# Patient Record
Sex: Male | Born: 1965 | Race: White | Hispanic: No | Marital: Married | Smoking: Current every day smoker
Health system: Southern US, Community
[De-identification: ages and names within clinical notes are randomized; demographics above are authoritative.]

## PROBLEM LIST (undated history)

## (undated) DIAGNOSIS — T8859XA Other complications of anesthesia, initial encounter: Secondary | ICD-10-CM

## (undated) DIAGNOSIS — N186 End stage renal disease: Secondary | ICD-10-CM

## (undated) DIAGNOSIS — Z992 Dependence on renal dialysis: Secondary | ICD-10-CM

## (undated) DIAGNOSIS — K219 Gastro-esophageal reflux disease without esophagitis: Secondary | ICD-10-CM

## (undated) DIAGNOSIS — T4145XA Adverse effect of unspecified anesthetic, initial encounter: Secondary | ICD-10-CM

## (undated) DIAGNOSIS — I82409 Acute embolism and thrombosis of unspecified deep veins of unspecified lower extremity: Secondary | ICD-10-CM

## (undated) HISTORY — PX: OTHER SURGICAL HISTORY: SHX169

---

## 2010-10-16 ENCOUNTER — Inpatient Hospital Stay (HOSPITAL_COMMUNITY)
Admission: EM | Admit: 2010-10-16 | Discharge: 2010-10-21 | DRG: 584 | Disposition: A | Discharge status: Other Acute Inpatient Hospital | Payer: BC Managed Care – PPO | Attending: Internal Medicine | Admitting: Internal Medicine

## 2010-10-16 ENCOUNTER — Emergency Department (HOSPITAL_COMMUNITY): Payer: BC Managed Care – PPO

## 2010-10-16 DIAGNOSIS — L89209 Pressure ulcer of unspecified hip, unspecified stage: Secondary | ICD-10-CM | POA: Diagnosis present

## 2010-10-16 DIAGNOSIS — F3289 Other specified depressive episodes: Secondary | ICD-10-CM | POA: Diagnosis present

## 2010-10-16 DIAGNOSIS — L02219 Cutaneous abscess of trunk, unspecified: Secondary | ICD-10-CM | POA: Diagnosis present

## 2010-10-16 DIAGNOSIS — L8991 Pressure ulcer of unspecified site, stage 1: Secondary | ICD-10-CM | POA: Diagnosis present

## 2010-10-16 DIAGNOSIS — G8929 Other chronic pain: Secondary | ICD-10-CM | POA: Diagnosis present

## 2010-10-16 DIAGNOSIS — Z22322 Carrier or suspected carrier of Methicillin resistant Staphylococcus aureus: Secondary | ICD-10-CM

## 2010-10-16 DIAGNOSIS — K632 Fistula of intestine: Secondary | ICD-10-CM | POA: Diagnosis present

## 2010-10-16 DIAGNOSIS — E875 Hyperkalemia: Secondary | ICD-10-CM | POA: Diagnosis present

## 2010-10-16 DIAGNOSIS — N186 End stage renal disease: Secondary | ICD-10-CM | POA: Diagnosis present

## 2010-10-16 DIAGNOSIS — A4152 Sepsis due to Pseudomonas: Principal | ICD-10-CM | POA: Diagnosis present

## 2010-10-16 DIAGNOSIS — D631 Anemia in chronic kidney disease: Secondary | ICD-10-CM | POA: Diagnosis present

## 2010-10-16 DIAGNOSIS — N039 Chronic nephritic syndrome with unspecified morphologic changes: Secondary | ICD-10-CM | POA: Diagnosis present

## 2010-10-16 DIAGNOSIS — L89309 Pressure ulcer of unspecified buttock, unspecified stage: Secondary | ICD-10-CM | POA: Diagnosis present

## 2010-10-16 DIAGNOSIS — Z992 Dependence on renal dialysis: Secondary | ICD-10-CM

## 2010-10-16 DIAGNOSIS — K5900 Constipation, unspecified: Secondary | ICD-10-CM | POA: Diagnosis present

## 2010-10-16 DIAGNOSIS — G4700 Insomnia, unspecified: Secondary | ICD-10-CM | POA: Diagnosis present

## 2010-10-16 DIAGNOSIS — L8993 Pressure ulcer of unspecified site, stage 3: Secondary | ICD-10-CM | POA: Diagnosis present

## 2010-10-16 DIAGNOSIS — Z9884 Bariatric surgery status: Secondary | ICD-10-CM

## 2010-10-16 DIAGNOSIS — A419 Sepsis, unspecified organism: Secondary | ICD-10-CM | POA: Diagnosis present

## 2010-10-16 DIAGNOSIS — F329 Major depressive disorder, single episode, unspecified: Secondary | ICD-10-CM | POA: Diagnosis present

## 2010-10-16 DIAGNOSIS — E8809 Other disorders of plasma-protein metabolism, not elsewhere classified: Secondary | ICD-10-CM | POA: Diagnosis present

## 2010-10-16 DIAGNOSIS — R5381 Other malaise: Secondary | ICD-10-CM | POA: Diagnosis present

## 2010-10-16 LAB — HEPATIC FUNCTION PANEL
ALT: 5 U/L (ref 0–53)
Albumin: 2.4 g/dL — ABNORMAL LOW (ref 3.5–5.2)
Alkaline Phosphatase: 75 U/L (ref 39–117)
Total Protein: 7.9 g/dL (ref 6.0–8.3)

## 2010-10-16 LAB — DIFFERENTIAL
Basophils Relative: 0 % (ref 0–1)
Lymphocytes Relative: 18 % (ref 12–46)
Monocytes Relative: 8 % (ref 3–12)
Neutro Abs: 5.9 10*3/uL (ref 1.7–7.7)
Neutrophils Relative %: 71 % (ref 43–77)

## 2010-10-16 LAB — BASIC METABOLIC PANEL
CO2: 34 mEq/L — ABNORMAL HIGH (ref 19–32)
Chloride: 99 mEq/L (ref 96–112)
Creatinine, Ser: 5.46 mg/dL — ABNORMAL HIGH (ref 0.4–1.5)
GFR calc Af Amer: 14 mL/min — ABNORMAL LOW (ref >60)
Glucose, Bld: 87 mg/dL (ref 70–99)
Sodium: 139 mEq/L (ref 135–145)

## 2010-10-16 LAB — CBC
HCT: 30.2 % — ABNORMAL LOW (ref 39.0–52.0)
Hemoglobin: 9.2 g/dL — ABNORMAL LOW (ref 13.0–17.0)
MCH: 28.8 pg (ref 26.0–34.0)
RBC: 3.2 MIL/uL — ABNORMAL LOW (ref 4.22–5.81)

## 2010-10-17 ENCOUNTER — Emergency Department (HOSPITAL_COMMUNITY): Payer: BC Managed Care – PPO

## 2010-10-17 LAB — CBC
Platelets: 234 10*3/uL (ref 150–400)
RDW: 15.8 % — ABNORMAL HIGH (ref 11.5–15.5)
WBC: 10 10*3/uL (ref 4.0–10.5)

## 2010-10-17 LAB — RENAL FUNCTION PANEL
BUN: 33 mg/dL — ABNORMAL HIGH (ref 6–23)
Calcium: 9.1 mg/dL (ref 8.4–10.5)
Glucose, Bld: 130 mg/dL — ABNORMAL HIGH (ref 70–99)
Phosphorus: 5.1 mg/dL — ABNORMAL HIGH (ref 2.3–4.6)
Potassium: 5.6 mEq/L — ABNORMAL HIGH (ref 3.5–5.1)

## 2010-10-17 LAB — MRSA PCR SCREENING: MRSA by PCR: POSITIVE — AB

## 2010-10-17 LAB — IRON AND TIBC: TIBC: 154 ug/dL — ABNORMAL LOW (ref 215–435)

## 2010-10-17 LAB — CARDIAC PANEL(CRET KIN+CKTOT+MB+TROPI)
CK, MB: 1.2 ng/mL (ref 0.3–4.0)
Relative Index: INVALID (ref 0.0–2.5)
Total CK: 12 U/L (ref 7–232)
Troponin I: <0.3 ng/mL (ref <0.30)
Troponin I: <0.3 ng/mL (ref <0.30)

## 2010-10-17 LAB — TROPONIN I: Troponin I: <0.3 ng/mL (ref <0.30)

## 2010-10-17 LAB — CK TOTAL AND CKMB (NOT AT ARMC): CK, MB: 1 ng/mL (ref 0.3–4.0)

## 2010-10-18 ENCOUNTER — Inpatient Hospital Stay (HOSPITAL_COMMUNITY): Payer: BC Managed Care – PPO

## 2010-10-18 LAB — RENAL FUNCTION PANEL
Albumin: 1.9 g/dL — ABNORMAL LOW (ref 3.5–5.2)
Chloride: 103 mEq/L (ref 96–112)
GFR calc Af Amer: 11 mL/min — ABNORMAL LOW (ref >60)
GFR calc non Af Amer: 9 mL/min — ABNORMAL LOW (ref >60)
Potassium: 6 mEq/L — ABNORMAL HIGH (ref 3.5–5.1)
Sodium: 139 mEq/L (ref 135–145)

## 2010-10-18 LAB — CBC
MCV: 93.2 fL (ref 78.0–100.0)
Platelets: 248 10*3/uL (ref 150–400)
RBC: 2.93 MIL/uL — ABNORMAL LOW (ref 4.22–5.81)
WBC: 7.6 10*3/uL (ref 4.0–10.5)

## 2010-10-18 LAB — DIFFERENTIAL
Basophils Absolute: 0 10*3/uL (ref 0.0–0.1)
Basophils Relative: 0 % (ref 0–1)
Eosinophils Absolute: 0.3 10*3/uL (ref 0.0–0.7)
Lymphocytes Relative: 15 % (ref 12–46)
Neutrophils Relative %: 72 % (ref 43–77)

## 2010-10-18 LAB — GLUCOSE, CAPILLARY: Glucose-Capillary: 127 mg/dL — ABNORMAL HIGH (ref 70–99)

## 2010-10-18 LAB — IRON: Iron: 20 ug/dL — ABNORMAL LOW (ref 42–135)

## 2010-10-18 LAB — FERRITIN: Ferritin: 1097 ng/mL — ABNORMAL HIGH (ref 22–322)

## 2010-10-18 LAB — HEPATITIS B SURFACE ANTIGEN: Hepatitis B Surface Ag: NEGATIVE

## 2010-10-19 LAB — CBC
MCH: 29.1 pg (ref 26.0–34.0)
MCV: 95.7 fL (ref 78.0–100.0)
Platelets: 221 10*3/uL (ref 150–400)
RBC: 2.78 MIL/uL — ABNORMAL LOW (ref 4.22–5.81)
RDW: 15.6 % — ABNORMAL HIGH (ref 11.5–15.5)
WBC: 7.7 10*3/uL (ref 4.0–10.5)

## 2010-10-19 LAB — RENAL FUNCTION PANEL
Albumin: 2 g/dL — ABNORMAL LOW (ref 3.5–5.2)
BUN: 24 mg/dL — ABNORMAL HIGH (ref 6–23)
Chloride: 103 mEq/L (ref 96–112)
Creatinine, Ser: 5.2 mg/dL — ABNORMAL HIGH (ref 0.4–1.5)
GFR calc Af Amer: 15 mL/min — ABNORMAL LOW (ref >60)
GFR calc non Af Amer: 12 mL/min — ABNORMAL LOW (ref >60)
Phosphorus: 4.4 mg/dL (ref 2.3–4.6)
Potassium: 4.5 mEq/L (ref 3.5–5.1)

## 2010-10-20 ENCOUNTER — Inpatient Hospital Stay (HOSPITAL_COMMUNITY): Payer: BC Managed Care – PPO

## 2010-10-20 DIAGNOSIS — R5381 Other malaise: Secondary | ICD-10-CM

## 2010-10-20 DIAGNOSIS — E669 Obesity, unspecified: Secondary | ICD-10-CM

## 2010-10-20 LAB — RENAL FUNCTION PANEL
Albumin: 2 g/dL — ABNORMAL LOW (ref 3.5–5.2)
Calcium: 9.2 mg/dL (ref 8.4–10.5)
Creatinine, Ser: 6.18 mg/dL — ABNORMAL HIGH (ref 0.4–1.5)
GFR calc Af Amer: 12 mL/min — ABNORMAL LOW (ref >60)
GFR calc non Af Amer: 10 mL/min — ABNORMAL LOW (ref >60)
Phosphorus: 4.5 mg/dL (ref 2.3–4.6)
Sodium: 139 mEq/L (ref 135–145)

## 2010-10-20 LAB — CBC
MCH: 28.5 pg (ref 26.0–34.0)
MCHC: 30.1 g/dL (ref 30.0–36.0)
Platelets: 233 10*3/uL (ref 150–400)
RDW: 15.5 % (ref 11.5–15.5)

## 2010-10-20 LAB — WOUND CULTURE: Gram Stain: NONE SEEN

## 2010-10-21 LAB — TSH: TSH: 1.488 u[IU]/mL (ref 0.350–4.500)

## 2010-10-23 LAB — CULTURE, BLOOD (ROUTINE X 2)

## 2010-10-23 NOTE — Consult Note (Signed)
NAME:  [name], [name] NO.:  [id number]  MEDICAL RECORD NO.:  [id number]           PATIENT TYPE:  I  LOCATION:  2116                         FACILITY:  [facility]  PHYSICIAN:  Maree Krabbe, M.D.DATE OF BIRTH:  Oct 02, 1965  DATE OF CONSULTATION:  10/17/2010 DATE OF DISCHARGE:                                CONSULTATION   REFERRING PHYSICIAN:  Dr. Lovell Sheehan of [facility].  REASON FOR CONSULTATION:  End-stage renal disease and need for ongoing hemodialysis.  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  He was brought to the [facility] emergency department from [facility] facility with complaints of 3-4 days of progressive chest and abdominal pain.  He described the pain as aching, beginning initially in his lower mid chest and epigastric area but subsequently progressing to his right upper quadrant.  Nursing staff at [facility] apparently appreciated increased drainage from his cholecystostomy tube.  He also reports feelings of warmth and diaphoresis for the past day.  He had been started on cefepime on August 15, 2010, for empiric antibiotic coverage.  While in the [facility] emergency department, he spiked a fever of 100.7.  He was admitted for evaluation of abdominal pain in the setting of fever and borderline low blood pressure.  We were consulted to arrange ongoing hemodialysis for this admission.  PAST MEDICAL HISTORY: 1. Morbid obesity status post gastric sleeve surgery in February 2011,     has reportedly lost approximately 400 pounds since that surgery and     current weight is 202  kg. 2. End-stage renal disease on Monday, Wednesday, Friday hemodialysis. 3. 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  Culture in March 2012     positive for Klebsiella. 4. Percutaneous cholecystostomy. 5. Bilateral decubitus ulcers on hips, left worse than right. 6. Chronic pain. 7. Question of diabetes, not currently on diabetic medications.  ALLERGIES:  No known drug allergies.  MEDICATIONS:  While at [facility]): 1. Senna 2 tablets p.o. q.8 hours. 2. Cefepime 500 mg IV q. 24 hours. 3. Hydromorphone 4 mg p.o. q.4 h. p.r.n. for pain. 4. Darbepoetin alpha 200 mcg subcutaneously weekly. 5. Heparin 8000 units IV push three times weekly. 6. Zinc oxide/menthol one topical application daily and q.12 hours as     needed. 7. Tylenol 1000 mg p.o. q.6 h. P.r.n. 8. Gabapentin 300 mg p.o. q.8 hours. 9. Ferrous sulfate 325 mg p.o. q.12 hours. 10.Clonazepam 0.5 mg p.o. q. 6 hours. 11.Diphenhydramine/lidocaine/antacids 5 mL suspension p.o. t.i.d. 12.Miconazole powder one topical application q. 12 hours p.r.n. 13.Methadone 10 mg p.o. q.12 hours. 14.Zofran 4 mg p.o. q.6 h. P.r.n. 15.Albuterol sulfate 2.5 mg inhaled q.6 h. p.r.n. for wheezing. 16.Sevelamer hydrochloride 800 mg p.o. t.i.d. with meals. 17.Nitroglycerin 0.4 mg sublingual q. 5 minutes p.r.n. for chest pain. 18.Aluminium/magnesium/simethicone 30 mL p.o. q.6  h. p.r.n. 19.Lexapro 10 mg p.o. daily. 20.Famotidine 20 mg p.o. b.i.d. 21.Albumin 25 g IV three times weekly p.r.n.  CURRENT INPATIENT MEDICATIONS:  Lovenox, Dilaudid, Avelox, Zosyn and vancomycin.  SOCIAL HISTORY:  The patient has been living in [facility] for approximately the past year following complicated postoperative course from gastric sleeve surgery.  He is married with four children.  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  Has a 20 pack year smoking history but quit 2 years ago.  Denies alcohol at  this time but reports a remote history of heavy alcohol use.  Denies illicit drug use.  FAMILY HISTORY:  Father had diabetes and end-stage renal disease now deceased.  REVIEW OF SYSTEMS:  Positive for fever, sweats, chronic low back pain, chronic weakness in left foot, chest/epigastric/right upper quadrant pain.  Negative for chills, fatigue, headache, vision changes, shortness of breath, orthopnea, cough, nausea, vomiting, diarrhea or mood changes.  PHYSICAL EXAMINATION:  VITAL SIGNS:  T-max 100.7, blood pressure 101/48, heart rate 85, oxygen saturation 97% on 4 liters nasal cannula. GENERAL APPEARANCE:  Morbidly obese.  No acute distress. CARDIOVASCULAR:  Regular rate and rhythm. PULMONARY:  Clear to auscultation bilaterally. ABDOMEN:  Obese, nontender, positive bowel sounds.  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 EXTREMITIES:  Trace edema left forearm AV fistula with palpable thrill and no erythema. SKIN:  Large decubitus ulcer on left hip with deep central depression surrounding it.  The erythema appears chronic.  No drainage or foul odor.  Small decubitus ulcer on right hip also with chronic-appearing erythema but no drainage or foul odor.  LABORATORY DATA:  Sodium 139, potassium 5.6, chloride 100, bicarb 30, BUN 33, creatinine 6.05, glucose 130, albumin 2.0, calcium 9.1, phosphorus 5.1.  White blood cell count 10.0, hemoglobin 8.6, hematocrit 28.2, platelet count 234.  Total bilirubin 0.2, alkaline phosphatase 75, AST 10, ALT 5, total protein 7.9, albumin 2.4, lipase 9.  Lactic acid 1.1.  Cardiac enzymes negative x2.  TSH 1.624.  IMAGING STUDIES: 1. Portable chest x-ray which demonstrated left basilar airspace     opacity which could be due to atelectasis or pneumonia. 2. Abdominal ultrasound which demonstrated gas/debris in the     gallbladder, hepatosplenomegaly, echogenic kidneys, and small  right     pleural effusion.  ASSESSMENT AND PLAN:  A 45 year old man with morbid obesity status post gastric sleeve surgery in February 2011, with complicated postoperative course now end-stage renal disease on hemodialysis admitted with right upper quadrant abdominal pain/chest pain and fever. 1. Right upper quadrant abdominal pain and fever.  The patient does     not currently appear septic.  However, low systolic blood pressure     in the 90s in the emergency department raised this possibility.     Presentation is concerning for intra-abdominal infection, possibly     related to biliary drain site or enterocutaneous fistula.  However,     due to the patient's large body habitus, this may be difficult to     fully assess.  Blood cultures are pending.  Biliary drainage and     enterocutaneous fistula drainage should likely also be cultured.     Cardiac enzymes are being cycled to rule out acute coronary     syndrome as cause of chest pain.  Cardiac enzymes are currently     negative x2.  Continue broad-spectrum antibiotics. 2. Possible pneumonia. CXR  difficult to interpret given large body habitus      but portable film shows possible L basilar opacity. No clinical      findings strongly suggestive of pneumonia. Currently on broad-spectrum      antibiotics as discussed above. Continue to assess clinically.  3. End-stage renal disease on hemodialysis.  We will resume the     patient's Monday, Wednesday, Friday hemodialysis tomorrow via left     forearm arteriovenous fistula. 4. Decubitus ulcers and enterocutaneous fistulas.  Continue wound care. 5. Chronic pain.  On chronic opiates. Currently treated with caution      given borderline low blood pressure. 6. Disposition.  Primary team is apparently considering transfer to     [facility] where the patient underwent his gastric     sleeve surgery following his stabilization.    ______________________________ Whitney Post, MD   ______________________________ Maree Krabbe, M.D.    EB/MEDQ  D:  10/17/2010  T:  10/18/2010  Job:  [id number]  Electronically Signed by Whitney Post MD on 10/21/2010 02:44:00 PM Electronically Signed by Delano Metz M.D. on 10/23/2010 12:53:31 PM

## 2010-10-27 NOTE — Discharge Summary (Signed)
NAME:  MASSIE, MEES NO.:  [id number]  MEDICAL RECORD NO.:  [id number]           PATIENT TYPE:  I  LOCATION:  6704                         FACILITY:  [facility]  PHYSICIAN:  Rosanna Randy, MDDATE OF BIRTH:  1966/03/20  DATE OF ADMISSION:  10/16/2010 DATE OF DISCHARGE:  10/21/2010                              DISCHARGE SUMMARY   DISCHARGE DIAGNOSES: 1. Early sepsis secondary to Pseudomonas infection. 2. Abdominal pain and fever secondary to early sepsis. 3. End-stage renal disease, on hemodialysis Monday, Wednesday, and     Friday. 4. Anemia of chronic disease with a baseline hemoglobin ranging from     7.8-8.5. 5. Chronic enterocutaneous fistula. 6. Stage III left buttocks/leg ulcer. 7. Stage I right leg/hip pressure ulcer. 8. Positive methicillin-resistant Staphylococcus aureus skin infection     by PCR screening. 9. Morbid obesity with a current weight of 202 kg. 10.Status post sleeve gastric surgery in February 2011. 11.Depression. 12.Chronic pain. 13.Insomnia. 14.Hypoalbuminemia. 15.Hyperkalemia.  DISCHARGE MEDICATIONS: 1. Tylenol 325 mg order to take 2 tablets by mouth every 6 hours as     needed. 2. Dulcolax 10 mg rectally as needed for constipation. 3. Calcium 500 mg by mouth every 6 hours as needed. 4. Ciprofloxacin 500 mg by mouth daily for 10 more days. 5. Darbepoetin 200 mcg/cyclosporine 4 mL injection intravenously every     Monday with dialysis. 6. Lovenox 100 mg subcutaneously daily. 7. Famotidine 20 mg by mouth daily. 8. Ferric gluconate 12.5 mg per mL injection order to take 125 mg     intravenously on Monday, Wednesday, and Friday x10 consecutive     doses and then weekly. 9. Dilaudid 2-3 mg intravenously q.3 h. p.r.n. as needed for pain. 10.Vistaril 25 mg by mouth every hours as needed for agitation and     itching. 11.Lexapro 10 mg by mouth daily. 12.Nepro 237 mL by mouth 3 times a day. 13.Zofran 4 mg intravenously every 6  hours as needed for nausea and     vomiting. 14.Oxycodone 10 mg extended release by mouth q.12 h. 15.Roxicodone 5 mg immediate release tablet by mouth every 4 hours as     needed for breakthrough pain. 16.Sarna anti-itching lotion application topically twice a day on his     decubitus ulcers. 17.Senokot 2 tablets by mouth daily. 18.Renagel 800 mg by mouth 3 times a day with meals. 19.Sorbitol 70% 30 mL by mouth as needed. 20.Zolpidem 5 mg by mouth daily at bedtime as needed for insomnia. 21.The patient had been also discharged with IV line receiving sodium     chloride 0.45% through his vein, 500 mL every 25 hours to keep his     veins open and provide access for IV medications.  DISPOSITION AND FOLLOWUP:  The patient after discussing with his primary surgeon, the patient's request and also family wishes specifically power of attorney, the patient's sister, the decision is to transfer the patient to [facility] where Dr. Shane Crutch is going to look over the patient's cholecystostomy status and is going to decide cholecystectomy when appropriate/discharge cholecystostomy tube.  They will also follow on his gastric sleeve surgery  and his abdominal fistula and if needed, they will also have the ability to perform scans on Mr. Krygier, which is one of the main reasons why the patient is going to be transferred over there since due to his weight, we are unable to perform any at this hospital.  The patient is going to finish his antibiotics for his Pseudomonas infection.  At this point, he is actually stable. No further fevers.  Tolerating his meals and just having mild discomfort in his abdomen.  He will also continue his hemodialysis treatment on Monday, Wednesday, and Friday.  PROCEDURES PERFORMED DURING THIS HOSPITALIZATION:  The patient had a portable chest x-ray on Oct 17, 2010 that demonstrated left basilar opacity, most likely secondary to atelectasis.  The patient also  had ultrasound of the abdomen on Oct 17, 2010 that demonstrated gas/decrease in the gallbladder.  A cholecystostomy tube is not discretely visualized, noting evaluation is constrained by acoustic shadowing. Gallbladder wall at the upper limits of normal measuring 4 mm.  There is hepatosplenomegaly.  Heterogeneous hepatic parenchyma.  Echogenic renal parenchyma likely reflecting medical renal disease.  There is a small right pleural effusion.  No other procedures were performed during this hospitalization.  CONSULTATIONS:  The renal team was consulted during this admission in order to continue the patient's hemodialysis treatment.  BRIEF HISTORY OF PRESENT ILLNESS:  Mr. Ratz is a 45 year old male with a history of morbid obesity status post gastric sleeve procedure which had been performed at [facility] 16 months ago.  The patient is currently prior coming into the hospital in the [facility] for rehabilitation therapy.  He had lost according to the records approximately 400 pounds and still weights approximately 400 pounds at this time.  He has had abdominal pain for the past 3-4 days, which had been worsening in nature.  He began to have pain in the chest as well.  He denies having any nausea or vomiting.  He has a biliary tube which had been draining and the drainage was noted per the staff at the [facility] have an increase in the output with a purulent discharge.  The patient had low-grade fevers prior to arrival and in the emergency department, was found to have a fever of 100.7. The emergency department physician evaluated this patient and contacted to [facility] for transfer of the patient after the evaluation.  However, the on-call physician preferred that the patient remain at [facility] until his condition declares itself as sepsis or not before possible transfer to [facility] when the patient's medical  condition is stable.  For further details on his history of present illness and physical exam on admission, please refer to dictation done by Dr. Lovell Sheehan on Oct 17, 2010.  PERTINENT LABORATORY DATA DONE THROUGHOUT THIS HOSPITALIZATION:  Three sets of cardiac markers which had been negative.  CBC on admission that demonstrated a hemoglobin of 8.8 with a white blood cells of 8.3 and platelets 245.  He also has a BMET with a sodium of 139, potassium 5.6, chloride 99, bicarb 34, glucose 87, BUN 30, creatinine 5.46 with a calcium level of 9.4.  Lipase was 9.  The patient had a hepatic function panel, which was pretty much unremarkable except for low albumin which was 2.4, total bilirubin was 0.2, alkaline phosphatase 75, AST 10, ALT 5, and total protein 7.9.  Lactic acid was 1.1. Procalcitonin level was 0.37.  MRSA PCR screening was positive.  TSH was 1.624.  Blood cultures no growth to date.  Total iron was 20.  Ferritin 1097.  Hepatitis B surface antibody was negative.  At discharge; CBC demonstrated white blood cells of 7.7, hemoglobin 8.1, and platelets 221.  Renal function panel demonstrated a sodium of 139, potassium 5.1, chloride 102, bicarb 31, glucose 89, BUN 27, creatinine 6.18, albumin 2.0, calcium 9.2, and phosphorus 4.5.  This was a predialysis blood work.  HOSPITAL COURSE BY PROBLEM: 1. Early sepsis secondary to Pseudomonas infection as we actually have     the wound culture from biliary tube side that demonstrated     Pseudomonas microorganism sensitive to ciprofloxacin.  The patient     received 4 days of IV antibiotics while he was in the hospital with     a transition to Cipro 500 mg by mouth every 24 hours to complete a     total antibiotic course of 14 days.  The patient is planned to     receive 10 more days of antibiotics by mouth.  At this point, plan     is to transfer the patient to [facility] since his condition     is stable in order to look over his  cholecystostomy tube and decide     cholecystectomy versus removal of his cholecystostomy tube. 2. The patient's abdominal pain and fever secondary to early sepsis.     At this point, no further fever has been appreciated.  Vital signs     remained stable.  Blood pressure also remained stable.  The     patient's abdominal pain has decreased considerably and at this     point is just minimally. 3. End-stage renal disease.  Plan is to continue hemodialysis on     Monday, Wednesday, and Friday. 4. Anemia of chronic disease secondary to his end-stage renal disease.     At this point, the plan is to continue using Aranesp and also we     are going to provide loading doses of IV iron with weekly doses of     IV iron with hemodialysis after. 5. The patient's chronic enterocutaneous fistula which had remained     stable and did not present any drainage at this point. 6. The patient's stage III buttocks/left leg ulcer.  Plan is to     continue with wound care and prevention measures. 7. Positive MRSA infection by PCR.  The patient received treatment     with chlorhexidine and also mupirocin while he was in the hospital. 8. Morbid obesity.  Plan is to continue the patient on his current     diet and he is status post gastric sleeve surgery.  This is     something that will be looked over again by Dr. Shane Crutch once the     patient is at [facility]. 9. The patient's depression.  We are going to continue Lexapro by     mouth daily. 10.The patient's chronic pain.  We are going to continue current pain     regimen which had been to provide good relief of his pain. 11.The patient's insomnia.  We are going to use p.r.n. zolpidem. 12.The patient's hypoalbuminemia.  We are going to continue using     Nepro, especially to try to help with the wound healing process.  At the moment of discharge, the patient's vital signs; temperature 98.2, heart rate 88, respiratory rate 20, blood pressure 106/55,  and oxygen saturation 93% on 2 L.     Arelia Sneddon  Gwenlyn Perking, MD     CEM/MEDQ  D:  10/21/2010  T:  10/21/2010  Job:  [id number]  cc:   Dr. Shane Crutch  Electronically Signed by Vassie Loll MD on 10/27/2010 01:27:23 PM

## 2010-11-04 ENCOUNTER — Inpatient Hospital Stay
Admission: AD | Admit: 2010-11-04 | Discharge: 2010-12-08 | Disposition: A | Discharge status: Home Health | Payer: Self-pay | Source: Ambulatory Visit | Attending: Internal Medicine | Admitting: Internal Medicine

## 2010-11-04 HISTORY — DX: Gastro-esophageal reflux disease without esophagitis: K21.9

## 2010-11-04 HISTORY — DX: Acute embolism and thrombosis of unspecified deep veins of unspecified lower extremity: I82.409

## 2010-11-05 ENCOUNTER — Other Ambulatory Visit (HOSPITAL_COMMUNITY): Payer: Medicare Other | Attending: Internal Medicine

## 2010-11-05 LAB — CBC
MCH: 27.7 pg (ref 26.0–34.0)
MCHC: 29.9 g/dL — ABNORMAL LOW (ref 30.0–36.0)
MCV: 92.6 fL (ref 78.0–100.0)
Platelets: 238 10*3/uL (ref 150–400)
RBC: 3.25 MIL/uL — ABNORMAL LOW (ref 4.22–5.81)
RDW: 15.2 % (ref 11.5–15.5)

## 2010-11-05 LAB — COMPREHENSIVE METABOLIC PANEL
Albumin: 2.2 g/dL — ABNORMAL LOW (ref 3.5–5.2)
BUN: 12 mg/dL (ref 6–23)
Calcium: 9.2 mg/dL (ref 8.4–10.5)
Chloride: 103 mEq/L (ref 96–112)
Creatinine, Ser: 4.06 mg/dL — ABNORMAL HIGH (ref 0.4–1.5)
Total Bilirubin: 0.2 mg/dL — ABNORMAL LOW (ref 0.3–1.2)

## 2010-11-05 LAB — MAGNESIUM: Magnesium: 2.1 mg/dL (ref 1.5–2.5)

## 2010-11-05 LAB — PREALBUMIN: Prealbumin: 11.4 mg/dL — ABNORMAL LOW (ref 17.0–34.0)

## 2010-11-06 NOTE — H&P (Signed)
[name], [name] NO.:  [id number]  MEDICAL RECORD NO.:  [id number]           PATIENT TYPE:  I  LOCATION:  2116                         FACILITY:  [facility]  PHYSICIAN:  Della Goo, M.D. DATE OF BIRTH:  1965-11-13  DATE OF ADMISSION:  10/16/2010 DATE OF DISCHARGE:                             HISTORY & PHYSICAL   PRIMARY CARE PHYSICIAN:  Unassigned.  CHIEF COMPLAINT:  Abdominal pain.  HISTORY OF PRESENT ILLNESS:  This is a 45 year old male with a history of morbid obesity, status post gastric sleeve procedure, which had been performed at [facility]  16 months ago.  The patient is currently an inpatient at the [facility] for rehabilitation therapy.  He has lost more than 400 pounds and weighs approximately 400 pounds at this time.  He has had abdominal pain for the past 3-4 days, which has been worsening.  He began to have pain in the chest as well.  He denies having any nausea or vomiting.  He has a biliary tube, which has been draining and the drainage was noted by staff as having an increase in the output with purulence.  The patient had no fevers prior to arrival in the emergency department; however, he did develop fever to 100.7 in the Emergency Department at [facility].  The emergency department physician evaluated this patient and contacted the [facility] for transfer of the patient after the evaluation.  However, the on-call physician prefer that the patient remain at [facility] until his condition declares itself as sepsis or not before possible transfer to [facility] in the morning.  PAST MEDICAL HISTORY:  History of end-stage renal disease on hemodialysis treatments, morbid obesity.  PAST SURGICAL HISTORY:  History of the gastric sleep procedure and status post biliary drain placement.  MEDICATIONS:  None.  ALLERGIES:  No Known Drug Allergies.  SOCIAL HISTORY:  The  patient is married with 4 kids.  He is a nonsmoker, nondrinker.  No history of illicit drug usage.  FAMILY HISTORY:  Noncontributory.  REVIEW OF SYSTEMS:  Pertinent as mentioned above.  PHYSICAL EXAMINATION:  GENERAL:  This is a morbidly obese 45 year old Caucasian male who is in discomfort but no acute distress. VITAL SIGNS:  Temperature max of 100.7, blood pressure 143/71, heart rate 88, respirations 22, O2 saturations 91-98%. HEENT:  Normocephalic, atraumatic.  Pupils equally round and reactive to light.  Extraocular movements are intact, funduscopic benign.  There is no scleral icterus.  Nares are patent bilaterally.  Oropharynx is clear. NECK:  Supple, full range of motion.  No thyromegaly, adenopathy, jugular venous distention. CARDIOVASCULAR:  Tachycardic rhythm.  No murmurs, gallops or rubs appreciated. LUNGS:  Clear to auscultation bilaterally.  No rales, rhonchi or wheezes. ABDOMEN:  Positive bowel sounds, obese, soft, nontender, nondistended. Biliary drain present in the right upper quadrant area.  There is some oozing around the tubing at the site. EXTREMITIES:  Obese.  There is no cyanosis, clubbing or edema. NEUROLOGIC:  Generalized weakness but otherwise no focal deficits.  LABORATORY STUDIES:  White blood cell count 8.3, hemoglobin 9.2, hematocrit 30.2,  MCV 94.4, platelets 245, neutrophils 71%, lymphocytes 18%.  Sodium 139, potassium 5.6, chloride 99, CO2 34, BUN 30, creatinine 5.46 and glucose 87.  Lipase 9, albumin 2.4, AST 10, ALT 5, lactic acid level 1.1.  Abdominal ultrasound study reveals the indwelling cholecystostomy tube, debris in the gallbladder, gas within the gallbladder due to the biliary tube, gallbladder wall at the upper limits of normal.  Liver is noted as having heterogeneous parenchyma and enlarged.  Pancreas is poorly enlarged.  Spleen is enlarged and left chest x-ray reveals left basilar air space opacity.  ASSESSMENT:  A 45 year old male  being admitted with: 1. Pneumonia. 2. Right upper quadrant abdominal pain. 3. Cellulitis around the right upper quadrant biliary tube site. 4. Chest pain. 5. End-stage renal disease on hemodialysis treatments. 6. Hyperkalemia. 7. Morbid obesity.  PLAN:  The patient will be admitted to step-down ICU.  Cardiac enzymes will be performed.  Blood cultures x2 have also been ordered and a culture of the biliary tube site will also be sent.  The patient has been placed on IV antibiotic therapy of vancomycin, Zosyn and Avelox at this time.  For the elevated potassium level at 5.6, the patient will be administered Kayexalate 15 g p.o. x1 dose.  Gentle IV fluids have also been ordered, and the patient will be placed on DVT prophylaxis.  The dialysis team will be notified of the patient's admission to continue the patient's dialysis treatments.  The patient is a full code.  A phone number was given from the on-call physician at  [facility]  to contact if the patient will need to be transferred to [facility].  The phone number is are code [phone].     Della Goo, M.D.     HJ/MEDQ  D:  10/17/2010  T:  10/17/2010  Job:  [id number]  Electronically Signed by Della Goo M.D. on 11/06/2010 06:21:47 AM

## 2010-11-08 LAB — RENAL FUNCTION PANEL
Albumin: 2.1 g/dL — ABNORMAL LOW (ref 3.5–5.2)
GFR calc Af Amer: 15 mL/min — ABNORMAL LOW (ref >60)
Glucose, Bld: 85 mg/dL (ref 70–99)
Phosphorus: 2.8 mg/dL (ref 2.3–4.6)
Potassium: 3.9 mEq/L (ref 3.5–5.1)
Sodium: 136 mEq/L (ref 135–145)

## 2010-11-08 LAB — CBC
Hemoglobin: 9.2 g/dL — ABNORMAL LOW (ref 13.0–17.0)
MCHC: 30.5 g/dL (ref 30.0–36.0)
RDW: 15.2 % (ref 11.5–15.5)
WBC: 9.1 10*3/uL (ref 4.0–10.5)

## 2010-11-08 LAB — IRON AND TIBC: Iron: 20 ug/dL — ABNORMAL LOW (ref 42–135)

## 2010-11-11 LAB — CBC
Hemoglobin: 9 g/dL — ABNORMAL LOW (ref 13.0–17.0)
MCH: 27.4 pg (ref 26.0–34.0)
MCV: 88.4 fL (ref 78.0–100.0)
RBC: 3.29 MIL/uL — ABNORMAL LOW (ref 4.22–5.81)

## 2010-11-11 LAB — RENAL FUNCTION PANEL
CO2: 31 mEq/L (ref 19–32)
Calcium: 9 mg/dL (ref 8.4–10.5)
Creatinine, Ser: 4.69 mg/dL — ABNORMAL HIGH (ref 0.4–1.5)
GFR calc Af Amer: 17 mL/min — ABNORMAL LOW (ref >60)
Glucose, Bld: 79 mg/dL (ref 70–99)

## 2010-11-12 LAB — RENAL FUNCTION PANEL
CO2: 33 mEq/L — ABNORMAL HIGH (ref 19–32)
GFR calc Af Amer: 16 mL/min — ABNORMAL LOW (ref >60)
Glucose, Bld: 81 mg/dL (ref 70–99)
Phosphorus: 2.7 mg/dL (ref 2.3–4.6)
Potassium: 4.8 mEq/L (ref 3.5–5.1)
Sodium: 135 mEq/L (ref 135–145)

## 2010-11-12 LAB — CBC
HCT: 29.6 % — ABNORMAL LOW (ref 39.0–52.0)
Hemoglobin: 8.9 g/dL — ABNORMAL LOW (ref 13.0–17.0)
MCH: 26.8 pg (ref 26.0–34.0)
MCHC: 30.1 g/dL (ref 30.0–36.0)

## 2010-11-15 LAB — RENAL FUNCTION PANEL
Albumin: 2.1 g/dL — ABNORMAL LOW (ref 3.5–5.2)
BUN: 38 mg/dL — ABNORMAL HIGH (ref 6–23)
Chloride: 98 mEq/L (ref 96–112)
GFR calc Af Amer: 12 mL/min — ABNORMAL LOW (ref >60)
Potassium: 5 mEq/L (ref 3.5–5.1)
Sodium: 136 mEq/L (ref 135–145)

## 2010-11-15 LAB — CBC
HCT: 30 % — ABNORMAL LOW (ref 39.0–52.0)
Hemoglobin: 9 g/dL — ABNORMAL LOW (ref 13.0–17.0)
RDW: 15.1 % (ref 11.5–15.5)
WBC: 7.3 10*3/uL (ref 4.0–10.5)

## 2010-11-17 LAB — CBC
MCV: 87.6 fL (ref 78.0–100.0)
Platelets: 272 10*3/uL (ref 150–400)
RBC: 3.4 MIL/uL — ABNORMAL LOW (ref 4.22–5.81)
WBC: 7.2 10*3/uL (ref 4.0–10.5)

## 2010-11-17 LAB — RENAL FUNCTION PANEL
Albumin: 2.4 g/dL — ABNORMAL LOW (ref 3.5–5.2)
CO2: 32 mEq/L (ref 19–32)
Chloride: 103 mEq/L (ref 96–112)
Creatinine, Ser: 6 mg/dL — ABNORMAL HIGH (ref 0.50–1.35)
GFR calc Af Amer: 13 mL/min — ABNORMAL LOW (ref >60)
GFR calc non Af Amer: 10 mL/min — ABNORMAL LOW (ref >60)
Potassium: 4.2 mEq/L (ref 3.5–5.1)
Sodium: 141 mEq/L (ref 135–145)

## 2010-11-18 ENCOUNTER — Other Ambulatory Visit (HOSPITAL_COMMUNITY): Payer: Medicare Other

## 2010-11-18 ENCOUNTER — Encounter: Payer: Self-pay | Admitting: Radiology

## 2010-11-18 ENCOUNTER — Other Ambulatory Visit (HOSPITAL_COMMUNITY): Payer: Medicare Other | Attending: Internal Medicine

## 2010-11-19 DIAGNOSIS — F4320 Adjustment disorder, unspecified: Secondary | ICD-10-CM

## 2010-11-19 LAB — CBC
Hemoglobin: 9.2 g/dL — ABNORMAL LOW (ref 13.0–17.0)
Platelets: 243 10*3/uL (ref 150–400)
RBC: 3.4 MIL/uL — ABNORMAL LOW (ref 4.22–5.81)
WBC: 7.3 10*3/uL (ref 4.0–10.5)

## 2010-11-19 LAB — RENAL FUNCTION PANEL
CO2: 35 mEq/L — ABNORMAL HIGH (ref 19–32)
Chloride: 97 mEq/L (ref 96–112)
GFR calc Af Amer: 13 mL/min — ABNORMAL LOW (ref >60)
GFR calc non Af Amer: 11 mL/min — ABNORMAL LOW (ref >60)
Glucose, Bld: 80 mg/dL (ref 70–99)
Potassium: 4.1 mEq/L (ref 3.5–5.1)
Sodium: 137 mEq/L (ref 135–145)

## 2010-11-22 LAB — RENAL FUNCTION PANEL
Albumin: 2.5 g/dL — ABNORMAL LOW (ref 3.5–5.2)
BUN: 33 mg/dL — ABNORMAL HIGH (ref 6–23)
Chloride: 96 mEq/L (ref 96–112)
GFR calc Af Amer: 12 mL/min — ABNORMAL LOW (ref >60)
Glucose, Bld: 159 mg/dL — ABNORMAL HIGH (ref 70–99)
Phosphorus: 3.3 mg/dL (ref 2.3–4.6)
Potassium: 4.2 mEq/L (ref 3.5–5.1)
Sodium: 135 mEq/L (ref 135–145)

## 2010-11-22 LAB — CBC
HCT: 31.4 % — ABNORMAL LOW (ref 39.0–52.0)
Hemoglobin: 9.9 g/dL — ABNORMAL LOW (ref 13.0–17.0)
MCHC: 31.5 g/dL (ref 30.0–36.0)
RDW: 15.5 % (ref 11.5–15.5)
WBC: 7.6 10*3/uL (ref 4.0–10.5)

## 2010-11-22 LAB — FERRITIN: Ferritin: 608 ng/mL — ABNORMAL HIGH (ref 22–322)

## 2010-11-22 LAB — IRON AND TIBC
Iron: 36 ug/dL — ABNORMAL LOW (ref 42–135)
TIBC: 165 ug/dL — ABNORMAL LOW (ref 215–435)

## 2010-11-24 LAB — PTH-RELATED PEPTIDE

## 2010-11-24 LAB — RENAL FUNCTION PANEL
BUN: 38 mg/dL — ABNORMAL HIGH (ref 6–23)
CO2: 32 mEq/L (ref 19–32)
Calcium: 9.6 mg/dL (ref 8.4–10.5)
Creatinine, Ser: 6.79 mg/dL — ABNORMAL HIGH (ref 0.50–1.35)
Glucose, Bld: 82 mg/dL (ref 70–99)
Phosphorus: 3.2 mg/dL (ref 2.3–4.6)

## 2010-11-24 LAB — CBC
Hemoglobin: 9.4 g/dL — ABNORMAL LOW (ref 13.0–17.0)
MCH: 27.2 pg (ref 26.0–34.0)
MCHC: 31.2 g/dL (ref 30.0–36.0)
MCV: 87.2 fL (ref 78.0–100.0)
RBC: 3.45 MIL/uL — ABNORMAL LOW (ref 4.22–5.81)

## 2010-11-24 LAB — CROSSMATCH: Antibody Screen: NEGATIVE

## 2010-11-24 LAB — ABO/RH: ABO/RH(D): O NEG

## 2010-11-25 ENCOUNTER — Inpatient Hospital Stay (HOSPITAL_COMMUNITY)
Admit: 2010-11-25 | Discharge: 2010-11-25 | Disposition: A | Discharge status: Home / Self Care | Payer: Self-pay | Attending: Internal Medicine | Admitting: Internal Medicine

## 2010-11-25 DIAGNOSIS — M79609 Pain in unspecified limb: Secondary | ICD-10-CM

## 2010-11-25 LAB — HEPATITIS B SURFACE ANTIGEN: Hepatitis B Surface Ag: NEGATIVE

## 2010-11-25 LAB — HEPATITIS B SURFACE ANTIBODY,QUALITATIVE: Hep B S Ab: NEGATIVE

## 2010-11-26 ENCOUNTER — Other Ambulatory Visit (HOSPITAL_COMMUNITY): Payer: Medicare Other | Attending: Internal Medicine

## 2010-11-26 ENCOUNTER — Other Ambulatory Visit (HOSPITAL_COMMUNITY): Payer: Medicare Other

## 2010-11-26 LAB — RENAL FUNCTION PANEL
Albumin: 2.5 g/dL — ABNORMAL LOW (ref 3.5–5.2)
BUN: 32 mg/dL — ABNORMAL HIGH (ref 6–23)
Calcium: 9.2 mg/dL (ref 8.4–10.5)
Creatinine, Ser: 6.48 mg/dL — ABNORMAL HIGH (ref 0.50–1.35)
Phosphorus: 2.7 mg/dL (ref 2.3–4.6)
Potassium: 4.2 mEq/L (ref 3.5–5.1)

## 2010-11-26 LAB — CBC
HCT: 27.2 % — ABNORMAL LOW (ref 39.0–52.0)
MCH: 27.1 pg (ref 26.0–34.0)
MCHC: 31.6 g/dL (ref 30.0–36.0)
MCV: 85.8 fL (ref 78.0–100.0)
RDW: 16.1 % — ABNORMAL HIGH (ref 11.5–15.5)

## 2010-11-27 ENCOUNTER — Other Ambulatory Visit (HOSPITAL_COMMUNITY): Payer: Medicare Other | Attending: Internal Medicine

## 2010-11-27 LAB — CBC
Hemoglobin: 9.3 g/dL — ABNORMAL LOW (ref 13.0–17.0)
MCH: 26.6 pg (ref 26.0–34.0)
MCV: 86.3 fL (ref 78.0–100.0)
RBC: 3.5 MIL/uL — ABNORMAL LOW (ref 4.22–5.81)
WBC: 10.7 10*3/uL — ABNORMAL HIGH (ref 4.0–10.5)

## 2010-11-27 LAB — DIFFERENTIAL
Lymphs Abs: 1.9 10*3/uL (ref 0.7–4.0)
Monocytes Relative: 6 % (ref 3–12)
Neutro Abs: 7.9 10*3/uL — ABNORMAL HIGH (ref 1.7–7.7)
Neutrophils Relative %: 74 % (ref 43–77)

## 2010-11-27 LAB — URINALYSIS, ROUTINE W REFLEX MICROSCOPIC
Glucose, UA: NEGATIVE mg/dL
Hgb urine dipstick: NEGATIVE
Ketones, ur: 15 mg/dL — AB
Protein, ur: 100 mg/dL — AB

## 2010-11-27 LAB — URINE MICROSCOPIC-ADD ON

## 2010-11-28 LAB — COMPREHENSIVE METABOLIC PANEL
ALT: 104 U/L — ABNORMAL HIGH (ref 0–53)
AST: 71 U/L — ABNORMAL HIGH (ref 0–37)
Calcium: 9.4 mg/dL (ref 8.4–10.5)
Chloride: 93 mEq/L — ABNORMAL LOW (ref 96–112)
GFR calc non Af Amer: 10 mL/min — ABNORMAL LOW (ref >60)
Glucose, Bld: 93 mg/dL (ref 70–99)
Potassium: 4.2 mEq/L (ref 3.5–5.1)
Total Protein: 8.2 g/dL (ref 6.0–8.3)

## 2010-11-28 LAB — CBC
HCT: 29.5 % — ABNORMAL LOW (ref 39.0–52.0)
Hemoglobin: 9.1 g/dL — ABNORMAL LOW (ref 13.0–17.0)
MCHC: 30.8 g/dL (ref 30.0–36.0)
MCV: 85.5 fL (ref 78.0–100.0)
RDW: 16.3 % — ABNORMAL HIGH (ref 11.5–15.5)
WBC: 9.8 10*3/uL (ref 4.0–10.5)

## 2010-11-29 LAB — RENAL FUNCTION PANEL
Albumin: 2.5 g/dL — ABNORMAL LOW (ref 3.5–5.2)
GFR calc Af Amer: 10 mL/min — ABNORMAL LOW (ref >60)
Phosphorus: 3.7 mg/dL (ref 2.3–4.6)
Potassium: 4.5 mEq/L (ref 3.5–5.1)
Sodium: 132 mEq/L — ABNORMAL LOW (ref 135–145)

## 2010-11-29 LAB — CBC
MCHC: 31.8 g/dL (ref 30.0–36.0)
RDW: 16.4 % — ABNORMAL HIGH (ref 11.5–15.5)

## 2010-11-30 LAB — HEPATITIS B SURFACE ANTIGEN: Hepatitis B Surface Ag: NEGATIVE

## 2010-12-01 LAB — CBC
HCT: 27.9 % — ABNORMAL LOW (ref 39.0–52.0)
MCHC: 30.5 g/dL (ref 30.0–36.0)
RDW: 16.4 % — ABNORMAL HIGH (ref 11.5–15.5)

## 2010-12-01 LAB — URINE CULTURE
Culture  Setup Time: 201206301148
Special Requests: NEGATIVE

## 2010-12-01 LAB — RENAL FUNCTION PANEL
Albumin: 2.3 g/dL — ABNORMAL LOW (ref 3.5–5.2)
BUN: 37 mg/dL — ABNORMAL HIGH (ref 6–23)
Calcium: 9.3 mg/dL (ref 8.4–10.5)
Phosphorus: 2.6 mg/dL (ref 2.3–4.6)
Potassium: 4.4 mEq/L (ref 3.5–5.1)
Sodium: 136 mEq/L (ref 135–145)

## 2010-12-03 LAB — RENAL FUNCTION PANEL
Albumin: 2.2 g/dL — ABNORMAL LOW (ref 3.5–5.2)
Calcium: 9.3 mg/dL (ref 8.4–10.5)
Chloride: 97 mEq/L (ref 96–112)
Creatinine, Ser: 5.42 mg/dL — ABNORMAL HIGH (ref 0.50–1.35)
GFR calc non Af Amer: 12 mL/min — ABNORMAL LOW (ref >60)
Phosphorus: 2.1 mg/dL — ABNORMAL LOW (ref 2.3–4.6)

## 2010-12-03 LAB — CBC
MCHC: 30.5 g/dL (ref 30.0–36.0)
MCV: 87.2 fL (ref 78.0–100.0)
Platelets: 231 10*3/uL (ref 150–400)
RDW: 16.3 % — ABNORMAL HIGH (ref 11.5–15.5)
WBC: 7.9 10*3/uL (ref 4.0–10.5)

## 2010-12-04 ENCOUNTER — Other Ambulatory Visit (HOSPITAL_COMMUNITY): Payer: Medicare Other | Attending: Internal Medicine

## 2010-12-06 LAB — RENAL FUNCTION PANEL
Albumin: 2.2 g/dL — ABNORMAL LOW (ref 3.5–5.2)
Calcium: 9.5 mg/dL (ref 8.4–10.5)
Creatinine, Ser: 6.15 mg/dL — ABNORMAL HIGH (ref 0.50–1.35)
GFR calc Af Amer: 12 mL/min — ABNORMAL LOW (ref >60)
GFR calc non Af Amer: 10 mL/min — ABNORMAL LOW (ref >60)
Phosphorus: 2.9 mg/dL (ref 2.3–4.6)
Sodium: 135 mEq/L (ref 135–145)

## 2010-12-06 LAB — CBC
MCH: 26.7 pg (ref 26.0–34.0)
MCHC: 30.7 g/dL (ref 30.0–36.0)
Platelets: 280 10*3/uL (ref 150–400)
RBC: 3.03 MIL/uL — ABNORMAL LOW (ref 4.22–5.81)
RDW: 16.4 % — ABNORMAL HIGH (ref 11.5–15.5)

## 2010-12-07 ENCOUNTER — Institutional Professional Consult (permissible substitution) (HOSPITAL_COMMUNITY): Payer: Medicare Other

## 2010-12-07 LAB — COMPREHENSIVE METABOLIC PANEL
AST: 9 U/L (ref 0–37)
Albumin: 2.2 g/dL — ABNORMAL LOW (ref 3.5–5.2)
BUN: 23 mg/dL (ref 6–23)
Calcium: 9.1 mg/dL (ref 8.4–10.5)
Creatinine, Ser: 4.18 mg/dL — ABNORMAL HIGH (ref 0.50–1.35)
Total Protein: 7.6 g/dL (ref 6.0–8.3)

## 2010-12-07 LAB — CBC
HCT: 27 % — ABNORMAL LOW (ref 39.0–52.0)
MCH: 26.8 pg (ref 26.0–34.0)
MCV: 86 fL (ref 78.0–100.0)
Platelets: 270 10*3/uL (ref 150–400)
RDW: 16.6 % — ABNORMAL HIGH (ref 11.5–15.5)

## 2010-12-07 LAB — PROTIME-INR
INR: 1.16 (ref 0.00–1.49)
Prothrombin Time: 15 seconds (ref 11.6–15.2)

## 2010-12-07 LAB — APTT: aPTT: 37 seconds (ref 24–37)

## 2010-12-07 MED ORDER — IOHEXOL 300 MG/ML  SOLN: 12.0000 mL | Freq: Once | INTRAMUSCULAR | Status: DC | PRN

## 2010-12-07 MED ORDER — IOHEXOL 300 MG/ML  SOLN: 30.0000 mL | Freq: Once | INTRAMUSCULAR | Status: AC | PRN

## 2010-12-08 LAB — RENAL FUNCTION PANEL
CO2: 32 mEq/L (ref 19–32)
Calcium: 9.4 mg/dL (ref 8.4–10.5)
Chloride: 94 mEq/L — ABNORMAL LOW (ref 96–112)
Creatinine, Ser: 5.39 mg/dL — ABNORMAL HIGH (ref 0.50–1.35)
GFR calc Af Amer: 14 mL/min — ABNORMAL LOW (ref >60)
GFR calc non Af Amer: 12 mL/min — ABNORMAL LOW (ref >60)
Glucose, Bld: 91 mg/dL (ref 70–99)
Sodium: 135 mEq/L (ref 135–145)

## 2010-12-08 LAB — CBC
Hemoglobin: 8.2 g/dL — ABNORMAL LOW (ref 13.0–17.0)
MCH: 26.5 pg (ref 26.0–34.0)
MCV: 86.4 fL (ref 78.0–100.0)
RBC: 3.09 MIL/uL — ABNORMAL LOW (ref 4.22–5.81)
WBC: 8.1 10*3/uL (ref 4.0–10.5)

## 2011-08-24 ENCOUNTER — Encounter (HOSPITAL_BASED_OUTPATIENT_CLINIC_OR_DEPARTMENT_OTHER): Payer: Self-pay | Admitting: Family Medicine

## 2011-08-24 ENCOUNTER — Emergency Department (INDEPENDENT_AMBULATORY_CARE_PROVIDER_SITE_OTHER): Payer: BC Managed Care – PPO

## 2011-08-24 ENCOUNTER — Inpatient Hospital Stay (HOSPITAL_BASED_OUTPATIENT_CLINIC_OR_DEPARTMENT_OTHER)
Admission: EM | Admit: 2011-08-24 | Discharge: 2011-09-01 | DRG: 210 | Payer: BC Managed Care – PPO | Attending: Internal Medicine | Admitting: Internal Medicine

## 2011-08-24 DIAGNOSIS — N186 End stage renal disease: Secondary | ICD-10-CM

## 2011-08-24 DIAGNOSIS — K219 Gastro-esophageal reflux disease without esophagitis: Secondary | ICD-10-CM | POA: Diagnosis present

## 2011-08-24 DIAGNOSIS — N2581 Secondary hyperparathyroidism of renal origin: Secondary | ICD-10-CM | POA: Diagnosis present

## 2011-08-24 DIAGNOSIS — Z992 Dependence on renal dialysis: Secondary | ICD-10-CM

## 2011-08-24 DIAGNOSIS — Y9301 Activity, walking, marching and hiking: Secondary | ICD-10-CM

## 2011-08-24 DIAGNOSIS — I10 Essential (primary) hypertension: Secondary | ICD-10-CM | POA: Diagnosis present

## 2011-08-24 DIAGNOSIS — S72143A Displaced intertrochanteric fracture of unspecified femur, initial encounter for closed fracture: Principal | ICD-10-CM | POA: Diagnosis present

## 2011-08-24 DIAGNOSIS — G4733 Obstructive sleep apnea (adult) (pediatric): Secondary | ICD-10-CM | POA: Diagnosis present

## 2011-08-24 DIAGNOSIS — D631 Anemia in chronic kidney disease: Secondary | ICD-10-CM | POA: Diagnosis present

## 2011-08-24 DIAGNOSIS — I120 Hypertensive chronic kidney disease with stage 5 chronic kidney disease or end stage renal disease: Secondary | ICD-10-CM | POA: Diagnosis present

## 2011-08-24 DIAGNOSIS — J9819 Other pulmonary collapse: Secondary | ICD-10-CM

## 2011-08-24 DIAGNOSIS — J811 Chronic pulmonary edema: Secondary | ICD-10-CM

## 2011-08-24 DIAGNOSIS — I82409 Acute embolism and thrombosis of unspecified deep veins of unspecified lower extremity: Secondary | ICD-10-CM | POA: Diagnosis present

## 2011-08-24 DIAGNOSIS — W19XXXA Unspecified fall, initial encounter: Secondary | ICD-10-CM

## 2011-08-24 DIAGNOSIS — M216X9 Other acquired deformities of unspecified foot: Secondary | ICD-10-CM | POA: Diagnosis present

## 2011-08-24 DIAGNOSIS — Z7901 Long term (current) use of anticoagulants: Secondary | ICD-10-CM

## 2011-08-24 DIAGNOSIS — M899 Disorder of bone, unspecified: Secondary | ICD-10-CM | POA: Diagnosis present

## 2011-08-24 DIAGNOSIS — Z01818 Encounter for other preprocedural examination: Secondary | ICD-10-CM

## 2011-08-24 DIAGNOSIS — Y998 Other external cause status: Secondary | ICD-10-CM

## 2011-08-24 DIAGNOSIS — Z6841 Body Mass Index (BMI) 40.0 and over, adult: Secondary | ICD-10-CM

## 2011-08-24 DIAGNOSIS — I82509 Chronic embolism and thrombosis of unspecified deep veins of unspecified lower extremity: Secondary | ICD-10-CM | POA: Diagnosis present

## 2011-08-24 DIAGNOSIS — E119 Type 2 diabetes mellitus without complications: Secondary | ICD-10-CM | POA: Diagnosis present

## 2011-08-24 DIAGNOSIS — N19 Unspecified kidney failure: Secondary | ICD-10-CM

## 2011-08-24 DIAGNOSIS — D62 Acute posthemorrhagic anemia: Secondary | ICD-10-CM | POA: Diagnosis not present

## 2011-08-24 HISTORY — DX: Adverse effect of unspecified anesthetic, initial encounter: T41.45XA

## 2011-08-24 HISTORY — DX: End stage renal disease: N18.6

## 2011-08-24 HISTORY — DX: Other complications of anesthesia, initial encounter: T88.59XA

## 2011-08-24 LAB — DIFFERENTIAL
Band Neutrophils: 4 % (ref 0–10)
Basophils Absolute: 0 10*3/uL (ref 0.0–0.1)
Basophils Relative: 0 % (ref 0–1)
Eosinophils Absolute: 0 10*3/uL (ref 0.0–0.7)
Eosinophils Relative: 0 % (ref 0–5)
Lymphocytes Relative: 13 % (ref 12–46)
Lymphs Abs: 1.5 10*3/uL (ref 0.7–4.0)
Monocytes Absolute: 0.7 10*3/uL (ref 0.1–1.0)
Neutro Abs: 9 10*3/uL — ABNORMAL HIGH (ref 1.7–7.7)
Neutrophils Relative %: 74 % (ref 43–77)
Promyelocytes Absolute: 0 %

## 2011-08-24 LAB — MRSA PCR SCREENING: MRSA by PCR: NEGATIVE

## 2011-08-24 LAB — BASIC METABOLIC PANEL
BUN: 39 mg/dL — ABNORMAL HIGH (ref 6–23)
Calcium: 9.5 mg/dL (ref 8.4–10.5)
Chloride: 97 mEq/L (ref 96–112)
Creatinine, Ser: 4.6 mg/dL — ABNORMAL HIGH (ref 0.50–1.35)
GFR calc Af Amer: 17 mL/min — ABNORMAL LOW (ref >90)
GFR calc non Af Amer: 14 mL/min — ABNORMAL LOW (ref >90)

## 2011-08-24 LAB — CBC
HCT: 36.9 % — ABNORMAL LOW (ref 39.0–52.0)
Hemoglobin: 12 g/dL — ABNORMAL LOW (ref 13.0–17.0)
MCHC: 32.5 g/dL (ref 30.0–36.0)
RBC: 3.76 MIL/uL — ABNORMAL LOW (ref 4.22–5.81)

## 2011-08-24 LAB — GLUCOSE, CAPILLARY

## 2011-08-24 LAB — PROTIME-INR: Prothrombin Time: 32.6 seconds — ABNORMAL HIGH (ref 11.6–15.2)

## 2011-08-24 MED ORDER — HYDROMORPHONE 0.3 MG/ML IV SOLN
INTRAVENOUS | Status: AC
  Filled 2011-08-24: qty 25

## 2011-08-24 MED ORDER — FOLIC ACID-VIT B6-VIT B12 2.5-25-1 MG PO TABS
1.0000 | ORAL_TABLET | Freq: Every day | ORAL | Status: DC
  Administered 2011-08-25 – 2011-08-26 (×2): 1 via ORAL
  Filled 2011-08-24 (×4): qty 1

## 2011-08-24 MED ORDER — ALPRAZOLAM 0.5 MG PO TABS
1.0000 mg | ORAL_TABLET | Freq: Three times a day (TID) | ORAL | Status: DC | PRN
  Administered 2011-08-26 – 2011-08-28 (×3): 1 mg via ORAL
  Filled 2011-08-24 (×3): qty 2

## 2011-08-24 MED ORDER — HYDROMORPHONE HCL PF 1 MG/ML IJ SOLN
1.0000 mg | INTRAMUSCULAR | Status: DC | PRN
  Administered 2011-08-24 (×4): 1 mg via INTRAVENOUS
  Filled 2011-08-24 (×4): qty 1

## 2011-08-24 MED ORDER — SEVELAMER CARBONATE 800 MG PO TABS
1600.0000 mg | ORAL_TABLET | Freq: Three times a day (TID) | ORAL | Status: DC
  Administered 2011-08-25 – 2011-09-01 (×17): 1600 mg via ORAL
  Filled 2011-08-24 (×26): qty 2

## 2011-08-24 MED ORDER — PANTOPRAZOLE SODIUM 40 MG PO TBEC
40.0000 mg | DELAYED_RELEASE_TABLET | Freq: Every day | ORAL | Status: DC
  Administered 2011-08-25 – 2011-09-01 (×8): 40 mg via ORAL
  Filled 2011-08-24 (×8): qty 1

## 2011-08-24 MED ORDER — HYDROMORPHONE HCL PF 1 MG/ML IJ SOLN
1.0000 mg | Freq: Once | INTRAMUSCULAR | Status: AC
  Administered 2011-08-24: 1 mg via INTRAVENOUS
  Filled 2011-08-24: qty 1

## 2011-08-24 MED ORDER — SODIUM CHLORIDE 0.9 % IJ SOLN: 9.0000 mL | INTRAMUSCULAR | Status: DC | PRN

## 2011-08-24 MED ORDER — HYDROCODONE-ACETAMINOPHEN 5-325 MG PO TABS: 1.0000 | ORAL_TABLET | ORAL | Status: DC | PRN

## 2011-08-24 MED ORDER — ESCITALOPRAM OXALATE 20 MG PO TABS
20.0000 mg | ORAL_TABLET | Freq: Every day | ORAL | Status: DC
  Administered 2011-08-25 – 2011-09-01 (×8): 20 mg via ORAL
  Filled 2011-08-24 (×8): qty 1

## 2011-08-24 MED ORDER — HYDROMORPHONE 0.3 MG/ML IV SOLN
INTRAVENOUS | Status: DC
  Administered 2011-08-24: 0.9 mg via INTRAVENOUS
  Administered 2011-08-24: 22:00:00 via INTRAVENOUS
  Administered 2011-08-25: 1.2 mg via INTRAVENOUS
  Administered 2011-08-25 (×2): via INTRAVENOUS
  Administered 2011-08-25: 3 mg via INTRAVENOUS
  Administered 2011-08-26: 22:00:00 via INTRAVENOUS
  Administered 2011-08-26: 4.15 mg via INTRAVENOUS
  Administered 2011-08-26: 4.7 mg via INTRAVENOUS
  Administered 2011-08-26: 3.9 mg via INTRAVENOUS
  Administered 2011-08-26 (×2): via INTRAVENOUS
  Administered 2011-08-26: 4.5 mg via INTRAVENOUS
  Administered 2011-08-27: 3 mg via INTRAVENOUS

## 2011-08-24 MED ORDER — PHYTONADIONE 5 MG PO TABS
5.0000 mg | ORAL_TABLET | Freq: Once | ORAL | Status: AC
  Administered 2011-08-24: 5 mg via ORAL
  Filled 2011-08-24: qty 1

## 2011-08-24 MED ORDER — NALOXONE HCL 0.4 MG/ML IJ SOLN: 0.4000 mg | INTRAMUSCULAR | Status: DC | PRN

## 2011-08-24 MED ORDER — MORPHINE SULFATE 2 MG/ML IJ SOLN: 2.0000 mg | INTRAMUSCULAR | Status: DC | PRN

## 2011-08-24 MED ORDER — SENNA 8.6 MG PO TABS
1.0000 | ORAL_TABLET | Freq: Two times a day (BID) | ORAL | Status: DC
  Administered 2011-08-25 – 2011-08-28 (×7): 8.6 mg via ORAL
  Filled 2011-08-24 (×9): qty 1

## 2011-08-24 NOTE — ED Provider Notes (Signed)
History     CSN: [id number]  Arrival date & time 08/24/11  1008   First MD Initiated Contact with Patient 08/24/11 1008      Chief Complaint  Patient presents with  . Fall     Patient is a 46 y.o. male presenting with fall. The history is provided by the patient and the EMS personnel.  Fall The accident occurred 1 to 2 hours ago. The fall occurred while walking. The point of impact was the left hip. The pain is present in the left hip. The pain is moderate. Pertinent negatives include no numbness, no abdominal pain, no vomiting, no headaches and no loss of consciousness. The symptoms are aggravated by activity. Treatment on scene includes a backboard. He has tried immobilization for the symptoms. The treatment provided no relief.  Pt reports he fell today while walking He reports he was using his walker, and he lost his balance and fell on his left side No LOC No head injury No neck pain No new weakness No abd pain  no vomiting No CP/SOB/Dizziness He reports most of his pain is in his left hip No low back pain He was immoblized by EMS for transport only  He is on Dialysis, last treatment yesterday (tues/thursday/saturday) He is on coumadin for DVT   Past Medical History  Diagnosis Date  . Diabetes mellitus   . Hypertension   . GERD (gastroesophageal reflux disease)   . S/P laparoscopic cholecystectomy   . DVT (deep venous thrombosis)   . Renal disorder     Past Surgical History  Procedure Date  . Gastric sleeve     No family history on file.  History  Substance Use Topics  . Smoking status: Current Everyday Smoker  . Smokeless tobacco: Not on file  . Alcohol Use: No      Review of Systems  Gastrointestinal: Negative for vomiting and abdominal pain.  Neurological: Negative for loss of consciousness, numbness and headaches.  All other systems reviewed and are negative.    Allergies  Review of patient's allergies indicates no known allergies.  Home  Medications   Current Outpatient Rx  Name Route Sig Dispense Refill  . ALPRAZOLAM 1 MG PO TABS Oral Take 1 mg by mouth every 6 (six) hours as needed.    Marland Kitchen CEFDINIR 300 MG PO CAPS Oral Take 600 mg by mouth daily.    Marland Kitchen CETIRIZINE HCL 10 MG PO TABS Oral Take 10 mg by mouth daily. At bedtime    . ESCITALOPRAM OXALATE 20 MG PO TABS Oral Take 20 mg by mouth daily.    Marland Kitchen FLURAZEPAM HCL 30 MG PO CAPS Oral Take 30 mg by mouth at bedtime as needed.    Marland Kitchen FOLIC ACID-VIT B6-VIT B12 2.5-25-1 MG PO TABS Oral Take 1 tablet by mouth daily.    Marland Kitchen OMEPRAZOLE 20 MG PO CPDR Oral Take 20 mg by mouth 2 (two) times daily.    . SENNA 8.6 MG PO TABS Oral Take 1 tablet by mouth 2 (two) times daily.    Marland Kitchen SEVELAMER CARBONATE 800 MG PO TABS Oral Take 800 mg by mouth 3 (three) times daily with meals.    . WARFARIN SODIUM 10 MG PO TABS Oral Take 10 mg by mouth daily.    . WARFARIN SODIUM 3 MG PO TABS Oral Take 3 mg by mouth daily.      BP 117/56  Pulse 86  Temp(Src) 98.2 F (36.8 C) (Oral)  Resp 20  Ht 6' (  1.829 m)  Wt 363 lb (164.656 kg)  BMI 49.23 kg/m2  SpO2 95%  Physical Exam CONSTITUTIONAL: Well developed/well nourished HEAD AND FACE: Normocephalic/atraumatic EYES: EOMI/PERRL ENMT: Mucous membranes moist NECK: supple no meningeal signs SPINE:entire spine nontender, No bruising/crepitance/stepoffs noted to spine, NEXUS criteria met CV: S1/S2 noted, no murmurs/rubs/gallops noted LUNGS: Lungs are clear to auscultation bilaterally, no apparent distress ABDOMEN: soft, nontender, no rebound or guarding.  He is obese.  No bruising noted GU:no cva tenderness NEURO: Pt is awake/alert, moves all extremitiesx4, GCS 15 EXTREMITIES: pulses normal, full ROM.  Tenderness with palpation/ROM of left hip.  No bruising noted.  No deformity noted All other extremities/joints palpated/ranged and nontender His dialysis access is noted to left UE, thrill noted SKIN: warm, color normal PSYCH: no abnormalities of mood  noted  ED Course  Procedures   Labs Reviewed  CBC  DIFFERENTIAL  BASIC METABOLIC PANEL  PROTIME-INR  10:50 AM Pt seen on arrival D/w EMS Mechanical fall while using walker No cp/sob/weakness reported Will follow closely 12:25 PM Imaging delayed due to issue with radiology Pt now in imaging Pt stable, no neurovascular deficits 12:42 PM Imaging noted He is neurovascular intact Will call into ortho He has no new complaints 12:58 PM D/w dr supple He is on call for [facility] He requests I call medicine/ortho at [facility] since he is dialysis patient 1:07 PM D/w dr [name], ortho, aware of patient and send to [facility] admit to medicine He is aware of elevated INR 1:25 PM D/w dr Sunnie Nielsen, [facility], will admit, request one dose of Vit K Pt has no other signs of traumatic injury he is awake/alert appropriate for transfer   MDM  Nursing notes reviewed and considered in documentation xrays reviewed and considered All labs/vitals reviewed and considered         Joya Gaskins, MD 08/24/11 1326

## 2011-08-24 NOTE — ED Notes (Signed)
[facility] advised of wait for [facility] at [facility] and [facility] 2nd unit with stretcher for pt

## 2011-08-24 NOTE — ED Notes (Signed)
[facility] bed ready and 2nd [facility] unit here for transport

## 2011-08-24 NOTE — Consult Note (Signed)
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  He is beset with some medical issues including morbid obesity, ESRD on dialysis, diabetes, and DVT on chronic coumadin.  Fell at home where he normally walks with a walker and could not get up.  Taken to [facility] in [place] and transferred here as needs access to dialysis.  ORS consulted about hip.  No other complaints of pain. 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   Past Medical History  Diagnosis Date  . Diabetes mellitus   . Hypertension   . GERD (gastroesophageal reflux disease)   . S/P laparoscopic cholecystectomy   . DVT (deep venous thrombosis)   . Renal disorder     Past Surgical History  Procedure Date  . Gastric sleeve     No family history on file.  Social History:  reports that he has been smoking.  He does not have any smokeless tobacco history on file. He reports that he does not drink alcohol or use illicit drugs.  Allergies:  Allergies  Allergen Reactions  . Zosyn Hives    Medications:  Scheduled:   . escitalopram  20 mg Oral Daily  . Folic Acid-Vit B6-Vit B12  1 tablet Oral Daily  . HYDROmorphone  1 mg Intravenous Once  . HYDROmorphone  1 mg Intravenous Once  . pantoprazole  40 mg Oral Q1200  . phytonadione  5 mg Oral Once  . senna  1 tablet Oral BID  . sevelamer  1,600 mg Oral TID WC    Results for orders placed during the hospital encounter of 08/24/11 (from the past 48 hour(s))  CBC     Status: Abnormal   Collection Time   08/24/11 10:30 AM      Component Value Range Comment   WBC 11.2 (*) 4.0 - 10.5 (K/uL)    RBC 3.76 (*) 4.22 - 5.81 (MIL/uL)    Hemoglobin 12.0 (*) 13.0 - 17.0 (g/dL)    HCT 16.1 (*) 09.6 - 52.0 (%)    MCV 98.1  78.0 - 100.0 (fL)    MCH 31.9  26.0 - 34.0 (pg)    MCHC 32.5  30.0 - 36.0 (g/dL)    RDW 04.5 (*) 40.9 - 15.5 (%)    Platelets 228  150 - 400  (K/uL)   DIFFERENTIAL     Status: Abnormal   Collection Time   08/24/11 10:30 AM      Component Value Range Comment   Neutrophils Relative 74  43 - 77 (%)    Lymphocytes Relative 13  12 - 46 (%)    Monocytes Relative 6  3 - 12 (%)    Eosinophils Relative 0  0 - 5 (%)    Basophils Relative 0  0 - 1 (%)    Band Neutrophils 4  0 - 10 (%)    Metamyelocytes Relative 3      Myelocytes 0      Promyelocytes Absolute 0      Blasts 0      nRBC 0  0 (/100 WBC)    Neutro Abs 9.0 (*) 1.7 - 7.7 (K/uL)    Lymphs Abs 1.5  0.7 - 4.0 (K/uL)    Monocytes Absolute 0.7  0.1 - 1.0 (K/uL)    Eosinophils Absolute 0.0  0.0 - 0.7 (K/uL)  Basophils Absolute 0.0  0.0 - 0.1 (K/uL)    WBC Morphology MILD LEFT SHIFT (1-5% METAS, OCC MYELO, OCC BANDS)   TOXIC GRANULATION  BASIC METABOLIC PANEL     Status: Abnormal   Collection Time   08/24/11 10:30 AM      Component Value Range Comment   Sodium 139  135 - 145 (mEq/L)    Potassium 3.8  3.5 - 5.1 (mEq/L)    Chloride 97  96 - 112 (mEq/L)    CO2 30  19 - 32 (mEq/L)    Glucose, Bld 120 (*) 70 - 99 (mg/dL)    BUN 39 (*) 6 - 23 (mg/dL)    Creatinine, Ser 1.61 (*) 0.50 - 1.35 (mg/dL)    Calcium 9.5  8.4 - 10.5 (mg/dL)    GFR calc non Af Amer 14 (*) >90 (mL/min)    GFR calc Af Amer 17 (*) >90 (mL/min)   PROTIME-INR     Status: Abnormal   Collection Time   08/24/11 10:30 AM      Component Value Range Comment   Prothrombin Time 32.6 (*) 11.6 - 15.2 (seconds)    INR 3.12 (*) 0.00 - 1.49    GLUCOSE, CAPILLARY     Status: Abnormal   Collection Time   08/24/11  2:45 PM      Component Value Range Comment   Glucose-Capillary 100 (*) 70 - 99 (mg/dL)     Dg Hip Complete Left  08/24/2011  *RADIOLOGY REPORT*  Clinical Data: Left hip pain post fall  LEFT HIP - COMPLETE 2+ VIEW  Comparison: None  Findings: Osseous demineralization. Nondisplaced intertrochanteric fracture left femur. No femoral head dislocation. Bony pelvis appears grossly intact. Scattered pelvic  phleboliths. No other regional bony findings identified.  IMPRESSION: Nondisplaced intertrochanteric fracture left femur.  Original Report Authenticated By: Lollie Marrow, M.D.   Dg Chest Port 1 View  08/24/2011  *RADIOLOGY REPORT*  Clinical Data: Fall, preop.  PORTABLE CHEST - 1 VIEW  Comparison: 11/05/2010.  Findings: Trachea is midline.  Heart size stable.  There is interstitial prominence and indistinctness bilaterally. Linear densities are seen in the left lower lobe.  Left costophrenic angle was not included on the film.  IMPRESSION: Pulmonary edema.  Left lower lobe subsegmental atelectasis.  Original Report Authenticated By: Reyes Ivan, M.D.    @ROS @ Blood pressure 114/66, pulse 86, temperature 98.2 F (36.8 C), temperature source Oral, resp. rate 16, height 6' (1.829 m), weight 164.656 kg (363 lb), SpO2 98.00%.  PHYSICAL EXAM:   ABD soft Neurovascular intact Sensation intact distally Compartment soft Very obese Right leg is externally rotated Both arms and other leg move fully Chronic foot drop on left with no DF of ankle or toes  ASSESSMENT:   Left hip intertrochanteric fracture  PLAN:   Best managed surgically with DHS.  Don't think he would adduct enough to allow placement of troch nail.  Reviewed risks of infection, DVT, PE, and death but no very good alternative.  Will need to remain mostly NWB to TDWB for 4-6 weeks after surgery and would probably be best in wheelchair.  Will be very difficult case due to size of patient but again no good alternative.  Need medical clearance and appreciate vit K already given.  Will have dialysis tomorrow and will plan on surgery for Friday morning assuming INR comes down below 2 and medical team feels he is as tuned as he can be.  Needs AFO on foot after we fix  him and he starts walking as his foot drop is likely what caused him to trip and fall. May eat tonight but will be NPO tomorrow night after midnight.   [name]  G 08/24/2011, 6:33 PM

## 2011-08-24 NOTE — ED Notes (Signed)
Pt's wife- was notified notified of bed assignment-pt requested pain med-EDP [place] advised no further pain med at present due to decreased sat with last dilaudid dose-[facility] notified

## 2011-08-24 NOTE — ED Notes (Signed)
Pt supine on hospital bed-family x 4 at BS-pt c/o left leg pain

## 2011-08-24 NOTE — ED Notes (Signed)
Pt fell from standing at home. Pt fully immobilized by [facility] and brought to room 5. Pt c/o left upper leg pain into groin. Pt placed in hospital bed due to bariatric need.

## 2011-08-24 NOTE — H&P (Signed)
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 patient was transferred to [facility] for further management after speaking with ortho consult. Patient informs have some left hip pain at present but stable on pain meds. denies passing out or being dizzy and simply tripped at home and sustained the fall. He denies any chest pain, palpitations, SOB, headache, blurry vision,abd pain, fever or diarrhea. At baseline is able to ambulate about 50 feet before getting tired.   Allergies: Allergies  Allergen Reactions  . Zosyn Hives    Prior to Admission medications   Medication Sig Start Date End Date Taking? Authorizing Provider  ALPRAZolam Prudy Feeler) 1 MG tablet Take 1 mg by mouth every 6 (six) hours as needed. anxiety   Yes Historical Provider, MD  cefdinir (OMNICEF) 300 MG capsule Take 300 mg by mouth 2 (two) times daily.    Yes Historical Provider, MD  cetirizine (ZYRTEC) 10 MG tablet Take 10 mg by mouth at bedtime. At bedtime   Yes Historical Provider, MD  epoetin alfa (EPOGEN,PROCRIT) 16109 UNIT/ML injection Inject 6,500 Units into the skin 3 (three) times a week. Receives at [facility]. Last visit 08/23/11   Yes Historical Provider, MD  escitalopram (LEXAPRO) 20 MG tablet Take 20 mg by mouth daily.   Yes Historical Provider, MD  flurazepam (DALMANE) 30 MG capsule Take 30 mg by mouth at bedtime.    Yes Historical Provider, MD  Folic Acid-Vit B6-Vit B12 (FOLBEE) 2.5-25-1 MG TABS Take 1 tablet by mouth daily.   Yes Historical Provider, MD  Heparin & NaCl  Lock Flush (HEPARIN SODIUM FLUSH IV) Inject 8,600 Units into the vein 3 (three) times a week. Flush line with 8600 units then runs 3000 units per hour during dialysis  Last treatment 08-23-11   Yes Historical Provider, MD  omeprazole (PRILOSEC) 20 MG capsule Take 20 mg by mouth 2 (two) times daily.   Yes Historical Provider, MD  oxycodone (OXYCONTIN) 30 MG TB12 Take 30 mg by mouth.   Yes Historical Provider, MD  senna (SENOKOT) 8.6 MG TABS Take 1 tablet by mouth 2 (two) times daily.   Yes Historical Provider, MD  sevelamer (RENVELA) 800 MG tablet Take 1,600 mg by mouth 3 (three) times daily with meals.    Yes Historical Provider, MD  warfarin (COUMADIN) 10 MG tablet Take 10 mg by mouth daily.   Yes Historical Provider, MD  warfarin (COUMADIN) 3 MG tablet Take 3 mg by mouth daily.   Yes Historical Provider, MD    Past Medical History  Diagnosis Date  . Diabetes mellitus   . Hypertension   . GERD (gastroesophageal reflux disease)   . S/P laparoscopic cholecystectomy   . DVT (deep venous thrombosis)   . Renal disorder     Past Surgical History  Procedure Date  . Gastric sleeve     Social History:  reports that he has been smoking.  He does not have any smokeless tobacco history on file. He reports that he  does not drink alcohol or use illicit drugs.  No family history on file.  Review of Systems:  Constitutional: Denies fever, chills, diaphoresis, appetite change and fatigue.  HEENT: Denies photophobia, eye pain, redness, hearing loss, ear pain, congestion, sore throat, rhinorrhea, sneezing, mouth sores, trouble swallowing, neck pain, neck stiffness and tinnitus.   Respiratory: Denies SOB, DOE, cough, chest tightness,  and wheezing.   Cardiovascular: Denies chest pain, palpitations and leg swelling.  Gastrointestinal: Denies nausea, vomiting, abdominal pain, diarrhea, constipation, blood in stool and abdominal distention.  Genitourinary: Denies dysuria, urgency, frequency, hematuria,  flank pain and difficulty urinating.  Musculoskeletal: pain over left hip on movement, , back pain, joint swelling, arthralgias and gait problem.  Skin: Denies pallor, rash and wound.  Neurological: Denies dizziness, seizures, syncope, weakness, light-headedness, numbness and headaches.  Hematological: Denies adenopathy. Easy bruising, personal or family bleeding history  Psychiatric/Behavioral: Denies suicidal ideation, mood changes, confusion, nervousness, sleep disturbance and agitation   Physical Exam:  Filed Vitals:   08/24/11 1342 08/24/11 1435 08/24/11 1452 08/24/11 1547  BP: 122/56 113/62 124/66 114/66  Pulse: 95 93 90 86  Temp:      TempSrc:      Resp:  17 16   Height:      Weight:      SpO2: 97% 93% 98% 98%    Constitutional: Vital signs reviewed.  Patient is a morbidly obese male in no acute distress and cooperative with exam. Alert and oriented x3.  Head: Normocephalic and atraumatic Ear: TM normal bilaterally Mouth: no erythema or exudates, MMM Eyes: PERRL, EOMI, conjunctivae normal, No scleral icterus.  Neck: Supple, Trachea midline normal ROM, No JVD, mass, thyromegaly, or carotid bruit present. tracheostomy scar Cardiovascular: RRR, S1 normal, S2 normal, no MRG, pulses symmetric and intact bilaterally Pulmonary/Chest: CTAB, no wheezes, rales, or rhonchi Abdominal: laparotomy scar with a superficial skin wound which is clean and dry,  Soft. Non-tender, non-distended, bowel sounds are normal, no masses, organomegaly, or guarding present.  GU: no CVA tenderness Musculoskeletal: left leg externally rotated, tender on exam and ROM , no erythema,  Ext: no edema and no cyanosis, pulses palpable bilaterally (DP and PT) Hematology: no cervical, inginal, or axillary adenopathy.  Neurological: A&O x3, Strenght is normal and symmetric bilaterally, cranial nerve II-XII are grossly intact, no focal motor deficit, sensory intact to light touch bilaterally.  Skin: Warm, dry and  intact. No rash, cyanosis, or clubbing.  Psychiatric: Normal mood and affect. speech and behavior is normal. Judgment and thought content normal. Cognition and memory are normal.   Labs on Admission:  Results for orders placed during the hospital encounter of 08/24/11 (from the past 48 hour(s))  CBC     Status: Abnormal   Collection Time   08/24/11 10:30 AM      Component Value Range Comment   WBC 11.2 (*) 4.0 - 10.5 (K/uL)    RBC 3.76 (*) 4.22 - 5.81 (MIL/uL)    Hemoglobin 12.0 (*) 13.0 - 17.0 (g/dL)    HCT 95.6 (*) 21.3 - 52.0 (%)    MCV 98.1  78.0 - 100.0 (fL)    MCH 31.9  26.0 - 34.0 (pg)    MCHC 32.5  30.0 - 36.0 (g/dL)    RDW 08.6 (*) 57.8 - 15.5 (%)    Platelets 228  150 - 400 (K/uL)   DIFFERENTIAL     Status: Abnormal   Collection Time   08/24/11 10:30 AM      Component Value Range  Comment   Neutrophils Relative 74  43 - 77 (%)    Lymphocytes Relative 13  12 - 46 (%)    Monocytes Relative 6  3 - 12 (%)    Eosinophils Relative 0  0 - 5 (%)    Basophils Relative 0  0 - 1 (%)    Band Neutrophils 4  0 - 10 (%)    Metamyelocytes Relative 3      Myelocytes 0      Promyelocytes Absolute 0      Blasts 0      nRBC 0  0 (/100 WBC)    Neutro Abs 9.0 (*) 1.7 - 7.7 (K/uL)    Lymphs Abs 1.5  0.7 - 4.0 (K/uL)    Monocytes Absolute 0.7  0.1 - 1.0 (K/uL)    Eosinophils Absolute 0.0  0.0 - 0.7 (K/uL)    Basophils Absolute 0.0  0.0 - 0.1 (K/uL)    WBC Morphology MILD LEFT SHIFT (1-5% METAS, OCC MYELO, OCC BANDS)   TOXIC GRANULATION  BASIC METABOLIC PANEL     Status: Abnormal   Collection Time   08/24/11 10:30 AM      Component Value Range Comment   Sodium 139  135 - 145 (mEq/L)    Potassium 3.8  3.5 - 5.1 (mEq/L)    Chloride 97  96 - 112 (mEq/L)    CO2 30  19 - 32 (mEq/L)    Glucose, Bld 120 (*) 70 - 99 (mg/dL)    BUN 39 (*) 6 - 23 (mg/dL)    Creatinine, Ser 1.61 (*) 0.50 - 1.35 (mg/dL)    Calcium 9.5  8.4 - 10.5 (mg/dL)    GFR calc non Af Amer 14 (*) >90 (mL/min)    GFR calc Af  Amer 17 (*) >90 (mL/min)   PROTIME-INR     Status: Abnormal   Collection Time   08/24/11 10:30 AM      Component Value Range Comment   Prothrombin Time 32.6 (*) 11.6 - 15.2 (seconds)    INR 3.12 (*) 0.00 - 1.49    GLUCOSE, CAPILLARY     Status: Abnormal   Collection Time   08/24/11  2:45 PM      Component Value Range Comment   Glucose-Capillary 100 (*) 70 - 99 (mg/dL)     Radiological Exams on Admission: RADIOLOGY REPORT*  Clinical Data: Left hip pain post fall  LEFT HIP - COMPLETE 2+ VIEW  Comparison: None  Findings:  Osseous demineralization.  Nondisplaced intertrochanteric fracture left femur.  No femoral head dislocation.  Bony pelvis appears grossly intact.  Scattered pelvic phleboliths.  No other regional bony findings identified.  IMPRESSION:  Nondisplaced intertrochanteric fracture left femur.   Assessment/Plan   * Left Intertrochanteric fracture of femur Admit to medicine Pain control with prn morphine and vicodin  monitor for sedation and or respiratory compromise Ortho consult ( Dr [name]) called from [facility]. 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 DM informs his DM to have  resolved since 2 years  Fsg have been normal  will check A1C   Full code  Time Spent on Admission: 50 minutes  Shametra Cumberland 08/24/2011, 5:40 PM

## 2011-08-24 NOTE — ED Notes (Signed)
Pt has fistula to left arm and [facility]. Pt is followed at [facility].

## 2011-08-24 NOTE — ED Notes (Signed)
Pt and family notified xray machine under maintenance and would be able to do xray approx 30+ min-xray tech reports unable to perform portable films due to pt size

## 2011-08-24 NOTE — ED Notes (Signed)
Pt was placed on O2 after 4mg  dialaudid due to sats dropped to 90 per V Henson,RN

## 2011-08-25 ENCOUNTER — Inpatient Hospital Stay (HOSPITAL_COMMUNITY): Payer: BC Managed Care – PPO

## 2011-08-25 LAB — CBC
HCT: 34.1 % — ABNORMAL LOW (ref 39.0–52.0)
Hemoglobin: 10.9 g/dL — ABNORMAL LOW (ref 13.0–17.0)
MCH: 31.9 pg (ref 26.0–34.0)
MCV: 99.7 fL (ref 78.0–100.0)
RBC: 3.42 MIL/uL — ABNORMAL LOW (ref 4.22–5.81)

## 2011-08-25 LAB — BASIC METABOLIC PANEL
CO2: 29 mEq/L (ref 19–32)
Glucose, Bld: 79 mg/dL (ref 70–99)
Potassium: 4.1 mEq/L (ref 3.5–5.1)
Sodium: 139 mEq/L (ref 135–145)

## 2011-08-25 LAB — GLUCOSE, CAPILLARY: Glucose-Capillary: 149 mg/dL — ABNORMAL HIGH (ref 70–99)

## 2011-08-25 LAB — PROTIME-INR: Prothrombin Time: 28.7 seconds — ABNORMAL HIGH (ref 11.6–15.2)

## 2011-08-25 LAB — HEMOGLOBIN A1C: Mean Plasma Glucose: 105 mg/dL (ref <117)

## 2011-08-25 MED ORDER — PHYTONADIONE 5 MG PO TABS
5.0000 mg | ORAL_TABLET | Freq: Once | ORAL | Status: AC
  Administered 2011-08-25: 5 mg via ORAL
  Filled 2011-08-25 (×2): qty 1

## 2011-08-25 MED ORDER — HYDROMORPHONE 0.3 MG/ML IV SOLN
INTRAVENOUS | Status: AC
  Filled 2011-08-25: qty 25

## 2011-08-25 MED ORDER — DARBEPOETIN ALFA-POLYSORBATE 60 MCG/0.3ML IJ SOLN: 60.0000 ug | INTRAMUSCULAR | Status: DC

## 2011-08-25 MED ORDER — CALCITRIOL 1 MCG/ML IV SOLN: 1.0000 ug | INTRAVENOUS | Status: DC

## 2011-08-25 MED ORDER — DARBEPOETIN ALFA-POLYSORBATE 60 MCG/0.3ML IJ SOLN
60.0000 ug | INTRAMUSCULAR | Status: DC
  Administered 2011-08-25 – 2011-09-01 (×2): 60 ug via INTRAVENOUS
  Filled 2011-08-25 (×2): qty 0.3

## 2011-08-25 MED ORDER — DARBEPOETIN ALFA-POLYSORBATE 60 MCG/0.3ML IJ SOLN
INTRAMUSCULAR | Status: AC
  Administered 2011-08-25: 60 ug via INTRAVENOUS
  Filled 2011-08-25: qty 0.3

## 2011-08-25 NOTE — Progress Notes (Signed)
Planning ORIF hip tomorrow.  INR coming down and should be fine tomorrow.  Obviously a very difficult case but no good alternative.  Hoping for surgery in morning but OR schedule in flux and will probably be in afternoon. In dialysis now.

## 2011-08-25 NOTE — Progress Notes (Signed)
Pt requesting pain medication every 20 to 30 mins. Called Maren Reamer NP requesting PCA pump. New order given.

## 2011-08-25 NOTE — Progress Notes (Signed)
   CARE MANAGEMENT NOTE 08/25/2011  Patient:  [name], [name]   Account Number:  [id number]  Date Initiated:  08/25/2011  Documentation initiated by:  Donn Pierini  Subjective/Objective Assessment:   Pt admitted with hip fx, will need OR repair     Action/Plan:   PTA pt lived at home with spouse, hx ESRD on HD t/th/sat   Anticipated DC Date:  08/29/2011   Anticipated DC Plan:  SKILLED NURSING FACILITY  In-house referral  Clinical Social Worker      DC Planning Services  CM consult      Choice offered to / List presented to:             Status of service:  In process, will continue to follow Medicare Important Message given?   (If response is "NO", the following Medicare IM given date fields will be blank) Date Medicare IM given:   Date Additional Medicare IM given:    Discharge Disposition:    Per UR Regulation:    If discussed at Long Length of Stay Meetings, dates discussed:    Comments:  08/25/11- 1400 - Donn Pierini RN, BSN [phone] Plan for OR on Friday 08/26/11 for hip repair- PT/OT after surgery. CM will follow for d/c needs/planning.

## 2011-08-25 NOTE — Progress Notes (Signed)
Subjective: Patient seen and examined this am. Feels his pain stable on pain meds  Objective:  Vital signs in last 24 hours:  Filed Vitals:   08/25/11 0045 08/25/11 0417 08/25/11 0441 08/25/11 0943  BP: 112/68  131/57 120/69  Pulse: 76  72 76  Temp: 98.4 F (36.9 C)  97.4 F (36.3 C) 98.6 F (37 C)  TempSrc: Oral  Oral Oral  Resp: 20 19 18 20   Height:      Weight:      SpO2: 95% 94% 97% 98%    Intake/Output from previous day:   Intake/Output Summary (Last 24 hours) at 08/25/11 1249 Last data filed at 08/25/11 1100  Gross per 24 hour  Intake    247 ml  Output      0 ml  Net    247 ml    Physical Exam:   Constitutional: Denies fever, chills, diaphoresis, appetite change and fatigue.  HEENT: Denies photophobia, eye pain, redness, hearing loss, ear pain, congestion, sore throat, rhinorrhea, sneezing, mouth sores, trouble swallowing, neck pain, neck stiffness and tinnitus.  Respiratory: Denies SOB, DOE, cough, chest tightness, and wheezing.  Cardiovascular: Denies chest pain, palpitations and leg swelling.  Gastrointestinal: Denies nausea, vomiting, abdominal pain, diarrhea, constipation, blood in stool and abdominal distention.  Genitourinary: Denies dysuria, urgency, frequency, hematuria, flank pain and difficulty urinating.  Musculoskeletal: pain over left hip on movement, , back pain, joint swelling, arthralgias and gait problem.  Skin: Denies pallor, rash and wound.  Neurological: Denies dizziness, seizures, syncope, weakness, light-headedness, numbness and headaches   Lab Results:  Basic Metabolic Panel:    Component Value Date/Time   NA 139 08/25/2011 0540   K 4.1 08/25/2011 0540   CL 99 08/25/2011 0540   CO2 29 08/25/2011 0540   BUN 51* 08/25/2011 0540   CREATININE 5.42* 08/25/2011 0540   GLUCOSE 79 08/25/2011 0540   CALCIUM 9.1 08/25/2011 0540   CBC:    Component Value Date/Time   WBC 8.6 08/25/2011 0540   HGB 10.9* 08/25/2011 0540   HCT 34.1* 08/25/2011  0540   PLT 174 08/25/2011 0540   MCV 99.7 08/25/2011 0540   NEUTROABS 9.0* 08/24/2011 1030   LYMPHSABS 1.5 08/24/2011 1030   MONOABS 0.7 08/24/2011 1030   EOSABS 0.0 08/24/2011 1030   BASOSABS 0.0 08/24/2011 1030    Recent Results (from the past 240 hour(s))  MRSA PCR SCREENING     Status: Normal   Collection Time   08/24/11  7:29 PM      Component Value Range Status Comment   MRSA by PCR NEGATIVE  NEGATIVE  Final     Studies/Results: Dg Hip Complete Left  08/24/2011  *RADIOLOGY REPORT*  Clinical Data: Left hip pain post fall  LEFT HIP - COMPLETE 2+ VIEW  Comparison: None  Findings: Osseous demineralization. Nondisplaced intertrochanteric fracture left femur. No femoral head dislocation. Bony pelvis appears grossly intact. Scattered pelvic phleboliths. No other regional bony findings identified.  IMPRESSION: Nondisplaced intertrochanteric fracture left femur.  Original Report Authenticated By: Lollie Marrow, M.D.   Dg Chest Port 1 View  08/24/2011  *RADIOLOGY REPORT*  Clinical Data: Fall, preop.  PORTABLE CHEST - 1 VIEW  Comparison: 11/05/2010.  Findings: Trachea is midline.  Heart size stable.  There is interstitial prominence and indistinctness bilaterally. Linear densities are seen in the left lower lobe.  Left costophrenic angle was not included on the film.  IMPRESSION: Pulmonary edema.  Left lower lobe subsegmental atelectasis.  Original Report Authenticated  By: Reyes Ivan, M.D.    Medications: Scheduled Meds:   . calcitRIOL  1 mcg Intravenous Q T,Th,Sa-HD  . darbepoetin (ARANESP) injection - DIALYSIS  60 mcg Intravenous Q Thu-HD  . escitalopram  20 mg Oral Daily  . Folic Acid-Vit B6-Vit B12  1 tablet Oral Daily  . HYDROmorphone PCA 0.3 mg/mL   Intravenous Q4H  . pantoprazole  40 mg Oral Q1200  . phytonadione  5 mg Oral Once  . phytonadione  5 mg Oral Once  . senna  1 tablet Oral BID  . sevelamer  1,600 mg Oral TID WC   Continuous Infusions:  PRN Meds:.ALPRAZolam,  naloxone, sodium chloride, DISCONTD: HYDROcodone-acetaminophen, DISCONTD: HYDROmorphone, DISCONTD: morphine  Assessment/Plan:  * Left Intertrochanteric fracture of femur  Pain control with prn morphine and vicodin  monitor for sedation and or respiratory compromise  Seen by Dr [name] on admission. Plan on OR in am ( 3/29) if INR improved  NPO for possible surgery after midnight  ESRD (end stage renal disease) on dialysis  Gets HD tu, th , sat  Renal team following  Hypertension  BP currently stable   Morbid obesity  S/P gastric sleeve in 2011   GERD (gastroesophageal reflux disease)  Cont PPI   OSA (obstructive sleep apnea)  informs being on CPAP at home but non compliant   DVT (deep venous thrombosis) of left leg  On coumadin since march 2012, supratherapetic INR, will hold coumadin, 1 dose vitamin K given at [facility], coumadin on hold. INR 2.65 this am. Will given another 5 mg vit k and monitor  ? DM  informs his DM to have resolved since 2 years  Fsg have been normal  A1C of 5.3 only   Full code    LOS: 1 day   Karlina Suares 08/25/2011, 12:49 PM

## 2011-08-25 NOTE — Consult Note (Addendum)
Reason for Consult: manage ESRD dialysis and related needs Referring Physician: Dhungel  [name] is an 46 y.o. male.  HPI: PMhx significant for HTN, DM, obesity and chronic foot drop as well as ESRD- HD at [facility] on TTS via AVF.  [name] unfortunately tripped and fell and suffered a hip fracture.  He will need surgery, tentatively scheduled for Friday as need to wait until INR goes down.  Patient is currently a little groggy due to pain meds.  Today is his HD day so we will arrange.  Dialyzes at [facility] on TTS . Primary Nephrologist [facility]. EDW 361 lbs HD Bath 2/2, Dialyzer polyflux, Heparin yes. Access AVF. epo [id number]   Past Medical History  Diagnosis Date  . Hypertension   . GERD (gastroesophageal reflux disease)   . S/P laparoscopic cholecystectomy   . DVT (deep venous thrombosis)   . Renal disorder   . Complication of anesthesia     " I am hard  to wake up "  . Diabetes mellitus     Past Surgical History  Procedure Date  . Gastric sleeve   . Cholecystectomy, laparoscopic   . Kidney stones   . Dialysis port     History reviewed. No pertinent family history.  Social History:  reports that he has been smoking Cigarettes.  He has never used smokeless tobacco. He reports that he does not drink alcohol or use illicit drugs.  Allergies:  Allergies  Allergen Reactions  . Zosyn Hives    Medications: I have reviewed the patient's current medications.  Epogen 6500   Results for orders placed during the hospital encounter of 08/24/11 (from the past 48 hour(s))  CBC     Status: Abnormal   Collection Time   08/24/11 10:30 AM      Component Value Range Comment   WBC 11.2 (*) 4.0 - 10.5 (K/uL)    RBC 3.76 (*) 4.22 - 5.81 (MIL/uL)    Hemoglobin 12.0 (*) 13.0 - 17.0 (g/dL)    HCT 16.1 (*) 09.6 - 52.0 (%)    MCV 98.1  78.0 - 100.0 (fL)    MCH 31.9  26.0 - 34.0 (pg)    MCHC 32.5  30.0 - 36.0 (g/dL)    RDW 04.5 (*) 40.9 - 15.5 (%)    Platelets 228  150 - 400  (K/uL)   DIFFERENTIAL     Status: Abnormal   Collection Time   08/24/11 10:30 AM      Component Value Range Comment   Neutrophils Relative 74  43 - 77 (%)    Lymphocytes Relative 13  12 - 46 (%)    Monocytes Relative 6  3 - 12 (%)    Eosinophils Relative 0  0 - 5 (%)    Basophils Relative 0  0 - 1 (%)    Band Neutrophils 4  0 - 10 (%)    Metamyelocytes Relative 3      Myelocytes 0      Promyelocytes Absolute 0      Blasts 0      nRBC 0  0 (/100 WBC)    Neutro Abs 9.0 (*) 1.7 - 7.7 (K/uL)    Lymphs Abs 1.5  0.7 - 4.0 (K/uL)    Monocytes Absolute 0.7  0.1 - 1.0 (K/uL)    Eosinophils Absolute 0.0  0.0 - 0.7 (K/uL)    Basophils Absolute 0.0  0.0 - 0.1 (K/uL)    WBC Morphology MILD LEFT SHIFT (  1-5% METAS, OCC MYELO, OCC BANDS)   TOXIC GRANULATION  BASIC METABOLIC PANEL     Status: Abnormal   Collection Time   08/24/11 10:30 AM      Component Value Range Comment   Sodium 139  135 - 145 (mEq/L)    Potassium 3.8  3.5 - 5.1 (mEq/L)    Chloride 97  96 - 112 (mEq/L)    CO2 30  19 - 32 (mEq/L)    Glucose, Bld 120 (*) 70 - 99 (mg/dL)    BUN 39 (*) 6 - 23 (mg/dL)    Creatinine, Ser 7.84 (*) 0.50 - 1.35 (mg/dL)    Calcium 9.5  8.4 - 10.5 (mg/dL)    GFR calc non Af Amer 14 (*) >90 (mL/min)    GFR calc Af Amer 17 (*) >90 (mL/min)   PROTIME-INR     Status: Abnormal   Collection Time   08/24/11 10:30 AM      Component Value Range Comment   Prothrombin Time 32.6 (*) 11.6 - 15.2 (seconds)    INR 3.12 (*) 0.00 - 1.49    GLUCOSE, CAPILLARY     Status: Abnormal   Collection Time   08/24/11  2:45 PM      Component Value Range Comment   Glucose-Capillary 100 (*) 70 - 99 (mg/dL)   MRSA PCR SCREENING     Status: Normal   Collection Time   08/24/11  7:29 PM      Component Value Range Comment   MRSA by PCR NEGATIVE  NEGATIVE    GLUCOSE, CAPILLARY     Status: Abnormal   Collection Time   08/24/11 11:13 PM      Component Value Range Comment   Glucose-Capillary 166 (*) 70 - 99 (mg/dL)    Comment 1  Documented in Chart      Comment 2 Notify RN     BASIC METABOLIC PANEL     Status: Abnormal   Collection Time   08/25/11  5:40 AM      Component Value Range Comment   Sodium 139  135 - 145 (mEq/L)    Potassium 4.1  3.5 - 5.1 (mEq/L)    Chloride 99  96 - 112 (mEq/L)    CO2 29  19 - 32 (mEq/L)    Glucose, Bld 79  70 - 99 (mg/dL)    BUN 51 (*) 6 - 23 (mg/dL)    Creatinine, Ser 6.96 (*) 0.50 - 1.35 (mg/dL)    Calcium 9.1  8.4 - 10.5 (mg/dL)    GFR calc non Af Amer 12 (*) >90 (mL/min)    GFR calc Af Amer 14 (*) >90 (mL/min)   CBC     Status: Abnormal   Collection Time   08/25/11  5:40 AM      Component Value Range Comment   WBC 8.6  4.0 - 10.5 (K/uL)    RBC 3.42 (*) 4.22 - 5.81 (MIL/uL)    Hemoglobin 10.9 (*) 13.0 - 17.0 (g/dL)    HCT 29.5 (*) 28.4 - 52.0 (%)    MCV 99.7  78.0 - 100.0 (fL)    MCH 31.9  26.0 - 34.0 (pg)    MCHC 32.0  30.0 - 36.0 (g/dL)    RDW 13.2 (*) 44.0 - 15.5 (%)    Platelets 174  150 - 400 (K/uL)   PROTIME-INR     Status: Abnormal   Collection Time   08/25/11  5:40 AM      Component Value  Range Comment   Prothrombin Time 28.7 (*) 11.6 - 15.2 (seconds)    INR 2.65 (*) 0.00 - 1.49    HEMOGLOBIN A1C     Status: Normal   Collection Time   08/25/11  5:40 AM      Component Value Range Comment   Hemoglobin A1C 5.3  <5.7 (%)    Mean Plasma Glucose 105  <117 (mg/dL)   GLUCOSE, CAPILLARY     Status: Abnormal   Collection Time   08/25/11  7:40 AM      Component Value Range Comment   Glucose-Capillary 111 (*) 70 - 99 (mg/dL)   GLUCOSE, CAPILLARY     Status: Abnormal   Collection Time   08/25/11 11:29 AM      Component Value Range Comment   Glucose-Capillary 149 (*) 70 - 99 (mg/dL)     Dg Hip Complete Left  08/24/2011  *RADIOLOGY REPORT*  Clinical Data: Left hip pain post fall  LEFT HIP - COMPLETE 2+ VIEW  Comparison: None  Findings: Osseous demineralization. Nondisplaced intertrochanteric fracture left femur. No femoral head dislocation. Bony pelvis appears grossly  intact. Scattered pelvic phleboliths. No other regional bony findings identified.  IMPRESSION: Nondisplaced intertrochanteric fracture left femur.  Original Report Authenticated By: Lollie Marrow, M.D.   Dg Chest Port 1 View  08/24/2011  *RADIOLOGY REPORT*  Clinical Data: Fall, preop.  PORTABLE CHEST - 1 VIEW  Comparison: 11/05/2010.  Findings: Trachea is midline.  Heart size stable.  There is interstitial prominence and indistinctness bilaterally. Linear densities are seen in the left lower lobe.  Left costophrenic angle was not included on the film.  IMPRESSION: Pulmonary edema.  Left lower lobe subsegmental atelectasis.  Original Report Authenticated By: Reyes Ivan, M.D.    ROS: Difficult to obtain secondary to med effect. Blood pressure 120/69, pulse 76, temperature 98.6 F (37 C), temperature source Oral, resp. rate 20, height 6' (1.829 m), weight 166.2 kg (366 lb 6.5 oz), SpO2 98.00%. Gen: pale, morbidly obese, in and out due to pain meds.  C/o hip pain.   Lungs: poor effort, dec BS at bilateral bases CV- RRR, no MGR Abd- Morbidly obese, soft, NT Ext- difficult to tell edema, definitely obese L forearm AVF- good thrill and bruit  Assessment/Plan: 46 year old ESRD patient who now presents with a hip fracture. 1 Hip fracture- tentative plan is for surgery on Friday if INR comes down 2 ESRD: normally TTS via AVF.  Will plan on HD for today, no heparin 3 Hypertension: BP good on no meds.  Gently fluid removal as able 4. Anemia of ESRD: latest hgb 10.9, will give low dose aranesp and follow, will likely decrease over the course of the hospitalization 5. Metabolic Bone Disease:  continue renvela for phosphate binding, on no vitamin d as OP   Kassadi Presswood A 08/25/2011, 12:10 PM

## 2011-08-25 NOTE — Progress Notes (Signed)
Utilization review complete 

## 2011-08-26 ENCOUNTER — Encounter (HOSPITAL_COMMUNITY): Payer: Self-pay | Admitting: Anesthesiology

## 2011-08-26 ENCOUNTER — Encounter (HOSPITAL_COMMUNITY)
Admission: EM | Disposition: A | Discharge status: Other Acute Inpatient Hospital | Payer: Self-pay | Source: Home / Self Care | Attending: Internal Medicine

## 2011-08-26 ENCOUNTER — Inpatient Hospital Stay (HOSPITAL_COMMUNITY): Payer: BC Managed Care – PPO | Admitting: Anesthesiology

## 2011-08-26 ENCOUNTER — Inpatient Hospital Stay (HOSPITAL_COMMUNITY): Payer: BC Managed Care – PPO

## 2011-08-26 HISTORY — PX: COMPRESSION HIP SCREW: SHX1386

## 2011-08-26 LAB — PROTIME-INR
INR: 1.38 (ref 0.00–1.49)
Prothrombin Time: 17.2 seconds — ABNORMAL HIGH (ref 11.6–15.2)

## 2011-08-26 LAB — GLUCOSE, CAPILLARY

## 2011-08-26 LAB — BASIC METABOLIC PANEL
GFR calc non Af Amer: 17 mL/min — ABNORMAL LOW (ref >90)
Glucose, Bld: 97 mg/dL (ref 70–99)
Potassium: 4 mEq/L (ref 3.5–5.1)
Sodium: 137 mEq/L (ref 135–145)

## 2011-08-26 LAB — TYPE AND SCREEN: ABO/RH(D): O NEG

## 2011-08-26 SURGERY — COMPRESSION HIP
Anesthesia: General | Site: Hip | Laterality: Left | Wound class: Clean

## 2011-08-26 MED ORDER — ROCURONIUM BROMIDE 100 MG/10ML IV SOLN
INTRAVENOUS | Status: DC | PRN
  Administered 2011-08-26: 30 mg via INTRAVENOUS

## 2011-08-26 MED ORDER — CEFAZOLIN SODIUM 1-5 GM-% IV SOLN
INTRAVENOUS | Status: AC
  Filled 2011-08-26: qty 100

## 2011-08-26 MED ORDER — LIDOCAINE HCL (CARDIAC) 20 MG/ML IV SOLN
INTRAVENOUS | Status: DC | PRN
  Administered 2011-08-26: 80 mg via INTRAVENOUS

## 2011-08-26 MED ORDER — MIDAZOLAM HCL 5 MG/5ML IJ SOLN
INTRAMUSCULAR | Status: DC | PRN
  Administered 2011-08-26 (×2): 1 mg via INTRAVENOUS

## 2011-08-26 MED ORDER — ONDANSETRON HCL 4 MG/2ML IJ SOLN
INTRAMUSCULAR | Status: DC | PRN
  Administered 2011-08-26: 4 mg via INTRAVENOUS

## 2011-08-26 MED ORDER — HYDROMORPHONE 0.3 MG/ML IV SOLN
INTRAVENOUS | Status: AC
  Filled 2011-08-26: qty 25

## 2011-08-26 MED ORDER — WARFARIN - PHARMACIST DOSING INPATIENT: Freq: Every day | Status: DC

## 2011-08-26 MED ORDER — SODIUM CHLORIDE 0.9 % IV SOLN
INTRAVENOUS | Status: DC | PRN
  Administered 2011-08-26: 13:00:00 via INTRAVENOUS

## 2011-08-26 MED ORDER — SODIUM CHLORIDE 0.9 % IV SOLN
INTRAVENOUS | Status: DC | PRN
  Administered 2011-08-26: 10:00:00 via INTRAVENOUS

## 2011-08-26 MED ORDER — PROPOFOL 10 MG/ML IV EMUL
INTRAVENOUS | Status: DC | PRN
  Administered 2011-08-26 (×2): 50 mg via INTRAVENOUS
  Administered 2011-08-26: 250 mg via INTRAVENOUS

## 2011-08-26 MED ORDER — SUCCINYLCHOLINE CHLORIDE 20 MG/ML IJ SOLN
INTRAMUSCULAR | Status: DC | PRN
  Administered 2011-08-26: 140 mg via INTRAVENOUS

## 2011-08-26 MED ORDER — WARFARIN SODIUM 6 MG PO TABS
12.0000 mg | ORAL_TABLET | Freq: Once | ORAL | Status: AC
  Administered 2011-08-26: 12 mg via ORAL
  Filled 2011-08-26: qty 2

## 2011-08-26 MED ORDER — GLYCOPYRROLATE 0.2 MG/ML IJ SOLN
INTRAMUSCULAR | Status: DC | PRN
  Administered 2011-08-26: 0.6 mg via INTRAVENOUS

## 2011-08-26 MED ORDER — HYDROMORPHONE HCL PF 1 MG/ML IJ SOLN
0.5000 mg | INTRAMUSCULAR | Status: AC | PRN
  Administered 2011-08-26 (×4): 0.5 mg via INTRAVENOUS

## 2011-08-26 MED ORDER — HYDROMORPHONE HCL PF 1 MG/ML IJ SOLN: 0.5000 mg | INTRAMUSCULAR | Status: DC | PRN

## 2011-08-26 MED ORDER — HYDROMORPHONE HCL PF 1 MG/ML IJ SOLN
0.2500 mg | INTRAMUSCULAR | Status: DC | PRN
  Administered 2011-08-26 (×4): 0.5 mg via INTRAVENOUS

## 2011-08-26 MED ORDER — SODIUM CHLORIDE 0.9 % IV SOLN: INTRAVENOUS | Status: DC | PRN

## 2011-08-26 MED ORDER — FENTANYL CITRATE 0.05 MG/ML IJ SOLN
INTRAMUSCULAR | Status: DC | PRN
  Administered 2011-08-26 (×3): 50 ug via INTRAVENOUS

## 2011-08-26 MED ORDER — PHENYLEPHRINE HCL 10 MG/ML IJ SOLN
INTRAMUSCULAR | Status: DC | PRN
  Administered 2011-08-26: 40 ug via INTRAVENOUS
  Administered 2011-08-26: 80 ug via INTRAVENOUS

## 2011-08-26 MED ORDER — NEOSTIGMINE METHYLSULFATE 1 MG/ML IJ SOLN
INTRAMUSCULAR | Status: DC | PRN
  Administered 2011-08-26: 4 mg via INTRAVENOUS

## 2011-08-26 MED ORDER — ALBUMIN HUMAN 5 % IV SOLN
INTRAVENOUS | Status: DC | PRN
  Administered 2011-08-26: 13:00:00 via INTRAVENOUS

## 2011-08-26 MED ORDER — ONDANSETRON HCL 4 MG/2ML IJ SOLN: 4.0000 mg | Freq: Four times a day (QID) | INTRAMUSCULAR | Status: DC | PRN

## 2011-08-26 MED ORDER — 0.9 % SODIUM CHLORIDE (POUR BTL) OPTIME
TOPICAL | Status: DC | PRN
  Administered 2011-08-26: 1000 mL

## 2011-08-26 MED ORDER — CEFAZOLIN SODIUM 1-5 GM-% IV SOLN
INTRAVENOUS | Status: DC | PRN
  Administered 2011-08-26: 2 g via INTRAVENOUS

## 2011-08-26 SURGICAL SUPPLY — 51 items
BANDAGE GAUZE ELAST BULKY 4 IN (GAUZE/BANDAGES/DRESSINGS) IMPLANT
BIT DRILL TWIST 3.8X7 (BIT) ×2 IMPLANT
BNDG COHESIVE 4X5 TAN STRL (GAUZE/BANDAGES/DRESSINGS) IMPLANT
CLOTH BEACON ORANGE TIMEOUT ST (SAFETY) ×2 IMPLANT
DRAPE STERI IOBAN 125X83 (DRAPES) ×2 IMPLANT
DRSG ADAPTIC 3X8 NADH LF (GAUZE/BANDAGES/DRESSINGS) IMPLANT
DRSG MEPILEX BORDER 4X4 (GAUZE/BANDAGES/DRESSINGS) IMPLANT
DRSG MEPILEX BORDER 4X8 (GAUZE/BANDAGES/DRESSINGS) ×2 IMPLANT
DRSG PAD ABDOMINAL 8X10 ST (GAUZE/BANDAGES/DRESSINGS) IMPLANT
DURAPREP 26ML APPLICATOR (WOUND CARE) ×2 IMPLANT
ELECT CAUTERY BLADE 6.4 (BLADE) ×2 IMPLANT
ELECT REM PT RETURN 9FT ADLT (ELECTROSURGICAL) ×2
ELECTRODE REM PT RTRN 9FT ADLT (ELECTROSURGICAL) ×1 IMPLANT
EVACUATOR 1/8 PVC DRAIN (DRAIN) IMPLANT
GLOVE BIO SURGEON STRL SZ7 (GLOVE) ×2 IMPLANT
GLOVE BIO SURGEON STRL SZ8.5 (GLOVE) ×2 IMPLANT
GLOVE BIOGEL PI IND STRL 6.5 (GLOVE) ×1 IMPLANT
GLOVE BIOGEL PI IND STRL 8 (GLOVE) ×1 IMPLANT
GLOVE BIOGEL PI IND STRL 8.5 (GLOVE) ×1 IMPLANT
GLOVE BIOGEL PI INDICATOR 6.5 (GLOVE) ×1
GLOVE BIOGEL PI INDICATOR 8 (GLOVE) ×1
GLOVE BIOGEL PI INDICATOR 8.5 (GLOVE) ×1
GLOVE ORTHOPEDIC STR SZ6.5 (GLOVE) ×2 IMPLANT
GLOVE SS BIOGEL STRL SZ 8 (GLOVE) ×2 IMPLANT
GLOVE SUPERSENSE BIOGEL SZ 8 (GLOVE) ×2
GOWN PREVENTION PLUS XLARGE (GOWN DISPOSABLE) ×2 IMPLANT
GOWN PREVENTION PLUS XXLARGE (GOWN DISPOSABLE) ×2 IMPLANT
GOWN STRL NON-REIN LRG LVL3 (GOWN DISPOSABLE) ×2 IMPLANT
KIT BASIN OR (CUSTOM PROCEDURE TRAY) ×2 IMPLANT
KIT ROOM TURNOVER OR (KITS) ×2 IMPLANT
MANIFOLD NEPTUNE II (INSTRUMENTS) IMPLANT
NS IRRIG 1000ML POUR BTL (IV SOLUTION) ×2 IMPLANT
PACK GENERAL/GYN (CUSTOM PROCEDURE TRAY) ×2 IMPLANT
PAD ARMBOARD 7.5X6 YLW CONV (MISCELLANEOUS) ×4 IMPLANT
PIN THREADED GUIDE ACE (PIN) ×2 IMPLANT
PLATE COMP 135D 5H STD (Plate) ×2 IMPLANT
SCREW ACECAP 40MM (Screw) ×2 IMPLANT
SCREW ACECAP 44MM (Screw) ×4 IMPLANT
SCREW ACECAP 46MM (Screw) ×2 IMPLANT
SCREW ACECAP 50MM (Screw) ×2 IMPLANT
SCREW LAG 100MM (Screw) ×2 IMPLANT
SPONGE LAP 18X18 X RAY DECT (DISPOSABLE) ×2 IMPLANT
STAPLER VISISTAT 35W (STAPLE) ×2 IMPLANT
SUT VIC AB 0 CT1 27 (SUTURE) ×2
SUT VIC AB 0 CT1 27XBRD ANBCTR (SUTURE) ×2 IMPLANT
SUT VIC AB 1 CTX 27 (SUTURE) ×4 IMPLANT
SUT VIC AB 2-0 CT1 27 (SUTURE) ×2
SUT VIC AB 2-0 CT1 TAPERPNT 27 (SUTURE) ×2 IMPLANT
TOWEL OR 17X24 6PK STRL BLUE (TOWEL DISPOSABLE) ×2 IMPLANT
TOWEL OR 17X26 10 PK STRL BLUE (TOWEL DISPOSABLE) ×2 IMPLANT
WATER STERILE IRR 1000ML POUR (IV SOLUTION) ×2 IMPLANT

## 2011-08-26 NOTE — Brief Op Note (Signed)
[name] [id number] 08/26/2011   PRE-OP DIAGNOSIS: left IT fracture  POST-OP DIAGNOSIS: same  PROCEDURE: left hip ORIF with hip screw  ANESTHESIA: general  [name] G   Dictation #:  [id number]

## 2011-08-26 NOTE — Progress Notes (Signed)
Subjective:   HD last night, removed 3300, seemed to tolerate well.  Plan is for hip surgery today Objective Vital signs in last 24 hours: Filed Vitals:   08/26/11 0016 08/26/11 0416 08/26/11 0526 08/26/11 0556  BP:    123/54  Pulse:    75  Temp:    98.8 F (37.1 C)  TempSrc:    Oral  Resp: 20 16 16 18   Height:      Weight:      SpO2: 93% 98% 97% 98%   Weight change: 4.444 kg (9 lb 12.8 oz)  Intake/Output Summary (Last 24 hours) at 08/26/11 0828 Last data filed at 08/25/11 1810  Gross per 24 hour  Intake    480 ml  Output   2896 ml  Net  -2416 ml   Labs: Basic Metabolic Panel:  Lab 08/26/11 8295 08/25/11 0540 08/24/11 1030  NA 137 139 139  K 4.0 4.1 3.8  CL 98 99 97  CO2 29 29 30   GLUCOSE 97 79 120*  BUN 35* 51* 39*  CREATININE 4.06* 5.42* 4.60*  CALCIUM 9.2 9.1 9.5  ALB -- -- --  PHOS -- -- --   Liver Function Tests: No results found for this basename: AST:3,ALT:3,ALKPHOS:3,BILITOT:3,PROT:3,ALBUMIN:3 in the last 168 hours No results found for this basename: LIPASE:3,AMYLASE:3 in the last 168 hours No results found for this basename: AMMONIA:3 in the last 168 hours CBC:  Lab 08/25/11 0540 08/24/11 1030  WBC 8.6 11.2*  NEUTROABS -- 9.0*  HGB 10.9* 12.0*  HCT 34.1* 36.9*  MCV 99.7 98.1  PLT 174 228   Cardiac Enzymes: No results found for this basename: CKTOTAL:5,CKMB:5,CKMBINDEX:5,TROPONINI:5 in the last 168 hours CBG:  Lab 08/25/11 1129 08/25/11 0740 08/24/11 2313 08/24/11 1445  GLUCAP 149* 111* 166* 100*    Iron Studies: No results found for this basename: IRON,TIBC,TRANSFERRIN,FERRITIN in the last 72 hours Studies/Results: Dg Hip Complete Left  08/24/2011  *RADIOLOGY REPORT*  Clinical Data: Left hip pain post fall  LEFT HIP - COMPLETE 2+ VIEW  Comparison: None  Findings: Osseous demineralization. Nondisplaced intertrochanteric fracture left femur. No femoral head dislocation. Bony pelvis appears grossly intact. Scattered pelvic phleboliths. No other  regional bony findings identified.  IMPRESSION: Nondisplaced intertrochanteric fracture left femur.  Original Report Authenticated By: Lollie Marrow, M.D.   Dg Chest Port 1 View  08/24/2011  *RADIOLOGY REPORT*  Clinical Data: Fall, preop.  PORTABLE CHEST - 1 VIEW  Comparison: 11/05/2010.  Findings: Trachea is midline.  Heart size stable.  There is interstitial prominence and indistinctness bilaterally. Linear densities are seen in the left lower lobe.  Left costophrenic angle was not included on the film.  IMPRESSION: Pulmonary edema.  Left lower lobe subsegmental atelectasis.  Original Report Authenticated By: Reyes Ivan, M.D.   Medications: Infusions:    Scheduled Medications:    . darbepoetin (ARANESP) injection - DIALYSIS  60 mcg Intravenous Q Thu-HD  . escitalopram  20 mg Oral Daily  . Folic Acid-Vit B6-Vit B12  1 tablet Oral Daily  . HYDROmorphone PCA 0.3 mg/mL   Intravenous Q4H  . HYDROmorphone PCA 0.3 mg/mL      . pantoprazole  40 mg Oral Q1200  . phytonadione  5 mg Oral Once  . senna  1 tablet Oral BID  . sevelamer  1,600 mg Oral TID WC  . DISCONTD: calcitRIOL  1 mcg Intravenous Q T,Th,Sa-HD  . DISCONTD: darbepoetin (ARANESP) injection - DIALYSIS  60 mcg Intravenous Q Thu-HD    have  reviewed scheduled and prn medications.  Physical Exam: General: groggy due to pain meds, morbidly obese Heart: RRR Lungs: poor effort, anteriorly clear Abdomen: obese, soft Extremities: difficult to assess edema Dialysis Access: L AVF   I Assessment/ Plan: Pt is a 46 y.o. yo male ESRD who was admitted on 08/24/2011 with  Hip fracture  Assessment/Plan: 1. Hip fracture- plan for OR today.  Likely will be a long recovery road 2. ESRD- TTS via AVF- plan next HD for tomorrow  3. Anemia- hgb down some, anticipate will be down further with surgery.  On moderate dose aranesp, will follow and transfuse as needed 4. Secondary hyperparathyroidism- on renvela, not on any vitamin D as OP 5.  HTN/volume- BP good, difficult to tell volume status secondary to obesity.  Pre weight 361 lbs  [name] A   08/26/2011,8:28 AM  LOS: 2 days

## 2011-08-26 NOTE — Anesthesia Procedure Notes (Signed)
Procedure Name: Intubation Date/Time: 08/26/2011 11:23 AM Performed by: Darcey Nora B Pre-anesthesia Checklist: Patient identified, Emergency Drugs available, Suction available and Patient being monitored Patient Re-evaluated:Patient Re-evaluated prior to inductionOxygen Delivery Method: Circle system utilized Preoxygenation: Pre-oxygenation with 100% oxygen Intubation Type: IV induction Ventilation: Mask ventilation without difficulty Laryngoscope Size: Mac and 3 Grade View: Grade II Tube type: Oral Tube size: 8.0 mm Number of attempts: 2 Airway Equipment and Method: Video-laryngoscopy and Stylet Placement Confirmation: ETT inserted through vocal cords under direct vision,  positive ETCO2 and breath sounds checked- equal and bilateral (Dr. Chaney Malling (KJ unable to make curve into cords)) Secured at: 24 (cm at teeth) cm Tube secured with: Tape Dental Injury: Teeth and Oropharynx as per pre-operative assessment

## 2011-08-26 NOTE — Progress Notes (Signed)
Subjective: Patient seen and examined after returning from OR. C/o severe pain over rt hip.  Objective:  Vital signs in last 24 hours:  Filed Vitals:   08/26/11 1530 08/26/11 1545 08/26/11 1552 08/26/11 1556  BP: 123/60 120/52    Pulse: 85 87  88  Temp:   98.8 F (37.1 C)   TempSrc:      Resp: 17 11  16   Height:      Weight:      SpO2: 98% 99%  94%    Intake/Output from previous day:   Intake/Output Summary (Last 24 hours) at 08/26/11 1635 Last data filed at 08/26/11 1406  Gross per 24 hour  Intake   1240 ml  Output   3396 ml  Net  -2156 ml    Physical Exam: Morbidly obese male lying in bed with pain HEENT: no pallor, moist oral mucosa Chest : clear breath sounds b/l, no added sounds Cvs: s1 &s2 Regular no added sounds Abd: obese, laparotomy scar with a superficial skin wound which is clean and dry, Soft. Non-tender, non-distended, bowel sounds are normal Ext: dressing over surgical site, no bleeding   CNS: AAO X 3, non focal     Lab Results:  Basic Metabolic Panel:    Component Value Date/Time   NA 137 08/26/2011 0530   K 4.0 08/26/2011 0530   CL 98 08/26/2011 0530   CO2 29 08/26/2011 0530   BUN 35* 08/26/2011 0530   CREATININE 4.06* 08/26/2011 0530   GLUCOSE 97 08/26/2011 0530   CALCIUM 9.2 08/26/2011 0530   CBC:    Component Value Date/Time   WBC 8.6 08/25/2011 0540   HGB 10.9* 08/25/2011 0540   HCT 34.1* 08/25/2011 0540   PLT 174 08/25/2011 0540   MCV 99.7 08/25/2011 0540   NEUTROABS 9.0* 08/24/2011 1030   LYMPHSABS 1.5 08/24/2011 1030   MONOABS 0.7 08/24/2011 1030   EOSABS 0.0 08/24/2011 1030   BASOSABS 0.0 08/24/2011 1030    Recent Results (from the past 240 hour(s))  MRSA PCR SCREENING     Status: Normal   Collection Time   08/24/11  7:29 PM      Component Value Range Status Comment   MRSA by PCR NEGATIVE  NEGATIVE  Final     Studies/Results: Dg Hip Operative Left  08/26/2011  *RADIOLOGY REPORT*  Clinical Data: The left hip ORIF  OPERATIVE LEFT  HIP  Comparison: 08/24/2011  Findings: Three intraoperative spot fluoro films are submitted after the procedure for review.  These show placement of a dynamic hip screw.  Frontal projection shows no evidence for hardware complications.  The cross-table lateral film is limited in that the bony cortex of the femoral head is not clearly demonstrated.  IMPRESSION: Intraoperative evaluation during ORIF for intertrochanteric hip fracture.  Original Report Authenticated By: ERIC A. MANSELL, M.D.    Medications: Scheduled Meds:   . darbepoetin (ARANESP) injection - DIALYSIS  60 mcg Intravenous Q Thu-HD  . escitalopram  20 mg Oral Daily  . Folic Acid-Vit B6-Vit B12  1 tablet Oral Daily  . HYDROmorphone PCA 0.3 mg/mL   Intravenous Q4H  . HYDROmorphone PCA 0.3 mg/mL      . HYDROmorphone PCA 0.3 mg/mL      . pantoprazole  40 mg Oral Q1200  . phytonadione  5 mg Oral Once  . senna  1 tablet Oral BID  . sevelamer  1,600 mg Oral TID WC   Continuous Infusions:  PRN Meds:.ALPRAZolam, HYDROmorphone, HYDROmorphone (DILAUDID) injection,  HYDROmorphone (DILAUDID) injection, naloxone, ondansetron (ZOFRAN) IV, sodium chloride, DISCONTD: 0.9 % irrigation (POUR BTL) Assessment/Plan:   * Left Intertrochanteric fracture of femur   ORIF with hip screw done today INR stable Morphine PCA ordered by ortho  monitor wound site  will order PT and CIR consult  ESRD (end stage renal disease) on dialysis  Gets HD tu, th , sat  Renal team following   Hypertension  BP currently stable   Morbid obesity  S/P gastric sleeve in 2011   GERD (gastroesophageal reflux disease)  Cont PPI   OSA (obstructive sleep apnea)  informs being on CPAP at home but non compliant   DVT (deep venous thrombosis) of left leg  On coumadin since march 2012, supratherapetic INR, holding coumadin, improved now Resume coumadin today  ? DM  informs his DM to have resolved since 2 years  Fsg have been normal  A1C of 5.3 only   Full  code    LOS: 2 days   Aleli Navedo 08/26/2011, 4:35 PM

## 2011-08-26 NOTE — Interval H&P Note (Signed)
History and Physical Interval Note:  08/26/2011 1:46 PM  [name]  has presented today for surgery, with the diagnosis of Left Hip Fracture  The various methods of treatment have been discussed with the patient and family. After consideration of risks, benefits and other options for treatment, the patient has consented to  Procedure(s) (LRB): COMPRESSION HIP (Left) as a surgical intervention .  The patients' history has been reviewed, patient examined, no change in status, stable for surgery.  I have reviewed the patients' chart and labs.  Questions were answered to the patient's satisfaction.     [name]

## 2011-08-26 NOTE — Transfer of Care (Signed)
Immediate Anesthesia Transfer of Care Note  Patient: [name]  Procedure(s) Performed: Procedure(s) (LRB): COMPRESSION HIP (Left)  Patient Location: PACU  Anesthesia Type: General  Level of Consciousness: awake, oriented and patient cooperative  Airway & Oxygen Therapy: Patient Spontanous Breathing and Patient connected to face mask oxygen  Post-op Assessment: Report given to PACU RN, Post -op Vital signs reviewed and stable and not moving right foot  Post vital signs: Reviewed and stable  Complications: No apparent anesthesia complications

## 2011-08-26 NOTE — Progress Notes (Signed)
Pt return from OR. Pt is alert and oriented. Pt. Reinitiated on pca for pain. Sight is clean dry and intact. Will continue to monitor. Dondra Spry

## 2011-08-26 NOTE — Progress Notes (Signed)
Report given to Kay RN

## 2011-08-26 NOTE — Preoperative (Signed)
Beta Blockers   Reason not to administer Beta Blockers:Not Applicable 

## 2011-08-26 NOTE — Anesthesia Postprocedure Evaluation (Signed)
  Anesthesia Post-op Note  Patient: [name]  Procedure(s) Performed: Procedure(s) (LRB): COMPRESSION HIP (Left)  Patient Location: PACU  Anesthesia Type: General  Level of Consciousness: awake, alert , oriented and patient cooperative  Airway and Oxygen Therapy: Patient Spontanous Breathing and Patient connected to nasal cannula oxygen  Post-op Pain: moderate  Post-op Assessment: Post-op Vital signs reviewed, Patient's Cardiovascular Status Stable, Respiratory Function Stable, Patent Airway, No signs of Nausea or vomiting and Pain level controlled  Post-op Vital Signs: stable  Complications: No apparent anesthesia complications

## 2011-08-26 NOTE — Progress Notes (Signed)
ANTICOAGULATION CONSULT NOTE - Initial Consult  Pharmacy Consult for Coumadin Indication: Hx DVT  Allergies  Allergen Reactions  . Zosyn Hives    Patient Measurements: Height: 6' (182.9 cm) Weight: 337 lb 11.9 oz (153.2 kg) IBW/kg (Calculated) : 77.6  Heparin Dosing Weight:   Vital Signs: Temp: 98.6 F (37 C) (03/29 1645) Temp src: Oral (03/29 1645) BP: 118/67 mmHg (03/29 1727) Pulse Rate: 92  (03/29 1727)  Labs:  Basename 08/26/11 0530 08/25/11 0540 08/24/11 1030  HGB -- 10.9* 12.0*  HCT -- 34.1* 36.9*  PLT -- 174 228  APTT -- -- --  LABPROT 17.2* 28.7* 32.6*  INR 1.38 2.65* 3.12*  HEPARINUNFRC -- -- --  CREATININE 4.06* 5.42* 4.60*  CKTOTAL -- -- --  CKMB -- -- --  TROPONINI -- -- --   Estimated Creatinine Clearance: 35.8 ml/min (by C-G formula based on Cr of 4.06).  Medical History: Past Medical History  Diagnosis Date  . Hypertension   . GERD (gastroesophageal reflux disease)   . S/P laparoscopic cholecystectomy   . DVT (deep venous thrombosis)   . Renal disorder   . Complication of anesthesia     " I am hard  to wake up "  . Diabetes mellitus     Medications:  Prescriptions prior to admission  Medication Sig Dispense Refill  . ALPRAZolam (XANAX) 1 MG tablet Take 1 mg by mouth every 6 (six) hours as needed. anxiety      . cefdinir (OMNICEF) 300 MG capsule Take 300 mg by mouth 2 (two) times daily.       . cetirizine (ZYRTEC) 10 MG tablet Take 10 mg by mouth at bedtime. At bedtime      . epoetin alfa (EPOGEN,PROCRIT) 16109 UNIT/ML injection Inject 6,500 Units into the skin 3 (three) times a week. Receives at [facility]. Last visit 08/23/11      . escitalopram (LEXAPRO) 20 MG tablet Take 20 mg by mouth daily.      . flurazepam (DALMANE) 30 MG capsule Take 30 mg by mouth at bedtime.       . Folic Acid-Vit B6-Vit B12 (FOLBEE) 2.5-25-1 MG TABS Take 1 tablet by mouth daily.      . Heparin & NaCl Lock Flush (HEPARIN SODIUM FLUSH IV) Inject 8,600 Units  into the vein 3 (three) times a week. Flush line with 8600 units then runs 3000 units per hour during dialysis  Last treatment 08-23-11      . omeprazole (PRILOSEC) 20 MG capsule Take 20 mg by mouth 2 (two) times daily.      Marland Kitchen oxycodone (OXYCONTIN) 30 MG TB12 Take 30 mg by mouth.      . senna (SENOKOT) 8.6 MG TABS Take 1 tablet by mouth 2 (two) times daily.      . sevelamer (RENVELA) 800 MG tablet Take 1,600 mg by mouth 3 (three) times daily with meals.       . warfarin (COUMADIN) 10 MG tablet Take 10 mg by mouth daily.      Marland Kitchen warfarin (COUMADIN) 3 MG tablet Take 3 mg by mouth daily.        Assessment: 45yom s/p ORIF with hip screw to restart Coumadin for h/o DVT. Patient was admitted with supratherapeutic INR (3.12) and Coumadin has been on hold since admit (3/27). Patient reports his home regimen is 12mg  daily. - H/H low, Plts wnl - Current INR: 1.38 - No significant bleeding reported  Goal of Therapy:  INR 2-3   Plan:  1. Coumadin 12mg  po x 1 tonight 2. Daily PT/INR  Cleon Dew [phone] 08/26/2011,6:11 PM

## 2011-08-26 NOTE — Anesthesia Preprocedure Evaluation (Signed)
Anesthesia Evaluation  Patient identified by MRN, date of birth, ID band Patient awake    Reviewed: Allergy & Precautions, H&P , NPO status , Patient's Chart, lab work & pertinent test results  Airway Mallampati: III  Neck ROM: full    Dental   Pulmonary sleep apnea , Current Smoker,          Cardiovascular hypertension,     Neuro/Psych    GI/Hepatic GERD-  ,  Endo/Other  Diabetes mellitus-Morbid obesity  Renal/GU ESRFRenal disease     Musculoskeletal   Abdominal   Peds  Hematology   Anesthesia Other Findings   Reproductive/Obstetrics                           Anesthesia Physical Anesthesia Plan  ASA: III  Anesthesia Plan: General   Post-op Pain Management:    Induction: Intravenous  Airway Management Planned: Oral ETT  Additional Equipment:   Intra-op Plan:   Post-operative Plan: Extubation in OR  Informed Consent: I have reviewed the patients History and Physical, chart, labs and discussed the procedure including the risks, benefits and alternatives for the proposed anesthesia with the patient or authorized representative who has indicated his/her understanding and acceptance.     Plan Discussed with: CRNA and Surgeon  Anesthesia Plan Comments:         Anesthesia Quick Evaluation

## 2011-08-27 LAB — CBC
HCT: 26.7 % — ABNORMAL LOW (ref 39.0–52.0)
MCHC: 33 g/dL (ref 30.0–36.0)
MCV: 100 fL (ref 78.0–100.0)
Platelets: 176 10*3/uL (ref 150–400)
RDW: 18.5 % — ABNORMAL HIGH (ref 11.5–15.5)
WBC: 10.9 10*3/uL — ABNORMAL HIGH (ref 4.0–10.5)

## 2011-08-27 LAB — BASIC METABOLIC PANEL
BUN: 55 mg/dL — ABNORMAL HIGH (ref 6–23)
CO2: 25 mEq/L (ref 19–32)
Calcium: 8.7 mg/dL (ref 8.4–10.5)
Chloride: 100 mEq/L (ref 96–112)
Creatinine, Ser: 5.47 mg/dL — ABNORMAL HIGH (ref 0.50–1.35)
GFR calc Af Amer: 13 mL/min — ABNORMAL LOW (ref >90)

## 2011-08-27 MED ORDER — HYDROCODONE-ACETAMINOPHEN 10-325 MG PO TABS
1.0000 | ORAL_TABLET | ORAL | Status: DC | PRN
  Administered 2011-08-27 – 2011-08-31 (×12): 1 via ORAL
  Filled 2011-08-27 (×14): qty 1

## 2011-08-27 MED ORDER — FA-PYRIDOXINE-CYANOCOBALAMIN 2.5-25-2 MG PO TABS
1.0000 | ORAL_TABLET | Freq: Every day | ORAL | Status: DC
  Administered 2011-08-27 – 2011-09-01 (×6): 1 via ORAL
  Filled 2011-08-27 (×6): qty 1

## 2011-08-27 MED ORDER — HYDROMORPHONE HCL PF 2 MG/ML IJ SOLN
2.0000 mg | INTRAMUSCULAR | Status: DC | PRN
  Administered 2011-08-27: 2 mg via INTRAVENOUS
  Filled 2011-08-27: qty 2
  Filled 2011-08-27: qty 1

## 2011-08-27 MED ORDER — HYDROMORPHONE 0.3 MG/ML IV SOLN
INTRAVENOUS | Status: AC
  Filled 2011-08-27: qty 25

## 2011-08-27 MED ORDER — WARFARIN SODIUM 6 MG PO TABS
12.0000 mg | ORAL_TABLET | Freq: Once | ORAL | Status: AC
  Administered 2011-08-27: 12 mg via ORAL
  Filled 2011-08-27: qty 2

## 2011-08-27 NOTE — Progress Notes (Signed)
Subjective:   HD last night, removed 3300, seemed to tolerate well.  Plan is for hip surgery today Objective Vital signs in last 24 hours: Filed Vitals:   08/27/11 1000 08/27/11 1027 08/27/11 1200 08/27/11 1322  BP: 106/58 108/54 95/60   Pulse: 98 100 99   Temp:   98.6 F (37 C)   TempSrc:  Oral Oral   Resp: 18 20 20 16   Height:      Weight:      SpO2:  98% 95% 96%   Weight change: -15.9 kg (-35 lb 0.9 oz)  Intake/Output Summary (Last 24 hours) at 08/27/11 1444 Last data filed at 08/27/11 1341  Gross per 24 hour  Intake    240 ml  Output   3958 ml  Net  -3718 ml   Labs: Basic Metabolic Panel:  Lab 08/27/11 1610 08/26/11 0530 08/25/11 0540  NA 138 137 139  K 4.3 4.0 4.1  CL 100 98 99  CO2 25 29 29   GLUCOSE 100* 97 79  BUN 55* 35* 51*  CREATININE 5.47* 4.06* 5.42*  CALCIUM 8.7 9.2 9.1  ALB -- -- --  PHOS -- -- --   Liver Function Tests: No results found for this basename: AST:3,ALT:3,ALKPHOS:3,BILITOT:3,PROT:3,ALBUMIN:3 in the last 168 hours No results found for this basename: LIPASE:3,AMYLASE:3 in the last 168 hours No results found for this basename: AMMONIA:3 in the last 168 hours CBC:  Lab 08/27/11 0810 08/25/11 0540 08/24/11 1030  WBC 10.9* 8.6 11.2*  NEUTROABS -- -- 9.0*  HGB 8.8* 10.9* 12.0*  HCT 26.7* 34.1* 36.9*  MCV 100.0 99.7 98.1  PLT 176 174 228   Cardiac Enzymes: No results found for this basename: CKTOTAL:5,CKMB:5,CKMBINDEX:5,TROPONINI:5 in the last 168 hours CBG:  Lab 08/26/11 1624 08/25/11 1129 08/25/11 0740 08/24/11 2313 08/24/11 1445  GLUCAP 126* 149* 111* 166* 100*    Iron Studies: No results found for this basename: IRON,TIBC,TRANSFERRIN,FERRITIN in the last 72 hours Studies/Results: Dg Hip Operative Left  08/26/2011  *RADIOLOGY REPORT*  Clinical Data: The left hip ORIF  OPERATIVE LEFT HIP  Comparison: 08/24/2011  Findings: Three intraoperative spot fluoro films are submitted after the procedure for review.  These show placement of a  dynamic hip screw.  Frontal projection shows no evidence for hardware complications.  The cross-table lateral film is limited in that the bony cortex of the femoral head is not clearly demonstrated.  IMPRESSION: Intraoperative evaluation during ORIF for intertrochanteric hip fracture.  Original Report Authenticated By: ERIC A. MANSELL, M.D.   Medications: Infusions:    Scheduled Medications:    . darbepoetin (ARANESP) injection - DIALYSIS  60 mcg Intravenous Q Thu-HD  . escitalopram  20 mg Oral Daily  . folic acid-pyridoxine-cyancobalamin  1 tablet Oral Daily  . HYDROmorphone PCA 0.3 mg/mL      . pantoprazole  40 mg Oral Q1200  . senna  1 tablet Oral BID  . sevelamer  1,600 mg Oral TID WC  . warfarin  12 mg Oral Once  . warfarin  12 mg Oral ONCE-1800  . Warfarin - Pharmacist Dosing Inpatient   Does not apply q1800  . DISCONTD: Folic Acid-Vit B6-Vit B12  1 tablet Oral Daily  . DISCONTD: HYDROmorphone PCA 0.3 mg/mL   Intravenous Q4H    have reviewed scheduled and prn medications.  Physical Exam: General: alert morbidly obese Heart: RRR Lungs: poor effort, anteriorly clear Abdomen: obese, soft Extremities: no edema of LE's Dialysis Access: L AVF   Dialyzes at [facility] on  TTS . Primary Nephrologist [facility]. EDW 361 lbs  HD Bath 2/2, Dialyzer polyflux, Heparin yes. Access AVF. epo [id number]   I Assessment/ Plan: Pt is a 46 y.o. yo male ESRD who was admitted on 08/24/2011 with  Hip fracture  Assessment/Plan: 1. Hip fracture- s/p ORIF yesterday.  Likely will be a long recovery road 2. ESRD- TTS via AVF- HD today via AVF 3. Anemia- hgb down some, anticipate will be down further with surgery.  On moderate dose aranesp, will follow and transfuse as needed 4. Secondary hyperparathyroidism- on renvela, not on any vitamin D as OP 5. HTN/volume- BP good, difficult to tell volume status secondary to obesity.  Pre weight 361 lbs  Vinson Moselle  MD [facility] [phone] pgr    [phone] cell 08/27/2011, 2:46 PM

## 2011-08-27 NOTE — Progress Notes (Signed)
ANTICOAGULATION CONSULT NOTE - Initial Consult  Pharmacy Consult for Coumadin Indication: Hx DVT  Allergies  Allergen Reactions  . Zosyn Hives    Patient Measurements: Height: 6' (182.9 cm) Weight:  (unable to weigh) IBW/kg (Calculated) : 77.6  Heparin Dosing Weight:   Vital Signs: Temp: 98.7 F (37.1 C) (03/30 0545) Temp src: Oral (03/30 1027) BP: 108/54 mmHg (03/30 1027) Pulse Rate: 100  (03/30 1027)  Labs:  Basename 08/27/11 0810 08/26/11 0530 08/25/11 0540  HGB 8.8* -- 10.9*  HCT 26.7* -- 34.1*  PLT 176 -- 174  APTT -- -- --  LABPROT 15.9* 17.2* 28.7*  INR 1.24 1.38 2.65*  HEPARINUNFRC -- -- --  CREATININE 5.47* 4.06* 5.42*  CKTOTAL -- -- --  CKMB -- -- --  TROPONINI -- -- --   Estimated Creatinine Clearance: 26.6 ml/min (by C-G formula based on Cr of 5.47).  Medical History: Past Medical History  Diagnosis Date  . Hypertension   . GERD (gastroesophageal reflux disease)   . S/P laparoscopic cholecystectomy   . DVT (deep venous thrombosis)   . Renal disorder   . Complication of anesthesia     " I am hard  to wake up "  . Diabetes mellitus     Medications:  Prescriptions prior to admission  Medication Sig Dispense Refill  . ALPRAZolam (XANAX) 1 MG tablet Take 1 mg by mouth every 6 (six) hours as needed. anxiety      . cefdinir (OMNICEF) 300 MG capsule Take 300 mg by mouth 2 (two) times daily.       . cetirizine (ZYRTEC) 10 MG tablet Take 10 mg by mouth at bedtime. At bedtime      . epoetin alfa (EPOGEN,PROCRIT) 16109 UNIT/ML injection Inject 6,500 Units into the skin 3 (three) times a week. Receives at [facility]. Last visit 08/23/11      . escitalopram (LEXAPRO) 20 MG tablet Take 20 mg by mouth daily.      . flurazepam (DALMANE) 30 MG capsule Take 30 mg by mouth at bedtime.       . Folic Acid-Vit B6-Vit B12 (FOLBEE) 2.5-25-1 MG TABS Take 1 tablet by mouth daily.      . Heparin & NaCl Lock Flush (HEPARIN SODIUM FLUSH IV) Inject 8,600 Units into the  vein 3 (three) times a week. Flush line with 8600 units then runs 3000 units per hour during dialysis  Last treatment 08-23-11      . omeprazole (PRILOSEC) 20 MG capsule Take 20 mg by mouth 2 (two) times daily.      Marland Kitchen oxycodone (OXYCONTIN) 30 MG TB12 Take 30 mg by mouth.      . senna (SENOKOT) 8.6 MG TABS Take 1 tablet by mouth 2 (two) times daily.      . sevelamer (RENVELA) 800 MG tablet Take 1,600 mg by mouth 3 (three) times daily with meals.       . warfarin (COUMADIN) 10 MG tablet Take 10 mg by mouth daily.      Marland Kitchen warfarin (COUMADIN) 3 MG tablet Take 3 mg by mouth daily.        Assessment: 45yom s/p ORIF with hip screw restarted on Coumadin yesterday for h/o DVT. Patient was admitted with supratherapeutic INR (3.12). Patient has received 2 doses of vitamin K 5mg  since admission. INR 1.24 (down from 1.38 despite 12mg  being given). Likely d/t vitamin k administration.  No bleeding reported in notes.   Patient reports his home regimen is 12mg  daily.  Goal of Therapy:  INR 2-3   Plan:  1. Coumadin 12mg  po x 1 tonight 2. Daily PT/INR  Thank you,  Brett Fairy, PharmD Pager: [phone]  08/27/2011 11:40 AM

## 2011-08-27 NOTE — Progress Notes (Signed)
Subjective: Patient seen and examined this am during HD. informs pain better controlled with PCA  Objective:  Vital signs in last 24 hours:  Filed Vitals:   08/27/11 0830 08/27/11 0900 08/27/11 0930 08/27/11 0937  BP: 112/56 118/55 101/50 106/58  Pulse: 95 94 96 98  Temp:      TempSrc:      Resp: 20 18 20 18   Height:      Weight:      SpO2:        Intake/Output from previous day:   Intake/Output Summary (Last 24 hours) at 08/27/11 1124 Last data filed at 08/26/11 1406  Gross per 24 hour  Intake   1000 ml  Output    500 ml  Net    500 ml    Physical Exam: Morbidly obese male lying in bed with in NAD HEENT: no pallor, moist oral mucosa  Chest : clear breath sounds b/l, no added sounds  Cvs: s1 &s2 Regular no added sounds  Abd: obese, laparotomy scar with a superficial skin wound which is clean and dry, Soft. Non-tender, non-distended, bowel sounds are normal  Ext: dressing over surgical site, no bleeding , chr left foot drop CNS: AAO X 3, non focal    Lab Results:  Basic Metabolic Panel:    Component Value Date/Time   NA 138 08/27/2011 0810   K 4.3 08/27/2011 0810   CL 100 08/27/2011 0810   CO2 25 08/27/2011 0810   BUN 55* 08/27/2011 0810   CREATININE 5.47* 08/27/2011 0810   GLUCOSE 100* 08/27/2011 0810   CALCIUM 8.7 08/27/2011 0810   CBC:    Component Value Date/Time   WBC 10.9* 08/27/2011 0810   HGB 8.8* 08/27/2011 0810   HCT 26.7* 08/27/2011 0810   PLT 176 08/27/2011 0810   MCV 100.0 08/27/2011 0810   NEUTROABS 9.0* 08/24/2011 1030   LYMPHSABS 1.5 08/24/2011 1030   MONOABS 0.7 08/24/2011 1030   EOSABS 0.0 08/24/2011 1030   BASOSABS 0.0 08/24/2011 1030    Recent Results (from the past 240 hour(s))  MRSA PCR SCREENING     Status: Normal   Collection Time   08/24/11  7:29 PM      Component Value Range Status Comment   MRSA by PCR NEGATIVE  NEGATIVE  Final     Studies/Results: Dg Hip Operative Left  08/26/2011  *RADIOLOGY REPORT*  Clinical Data: The left hip  ORIF  OPERATIVE LEFT HIP  Comparison: 08/24/2011  Findings: Three intraoperative spot fluoro films are submitted after the procedure for review.  These show placement of a dynamic hip screw.  Frontal projection shows no evidence for hardware complications.  The cross-table lateral film is limited in that the bony cortex of the femoral head is not clearly demonstrated.  IMPRESSION: Intraoperative evaluation during ORIF for intertrochanteric hip fracture.  Original Report Authenticated By: ERIC A. MANSELL, M.D.    Medications: Scheduled Meds:   . darbepoetin (ARANESP) injection - DIALYSIS  60 mcg Intravenous Q Thu-HD  . escitalopram  20 mg Oral Daily  . Folic Acid-Vit B6-Vit B12  1 tablet Oral Daily  . HYDROmorphone PCA 0.3 mg/mL   Intravenous Q4H  . HYDROmorphone PCA 0.3 mg/mL      . pantoprazole  40 mg Oral Q1200  . senna  1 tablet Oral BID  . sevelamer  1,600 mg Oral TID WC  . warfarin  12 mg Oral Once  . Warfarin - Pharmacist Dosing Inpatient   Does not apply [phone]  Continuous Infusions:  PRN Meds:.ALPRAZolam, HYDROmorphone, HYDROmorphone (DILAUDID) injection, HYDROmorphone (DILAUDID) injection, naloxone, ondansetron (ZOFRAN) IV, sodium chloride, DISCONTD: 0.9 % irrigation (POUR BTL)  Assessment/Plan:  * Left Intertrochanteric fracture of femur  ORIF with hip screw done on 3/29  INR stable , resumed counmadin On dilaudid PCA ordered by ortho and pain well controlled. Monitor for sedation monitor wound site   ordered PT and CIR consult  Rest per ortho recs  ESRD (end stage renal disease) on dialysis  Gets HD tu, th , sat  Renal team following   Hypertension  BP currently stable   Morbid obesity  S/P gastric sleeve in 2011   GERD (gastroesophageal reflux disease)  Cont PPI   OSA (obstructive sleep apnea)  informs being on CPAP at home but non compliant   DVT (deep venous thrombosis) of left leg  On coumadin since march 2012, supratherapetic INR, holding coumadin,  improved now  Resumeed coumadin post op  ? DM  informs his DM to have resolved since 2 years  Fsg have been normal  A1C of 5.3 only    Full code      LOS: 3 days   [name] 08/27/2011, 11:24 AM

## 2011-08-27 NOTE — Progress Notes (Addendum)
Subjective: 1 Day Post-Op Procedure(s) (LRB): COMPRESSION HIP (Left) Patient reports pain as 4 on 0-10 scale.    Objective: Vital signs in last 24 hours: Temp:  [97.9 F (36.6 C)-99.6 F (37.6 C)] 98.7 F (37.1 C) (03/30 0545) Pulse Rate:  [71-108] 95  (03/30 0830) Resp:  [11-23] 20  (03/30 0830) BP: (111-140)/(52-81) 112/56 mmHg (03/30 0830) SpO2:  [91 %-100 %] 98 % (03/30 0610) FiO2 (%):  [3 %-92 %] 3 % (03/29 1500) Weight:  [153.2 kg (337 lb 11.9 oz)] 153.2 kg (337 lb 11.9 oz) (03/30 0545)  Intake/Output from previous day: 03/29 0701 - 03/30 0700 In: 1000 [I.V.:750; IV Piggyback:250] Out: 500 [Blood:500] Intake/Output this shift:     Basename 08/27/11 0810 08/25/11 0540 08/24/11 1030  HGB 8.8* 10.9* 12.0*    Basename 08/27/11 0810 08/25/11 0540  WBC 10.9* 8.6  RBC 2.67* 3.42*  HCT 26.7* 34.1*  PLT 176 174    Basename 08/26/11 0530 08/25/11 0540  NA 137 139  K 4.0 4.1  CL 98 99  CO2 29 29  BUN 35* 51*  CREATININE 4.06* 5.42*  GLUCOSE 97 79  CALCIUM 9.2 9.1    Basename 08/27/11 0810 08/26/11 0530  LABPT -- --  INR 1.24 1.38   Left hip : Left hip dressing benign. Has chronic left foot drop. Calf soft non tender.  Assessment/Plan: 1 Day Post-Op Procedure(s) (LRB): COMPRESSION HIP (Left) Morbid obesity. Plan: Up with therapy TDWB on left. Up to chair. Will follow. Watch HGB  Sani Loiseau G 08/27/2011, 8:51 AM

## 2011-08-27 NOTE — Evaluation (Signed)
Physical Therapy Evaluation Patient Details Name: Kashden Deboy MRN: [id number] DOB: 03-Dec-1965 Today's Date: 08/27/2011  Problem List:  Patient Active Problem List  Diagnoses  . ESRD (end stage renal disease) on dialysis  . Intertrochanteric fracture  . Hypertension  . Morbid obesity  . GERD (gastroesophageal reflux disease)  . OSA (obstructive sleep apnea)  . DVT (deep venous thrombosis)    Past Medical History:  Past Medical History  Diagnosis Date  . Hypertension   . GERD (gastroesophageal reflux disease)   . S/P laparoscopic cholecystectomy   . DVT (deep venous thrombosis)   . Renal disorder   . Complication of anesthesia     " I am hard  to wake up "  . Diabetes mellitus    Past Surgical History:  Past Surgical History  Procedure Date  . Gastric sleeve   . Cholecystectomy, laparoscopic   . Kidney stones   . Dialysis port     PT Assessment/Plan/Recommendation PT Assessment Clinical Impression Statement: Patient admitted for left hip fraction secondary to fall.  Patient fairly non-ambulatory prior to admission, and was independent with transfers to/from wheelchair.  Patient limited by size and pain.  Anticipate slow and steady gains. May benefit from CIR to return to [facility] 2 potential and discharge home. PT Recommendation/Assessment: Patient will need skilled PT in the acute care venue PT Problem List: Decreased strength;Decreased activity tolerance;Decreased mobility;Decreased knowledge of use of DME;Pain PT Therapy Diagnosis : Difficulty walking PT Plan PT Frequency: Min 4X/week PT Treatment/Interventions: DME instruction;Gait training;Functional mobility training;Therapeutic activities;Other (comment);Therapeutic exercise PT Recommendation Recommendations for Other Services: OT consult Follow Up Recommendations: Inpatient Rehab Equipment Recommended: None recommended by PT (has necessary equipment ) PT Goals  Acute Rehab PT Goals PT Goal Formulation: With  patient Time For Goal Achievement: 7 days Pt will go Supine/Side to Sit: with supervision;with HOB 0 degrees;with rail PT Goal: Supine/Side to Sit - Progress: Goal set today Pt will go Sit to Supine/Side: with supervision;with HOB 0 degrees;with rail PT Goal: Sit to Supine/Side - Progress: Goal set today Pt will go Sit to Stand: with supervision;with upper extremity assist PT Goal: Sit to Stand - Progress: Goal set today Pt will go Stand to Sit: with supervision;with upper extremity assist PT Goal: Stand to Sit - Progress: Goal set today Pt will Transfer Bed to Chair/Chair to Bed: with supervision;Other (comment) (stand pivot transfer) PT Transfer Goal: Bed to Chair/Chair to Bed - Progress: Goal set today Pt will Perform Home Exercise Program: Independently PT Goal: Perform Home Exercise Program - Progress: Goal set today  PT Evaluation Precautions/Restrictions    Prior Functioning  Home Living Lives With: Spouse;Family Receives Help From: Family Type of Home: House Home Layout: Two level;Able to live on main level with bedroom/bathroom Home Access: Ramped entrance Home Adaptive Equipment: Wheelchair - manual;Walker - rolling;Bedside commode/3-in-1 Prior Function Level of Independence: Independent with transfers (non ambulatory) Driving: Yes Vocation: On disability Cognition Cognition Arousal/Alertness: Awake/alert Overall Cognitive Status: Appears within functional limits for tasks assessed Orientation Level: Oriented X4 Sensation/Coordination   Extremity Assessment RUE Assessment RUE Assessment: Within Functional Limits LUE Assessment LUE Assessment: Within Functional Limits RLE Assessment RLE Assessment: Within Functional Limits LLE Assessment LLE Assessment: Exceptions to [facility] LLE Strength LLE Overall Strength: Deficits;Due to pain (due to surgery) Mobility (including Balance) Bed Mobility Bed Mobility: Yes Supine to Sit: 1: +2 Total assist Supine to Sit Details  (indicate cue type and reason): pt = 50%, manual facilitation to attempt sitting Sit to Supine: 1: +2  Total assist Sit to Supine - Details (indicate cue type and reason): pt = 50%, manual facilitation to control descent and repostion in bed Transfers Transfers: No Ambulation/Gait Ambulation/Gait: No Stairs: No Wheelchair Mobility Wheelchair Mobility: No  Balance Balance Assessed: No Exercise    End of Session PT - End of Session Activity Tolerance: Patient limited by pain Patient left: in bed General Behavior During Session: [facility] for tasks performed Cognition: [facility] for tasks performed  Olivia Canter, [place] [phone] 08/27/2011, 3:10 PM

## 2011-08-27 NOTE — Op Note (Signed)
NAME:  [name], [name] NO.:  MEDICAL RECORD NO.:  [id number]  LOCATION:                                 FACILITY:  PHYSICIAN:  Lubertha Basque. Charmeka Freeburg, M.D.DATE OF BIRTH:  1965-06-13  DATE OF PROCEDURE:  08/26/2011 DATE OF DISCHARGE:                              OPERATIVE REPORT   PREOPERATIVE DIAGNOSIS:  Left intertrochanteric hip fracture.  POSTOPERATIVE DIAGNOSIS:  Left intertrochanteric hip fracture.  PROCEDURE:  Left hip dynamic hip screw.  ANESTHESIA:  General.  ATTENDING SURGEON:  Lubertha Basque. [name], M.D.  ASSISTANT:  None.  INDICATION FOR PROCEDURE:  The patient is a 46 year old morbidly obese man who fell at home due to a combination of footdrop and walker use. He sustained a hip fracture and could not get up.  He was taken to the emergency room and Orthopedics was consulted for management by the medical team to which he was admitted.  He has a multitude of medical issues including end-stage renal failure, which required dialysis prior to this procedure.  An informed operative consent was obtained after discussion of possible complications including reaction to anesthesia, infection, failure of fixation, PE, and death.  SUMMARY OF FINDINGS AND PROCEDURE:  Under general anesthesia, through a lateral approach, a left hip fracture was exposed and reduced and stabilized with a TK2 System by [employer].  I used fluoroscopy throughout the case to make appropriate intraoperative decisions.  Unfortunately due to the size of the patient, we could not visualize very well on the lateral view.  I could only visualize the neck and of the head, and obviously due to the size of the patient made the operation extremely difficult.  We obtained good fixation I believe.  He was closed and admitted back to the Medicine Service.  DESCRIPTION OF PROCEDURE:  The patient was taken to the operating suite where general anesthetic was applied with some moderate  difficulty. With some great difficulty, he was positioned on the fracture table.  We used several strips of 5-inch tape to pull his stomach out of the way and tape this to the opposite side of the bed.  We also taped an elastic band up to hold some of adipose tissue away from his hip in order to be able to approach the hip.  He was placed on the fracture table and his fracture was reduced under fluoroscopic guidance.  Again, we could not get a very good lateral view, so I am honestly not completely sure about that aspect of his care, but that was due to the size of his abdomen and there was nothing we could do to improve our visualization.  He was prepped and draped in normal sterile fashion.  After administration of IV antibiotic, a lateral incision was made on the hip.  Dissection was carried down to the flare of the femur through several layers of tissue. He had a lot of fatty infiltration in the muscular tissue, which did not appear normal.  I could find no IT band fascial plane.  Nevertheless, we exposed things and I placed a guidewire with some extreme difficulty up into the middle of the femoral head.  His  leg due to his size was held on externally rotated position.  We try to internally rotate as much as we could with the fracture table but even doing that we could not bring him into neutral.  We got a guidewire, which looked perfect on the AP view and was in the femoral neck on the lateral view, but again due to his size, I could not see the femoral head.  This was then over reamed and followed by placement of a TK2 hip screw of appropriate length and depth.  We placed the corresponding side plate and used a 5-hole 135 degree variety.  This was then secured to the femoral shaft with 5 screws achieving bicortical purchase on each.  He seemed to have fairly good bone quality.  I then used fluoroscopy again to confirm placement of hardware on AP view, things looked perfect, but again  on the lateral, we could not visualize the femoral head due to the size of his body. This screw, however, did appear to be in the femoral neck on the lateral view.  We then irrigated with saline.  I reapproximated the deep tissues with #1 and more superficial tissues with 0 and 2-0 undyed Vicryl in about 5 layers.  We closed the skin with staples.  Mepilex dressing was applied.  Estimated blood loss was fairly high and can be obtained from anesthesia records.  Intraoperative fluids can also be obtained from anesthesia records.  DISPOSITION:  With extreme caution he was taken off the fracture table with 7 people assisting.  He was placed on his Aerodyne bed and taken to recovery room in stable condition.  He was extubated prior to transfer out of the operating room.  PLAN:  To be admitted back to the Medicine Service.     Lubertha Basque [name], M.D.     PGD/MEDQ  D:  08/26/2011  T:  08/26/2011  Job:  [id number]

## 2011-08-28 LAB — BASIC METABOLIC PANEL
BUN: 41 mg/dL — ABNORMAL HIGH (ref 6–23)
CO2: 30 mEq/L (ref 19–32)
Calcium: 9 mg/dL (ref 8.4–10.5)
Chloride: 101 mEq/L (ref 96–112)
Creatinine, Ser: 4.44 mg/dL — ABNORMAL HIGH (ref 0.50–1.35)

## 2011-08-28 LAB — CBC
HCT: 25.3 % — ABNORMAL LOW (ref 39.0–52.0)
Hemoglobin: 8.1 g/dL — ABNORMAL LOW (ref 13.0–17.0)
MCH: 32.1 pg (ref 26.0–34.0)
MCHC: 32 g/dL (ref 30.0–36.0)
MCV: 100.4 fL — ABNORMAL HIGH (ref 78.0–100.0)
RBC: 2.52 MIL/uL — ABNORMAL LOW (ref 4.22–5.81)

## 2011-08-28 MED ORDER — WARFARIN SODIUM 7.5 MG PO TABS
15.0000 mg | ORAL_TABLET | Freq: Once | ORAL | Status: AC
  Administered 2011-08-28: 15 mg via ORAL
  Filled 2011-08-28: qty 2

## 2011-08-28 MED ORDER — HYDROMORPHONE HCL PF 1 MG/ML IJ SOLN
2.0000 mg | INTRAMUSCULAR | Status: DC | PRN
  Administered 2011-08-28 – 2011-09-01 (×29): 2 mg via INTRAVENOUS
  Filled 2011-08-28 (×25): qty 2

## 2011-08-28 MED ORDER — OXYCODONE HCL 15 MG PO TB12
15.0000 mg | ORAL_TABLET | Freq: Two times a day (BID) | ORAL | Status: DC
  Administered 2011-08-28 – 2011-09-01 (×8): 15 mg via ORAL
  Filled 2011-08-28 (×9): qty 1

## 2011-08-28 MED ORDER — SENNA 8.6 MG PO TABS
2.0000 | ORAL_TABLET | Freq: Two times a day (BID) | ORAL | Status: DC
  Administered 2011-08-28 – 2011-09-01 (×7): 17.2 mg via ORAL
  Filled 2011-08-28 (×10): qty 2

## 2011-08-28 MED ORDER — NICOTINE 21 MG/24HR TD PT24
21.0000 mg | MEDICATED_PATCH | Freq: Every day | TRANSDERMAL | Status: DC
  Administered 2011-08-28 – 2011-09-01 (×5): 21 mg via TRANSDERMAL
  Filled 2011-08-28 (×5): qty 1

## 2011-08-28 MED ORDER — DOCUSATE SODIUM 100 MG PO CAPS
100.0000 mg | ORAL_CAPSULE | Freq: Two times a day (BID) | ORAL | Status: DC
  Administered 2011-08-28 – 2011-09-01 (×9): 100 mg via ORAL
  Filled 2011-08-28 (×12): qty 1

## 2011-08-28 NOTE — Progress Notes (Signed)
ANTICOAGULATION CONSULT NOTE - Initial Consult  Pharmacy Consult for Coumadin Indication: Hx DVT  Allergies  Allergen Reactions  . Zosyn Hives    Patient Measurements: Height: 6' (182.9 cm) Weight:  (unable to weigh) IBW/kg (Calculated) : 77.6  Heparin Dosing Weight:   Vital Signs: Temp: 98.2 F (36.8 C) (03/31 1000) Temp src: Oral (03/31 1000) BP: 119/64 mmHg (03/31 1000) Pulse Rate: 94  (03/31 1000)  Labs:  Basename 08/28/11 0554 08/27/11 0810 08/26/11 0530  HGB 8.1* 8.8* --  HCT 25.3* 26.7* --  PLT 171 176 --  APTT -- -- --  LABPROT 16.2* 15.9* 17.2*  INR 1.27 1.24 1.38  HEPARINUNFRC -- -- --  CREATININE 4.44* 5.47* 4.06*  CKTOTAL -- -- --  CKMB -- -- --  TROPONINI -- -- --   Estimated Creatinine Clearance: 32.7 ml/min (by C-G formula based on Cr of 4.44).  Medical History: Past Medical History  Diagnosis Date  . Hypertension   . GERD (gastroesophageal reflux disease)   . S/P laparoscopic cholecystectomy   . DVT (deep venous thrombosis)   . Renal disorder   . Complication of anesthesia     " I am hard  to wake up "  . Diabetes mellitus     Medications:  Prescriptions prior to admission  Medication Sig Dispense Refill  . ALPRAZolam (XANAX) 1 MG tablet Take 1 mg by mouth every 6 (six) hours as needed. anxiety      . cefdinir (OMNICEF) 300 MG capsule Take 300 mg by mouth 2 (two) times daily.       . cetirizine (ZYRTEC) 10 MG tablet Take 10 mg by mouth at bedtime. At bedtime      . epoetin alfa (EPOGEN,PROCRIT) 16109 UNIT/ML injection Inject 6,500 Units into the skin 3 (three) times a week. Receives at [facility]. Last visit 08/23/11      . escitalopram (LEXAPRO) 20 MG tablet Take 20 mg by mouth daily.      . flurazepam (DALMANE) 30 MG capsule Take 30 mg by mouth at bedtime.       . Folic Acid-Vit B6-Vit B12 (FOLBEE) 2.5-25-1 MG TABS Take 1 tablet by mouth daily.      . Heparin & NaCl Lock Flush (HEPARIN SODIUM FLUSH IV) Inject 8,600 Units into the  vein 3 (three) times a week. Flush line with 8600 units then runs 3000 units per hour during dialysis  Last treatment 08-23-11      . omeprazole (PRILOSEC) 20 MG capsule Take 20 mg by mouth 2 (two) times daily.      Marland Kitchen oxycodone (OXYCONTIN) 30 MG TB12 Take 30 mg by mouth.      . senna (SENOKOT) 8.6 MG TABS Take 1 tablet by mouth 2 (two) times daily.      . sevelamer (RENVELA) 800 MG tablet Take 1,600 mg by mouth 3 (three) times daily with meals.       . warfarin (COUMADIN) 10 MG tablet Take 10 mg by mouth daily.      Marland Kitchen warfarin (COUMADIN) 3 MG tablet Take 3 mg by mouth daily.        Assessment: 45yom s/p ORIF with hip screw restarted on Coumadin for h/o DVT. Patient was admitted with supratherapeutic INR (3.12). Patient has received 2 doses of vitamin K 5mg  since admission. INR 1.27, despite 12mg  x 2days, likely d/t vitamin k administration.  No bleeding reported in notes. CBC stable   Patient reports his home regimen is 12mg  daily. Will give higher  dose today to get closer to goal but anticipate decreasing dose thereafter.   Goal of Therapy:  INR 2-3   Plan:  1. Coumadin 15mg  po x 1 tonight 2. Daily PT/INR  Thank you,  Brett Fairy, PharmD Pager: [phone]  08/28/2011 12:03 PM

## 2011-08-28 NOTE — Progress Notes (Signed)
Subjective: Patient seen and examined this am. C/o left hip pain. No overnight issues  Objective:  Vital signs in last 24 hours:  Filed Vitals:   08/27/11 1720 08/27/11 2051 08/28/11 0529 08/28/11 1000  BP: 124/62 105/57 96/59 119/64  Pulse: 93 96 91 94  Temp: 98 F (36.7 C) 98.8 F (37.1 C) 98.2 F (36.8 C) 98.2 F (36.8 C)  TempSrc: Oral Oral Oral Oral  Resp: 16 16 18 18   Height:      Weight:      SpO2: 97% 96% 99% 91%    Intake/Output from previous day:   Intake/Output Summary (Last 24 hours) at 08/28/11 1400 Last data filed at 08/28/11 0900  Gross per 24 hour  Intake    600 ml  Output      0 ml  Net    600 ml    Physical Exam: Morbidly obese male lying in bed with in NAD  HEENT: no pallor, moist oral mucosa  Chest : clear breath sounds b/l, no added sounds  Cvs: s1 &s2 Regular no added sounds  Abd: obese, laparotomy scar with a superficial skin wound which is clean and dry, Soft. Non-tender, non-distended, bowel sounds are normal  Ext: dressing over surgical site, no bleeding , chr left foot drop  CNS: AAO X 3, non focal    Lab Results:  Basic Metabolic Panel:    Component Value Date/Time   NA 139 08/28/2011 0554   K 3.8 08/28/2011 0554   CL 101 08/28/2011 0554   CO2 30 08/28/2011 0554   BUN 41* 08/28/2011 0554   CREATININE 4.44* 08/28/2011 0554   GLUCOSE 85 08/28/2011 0554   CALCIUM 9.0 08/28/2011 0554   CBC:    Component Value Date/Time   WBC 10.9* 08/28/2011 0554   HGB 8.1* 08/28/2011 0554   HCT 25.3* 08/28/2011 0554   PLT 171 08/28/2011 0554   MCV 100.4* 08/28/2011 0554   NEUTROABS 9.0* 08/24/2011 1030   LYMPHSABS 1.5 08/24/2011 1030   MONOABS 0.7 08/24/2011 1030   EOSABS 0.0 08/24/2011 1030   BASOSABS 0.0 08/24/2011 1030    Recent Results (from the past 240 hour(s))  MRSA PCR SCREENING     Status: Normal   Collection Time   08/24/11  7:29 PM      Component Value Range Status Comment   MRSA by PCR NEGATIVE  NEGATIVE  Final      Studies/Results: No results found.  Medications: Scheduled Meds:   . darbepoetin (ARANESP) injection - DIALYSIS  60 mcg Intravenous Q Thu-HD  . escitalopram  20 mg Oral Daily  . folic acid-pyridoxine-cyancobalamin  1 tablet Oral Daily  . oxyCODONE  15 mg Oral Q12H  . pantoprazole  40 mg Oral Q1200  . senna  1 tablet Oral BID  . sevelamer  1,600 mg Oral TID WC  . warfarin  12 mg Oral ONCE-1800  . warfarin  15 mg Oral ONCE-1800  . Warfarin - Pharmacist Dosing Inpatient   Does not apply q1800   Continuous Infusions:  PRN Meds:.ALPRAZolam, HYDROcodone-acetaminophen, HYDROmorphone (DILAUDID) injection, naloxone, ondansetron (ZOFRAN) IV, sodium chloride, DISCONTD:  HYDROmorphone (DILAUDID) injection   Assessment/Plan:  * Left Intertrochanteric fracture of femur  ORIF with hip screw done on 3/29  INR stable , resumed counmadin  switched PCA to prn dilaudid, vicodin and oxycontin.  monitor wound site  ordered PT and CIR consult  Rest per ortho recs   ESRD (end stage renal disease) on dialysis  Gets HD tu, th ,  sat  Renal team following   Hypertension  BP currently stable   Morbid obesity  S/P gastric sleeve in 2011   GERD (gastroesophageal reflux disease)  Cont PPI   OSA (obstructive sleep apnea)  informs being on CPAP at home but non compliant   DVT (deep venous thrombosis) of left leg  On coumadin since march 2012, supratherapetic INR, holding coumadin, improved now  Resumeed coumadin post op   ? DM  informs his DM to have resolved since 2 years  Fsg have been normal  A1C of 5.3 only   Full code   dispo  CIR consult placed  if accepted will be discharged to inpt rehab, otherwise will need SNF     LOS: 4 days   [name] 08/28/2011, 2:00 PM

## 2011-08-28 NOTE — Progress Notes (Signed)
Physical Therapy Treatment Patient Details Name: [name] MRN: [id number] DOB: August 06, 1965 Today's Date: 08/28/2011  PT Assessment/Plan  PT - Assessment/Plan Comments on Treatment Session: attempted to stand today but unsuccessful x2 attempts & then pt requesting to lay back down.  Spoke with RN about using lift to get pt OOB>recliner.   PT Frequency: Min 4X/week Follow Up Recommendations: Inpatient Rehab PT Goals  Acute Rehab PT Goals PT Goal: Supine/Side to Sit - Progress: Not met PT Goal: Sit to Supine/Side - Progress: Not met  PT Treatment Precautions/Restrictions  Restrictions Weight Bearing Restrictions: Yes LLE Weight Bearing: Non weight bearing Mobility (including Balance) Bed Mobility Supine to Sit: 1: +2 Total assist;Other (comment) (pt=60%.  ) Supine to Sit Details (indicate cue type and reason): (A) for LE's, use of sheet to pivot hips/LE's around to EOB, (A) for shoulders/trunk to sitting upright.  Required multiple rest breaks & increased time due to pain.   Sit to Supine: 1: +2 Total assist;Other (comment) (pt=50%) Sit to Supine - Details (indicate cue type and reason): (A) for LE's, & to lower shoulders/trunk to supine.  Pt was able to reach head board & pull himself towards HOB once supine.    Posture/Postural Control Posture/Postural Control: No significant limitations Exercise    End of Session PT - End of Session Equipment Utilized During Treatment: Gait belt Activity Tolerance: Patient limited by pain Patient left: in bed Nurse Communication: Other (comment) (need of lift to transfer pt bed>chair.  ) General Behavior During Session: [facility] for tasks performed Cognition: [facility] for tasks performed  Lara Mulch 08/28/2011, 12:46 PM [phone]

## 2011-08-28 NOTE — Progress Notes (Signed)
Subjective:   HD yesterday, removed 4500, seemed to tolerate well.  Pain control is an issue Objective Vital signs in last 24 hours: Filed Vitals:   08/27/11 1720 08/27/11 2051 08/28/11 0529 08/28/11 1000  BP: 124/62 105/57 96/59 119/64  Pulse: 93 96 91 94  Temp: 98 F (36.7 C) 98.8 F (37.1 C) 98.2 F (36.8 C) 98.2 F (36.8 C)  TempSrc: Oral Oral Oral Oral  Resp: 16 16 18 18   Height:      Weight:      SpO2: 97% 96% 99% 91%   Weight change:   Intake/Output Summary (Last 24 hours) at 08/28/11 1222 Last data filed at 08/28/11 0900  Gross per 24 hour  Intake    840 ml  Output      0 ml  Net    840 ml   Labs: Basic Metabolic Panel:  Lab 08/28/11 1610 08/27/11 0810 08/26/11 0530  NA 139 138 137  K 3.8 4.3 4.0  CL 101 100 98  CO2 30 25 29   GLUCOSE 85 100* 97  BUN 41* 55* 35*  CREATININE 4.44* 5.47* 4.06*  CALCIUM 9.0 8.7 9.2  ALB -- -- --  PHOS -- -- --   Liver Function Tests: No results found for this basename: AST:3,ALT:3,ALKPHOS:3,BILITOT:3,PROT:3,ALBUMIN:3 in the last 168 hours No results found for this basename: LIPASE:3,AMYLASE:3 in the last 168 hours No results found for this basename: AMMONIA:3 in the last 168 hours CBC:  Lab 08/28/11 0554 08/27/11 0810 08/25/11 0540 08/24/11 1030  WBC 10.9* 10.9* 8.6 --  NEUTROABS -- -- -- 9.0*  HGB 8.1* 8.8* 10.9* --  HCT 25.3* 26.7* 34.1* --  MCV 100.4* 100.0 99.7 98.1  PLT 171 176 174 --   Cardiac Enzymes: No results found for this basename: CKTOTAL:5,CKMB:5,CKMBINDEX:5,TROPONINI:5 in the last 168 hours CBG:  Lab 08/27/11 1403 08/26/11 1624 08/25/11 1129 08/25/11 0740 08/24/11 2313  GLUCAP 147* 126* 149* 111* 166*    Iron Studies: No results found for this basename: IRON,TIBC,TRANSFERRIN,FERRITIN in the last 72 hours Studies/Results: Dg Hip Operative Left  08/26/2011  *RADIOLOGY REPORT*  Clinical Data: The left hip ORIF  OPERATIVE LEFT HIP  Comparison: 08/24/2011  Findings: Three intraoperative spot fluoro  films are submitted after the procedure for review.  These show placement of a dynamic hip screw.  Frontal projection shows no evidence for hardware complications.  The cross-table lateral film is limited in that the bony cortex of the femoral head is not clearly demonstrated.  IMPRESSION: Intraoperative evaluation during ORIF for intertrochanteric hip fracture.  Original Report Authenticated By: ERIC A. MANSELL, M.D.   Medications: Infusions:    Scheduled Medications:    . darbepoetin (ARANESP) injection - DIALYSIS  60 mcg Intravenous Q Thu-HD  . escitalopram  20 mg Oral Daily  . folic acid-pyridoxine-cyancobalamin  1 tablet Oral Daily  . oxyCODONE  15 mg Oral Q12H  . pantoprazole  40 mg Oral Q1200  . senna  1 tablet Oral BID  . sevelamer  1,600 mg Oral TID WC  . warfarin  12 mg Oral ONCE-1800  . warfarin  15 mg Oral ONCE-1800  . Warfarin - Pharmacist Dosing Inpatient   Does not apply q1800  . DISCONTD: Folic Acid-Vit B6-Vit B12  1 tablet Oral Daily  . DISCONTD: HYDROmorphone PCA 0.3 mg/mL   Intravenous Q4H    have reviewed scheduled and prn medications.  Physical Exam: General: alert morbidly obese Heart: RRR Lungs: poor effort, anteriorly clear Abdomen: obese, soft Extremities: no edema  of LE's Dialysis Access: L AVF   Dialyzes at [facility] on TTS . Primary Nephrologist [facility]. EDW 361 lbs  HD Bath 2/2, Dialyzer polyflux, Heparin yes. Access AVF. epo [id number]   I Assessment/ Plan: Pt is a 46 y.o. yo male ESRD who was admitted on 08/24/2011 with  Hip fracture  Assessment/Plan: 1. Hip fracture- s/p ORIF Friday.  Likely will be a long recovery road 2. ESRD- TTS via AVF-  Next treatment planned for Tuesday 3. Anemia- hgb down some, anticipate will be down further with surgery.  On moderate dose aranesp, will follow and transfuse as needed 4. Secondary hyperparathyroidism- on renvela, not on any vitamin D as OP 5. HTN/volume- BP good, difficult to tell volume status secondary to  obesity.  Pre weight 361 lbs  Tashi Band A .

## 2011-08-28 NOTE — Progress Notes (Signed)
Subjective: 2 Days Post-Op Procedure(s) (LRB): COMPRESSION HIP (Left) Patient reports pain as 7 on 0-10 scale.   Taking po ok. Slow progress with PT.  Objective: Vital signs in last 24 hours: Temp:  [98 F (36.7 C)-98.8 F (37.1 C)] 98.2 F (36.8 C) (03/31 1000) Pulse Rate:  [91-100] 94  (03/31 1000) Resp:  [16-20] 18  (03/31 1000) BP: (95-124)/(54-64) 119/64 mmHg (03/31 1000) SpO2:  [91 %-99 %] 91 % (03/31 1000)  Intake/Output from previous day: 03/30 0701 - 03/31 0700 In: 600 [P.O.:600] Out: 3958  Intake/Output this shift: Total I/O In: 240 [P.O.:240] Out: -    Basename 08/28/11 0554 08/27/11 0810  HGB 8.1* 8.8*    Basename 08/28/11 0554 08/27/11 0810  WBC 10.9* 10.9*  RBC 2.52* 2.67*  HCT 25.3* 26.7*  PLT 171 176    Basename 08/28/11 0554 08/27/11 0810  NA 139 138  K 3.8 4.3  CL 101 100  CO2 30 25  BUN 41* 55*  CREATININE 4.44* 5.47*  GLUCOSE 85 100*  CALCIUM 9.0 8.7    Basename 08/28/11 0554 08/27/11 0810  LABPT -- --  INR 1.27 1.24  Left hip exam:  Neurovascular intact Sensation intact distally Intact pulses distally Incision: dressing C/D/I Chronic left foot drop. Assessment/Plan: 2 Days Post-Op Procedure(s) (LRB): COMPRESSION HIP (Left) Morbid obesity Plan: Watch hgb Cont PT Cont coumadin  Up with therapy  Kimila Papaleo G 08/28/2011, 10:17 AM

## 2011-08-29 DIAGNOSIS — W19XXXA Unspecified fall, initial encounter: Secondary | ICD-10-CM

## 2011-08-29 DIAGNOSIS — S72143A Displaced intertrochanteric fracture of unspecified femur, initial encounter for closed fracture: Secondary | ICD-10-CM

## 2011-08-29 LAB — CBC
Hemoglobin: 7.2 g/dL — ABNORMAL LOW (ref 13.0–17.0)
MCH: 32.6 pg (ref 26.0–34.0)
MCV: 98.6 fL (ref 78.0–100.0)
RBC: 2.21 MIL/uL — ABNORMAL LOW (ref 4.22–5.81)

## 2011-08-29 LAB — BASIC METABOLIC PANEL
BUN: 59 mg/dL — ABNORMAL HIGH (ref 6–23)
CO2: 26 mEq/L (ref 19–32)
Chloride: 99 mEq/L (ref 96–112)
Creatinine, Ser: 5.74 mg/dL — ABNORMAL HIGH (ref 0.50–1.35)
GFR calc Af Amer: 13 mL/min — ABNORMAL LOW (ref >90)

## 2011-08-29 LAB — PREPARE RBC (CROSSMATCH)

## 2011-08-29 MED ORDER — HEPARIN SODIUM (PORCINE) 1000 UNIT/ML DIALYSIS
20.0000 [IU]/kg | INTRAMUSCULAR | Status: DC | PRN
  Administered 2011-08-30: 3100 [IU] via INTRAVENOUS_CENTRAL
  Filled 2011-08-29: qty 4

## 2011-08-29 MED ORDER — WARFARIN SODIUM 6 MG PO TABS
12.0000 mg | ORAL_TABLET | Freq: Once | ORAL | Status: AC
  Administered 2011-08-29: 12 mg via ORAL
  Filled 2011-08-29: qty 2

## 2011-08-29 MED ORDER — WARFARIN SODIUM 7.5 MG PO TABS
15.0000 mg | ORAL_TABLET | Freq: Once | ORAL | Status: DC
  Filled 2011-08-29: qty 2

## 2011-08-29 NOTE — Progress Notes (Signed)
Subjective: Patient seen and examined this morning. Feels fine but still has left hip pain off and on.   Objective:  Vital signs in last 24 hours:  Filed Vitals:   08/28/11 2200 08/29/11 0601 08/29/11 1000 08/29/11 1400  BP: 99/43 98/7 116/66 110/56  Pulse: 85 77 79 91  Temp: 97.6 F (36.4 C) 97.6 F (36.4 C) 98 F (36.7 C) 98.1 F (36.7 C)  TempSrc: Oral Oral Oral Oral  Resp: 20 19 19 18   Height:      Weight:      SpO2: 96% 98% 90% 91%    Intake/Output from previous day:   Intake/Output Summary (Last 24 hours) at 08/29/11 1427 Last data filed at 08/28/11 1500  Gross per 24 hour  Intake    120 ml  Output      0 ml  Net    120 ml    Physical Exam:   Morbidly obese male lying in bed with in NAD  HEENT: no pallor, moist oral mucosa  Chest : clear breath sounds b/l, no added sounds  Cvs: s1 &s2 Regular no added sounds  Abd: obese, laparotomy scar with a superficial skin wound which is clean and dry, Soft. Non-tender, non-distended, bowel sounds are normal  Ext: dressing over surgical site, no bleeding , chr left foot drop  CNS: AAO X 3, non focal   Lab Results:  Basic Metabolic Panel:    Component Value Date/Time   NA 136 08/29/2011 0536   K 4.4 08/29/2011 0536   CL 99 08/29/2011 0536   CO2 26 08/29/2011 0536   BUN 59* 08/29/2011 0536   CREATININE 5.74* 08/29/2011 0536   GLUCOSE 87 08/29/2011 0536   CALCIUM 9.0 08/29/2011 0536   CBC:    Component Value Date/Time   WBC 11.8* 08/29/2011 0536   HGB 7.2* 08/29/2011 0536   HCT 21.8* 08/29/2011 0536   PLT 188 08/29/2011 0536   MCV 98.6 08/29/2011 0536   NEUTROABS 9.0* 08/24/2011 1030   LYMPHSABS 1.5 08/24/2011 1030   MONOABS 0.7 08/24/2011 1030   EOSABS 0.0 08/24/2011 1030   BASOSABS 0.0 08/24/2011 1030    Recent Results (from the past 240 hour(s))  MRSA PCR SCREENING     Status: Normal   Collection Time   08/24/11  7:29 PM      Component Value Range Status Comment   MRSA by PCR NEGATIVE  NEGATIVE  Final      Studies/Results: No results found.  Medications: Scheduled Meds:   . darbepoetin (ARANESP) injection - DIALYSIS  60 mcg Intravenous Q Thu-HD  . docusate sodium  100 mg Oral BID  . escitalopram  20 mg Oral Daily  . folic acid-pyridoxine-cyancobalamin  1 tablet Oral Daily  . nicotine  21 mg Transdermal Daily  . oxyCODONE  15 mg Oral Q12H  . pantoprazole  40 mg Oral Q1200  . senna  2 tablet Oral BID  . sevelamer  1,600 mg Oral TID WC  . warfarin  15 mg Oral ONCE-1800  . warfarin  15 mg Oral ONCE-1800  . Warfarin - Pharmacist Dosing Inpatient   Does not apply q1800  . DISCONTD: senna  1 tablet Oral BID   Continuous Infusions:  PRN Meds:.ALPRAZolam, heparin, HYDROcodone-acetaminophen, HYDROmorphone (DILAUDID) injection, naloxone, ondansetron (ZOFRAN) IV, sodium chloride  Assessment/Plan:  * Left Intertrochanteric fracture of femur  ORIF with hip screw done on 3/29  INR stable , resumed counmadin  switched PCA to prn dilaudid, vicodin and oxycontin.  monitor  wound site  ordered PT and CIR consult  Rest per ortho recs   ESRD (end stage renal disease) on dialysis  Gets HD tu, th , sat  Renal team following    Anemia Hb dropped to 7.1 today  asymptomatic  planned for pRBC transfusion  Hypertension  BP currently stable   Morbid obesity  S/P gastric sleeve in 2011   GERD (gastroesophageal reflux disease)  Cont PPI   OSA (obstructive sleep apnea)  informs being on CPAP at home but non compliant   DVT (deep venous thrombosis) of left leg  On coumadin since march 2012, supratherapetic INR, holding coumadin, improved now  Resumeed coumadin post op   ? DM  informs his DM to have resolved since 2 years  Fsg have been normal  A1C of 5.3 only   Full code   dispo  CIR consult placed and accepted will be discharged to inpt rehab tomorrow       LOS: 5 days   Kairie Vangieson 08/29/2011, 2:27 PM

## 2011-08-29 NOTE — Progress Notes (Signed)
Physical Therapy Treatment Patient Details Name: [name] MRN: [id number] DOB: [birth year] Today's Date: 08/29/2011  PT Assessment/Plan  PT - Assessment/Plan Comments on Treatment Session: Pt continues to be limited by pain in L hip.  Less assistance need for bed mobility but pt still unable to stand.  PT Plan: Discharge plan remains appropriate;Frequency remains appropriate PT Frequency: Min 4X/week Recommendations for Other Services: OT consult Follow Up Recommendations: Inpatient Rehab Equipment Recommended: None recommended by PT PT Goals  Acute Rehab PT Goals PT Goal Formulation: With patient Time For Goal Achievement: 2 weeks (From 08/27/11) Pt will go Supine/Side to Sit: with supervision;with HOB 0 degrees;with rail PT Goal: Supine/Side to Sit - Progress: Progressing toward goal Pt will go Sit to Supine/Side: with supervision;with HOB 0 degrees;with rail PT Goal: Sit to Supine/Side - Progress: Progressing toward goal Pt will go Sit to Stand: with supervision;with upper extremity assist PT Goal: Sit to Stand - Progress: Not progressing Pt will go Stand to Sit: with supervision;with upper extremity assist PT Goal: Stand to Sit - Progress: Not progressing Pt will Transfer Bed to Chair/Chair to Bed: with supervision;Other (comment) PT Transfer Goal: Bed to Chair/Chair to Bed - Progress: Not progressing Pt will Perform Home Exercise Program: Independently PT Goal: Perform Home Exercise Program - Progress: Progressing toward goal  PT Treatment Precautions/Restrictions  Restrictions Weight Bearing Restrictions: Yes LLE Weight Bearing: Non weight bearing Mobility (including Balance) Bed Mobility Bed Mobility: Yes Supine to Sit: 2: Max assist;Patient percentage (comment) (Pt 40%) Supine to Sit Details (indicate cue type and reason): Manual facilitation to manage Lt LE.  Verbal cues for technique including sequencing and use of Rt bedrail.  Sit to Supine: 3: Mod assist;HOB flat  (Pt 50%) Sit to Supine - Details (indicate cue type and reason): Assist to manage L LE, Verbal cues for technique.   Transfers Transfers: No Ambulation/Gait Ambulation/Gait: No Stairs: No Wheelchair Mobility Wheelchair Mobility: No  Posture/Postural Control Posture/Postural Control: No significant limitations Balance Balance Assessed: Yes Static Sitting Balance Static Sitting - Balance Support: Feet supported;Bilateral upper extremity supported Static Sitting - Level of Assistance: 5: Stand by assistance Static Sitting - Comment/# of Minutes: several minutes sitting on EOB with no LOB.  Exercise  Total Joint Exercises Ankle Circles/Pumps: AROM;10 reps;Supine Quad Sets: 5 reps;Supine;Left Heel Slides: AAROM;Left;10 reps;Supine End of Session PT - End of Session Equipment Utilized During Treatment: Gait belt Activity Tolerance: Patient limited by pain Patient left: in bed Nurse Communication: Other (comment) (need of lift to transfer pt bed>chair) General Behavior During Session: [facility] for tasks performed Cognition: [facility] for tasks performed  [name] 08/29/2011, 4:13 PM [name] L. Rosendo Couser DPT [phone]

## 2011-08-29 NOTE — Progress Notes (Addendum)
Patient ID: [name], male   DOB: 23-Feb-1966, 46 y.o.   MRN: [id number] PATIENT ID: [name]  MRN: [id number]  DOB/AGE:  11-13-1965 / 46 y.o.  3 Days Post-Op Procedure(s) (LRB): COMPRESSION HIP (Left)    PROGRESS NOTE Subjective: Patient is alert, oriented,noNausea, no Vomiting, yes passing gas, no Bowel Movement. Taking PO well. Denies SOB, Chest or Calf Pain. Using [employer]. Unable to ambulate yet secondary to pain and body habitus. Patient reports pain as 10 on 0-10 scale, taking pain medications every 2 hours, pretty much continuously. .    Objective: Vital signs in last 24 hours: Filed Vitals:   08/28/11 1400 08/28/11 1652 08/28/11 2200 08/29/11 0601  BP: 93/47 116/51 99/43 98/7  Pulse: 102 95 85 77  Temp: 98 F (36.7 C) 98.7 F (37.1 C) 97.6 F (36.4 C) 97.6 F (36.4 C)  TempSrc: Oral Oral Oral Oral  Resp: 18 18 20 19   Height:      Weight:      SpO2: 91% 93% 96% 98%      Intake/Output from previous day: I/O last 3 completed shifts: In: 600 [P.O.:600] Out: -    Intake/Output this shift:     LABORATORY DATA:  Basename 08/29/11 0536 08/28/11 0554 08/27/11 1403 08/26/11 1624  WBC 11.8* 10.9* -- --  HGB 7.2* 8.1* -- --  HCT 21.8* 25.3* -- --  PLT 188 171 -- --  NA 136 139 -- --  K 4.4 3.8 -- --  CL 99 101 -- --  CO2 26 30 -- --  BUN 59* 41* -- --  CREATININE 5.74* 4.44* -- --  GLUCOSE 87 85 -- --  GLUCAP -- -- 147* 126*  INR 1.41 1.27 -- --  CALCIUM 9.0 -- -- --    Examination: Neurologically intact ABD soft Sensation intact distally Incision: no drainage No cellulitis present Compartment soft} On the left side the patient has a chronic foot drop from on the other surgery that was done years ago at [facility]. XR AP&Lat of hip shows well placed the DHS hip screw on the AP, on the lateral screw is seen entering the femoral neck but you cannot visualize the head because of body habitus   Assessment:   3 Days Post-Op  Procedure(s) (LRB): COMPRESSION HIP (Left) ADDITIONAL DIAGNOSIS:  Acute Blood Loss Anemia, Diabetes, Hypertension and Renal Insufficiency Chronic, chronic morbid super obesity with a BMI that is in excess of 50.  Plan: PT/OT, 20 pounds weight bearing on the left lower trembly. The patient should only be out of bed with a physical therapist attached to him using a walker. Discussed the need for short-term nursing home placement with the patient, but as for now he was still wants to try to go home. In my opinion that we'll simply not be possible and I told that to the patient. DVT Prophylaxis: SCDx72hr\Coumadin x 2weeks, monitor INR 1.5-2.0 target Patient will need blood transfusion for hemoglobin of 7.1, it looks like this will be accomplished during dialysis per nephrology. DISCHARGE PLAN: Skilled Nursing Facility/Rehab  DISCHARGE NEEDS: HHPT, HHRN, CPM, Walker and 3-in-1 comode seat

## 2011-08-29 NOTE — Progress Notes (Signed)
 .    [facility] Progress Note  Assessment/ Plan:  Pt is a 46 y.o. yo male ESRD who was admitted on 08/24/2011 with Hip fracture  Assessment/Plan:  1. Hip fracture- s/p ORIF Friday. 2. ESRD- TTS via AVF- Next treatment planned for Tuesday  3. Anemia- progressive, transfuse with dialysis  4. Secondary hyperparathyroidism- on renvela, not on any vitamin D as OP  5. HTN/volume- BP low prob due to ABLA, difficult to tell volume status secondary to obesity. Pre weight 361 lbs   Subjective:   Sleepy after pain medication   Objective:   BP 98/7  Pulse 77  Temp(Src) 97.6 F (36.4 C) (Oral)  Resp 19  Ht 6' (1.829 m)  Wt 153.2 kg (337 lb 11.9 oz)  BMI 45.81 kg/m2  SpO2 98%  Intake/Output Summary (Last 24 hours) at 08/29/11 0738 Last data filed at 08/28/11 1500  Gross per 24 hour  Intake    600 ml  Output      0 ml  Net    600 ml   Weight change:   Physical Exam: GNF:AOZHY male, sleepy Resp:comfortable QMV:HQIO Ext:2+ edema BLE  Imaging: No results found.  Labs: BMET  Lab 08/29/11 0536 08/28/11 0554 08/27/11 0810 08/26/11 0530 08/25/11 0540 08/24/11 1030  NA 136 139 138 137 139 139  K 4.4 3.8 4.3 4.0 4.1 3.8  CL 99 101 100 98 99 97  CO2 26 30 25 29 29 30   GLUCOSE 87 85 100* 97 79 120*  BUN 59* 41* 55* 35* 51* 39*  CREATININE 5.74* 4.44* 5.47* 4.06* 5.42* 4.60*  ALB -- -- -- -- -- --  CALCIUM 9.0 9.0 8.7 9.2 9.1 9.5  PHOS -- -- -- -- -- --   CBC  Lab 08/29/11 0536 08/28/11 0554 08/27/11 0810 08/25/11 0540 08/24/11 1030  WBC 11.8* 10.9* 10.9* 8.6 --  NEUTROABS -- -- -- -- 9.0*  HGB 7.2* 8.1* 8.8* 10.9* --  HCT 21.8* 25.3* 26.7* 34.1* --  MCV 98.6 100.4* 100.0 99.7 --  PLT 188 171 176 174 --    Medications:      . darbepoetin (ARANESP) injection - DIALYSIS  60 mcg Intravenous Q Thu-HD  . docusate sodium  100 mg Oral BID  . escitalopram  20 mg Oral Daily  . folic acid-pyridoxine-cyancobalamin  1 tablet Oral Daily  . nicotine  21 mg  Transdermal Daily  . oxyCODONE  15 mg Oral Q12H  . pantoprazole  40 mg Oral Q1200  . senna  2 tablet Oral BID  . sevelamer  1,600 mg Oral TID WC  . warfarin  15 mg Oral ONCE-1800  . Warfarin - Pharmacist Dosing Inpatient   Does not apply q1800  . DISCONTD: senna  1 tablet Oral BID

## 2011-08-29 NOTE — Consult Note (Signed)
Physical Medicine and Rehabilitation Consult Reason for Consult left intertrochanteric hip fracture Referring Phsyician: [name] is an 46 y.o. male.   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 In addition he has a chronic knee week right great toe with decreased sensation in the right foot following one of his hospitalizations. Of note is that he lost approximately 400 pounds after his gastric I. past surgery. At baseline he was able to ambulate 50 feet with limited endurance. Admitted March 27 after a fall at home sustaining a left intertrochanteric hip fracture. His Coumadin was held until INR less than 2.0 and underwent left hip O. production internal fixation with hip screw March 29 per Dr. [name]. Nonweightbearing left lower extremity for 4-6 weeks. Chronic Coumadin has been resumed for history of DVT. Dialysis as advised per [facility]. Noted anemia 7.2 April 1 question plan for transfusion. Physical therapy ongoing with recommendations for physical medicine rehabilitation consult to consider inpatient rehabilitation services. The patient used a slide board for transfers at home. Ambulated very limited the distances from his bed to his wheelchair using a walker. Review of Systems  Respiratory: Positive for shortness of breath.   Gastrointestinal: Positive for constipation.  Musculoskeletal: Positive for myalgias and back pain.  Psychiatric/Behavioral: Positive for depression.  All other systems reviewed and are negative.   Past Medical History  Diagnosis Date  . Hypertension   . GERD (gastroesophageal reflux disease)   . S/P laparoscopic cholecystectomy   . DVT (deep venous  thrombosis)   . Renal disorder   . Complication of anesthesia     " I am hard  to wake up "  . Diabetes mellitus    Past Surgical History  Procedure Date  . Gastric sleeve   . Cholecystectomy, laparoscopic   . Kidney stones   . Dialysis port    History reviewed. No pertinent family history. Social History:  reports that he has been smoking Cigarettes.  He has never used smokeless tobacco. He reports that he does not drink alcohol or use illicit drugs. Allergies:  Allergies  Allergen Reactions  . Zosyn Hives   Medications Prior to Admission  Medication Dose Route Frequency Provider Last Rate Last Dose  . ALPRAZolam (XANAX) tablet 1 mg  1 mg Oral TID PRN Eddie North, MD   1 mg at 08/28/11 2201  . darbepoetin (ARANESP) injection 60 mcg  60 mcg Intravenous Q Thu-HD Sadie Haber, MD   60 mcg at 08/25/11 1454  . docusate sodium (COLACE) capsule 100 mg  100 mg Oral BID Nishant Dhungel, MD   100 mg at 08/28/11 2201  . escitalopram (LEXAPRO) tablet 20 mg  20 mg Oral Daily Nishant Dhungel, MD   20 mg at 08/28/11 0939  . folic acid-pyridoxine-cyancobalamin (FOLTX) 2.5-25-2 MG per tablet 1 tablet  1 tablet Oral Daily Nishant Dhungel, MD   1 tablet at 08/28/11 0939  . HYDROcodone-acetaminophen (NORCO) 10-325 MG per tablet 1 tablet  1 tablet Oral Q4H PRN Eddie North, MD   1 tablet at 08/29/11 0118  . HYDROmorphone (DILAUDID) injection 0.5 mg  0.5 mg Intravenous Q5 min PRN Rivka Barbara, MD   0.5 mg at 08/26/11 1551  . HYDROmorphone (DILAUDID) injection 1 mg  1 mg Intravenous Once  Joya Gaskins, MD   1 mg at 08/24/11 1035  . HYDROmorphone (DILAUDID) injection 1 mg  1 mg Intravenous Once Joya Gaskins, MD   1 mg at 08/24/11 1113  . HYDROmorphone (DILAUDID) injection 2 mg  2 mg Intravenous Q3H PRN Nishant Dhungel, MD   2 mg at 08/29/11 0540  . HYDROmorphone PCA 0.3 mg/mL (DILAUDID) 0.3 mg/mL infusion           . HYDROmorphone PCA 0.3 mg/mL (DILAUDID) 0.3 mg/mL infusion            . naloxone [facility]) injection 0.4 mg  0.4 mg Intravenous PRN Caroline More, NP       And  . sodium chloride 0.9 % injection 9 mL  9 mL Intravenous PRN Caroline More, NP      . nicotine (NICODERM CQ - dosed in mg/24 hours) patch 21 mg  21 mg Transdermal Daily Nishant Dhungel, MD   21 mg at 08/28/11 1443  . ondansetron (ZOFRAN) injection 4 mg  4 mg Intravenous Q6H PRN Raiford Simmonds, MD      . oxyCODONE (OXYCONTIN) 12 hr tablet 15 mg  15 mg Oral Q12H Nishant Dhungel, MD   15 mg at 08/28/11 2201  . pantoprazole (PROTONIX) EC tablet 40 mg  40 mg Oral Q1200 Nishant Dhungel, MD   40 mg at 08/28/11 1235  . phytonadione (VITAMIN K) tablet 5 mg  5 mg Oral Once Joya Gaskins, MD   5 mg at 08/24/11 1351  . phytonadione (VITAMIN K) tablet 5 mg  5 mg Oral Once Nishant Dhungel, MD   5 mg at 08/25/11 2023  . senna (SENOKOT) tablet 17.2 mg  2 tablet Oral BID Nishant Dhungel, MD   17.2 mg at 08/28/11 2201  . sevelamer (RENVELA) tablet 1,600 mg  1,600 mg Oral TID WC Nishant Dhungel, MD   1,600 mg at 08/28/11 1720  . warfarin (COUMADIN) tablet 12 mg  12 mg Oral Once Dannielle Karvonen [place], PHARMD   12 mg at 08/26/11 2053  . warfarin (COUMADIN) tablet 12 mg  12 mg Oral ONCE-1800 Nishant Dhungel, MD   12 mg at 08/27/11 1744  . warfarin (COUMADIN) tablet 15 mg  15 mg Oral ONCE-1800 Nishant Dhungel, MD   15 mg at 08/28/11 1721  . Warfarin - Pharmacist Dosing Inpatient   Does not apply q1800 Dannielle Karvonen [place], PHARMD      . DISCONTD: 0.9 % irrigation (POUR BTL)    PRN Velna Ochs, MD   1,000 mL at 08/26/11 1208  . DISCONTD: calcitRIOL (CALCIJEX) 1 MCG/ML injection 1 mcg  1 mcg Intravenous Q T,Th,Sa-HD Cecille Aver, MD      . DISCONTD: darbepoetin (ARANESP) injection 60 mcg  60 mcg Intravenous Q Thu-HD Cecille Aver, MD      . DISCONTD: Folic Acid-Vit B6-Vit B12 (FOLBEE) tablet 1 tablet  1 tablet Oral Daily Nishant Dhungel, MD   1 tablet at 08/26/11 1723  . DISCONTD:  HYDROcodone-acetaminophen (NORCO) 5-325 MG per tablet 1-2 tablet  1-2 tablet Oral Q4H PRN Nishant Dhungel, MD      . DISCONTD: HYDROmorphone (DILAUDID) injection 0.25-0.5 mg  0.25-0.5 mg Intravenous Q5 min PRN Raiford Simmonds, MD   0.5 mg at 08/26/11 1449  . DISCONTD: HYDROmorphone (DILAUDID) injection 0.5 mg  0.5 mg Intravenous Q10 min PRN Rivka Barbara, MD      . DISCONTD: HYDROmorphone (DILAUDID) injection 1 mg  1 mg Intravenous Q1H PRN  Joya Gaskins, MD   1 mg at 08/24/11 1943  . DISCONTD: HYDROmorphone (DILAUDID) injection 2 mg  2 mg Intravenous Q3H PRN Nishant Dhungel, MD   2 mg at 08/27/11 2341  . DISCONTD: HYDROmorphone (DILAUDID) PCA injection 0.3 mg/mL   Intravenous Q4H Caroline More, NP   3 mg at 08/27/11 0406  . DISCONTD: HYDROmorphone PCA 0.3 mg/mL (DILAUDID) 0.3 mg/mL infusion           . DISCONTD: morphine 2 MG/ML injection 2 mg  2 mg Intravenous Q4H PRN Nishant Dhungel, MD      . DISCONTD: senna (SENOKOT) tablet 8.6 mg  1 tablet Oral BID Nishant Dhungel, MD   8.6 mg at 08/28/11 1610   No current outpatient prescriptions on file as of 08/29/2011.    Home: Home Living Lives With: Spouse;Family Receives Help From: Family Type of Home: House Home Layout: Two level;Able to live on main level with bedroom/bathroom Home Access: Ramped entrance Home Adaptive Equipment: Wheelchair - manual;Walker - rolling;Bedside commode/3-in-1  Functional History: Prior Function Level of Independence: Independent with transfers (non ambulatory) Driving: Yes Vocation: On disability Functional Status:  Mobility: Bed Mobility Bed Mobility: Yes Supine to Sit: 1: +2 Total assist;Other (comment) (pt=60%.  ) Supine to Sit Details (indicate cue type and reason): (A) for LE's, use of sheet to pivot hips/LE's around to EOB, (A) for shoulders/trunk to sitting upright.  Required multiple rest breaks & increased time due to pain.   Sit to Supine: 1: +2 Total assist;Other (comment) (pt=50%) Sit  to Supine - Details (indicate cue type and reason): (A) for LE's, & to lower shoulders/trunk to supine.  Pt was able to reach head board & pull himself towards HOB once supine.   Transfers Transfers: No Ambulation/Gait Ambulation/Gait: No Stairs: No Wheelchair Mobility Wheelchair Mobility: No  ADL:    Cognition: Cognition Arousal/Alertness: Awake/alert Orientation Level: Oriented X4 Cognition Arousal/Alertness: Awake/alert Overall Cognitive Status: Appears within functional limits for tasks assessed Orientation Level: Oriented X4  Blood pressure 98/7, pulse 77, temperature 97.6 F (36.4 C), temperature source Oral, resp. rate 19, height 6' (1.829 m), weight 153.2 kg (337 lb 11.9 oz), SpO2 98.00%. Physical Exam  Constitutional: He is oriented to person, place, and time.       46 year old right-handed morbidly obese male  HENT:  Head: Normocephalic.  Neck: Normal range of motion. Neck supple. No thyromegaly present.  Cardiovascular: Regular rhythm.   Pulmonary/Chest: Breath sounds normal. He has no wheezes.  Abdominal: He exhibits no distension. There is no tenderness.  Neurological: He is alert and oriented to person, place, and time.  Skin:       Hip incision clean and dry  Psychiatric: He has a normal mood and affect.   upper extremities have 5/5 strength in the deltoid, biceps, triceps, grip Right lower extremity has 3 minus over 5 strength in hip flexor, 4 minus at the knee extensors 0/5 in the EHL and 4/5 in the TA Left lower extremity has 1/5 in the hip flexor knee extensor and 0/5 and ankle dorsiflexor and 2/5 in ankle plantar flexor Sensation is reduced over the right great toe with both light touch and proprioception. The left great toe and left dorsum of the foot have absent sensation to light touch in proprioception Fine motor skills are normal in the upper extremities  Results for orders placed during the hospital encounter of 08/24/11 (from the past 24 hour(s))    CBC     Status: Abnormal  Collection Time   08/29/11  5:36 AM      Component Value Range   WBC 11.8 (*) 4.0 - 10.5 (K/uL)   RBC 2.21 (*) 4.22 - 5.81 (MIL/uL)   Hemoglobin 7.2 (*) 13.0 - 17.0 (g/dL)   HCT 16.1 (*) 09.6 - 52.0 (%)   MCV 98.6  78.0 - 100.0 (fL)   MCH 32.6  26.0 - 34.0 (pg)   MCHC 33.0  30.0 - 36.0 (g/dL)   RDW 04.5 (*) 40.9 - 15.5 (%)   Platelets 188  150 - 400 (K/uL)   No results found.  Assessment/Plan: Diagnosis: Left intertrochanteric femur fracture after a fall with ORIF and non-weightbearing left lower extremity postoperative day #3 1. Does the need for close, 24 hr/day medical supervision in concert with the patient's rehab needs make it unreasonable for this patient to be served in a less intensive setting? Potentially 2. Co-Morbidities requiring supervision/potential complications: Morbid obesity, end-stage renal disease, left common peroneal nerve injury, right deep peroneal nerve injury 3. Due to bowel management, safety, skin/wound care, disease management, medication administration, pain management and patient education, does the patient require 24 hr/day rehab nursing? Potentially 4. Does the patient require coordinated care of a physician, rehab nurse, PT (1-2 hrs/day, 5 days/week) and OT (1-2 hrs/day, 5 days/week) to address physical and functional deficits in the context of the above medical diagnosis(es)? Potentially Addressing deficits in the following areas: balance, endurance, locomotion, strength, transferring, bathing, dressing and toileting 5. Can the patient actively participate in an intensive therapy program of at least 3 hrs of therapy per day at least 5 days per week? Potentially 6. The potential for patient to make measurable gains while on inpatient rehab is good and fair 7. Anticipated functional outcomes upon discharge from inpatients are min assist wheelchair level mobility PT, min assist lower body ADLs with modified independent upper body  OT, not applicable SLP 8. Estimated rehab length of stay to reach the above functional goals is: 2 weeks 9. Does the patient have adequate social supports to accommodate these discharge functional goals? Potentially 10. Anticipated D/C setting: Home 11. Anticipated post D/C treatments: HH therapy 12. Overall Rehab/Functional Prognosis: good and fair  RECOMMENDATIONS: This patient's condition is appropriate for continued rehabilitative care in the following setting: At this point the patient cannot tolerate an acute inpatient rehabilitation program. Rehabilitation RN to monitor therapy progress and if patient able to maintain his weightbearing precautions during transfers, I think it would be reasonable to admit him to CIR level therapies. Patient has agreed to participate in recommended program. Yes Note that insurance prior authorization may be required for reimbursement for recommended care.  Comment: Patient states that he refuses skilled nursing placement   ANGIULLI,DANIEL J. 08/29/2011

## 2011-08-29 NOTE — Progress Notes (Addendum)
ANTICOAGULATION CONSULT NOTE - Follow Up Consult  Pharmacy Consult for coumadin Indication: Hx DVT  Allergies  Allergen Reactions  . Zosyn Hives    Patient Measurements: Height: 6' (182.9 cm) Weight:  (unable to weigh) IBW/kg (Calculated) : 77.6  Heparin Dosing Weight:   Vital Signs: Temp: 98 F (36.7 C) (04/01 1000) Temp src: Oral (04/01 1000) BP: 116/66 mmHg (04/01 1000) Pulse Rate: 79  (04/01 1000)  Labs:  Basename 08/29/11 0536 08/28/11 0554 08/27/11 0810  HGB 7.2* 8.1* --  HCT 21.8* 25.3* 26.7*  PLT 188 171 176  APTT -- -- --  LABPROT 17.5* 16.2* 15.9*  INR 1.41 1.27 1.24  HEPARINUNFRC -- -- --  CREATININE 5.74* 4.44* 5.47*  CKTOTAL -- -- --  CKMB -- -- --  TROPONINI -- -- --   Estimated Creatinine Clearance: 25.3 ml/min (by C-G formula based on Cr of 5.74).  Assessment: Patient is a 46 y.o M on coumadin PTA for hx DVT and now s/p hip ORIF.  INR is subtherapeutic but is trending up.  Hbg down to 7.2 with no bleeding noted.  Dr. Turner Daniels, your note today indicated plan to treat with coumadin for 2 weeks with INR goal of 1.5-2. Patient was on coumadin prior to Adm for hx of DVT. Please clarify if 2 weeks is still the plan for treatment or should patient be on it longer. Notify us if/when INR goal range can be increased to 2-3 for hx DVT indication.  Goal of Therapy:  INR 1.5-2 (per Dr. Wadie Lessen note on 4/01)   Plan:  1) With lower INR goal and drop in hgb, will give coumadin 12mg  pox1 today  Alaiya Martindelcampo P 08/29/2011,10:24 AM

## 2011-08-30 ENCOUNTER — Encounter (HOSPITAL_COMMUNITY): Payer: Self-pay | Admitting: Orthopaedic Surgery

## 2011-08-30 ENCOUNTER — Inpatient Hospital Stay (HOSPITAL_COMMUNITY): Payer: BC Managed Care – PPO

## 2011-08-30 LAB — BASIC METABOLIC PANEL
Chloride: 97 mEq/L (ref 96–112)
GFR calc Af Amer: 10 mL/min — ABNORMAL LOW (ref >90)
Potassium: 4.5 mEq/L (ref 3.5–5.1)

## 2011-08-30 LAB — CBC
MCH: 33.5 pg (ref 26.0–34.0)
Platelets: 195 10*3/uL (ref 150–400)
RBC: 1.79 MIL/uL — ABNORMAL LOW (ref 4.22–5.81)
WBC: 10.3 10*3/uL (ref 4.0–10.5)

## 2011-08-30 LAB — PROTIME-INR: Prothrombin Time: 21.5 seconds — ABNORMAL HIGH (ref 11.6–15.2)

## 2011-08-30 MED ORDER — HYDROMORPHONE HCL PF 1 MG/ML IJ SOLN
INTRAMUSCULAR | Status: AC
  Filled 2011-08-30: qty 2

## 2011-08-30 MED ORDER — WARFARIN SODIUM 10 MG PO TABS
10.0000 mg | ORAL_TABLET | Freq: Once | ORAL | Status: AC
  Administered 2011-08-30: 10 mg via ORAL
  Filled 2011-08-30: qty 1

## 2011-08-30 NOTE — Progress Notes (Signed)
Pt did not met goal with hemodialysis today d/t low bp. Dr Lowell Guitar notified.

## 2011-08-30 NOTE — Progress Notes (Signed)
Subjective: Patient seen and examined this am. Says he still has left hip pain off and on but better than prior days.   Objective:  Vital signs in last 24 hours:  Filed Vitals:   08/30/11 0527 08/30/11 0700 08/30/11 0940 08/30/11 1000  BP: 82/42  99/41 104/48  Pulse: 80 75 82 82  Temp: 98.6 F (37 C)  97.8 F (36.6 C)   TempSrc: Oral  Oral   Resp: 18 17 18    Height:      Weight:      SpO2: 87% 96% 96%     Intake/Output from previous day:  No intake or output data in the 24 hours ending 08/30/11 1020  Physical Exam:  Morbidly obese male lying in bed with in NAD  HEENT: no pallor, moist oral mucosa  Chest : clear breath sounds b/l, no added sounds  Cvs: s1 &s2 Regular no added sounds  Abd: obese, laparotomy scar with a superficial skin wound which is clean and dry, Soft. Non-tender, non-distended, bowel sounds are normal  Ext: dressing over surgical site, no bleeding , chr left foot drop  CNS: AAO X 3, non focal    Lab Results:  Basic Metabolic Panel:    Component Value Date/Time   NA 136 08/29/2011 0536   K 4.4 08/29/2011 0536   CL 99 08/29/2011 0536   CO2 26 08/29/2011 0536   BUN 59* 08/29/2011 0536   CREATININE 5.74* 08/29/2011 0536   GLUCOSE 87 08/29/2011 0536   CALCIUM 9.0 08/29/2011 0536   CBC:    Component Value Date/Time   WBC 11.8* 08/29/2011 0536   HGB 7.2* 08/29/2011 0536   HCT 21.8* 08/29/2011 0536   PLT 188 08/29/2011 0536   MCV 98.6 08/29/2011 0536   NEUTROABS 9.0* 08/24/2011 1030   LYMPHSABS 1.5 08/24/2011 1030   MONOABS 0.7 08/24/2011 1030   EOSABS 0.0 08/24/2011 1030   BASOSABS 0.0 08/24/2011 1030    Recent Results (from the past 240 hour(s))  MRSA PCR SCREENING     Status: Normal   Collection Time   08/24/11  7:29 PM      Component Value Range Status Comment   MRSA by PCR NEGATIVE  NEGATIVE  Final     Studies/Results: No results found.  Medications: Scheduled Meds:   . darbepoetin (ARANESP) injection - DIALYSIS  60 mcg Intravenous Q Thu-HD  . docusate  sodium  100 mg Oral BID  . escitalopram  20 mg Oral Daily  . folic acid-pyridoxine-cyancobalamin  1 tablet Oral Daily  . nicotine  21 mg Transdermal Daily  . oxyCODONE  15 mg Oral Q12H  . pantoprazole  40 mg Oral Q1200  . senna  2 tablet Oral BID  . sevelamer  1,600 mg Oral TID WC  . warfarin  12 mg Oral ONCE-1800  . Warfarin - Pharmacist Dosing Inpatient   Does not apply q1800  . DISCONTD: warfarin  15 mg Oral ONCE-1800   Continuous Infusions:  PRN Meds:.ALPRAZolam, heparin, HYDROcodone-acetaminophen, HYDROmorphone (DILAUDID) injection, naloxone, sodium chloride, DISCONTD: ondansetron (ZOFRAN) IV  Assessment/Plan:  * Left Intertrochanteric fracture of femur  ORIF with hip screw done on 3/29  INR stable , resumed coumadin  switched PCA to prn dilaudid( as pt very sleepy on it), vicodin and oxycontin.  monitor wound site  ordered PT and CIR consult . CIR has agreed upon accepting pt to inpatient rehab but are holding off now as patient has not participated with weight bearing and will be evaluated again  today. If does weight bear and participates with PT well today. He could possibly be discharged tomorrow to inpt rehab. Otherwise will have to looking for SNF.  Rest per ortho recs   ESRD (end stage renal disease) on dialysis  Gets HD tu, th , sat  Renal team following   Anemia  Hb dropped to 7.12 on 4/1. Getting 2 u PRBC with HD today. 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 DM  informs his DM to have resolved since 2 years  Fsg have been normal  A1C of 5.3 only. No  monitoring required  Full code   Consults: Turner Daniels ( orthopedics)  CIR  dispo   CIR has agreed upon accepting pt to inpatient rehab but are holding off now as patient has not participated with weight bearing and will be evaluated again today. If does weight bear and participates with PT well today. He could possibly be discharged tomorrow to inpt rehab. Otherwise will have to looking for SNF.         LOS: 6 days   Hudson Majkowski 08/30/2011, 10:20 AM

## 2011-08-30 NOTE — Progress Notes (Signed)
   CARE MANAGEMENT NOTE 08/30/2011  Patient:  KOJI, NIEHOFF   Account Number:  [id number]  Date Initiated:  08/25/2011  Documentation initiated by:  Donn Pierini  Subjective/Objective Assessment:   Pt admitted with hip fx, will need OR repair     Action/Plan:   PTA pt lived at home with spouse, hx ESRD on HD t/th/sat   Anticipated DC Date:  08/29/2011   Anticipated DC Plan:  SKILLED NURSING FACILITY  In-house referral  Clinical Social Worker      DC Planning Services  CM consult      Choice offered to / List presented to:             Status of service:  In process, will continue to follow Medicare Important Message given?   (If response is "NO", the following Medicare IM given date fields will be blank) Date Medicare IM given:   Date Additional Medicare IM given:    Discharge Disposition:    Per UR Regulation:  Reviewed for med. necessity/level of care/duration of stay  If discussed at Long Length of Stay Meetings, dates discussed:    Comments:  08/30/11 Onnie Boer, RN, BSN 1229 UR COMPLETED  08/25/11- 1400 - Donn Pierini RN, BSN [phone] Plan for OR on Friday 08/26/11 for hip repair- PT/OT after surgery. CM will follow for d/c needs/planning.

## 2011-08-30 NOTE — Progress Notes (Signed)
On Hemodialysis currently: Hemodynamically stable  Assessment/Plan:  1. Hip fracture- s/p ORIF Friday.  2. ESRD- TTS via AVF-  3. Anemia- progressive, transfusion with dialysis in progress 4. Secondary hyperparathyroidism- on renvela, not on any vitamin D as OP  5. HTN/volume- BP low prob due to ABLA, difficult to tell volume status secondary to obesity.  Continue dialytic support [name]

## 2011-08-30 NOTE — Progress Notes (Addendum)
ANTICOAGULATION CONSULT NOTE - Follow Up Consult  Pharmacy Consult for Coumadin Indication: Hx DVT  Allergies  Allergen Reactions  . Zosyn Hives    Patient Measurements: Height: 6' (182.9 cm) Weight:  (unable to weigh) IBW/kg (Calculated) : 77.6  Heparin Dosing Weight:   Vital Signs: Temp: 98.6 F (37 C) (04/02 0527) Temp src: Oral (04/02 0527) BP: 82/42 mmHg (04/02 0527) Pulse Rate: 75  (04/02 0700)  Labs:  Basename 08/29/11 0536 08/28/11 0554  HGB 7.2* 8.1*  HCT 21.8* 25.3*  PLT 188 171  APTT -- --  LABPROT 17.5* 16.2*  INR 1.41 1.27  HEPARINUNFRC -- --  CREATININE 5.74* 4.44*  CKTOTAL -- --  CKMB -- --  TROPONINI -- --   Estimated Creatinine Clearance: 25.3 ml/min (by C-G formula based on Cr of 5.74).  Assessment: Patient is a 46 y.o M on coumadin for hx DVT and s/p hip ORIF. Hgb trending down to 6.0 today. No bleeding or  issues noted with wound.  OK with Dr. Turner Daniels to increase INR range to 2-3.  Spoke to Dr. Gonzella Lex and he is also OK with this.  INR is trending up.  Jump in INR from 1.41 to 1.83 is likely reflective of the 15mg  dose given on 3/31. Will decrease dose slightly today d/t drop in hgb and to avoid noticeable INR increase in AM.   Goal of Therapy:  INR 2-3   Plan:  1) Coumadin 10mg  pox1 today   Anallely Rosell P 08/30/2011,8:31 AM

## 2011-08-30 NOTE — Progress Notes (Signed)
PT Cancellation Note  Treatment cancelled today due to patient receiving procedure or test. Pt in HD. Will attempt at a later time.   Fredrich Birks 08/30/2011, 1:17 PM 08/30/2011 Fredrich Birks PTA [phone] pager [phone] office

## 2011-08-30 NOTE — Progress Notes (Signed)
   CARE MANAGEMENT NOTE 08/30/2011  Patient:  [name], [name]   Account Number:  [id number]  Date Initiated:  08/25/2011  Documentation initiated by:  Donn Pierini  Subjective/Objective Assessment:   Pt admitted with hip fx, will need OR repair     Action/Plan:   PTA pt lived at home with spouse, hx ESRD on HD t/th/sat   Anticipated DC Date:  08/29/2011   Anticipated DC Plan:  SKILLED NURSING FACILITY  In-house referral  Clinical Social Worker      DC Planning Services  CM consult      Choice offered to / List presented to:             Status of service:  In process, will continue to follow Medicare Important Message given?   (If response is "NO", the following Medicare IM given date fields will be blank) Date Medicare IM given:   Date Additional Medicare IM given:    Discharge Disposition:    Per UR Regulation:  Reviewed for med. necessity/level of care/duration of stay  If discussed at Long Length of Stay Meetings, dates discussed:    Comments:  08/30/11- 1440- Donn Pierini RN, BSN [phone] CM following for d/c plannning. CIR consulted for possible admission- pt will need to be able to participate in therapy- otherwise may need to look into SNF.  08/30/11 Onnie Boer, RN, BSN 1229 UR COMPLETED  08/25/11- 1400 - Donn Pierini RN, BSN [phone] Plan for OR on Friday 08/26/11 for hip repair- PT/OT after surgery. CM will follow for d/c needs/planning.

## 2011-08-30 NOTE — Clinical Documentation Improvement (Signed)
Anemia Blood Loss Clarification  THIS DOCUMENT IS NOT A PERMANENT PART OF THE MEDICAL RECORD  RESPOND TO THE THIS QUERY, FOLLOW THE INSTRUCTIONS BELOW:  1. If needed, update documentation for the patient's encounter via the notes activity.  2. Access this query again and click edit on the In [employer].  3. After updating, or not, click F2 to complete all highlighted (required) fields concerning your review. Select "additional documentation in the medical record" OR "no additional documentation provided".  4. Click Sign note button.  5. The deficiency will fall out of your In Basket *Please let us know if you are not able to complete this workflow by phone or e-mail (listed below).        08/30/11  Dear Dr. Gonzella Lex Marton [name]  In an effort to better capture your patient's severity of illness, reflect appropriate length of stay and utilization of resources, a review of the patient medical record has revealed the following indicators.    Based on your clinical judgment, please clarify and document in a progress note and/or discharge summary the clinical condition associated with the following supporting information:  In responding to this query please exercise your independent judgment.  The fact that a query is asked, does not imply that any particular answer is desired or expected.  Possible Clinical Conditions?    Acute Blood Loss Anemia  Acute on chronic blood loss anemia  Chronic blood loss anemia  Precipitous drop in Hematocrit  Other Condition________________  Cannot Clinically Determine   Risk Factors: (recent orif HIP)  Supporting Information:(AS PER NOTES)"Anemia"  "Hb dropped to 7.12 on 4/1. Getting 2 u PRBC with HD today. Monitor h& h in am labs"   Reviewed: additional documentation in the medical record PER DR. Blake Divine  Thank You,  Joanette Gula Delk RN,BSN Clinical Documentation Specialist: [phone] Pager Health Information Management [facility]

## 2011-08-30 NOTE — Progress Notes (Signed)
Subjective: Patient reports his pain is 8/10, he reportedly stood with physical therapy holding onto a chair in the room yesterday 20 pounds weight bearing. He did not ambulate in the room because of pain and fatigue.  Objective: His wound is clean and dry, minimally tender. He continues to have a chronic left foot drop that he has had for 10 years, axially loading the left lower tremor the reproduces minimal pain.  Assessment: 1. Four-day status post open reduction internal fixation left hip intertrochanteric fracture with a dynamic hip screw 2. Morbid super obesity.  3. History of DVTs on chronic Coumadin.  4. Chronic Lfoot drop.  Plan: Continue attempts at physical therapy 20 pounds weight bearing to the left lower extremity. We will attempt to order a left AFO brace to control his left foot drop. At this time he may go back to his full dose of Coumadin since his wound is clean and dry. Agree with attempt for inpatient rehabilitation.     Caldwell Kronenberger J 08/30/2011, 7:41 AM

## 2011-08-30 NOTE — H&P (Signed)
Physical Medicine and Rehabilitation Admission H&P  [name] is an 46 y.o. male.   Chief Complaint  Patient presents with  . 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  Of note is that he lost approximately 400 pounds after his gastric bypass surgery. At baseline he was able to ambulate 50 feet with limited endurance and used a sliding board for transfers at home and also driving. Admitted March 27 after a fall at home sustaining a left intertrochanteric hip fracture.  His Coumadin was held until INR less than 2.0 and underwent left hip open reduction internal fixation with hip screw March 29 per Dr. [name]. Advise 20 pounds to nonweightbearing left lower extremity  for 4-6 weeks. Patient has had difficulty maintaining his weightbearing status. Chronic Coumadin has been resumed for history of DVT. Patient on contact precautions for history of VRE. Dialysis as advised per [facility]. Noted anemia 7.2 April 1 and transfuse with dialysis. Patient with chronic pain presently on OxyContin 15 mg every 12 hours as he had been on 30 mg twice daily prior to admission. Physical therapy ongoing with plan to order a left AFO brace that was done 08/30/11 for chronic foot drop with recommendations for physical medicine rehabilitation consult to consider inpatient rehabilitation services  Review of Systems  Respiratory: Positive for shortness of breath.  Gastrointestinal: Positive for constipation.  Musculoskeletal: Positive for myalgias, joint and back pain.  Psychiatric/Behavioral: Positive for depression   Past Medical History  Diagnosis Date  . Hypertension   . GERD (gastroesophageal reflux disease)   . S/P  laparoscopic cholecystectomy   . DVT (deep venous thrombosis)   . Renal disorder   . Complication of anesthesia     " I am hard  to wake up "  . Diabetes mellitus    Past Surgical History  Procedure Date  . Gastric sleeve   . Cholecystectomy, laparoscopic   . Kidney stones   . Dialysis port    History reviewed. No pertinent family history. Social History:  reports that he has been smoking Cigarettes.  He has never used smokeless tobacco. He reports that he does not drink alcohol or use illicit drugs. Allergies:  Allergies  Allergen Reactions  . Zosyn Hives   Medications Prior to Admission  Medication Dose Route Frequency Provider Last Rate Last Dose  . ALPRAZolam (XANAX) tablet 1 mg  1 mg Oral TID PRN Eddie North, MD   1 mg at 08/28/11 2201  . darbepoetin (ARANESP) injection 60 mcg  60 mcg Intravenous Q Thu-HD Sadie Haber, MD   60 mcg at 08/25/11 1454  . docusate sodium (COLACE) capsule 100 mg  100 mg Oral BID Nishant Dhungel, MD   100 mg at 08/29/11 2236  . escitalopram (LEXAPRO) tablet 20 mg  20 mg Oral Daily Nishant Dhungel, MD   20 mg at 08/29/11 0914  . folic acid-pyridoxine-cyancobalamin (FOLTX) 2.5-25-2 MG per tablet 1 tablet  1 tablet Oral Daily Nishant Dhungel, MD   1 tablet at 08/29/11 0914  . heparin injection 3,100 Units  20 Units/kg Dialysis PRN Lauris Poag, MD      . HYDROcodone-acetaminophen (NORCO) 10-325 MG per tablet 1 tablet  1 tablet Oral Q4H PRN Eddie North, MD   1  tablet at 08/30/11 0331  . HYDROmorphone (DILAUDID) injection 0.5 mg  0.5 mg Intravenous Q5 min PRN Rivka Barbara, MD   0.5 mg at 08/26/11 1551  . HYDROmorphone (DILAUDID) injection 1 mg  1 mg Intravenous Once Joya Gaskins, MD   1 mg at 08/24/11 1035  . HYDROmorphone (DILAUDID) injection 1 mg  1 mg Intravenous Once Joya Gaskins, MD   1 mg at 08/24/11 1113  . HYDROmorphone (DILAUDID) injection 2 mg  2 mg Intravenous Q3H PRN Nishant Dhungel, MD   2 mg at 08/30/11 0806  .  HYDROmorphone PCA 0.3 mg/mL (DILAUDID) 0.3 mg/mL infusion           . HYDROmorphone PCA 0.3 mg/mL (DILAUDID) 0.3 mg/mL infusion           . naloxone [facility]) injection 0.4 mg  0.4 mg Intravenous PRN Caroline More, NP       And  . sodium chloride 0.9 % injection 9 mL  9 mL Intravenous PRN Caroline More, NP      . nicotine (NICODERM CQ - dosed in mg/24 hours) patch 21 mg  21 mg Transdermal Daily Nishant Dhungel, MD   21 mg at 08/29/11 0913  . oxyCODONE (OXYCONTIN) 12 hr tablet 15 mg  15 mg Oral Q12H Nishant Dhungel, MD   15 mg at 08/29/11 0912  . pantoprazole (PROTONIX) EC tablet 40 mg  40 mg Oral Q1200 Nishant Dhungel, MD   40 mg at 08/29/11 1211  . phytonadione (VITAMIN K) tablet 5 mg  5 mg Oral Once Joya Gaskins, MD   5 mg at 08/24/11 1351  . phytonadione (VITAMIN K) tablet 5 mg  5 mg Oral Once Nishant Dhungel, MD   5 mg at 08/25/11 2023  . senna (SENOKOT) tablet 17.2 mg  2 tablet Oral BID Nishant Dhungel, MD   17.2 mg at 08/29/11 2236  . sevelamer (RENVELA) tablet 1,600 mg  1,600 mg Oral TID WC Nishant Dhungel, MD   1,600 mg at 08/29/11 1727  . warfarin (COUMADIN) tablet 12 mg  12 mg Oral Once Dannielle Karvonen [place], PHARMD   12 mg at 08/26/11 2053  . warfarin (COUMADIN) tablet 12 mg  12 mg Oral ONCE-1800 Nishant Dhungel, MD   12 mg at 08/27/11 1744  . warfarin (COUMADIN) tablet 12 mg  12 mg Oral ONCE-1800 Anh P Pham, PHARMD   12 mg at 08/29/11 1727  . warfarin (COUMADIN) tablet 15 mg  15 mg Oral ONCE-1800 Nishant Dhungel, MD   15 mg at 08/28/11 1721  . Warfarin - Pharmacist Dosing Inpatient   Does not apply q1800 Dannielle Karvonen [place], PHARMD      . DISCONTD: 0.9 % irrigation (POUR BTL)    PRN Velna Ochs, MD   1,000 mL at 08/26/11 1208  . DISCONTD: calcitRIOL (CALCIJEX) 1 MCG/ML injection 1 mcg  1 mcg Intravenous Q T,Th,Sa-HD Cecille Aver, MD      . DISCONTD: darbepoetin (ARANESP) injection 60 mcg  60 mcg Intravenous Q Thu-HD Cecille Aver, MD      . DISCONTD: Folic  Acid-Vit B6-Vit B12 (FOLBEE) tablet 1 tablet  1 tablet Oral Daily Nishant Dhungel, MD   1 tablet at 08/26/11 1723  . DISCONTD: HYDROcodone-acetaminophen (NORCO) 5-325 MG per tablet 1-2 tablet  1-2 tablet Oral Q4H PRN Nishant Dhungel, MD      . DISCONTD: HYDROmorphone (DILAUDID) injection 0.25-0.5 mg  0.25-0.5 mg Intravenous Q5 min PRN Raiford Simmonds, MD  0.5 mg at 08/26/11 1449  . DISCONTD: HYDROmorphone (DILAUDID) injection 0.5 mg  0.5 mg Intravenous Q10 min PRN Rivka Barbara, MD      . DISCONTD: HYDROmorphone (DILAUDID) injection 1 mg  1 mg Intravenous Q1H PRN Joya Gaskins, MD   1 mg at 08/24/11 1943  . DISCONTD: HYDROmorphone (DILAUDID) injection 2 mg  2 mg Intravenous Q3H PRN Nishant Dhungel, MD   2 mg at 08/27/11 2341  . DISCONTD: HYDROmorphone (DILAUDID) PCA injection 0.3 mg/mL   Intravenous Q4H Caroline More, NP   3 mg at 08/27/11 0406  . DISCONTD: HYDROmorphone PCA 0.3 mg/mL (DILAUDID) 0.3 mg/mL infusion           . DISCONTD: morphine 2 MG/ML injection 2 mg  2 mg Intravenous Q4H PRN Nishant Dhungel, MD      . DISCONTD: ondansetron (ZOFRAN) injection 4 mg  4 mg Intravenous Q6H PRN Raiford Simmonds, MD      . DISCONTD: senna (SENOKOT) tablet 8.6 mg  1 tablet Oral BID Nishant Dhungel, MD   8.6 mg at 08/28/11 0939  . DISCONTD: warfarin (COUMADIN) tablet 15 mg  15 mg Oral ONCE-1800 Anh P Pham, PHARMD       No current outpatient prescriptions on file as of 08/30/2011.    Home: Home Living Lives With: Spouse;Family Receives Help From: Family Type of Home: House Home Layout: Two level;Able to live on main level with bedroom/bathroom Home Access: Ramped entrance Home Adaptive Equipment: Wheelchair - manual;Walker - rolling;Bedside commode/3-in-1   Functional History: Prior Function Level of Independence: Independent with transfers (non ambulatory) Driving: Yes Vocation: On disability  Functional Status:  Mobility: Bed Mobility Bed Mobility: Yes Supine to Sit: 2: Max  assist;Patient percentage (comment) (Pt 40%) Supine to Sit Details (indicate cue type and reason): Manual facilitation to manage Lt LE.  Verbal cues for technique including sequencing and use of Rt bedrail.  Sit to Supine: 3: Mod assist;HOB flat (Pt 50%) Sit to Supine - Details (indicate cue type and reason): Assist to manage L LE, Verbal cues for technique.   Transfers Transfers: No Ambulation/Gait Ambulation/Gait: No Stairs: No Wheelchair Mobility Wheelchair Mobility: No  ADL:    Cognition: Cognition Arousal/Alertness: Awake/alert Orientation Level: Oriented X4 Cognition Arousal/Alertness: Awake/alert Overall Cognitive Status: Appears within functional limits for tasks assessed Orientation Level: Oriented X4   Blood pressure 82/42, pulse 75, temperature 98.6 F (37 C), temperature source Oral, resp. rate 17, height 6' (1.829 m), weight 153.2 kg (337 lb 11.9 oz), SpO2 96.00%. Physical Exam  Constitutional: He is oriented to person, place, and time.  46 year old right-handed morbidly obese male  HENT:  Head: Normocephalic. Pupils equal round reactive to light, extraocular eye movements are intact. Neck: Normal range of motion. Neck supple. No thyromegaly present.  Cardiovascular: Regular rhythm. Regular rate. No murmurs rubs or gallops. Pulmonary/Chest: Breath sounds normal. He has no wheezes, rales, or rhonchi  Abdominal: He exhibits no distension. There is no tenderness. Bowel sounds are positive  Neurological: He is alert and oriented to person, place, and time.  Skin:  Hip incision clean and dry with staples intact. Area is tender to touch. He has a Mepilex dressing over the wound.  Psychiatric: He has a normal mood and affect.  upper extremities have 5/5 strength in the deltoid, biceps, triceps, grip  Right lower extremity has 3 minus over 5 strength in hip flexor, 4 minus at the knee extensors 4/5 ankle dorsiflexion and plantar flexion Left lower extremity has 1/5  in  the hip flexor knee extensor and 0/5 and ankle dorsiflexor and trace/5 in ankle plantar flexor  Sensation is reduced over the right great toe with both light touch and proprioception. The left great toe and left dorsum of the foot have absent sensation to light touch in proprioception. He also has diminished sensation over the dorsum of the foot the sole the foot and the back of the calf. Fine motor skills are normal in the upper extremities. Cognitively he is intact. Musculoskeletal: Patient with significant low back pain. He had tenderness with minimal manipulation of the back as well as with passive or active flexion of each hip.   Results for orders placed during the hospital encounter of 08/24/11 (from the past 48 hour(s))  BASIC METABOLIC PANEL     Status: Abnormal   Collection Time   08/29/11  5:36 AM      Component Value Range Comment   Sodium 136  135 - 145 (mEq/L)    Potassium 4.4  3.5 - 5.1 (mEq/L)    Chloride 99  96 - 112 (mEq/L)    CO2 26  19 - 32 (mEq/L)    Glucose, Bld 87  70 - 99 (mg/dL)    BUN 59 (*) 6 - 23 (mg/dL)    Creatinine, Ser 1.61 (*) 0.50 - 1.35 (mg/dL)    Calcium 9.0  8.4 - 10.5 (mg/dL)    GFR calc non Af Amer 11 (*) >90 (mL/min)    GFR calc Af Amer 13 (*) >90 (mL/min)   PROTIME-INR     Status: Abnormal   Collection Time   08/29/11  5:36 AM      Component Value Range Comment   Prothrombin Time 17.5 (*) 11.6 - 15.2 (seconds)    INR 1.41  0.00 - 1.49    CBC     Status: Abnormal   Collection Time   08/29/11  5:36 AM      Component Value Range Comment   WBC 11.8 (*) 4.0 - 10.5 (K/uL)    RBC 2.21 (*) 4.22 - 5.81 (MIL/uL)    Hemoglobin 7.2 (*) 13.0 - 17.0 (g/dL)    HCT 09.6 (*) 04.5 - 52.0 (%)    MCV 98.6  78.0 - 100.0 (fL)    MCH 32.6  26.0 - 34.0 (pg)    MCHC 33.0  30.0 - 36.0 (g/dL)    RDW 40.9 (*) 81.1 - 15.5 (%)    Platelets 188  150 - 400 (K/uL)   PREPARE RBC (CROSSMATCH)     Status: Normal   Collection Time   08/29/11  4:31 PM      Component Value Range  Comment   Order Confirmation ORDER PROCESSED BY BLOOD BANK     TYPE AND SCREEN     Status: Normal (Preliminary result)   Collection Time   08/29/11  4:31 PM      Component Value Range Comment   ABO/RH(D) O NEG      Antibody Screen NEG      Sample Expiration 09/01/2011      Unit Number [id number]      Blood Component Type RBC CPDA1, LR      Unit division 00      Status of Unit ALLOCATED      Transfusion Status OK TO TRANSFUSE      Crossmatch Result Compatible      Unit Number [id number]      Blood Component Type RBC CPDA1, LR  Unit division 00      Status of Unit ALLOCATED      Transfusion Status OK TO TRANSFUSE      Crossmatch Result Compatible     PREPARE RBC (CROSSMATCH)     Status: Normal   Collection Time   08/30/11  7:49 AM      Component Value Range Comment   Order Confirmation ORDER PROCESSED BY BLOOD BANK      No results found.  Post Admission Physician Evaluation: 1. Functional deficits secondary  to left intertrochanteric hip fracture and morbid obesity. 2. Patient is admitted to receive collaborative, interdisciplinary care between the physiatrist, rehab nursing staff, and therapy team. 3. Patient's level of medical complexity and substantial therapy needs in context of that medical necessity cannot be provided at a lesser intensity of care such as a SNF. 4. Patient has experienced substantial functional loss from his/her baseline which was documented above under the "Functional History" and "Functional Status" headings.  Judging by the patient's diagnosis, physical exam, and functional history, the patient has potential for functional progress which will result in measurable gains while on inpatient rehab.  These gains will be of substantial and practical use upon discharge  in facilitating mobility and self-care at the household level. 5. Physiatrist will provide 24 hour management of medical needs as well as oversight of the therapy plan/treatment and provide guidance as  appropriate regarding the interaction of the two. 6. 24 hour rehab nursing will assist with bladder management, bowel management, safety, skin/wound care, disease management, medication administration, pain management and patient education  and help integrate therapy concepts, techniques,education, etc. 7. PT will assess and treat for:  Functional mobility, transfers, wheelchair manipulation, pain management.  Goals are: Supervision to minimal assistance. Patient will have ambulation goals given his fracture in size. 8. OT will assess and treat for: Upper extremity strength, functional mobility, safety, ADLs and pain control.   Goals are: Supervision to moderate assistance. 9. SLP will assess and treat for: Not applicable 10. Case Management and Social Worker will assess and treat for psychological issues and discharge planning. 11. Team conference will be held weekly to assess progress toward goals and to determine barriers to discharge. 12.  Patient will receive at least 3 hours of therapy per day at least 5 days per week. 13. ELOS and Prognosis: 10-14 days good   Medical Problem List and Plan: 1. left intertrochanteric hip fracture after a fall. Status post ORIF with hip screw 2. DVT Prophylaxis/Anticoagulation: Chronic Coumadin for history of DVT. Monitor for any signs of bleeding 3. Pain Management: Increase OxyContin to 30 mg every 12 hours. Continue Norco as needed. Aggressively treat with muscle relaxants as well. 4. End-stage renal disease. Hemodialysis as per [facility] 5. Morbid obesity. Status post gastric sleeve procedure 2 years ago. Presently at 337 pounds 6. Depression. Lexapro and Xanax. Provide emotional support and positive reinforcement 7. Anemia. Patient has been transfused. Continue Aranesp. Followup CBC with dialysis 8. Tobacco abuse. NicoDerm patch 9. History of VRE. Contact precautions 10. Chronic left foot drop. Plan AFO that was ordered 08/30/11. We'll  discuss with therapy team. He will need a heavy-duty brace to accommodate his weight. The level and a use for this initially. Ranelle Oyster M.D. 08/30/2011,

## 2011-08-30 NOTE — Progress Notes (Signed)
CIR following patient. Discussed with patient that his tolerance may be an issue and that he would have to show progress with therapies before coming to CIR. Patient states, "I just don't know if I can do it". Discussed other options and patient not very receptive to SNF for rehab. Made aware that I will f/u in am and see how he does with therapies. Toni Amend adm coordinator [phone].

## 2011-08-31 LAB — POCT I-STAT 4, (NA,K, GLUC, HGB,HCT)
Glucose, Bld: 114 mg/dL — ABNORMAL HIGH (ref 70–99)
Potassium: 4.7 mEq/L (ref 3.5–5.1)
Sodium: 137 mEq/L (ref 135–145)

## 2011-08-31 LAB — CBC
MCHC: 33.1 g/dL (ref 30.0–36.0)
MCV: 97.2 fL (ref 78.0–100.0)
Platelets: 168 10*3/uL (ref 150–400)
RDW: 18.1 % — ABNORMAL HIGH (ref 11.5–15.5)
WBC: 7.8 10*3/uL (ref 4.0–10.5)

## 2011-08-31 LAB — TYPE AND SCREEN: Unit division: 0

## 2011-08-31 LAB — PROTIME-INR: INR: 1.86 — ABNORMAL HIGH (ref 0.00–1.49)

## 2011-08-31 MED ORDER — HEPARIN SODIUM (PORCINE) 1000 UNIT/ML DIALYSIS
20.0000 [IU]/kg | INTRAMUSCULAR | Status: DC | PRN
  Administered 2011-09-01: 3100 [IU] via INTRAVENOUS_CENTRAL
  Filled 2011-08-31: qty 4

## 2011-08-31 MED ORDER — METHOCARBAMOL 500 MG PO TABS
500.0000 mg | ORAL_TABLET | Freq: Three times a day (TID) | ORAL | Status: DC | PRN
  Administered 2011-08-31: 500 mg via ORAL
  Filled 2011-08-31 (×3): qty 1

## 2011-08-31 MED ORDER — WARFARIN SODIUM 6 MG PO TABS
12.0000 mg | ORAL_TABLET | Freq: Once | ORAL | Status: AC
  Administered 2011-08-31: 12 mg via ORAL
  Filled 2011-08-31 (×2): qty 2

## 2011-08-31 MED ORDER — ZOLPIDEM TARTRATE 5 MG PO TABS
5.0000 mg | ORAL_TABLET | Freq: Every evening | ORAL | Status: DC | PRN
  Administered 2011-08-31: 5 mg via ORAL
  Filled 2011-08-31: qty 1

## 2011-08-31 MED ORDER — HYDROCODONE-ACETAMINOPHEN 10-325 MG PO TABS
2.0000 | ORAL_TABLET | ORAL | Status: DC | PRN
  Administered 2011-08-31 – 2011-09-01 (×5): 2 via ORAL
  Filled 2011-08-31 (×5): qty 2

## 2011-08-31 NOTE — Progress Notes (Signed)
ANTICOAGULATION CONSULT NOTE - Follow Up Consult  Pharmacy Consult for Coumadin Indication: Hx DVT  Allergies  Allergen Reactions  . Zosyn Hives    Patient Measurements: Height: 6' (182.9 cm) Weight:  (calibration on bed not working.) IBW/kg (Calculated) : 77.6  Heparin Dosing Weight:   Vital Signs: Temp: 98.3 F (36.8 C) (04/03 0510) Temp src: Oral (04/03 0510) BP: 104/48 mmHg (04/03 0510) Pulse Rate: 77  (04/03 0510)  Labs:  Basename 08/31/11 0605 08/30/11 1011 08/29/11 0536  HGB -- 6.0* 7.2*  HCT -- 17.6* 21.8*  PLT -- 195 188  APTT -- -- --  LABPROT 21.8* 21.5* 17.5*  INR 1.86* 1.83* 1.41  HEPARINUNFRC -- -- --  CREATININE -- 6.83* 5.74*  CKTOTAL -- -- --  CKMB -- -- --  TROPONINI -- -- --   Estimated Creatinine Clearance: 21.3 ml/min (by C-G formula based on Cr of 6.83).   Assessment: Patient is a 46 y.o M on coumadin for hx DVT.  S/p 2 units of blood transfusion on 4/02.  No new cbc today.  INR remains at ~1.8 after dose decreased to 10mg  yesterday.  Goal of Therapy:  INR 2-3   Plan:  1) Increase coumadin to 12mg  PO x1 today  Romen Yutzy P 08/31/2011,8:37 AM

## 2011-08-31 NOTE — Progress Notes (Signed)
Rehab admissions - I spoke with patient about inpatient rehab today.  He wants to talk to his wife and sister about inpatient rehab versus HH therapies.  He will let me know in the morning what he wants to do.  He does not want SNF placement.  I will follow up in am for plans.  Pager [phone]

## 2011-08-31 NOTE — Progress Notes (Signed)
Orthopedic Tech Progress Note Patient Details:  [name] Jan 30, 1966 [id number]     [name] 08/31/2011, 11:59 AM Called bio tech

## 2011-08-31 NOTE — Progress Notes (Signed)
Subjective:  pt is yet to participate in PT, pain is better controlled with IV pain medications. Requesting sleep medication at night  Objective: Weight change:   Intake/Output Summary (Last 24 hours) at 08/31/11 0853 Last data filed at 08/30/11 1700  Gross per 24 hour  Intake   1220 ml  Output   1530 ml  Net   -310 ml    Filed Vitals:   08/31/11 0510  BP: 104/48  Pulse: 77  Temp: 98.3 F (36.8 C)  Resp: 18   Morbidly obese male lying in bed with in NAD  HEENT: no pallor, moist oral mucosa  Chest : clear breath sounds b/l, no added sounds  Cvs: s1 &s2 Regular no added sounds  Abd: obese, laparotomy scar with a superficial skin wound which is clean and dry, Soft. Non-tender, non-distended, bowel sounds are normal  Ext: dressing over surgical site, no bleeding , chr left foot drop  CNS: AAO X 3, non focal   Lab Results: Results for orders placed during the hospital encounter of 08/24/11 (from the past 24 hour(s))  BASIC METABOLIC PANEL     Status: Abnormal   Collection Time   08/30/11 10:11 AM      Component Value Range   Sodium 134 (*) 135 - 145 (mEq/L)   Potassium 4.5  3.5 - 5.1 (mEq/L)   Chloride 97  96 - 112 (mEq/L)   CO2 22  19 - 32 (mEq/L)   Glucose, Bld 143 (*) 70 - 99 (mg/dL)   BUN 77 (*) 6 - 23 (mg/dL)   Creatinine, Ser 1.61 (*) 0.50 - 1.35 (mg/dL)   Calcium 8.9  8.4 - 09.6 (mg/dL)   GFR calc non Af Amer 9 (*) >90 (mL/min)   GFR calc Af Amer 10 (*) >90 (mL/min)  PROTIME-INR     Status: Abnormal   Collection Time   08/30/11 10:11 AM      Component Value Range   Prothrombin Time 21.5 (*) 11.6 - 15.2 (seconds)   INR 1.83 (*) 0.00 - 1.49   CBC     Status: Abnormal   Collection Time   08/30/11 10:11 AM      Component Value Range   WBC 10.3  4.0 - 10.5 (K/uL)   RBC 1.79 (*) 4.22 - 5.81 (MIL/uL)   Hemoglobin 6.0 (*) 13.0 - 17.0 (g/dL)   HCT 04.5 (*) 40.9 - 52.0 (%)   MCV 98.3  78.0 - 100.0 (fL)   MCH 33.5  26.0 - 34.0 (pg)   MCHC 34.1  30.0 - 36.0 (g/dL)   RDW 81.1 (*) 91.4 - 15.5 (%)   Platelets 195  150 - 400 (K/uL)  PROTIME-INR     Status: Abnormal   Collection Time   08/31/11  6:05 AM      Component Value Range   Prothrombin Time 21.8 (*) 11.6 - 15.2 (seconds)   INR 1.86 (*) 0.00 - 1.49     Micro Results: Recent Results (from the past 240 hour(s))  MRSA PCR SCREENING     Status: Normal   Collection Time   08/24/11  7:29 PM      Component Value Range Status Comment   MRSA by PCR NEGATIVE  NEGATIVE  Final     Studies/Results: Dg Hip Complete Left  08/24/2011  *RADIOLOGY REPORT*  Clinical Data: Left hip pain post fall  LEFT HIP - COMPLETE 2+ VIEW  Comparison: None  Findings: Osseous demineralization. Nondisplaced intertrochanteric fracture left femur. No femoral head dislocation.  Bony pelvis appears grossly intact. Scattered pelvic phleboliths. No other regional bony findings identified.  IMPRESSION: Nondisplaced intertrochanteric fracture left femur.  Original Report Authenticated By: Lollie Marrow, M.D.   Dg Hip Operative Left  08/26/2011  *RADIOLOGY REPORT*  Clinical Data: The left hip ORIF  OPERATIVE LEFT HIP  Comparison: 08/24/2011  Findings: Three intraoperative spot fluoro films are submitted after the procedure for review.  These show placement of a dynamic hip screw.  Frontal projection shows no evidence for hardware complications.  The cross-table lateral film is limited in that the bony cortex of the femoral head is not clearly demonstrated.  IMPRESSION: Intraoperative evaluation during ORIF for intertrochanteric hip fracture.  Original Report Authenticated By: ERIC A. MANSELL, M.D.   Dg Chest Port 1 View  08/24/2011  *RADIOLOGY REPORT*  Clinical Data: Fall, preop.  PORTABLE CHEST - 1 VIEW  Comparison: 11/05/2010.  Findings: Trachea is midline.  Heart size stable.  There is interstitial prominence and indistinctness bilaterally. Linear densities are seen in the left lower lobe.  Left costophrenic angle was not included on the film.   IMPRESSION: Pulmonary edema.  Left lower lobe subsegmental atelectasis.  Original Report Authenticated By: Reyes Ivan, M.D.   Medications: Scheduled Meds:   . darbepoetin (ARANESP) injection - DIALYSIS  60 mcg Intravenous Q Thu-HD  . docusate sodium  100 mg Oral BID  . escitalopram  20 mg Oral Daily  . folic acid-pyridoxine-cyancobalamin  1 tablet Oral Daily  . HYDROmorphone      . nicotine  21 mg Transdermal Daily  . oxyCODONE  15 mg Oral Q12H  . pantoprazole  40 mg Oral Q1200  . senna  2 tablet Oral BID  . sevelamer  1,600 mg Oral TID WC  . warfarin  10 mg Oral ONCE-1800  . warfarin  12 mg Oral ONCE-1800  . Warfarin - Pharmacist Dosing Inpatient   Does not apply q1800   Continuous Infusions:  PRN Meds:.ALPRAZolam, heparin, HYDROcodone-acetaminophen, HYDROmorphone (DILAUDID) injection, naloxone, sodium chloride  Assessment/Plan: Patient Active Hospital Problem List: Left Intertrochanteric fracture of femur  ORIF with hip screw done on 3/29  INR improving , resumed coumadin  Pain cntrol with dilaudid, vicodin and oxycontin.  monitor wound site  ordered PT and CIR consult . CIR has agreed upon accepting pt to inpatient rehab but are holding off now as patient has not participated with weight bearing and will be evaluated again today. If does weight bear and participates with PT well today. He could possibly be discharged tomorrow to inpt rehab. Otherwise will have to looking for SNF.  Rest per ortho recs  ESRD (end stage renal disease) on dialysis  Gets HD tu, th , sat  Renal team following  Anemia  Hb dropped to6 yesterday,  Most likely from post operative blood loss anemia. Transfused 2 units PRBC yesterday. Repeat cbc ordered and results pending.  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 DM  informs his DM to have resolved since 2 years  Fsg have been normal  A1C of 5.3 only. No monitoring required   Full code   Dispo: awaiting for PT , to see  if pt can participate , if he can will be discharged to inpt rehab. IF patient cannot participate then we will start the placement in SNF rehab.    LOS: 7 days   Keaira Whitehurst 08/31/2011, 8:53 AM

## 2011-08-31 NOTE — Progress Notes (Signed)
Physical Therapy Treatment Patient Details Name: [name] MRN: [id number] DOB: 1966-01-08 Today's Date: 08/31/2011  PT Assessment/Plan  PT - Assessment/Plan Comments on Treatment Session: Pt able to stand x3 today.  Pt willing to fully participate in PT but continues to be limited by pain in left hip and left foot drop.  Pt has order for LAFO to address foot drop.  Pt AROM improving in Left LE, Hip flexion and Adduction coninue to be limited by pain. Pt expressed concern about lack of relief from pain meds stating the his pain is consistenly at least 8/10.  Relayed pt's concerns to RN who planned to call MD about pt's complaint. Pt informed PT that he was transferring with a slide board for several weeks prior to his recent fall.  Will incorperate slide board transfers into treatment plan.  PT Plan: Discharge plan remains appropriate;Frequency remains appropriate PT Frequency: Min 4X/week Recommendations for Other Services: OT consult Follow Up Recommendations: Inpatient Rehab Equipment Recommended: None recommended by PT PT Goals  Acute Rehab PT Goals PT Goal Formulation: With patient Time For Goal Achievement: 2 weeks Pt will go Supine/Side to Sit: with supervision;with HOB 0 degrees;with rail PT Goal: Supine/Side to Sit - Progress: Progressing toward goal Pt will go Sit to Supine/Side: with supervision;with HOB 0 degrees;with rail PT Goal: Sit to Supine/Side - Progress: Progressing toward goal Pt will go Sit to Stand: with supervision;with upper extremity assist PT Goal: Sit to Stand - Progress: Progressing toward goal Pt will go Stand to Sit: with supervision;with upper extremity assist PT Goal: Stand to Sit - Progress: Progressing toward goal Pt will Transfer Bed to Chair/Chair to Bed: with supervision;Other (comment) (Stand pivot or slide board transfer. ) PT Transfer Goal: Bed to Chair/Chair to Bed - Progress: Not progressing Pt will Stand: with supervision;3 - 5 min;with  bilateral upper extremity support;Other (comment) (NWB on Left LE  ) PT Goal: Stand - Progress: Goal set today Pt will Perform Home Exercise Program: Independently PT Goal: Perform Home Exercise Program - Progress: Progressing toward goal  PT Treatment Precautions/Restrictions  Precautions Precautions: Fall Required Braces or Orthoses: Yes Other Brace/Splint: AFO for Left foot drop.  (ordered 08/30/2011) Restrictions Weight Bearing Restrictions: Yes LLE Weight Bearing: Non weight bearing Mobility (including Balance) Bed Mobility Bed Mobility: Yes Supine to Sit: Patient percentage (comment);3: Mod assist Supine to Sit Details (indicate cue type and reason): Pt required less assistance to initiate Left hip Adduction (painfull).  Pt pulling on my hand to flex trunk do to pain in hip.     Sit to Supine: 3: Mod assist;HOB flat Sit to Supine - Details (indicate cue type and reason): Assist for Left LE management due to pain.  Cues lean to the right elbow vs falling back into supine.  Pt tolerated much better than last session.   Transfers Transfers: Yes Sit to Stand: 1: +2 Total assist;From elevated surface;From bed;Patient percentage (comment) (Pt 50% repeated 3 trials.  ) Sit to Stand Details (indicate cue type and reason): Pt having difficulty NWB on Left LE.  Second person assisting to position Left LE forward to reduce amount of weight pt able to put on Left LE.  Pt pushing on RW with bilateral UEs.  Manual facilitation to shift pt's wt to the right to avoid wb on left LE.  Tactile cues for anterior wt shift and manual facilitaiton to stabilize pt and assitst with management of pt's body wt until pt could achieve fulll Right knee and trunk extension.  Despite  repeated cueing pt unable to maintain NWB on Lt LE.   Stand to Sit: 4: Min assist;With upper extremity assist;To bed (3 trials) Stand to Sit Details: Second person to lower bed while pt standing. Pt required assistance to contol descent  to bed mostly via verbal and tactile cues.   (Repeated 3 times) Stand Pivot Transfers: 1: +2 Total assist Stand Pivot Transfer Details (indicate cue type and reason): Attempted but pt putting too much wt through left LE.  Unable to pivot on Rt LE and maintain NWB in Left LE.  Lateral/Scoot Transfers: Other (comment) (Will attempt next session.  ) Ambulation/Gait Ambulation/Gait: No Ambulation/Gait Assistance: 1: +2 Total assist Ambulation/Gait Assistance Details (indicate cue type and reason): Attempted but pt unable to advance Rt LE while NWB on Right LE.   Assistive device: Rolling walker Stairs: No Wheelchair Mobility Wheelchair Mobility: No  Posture/Postural Control Posture/Postural Control: No significant limitations Balance Balance Assessed: Yes Static Sitting Balance Static Sitting - Balance Support: Feet supported;Bilateral upper extremity supported;No upper extremity supported Static Sitting - Level of Assistance: 6: Modified independent (Device/Increase time) Static Sitting - Comment/# of Minutes: 10+ minutes on EOB with/without UE assistance.  Static Standing Balance Static Standing - Balance Support: Bilateral upper extremity supported Static Standing - Level of Assistance: 5: Stand by assistance (3 trials ) Static Standing - Comment/# of Minutes: 30 seconds to 1 min standing with verbal and tactile cues for trunk extension and repeated cues for NWB on Lt LE.  Pt unable to achieve NWB on L LE on any trial.   Exercise  Total Joint Exercises Quad Sets: 5 reps;Supine;Left Gluteal Sets: 5 reps;Both;Supine Short Arc Quad: 5 reps;Left;Strengthening;Supine Heel Slides: AAROM;Left;10 reps;Supine End of Session PT - End of Session Equipment Utilized During Treatment: Gait belt Activity Tolerance: Patient limited by pain Patient left: in bed Nurse Communication: Mobility status for transfers General Behavior During Session: [facility] for tasks performed Cognition: [facility] for tasks  performed  Finnegan Gatta 08/31/2011, 11:41 AM Theron Arista L. Lavern Maslow DPT [phone]

## 2011-08-31 NOTE — Progress Notes (Signed)
  [facility] Progress Note   Assessment/ Plan:  Pt is a 46 y.o. yo male ESRD who was admitted on 08/24/2011 with Hip fracture  Assessment/Plan:  1. Hip fracture- s/p ORIF last Friday.  2. ESRD- TTS via AVF- Next treatment planned for Thursday 3. Anemia- s/p RBCs 4. Secondary hyperparathyroidism- on renvela, not on any vitamin D as OP  5. HTN/volume- BP low after wgt down with HD   Subjective:   No complaints   Objective:   BP 95/57  Pulse 74  Temp(Src) 97.9 F (36.6 C) (Oral)  Resp 18  Ht 6' (1.829 m)  Wt 153.2 kg (337 lb 11.9 oz)  BMI 45.81 kg/m2  SpO2 95%  Intake/Output Summary (Last 24 hours) at 08/31/11 1343 Last data filed at 08/31/11 0900  Gross per 24 hour  Intake    720 ml  Output      0 ml  Net    720 ml   Weight change:   Physical Exam:  Imaging: No results found.  Labs: BMET  Lab 08/30/11 1011 08/29/11 0536 08/28/11 0554 08/27/11 0810 08/26/11 0530 08/25/11 0540  NA 134* 136 139 138 137 139  K 4.5 4.4 3.8 4.3 4.0 4.1  CL 97 99 101 100 98 99  CO2 22 26 30 25 29 29   GLUCOSE 143* 87 85 100* 97 79  BUN 77* 59* 41* 55* 35* 51*  CREATININE 6.83* 5.74* 4.44* 5.47* 4.06* 5.42*  ALB -- -- -- -- -- --  CALCIUM 8.9 9.0 9.0 8.7 9.2 9.1  PHOS -- -- -- -- -- --   CBC  Lab 08/31/11 0916 08/30/11 1011 08/29/11 0536 08/28/11 0554  WBC 7.8 10.3 11.8* 10.9*  NEUTROABS -- -- -- --  HGB 8.1* 6.0* 7.2* 8.1*  HCT 24.5* 17.6* 21.8* 25.3*  MCV 97.2 98.3 98.6 100.4*  PLT 168 195 188 171    Medications:      . darbepoetin (ARANESP) injection - DIALYSIS  60 mcg Intravenous Q Thu-HD  . docusate sodium  100 mg Oral BID  . escitalopram  20 mg Oral Daily  . folic acid-pyridoxine-cyancobalamin  1 tablet Oral Daily  . HYDROmorphone      . nicotine  21 mg Transdermal Daily  . oxyCODONE  15 mg Oral Q12H  . pantoprazole  40 mg Oral Q1200  . senna  2 tablet Oral BID  . sevelamer  1,600 mg Oral TID WC  . warfarin  10 mg Oral ONCE-1800  . warfarin  12  mg Oral ONCE-1800  . Warfarin - Pharmacist Dosing Inpatient   Does not apply [phone]

## 2011-08-31 NOTE — Progress Notes (Signed)
Please no more than 20 lbs of weight on left leg at this time.

## 2011-08-31 NOTE — Progress Notes (Signed)
PT NOTE:   08/31/2011 [name] is having difficulty NWB on Left LE.  Pt may have more success with transfers if able to [facility] on Left LE. Please consider advancing pt's weight bearing status if you determine it will not place integrity of Left hip at risk    Thanks for your consideration, Hubert Raatz L. Stryker Veasey DPT [phone]

## 2011-08-31 NOTE — Progress Notes (Signed)
PT NOTE:  08/31/2011  Thanks for addressing the pt's weight bearing status.  Noted no more than 20 pounds on Left LE.   Thanks again, [employer]. Deshaun Schou DPT [phone]

## 2011-09-01 ENCOUNTER — Inpatient Hospital Stay (HOSPITAL_COMMUNITY): Payer: BC Managed Care – PPO

## 2011-09-01 ENCOUNTER — Encounter (HOSPITAL_COMMUNITY): Payer: Self-pay

## 2011-09-01 ENCOUNTER — Inpatient Hospital Stay (HOSPITAL_COMMUNITY)
Admission: AD | Admit: 2011-09-01 | Discharge: 2011-09-10 | DRG: 462 | Disposition: A | Discharge status: Home Health | Payer: BC Managed Care – PPO | Source: Ambulatory Visit | Attending: Physical Medicine & Rehabilitation | Admitting: Physical Medicine & Rehabilitation

## 2011-09-01 DIAGNOSIS — E119 Type 2 diabetes mellitus without complications: Secondary | ICD-10-CM

## 2011-09-01 DIAGNOSIS — Z5189 Encounter for other specified aftercare: Principal | ICD-10-CM

## 2011-09-01 DIAGNOSIS — S72143A Displaced intertrochanteric fracture of unspecified femur, initial encounter for closed fracture: Secondary | ICD-10-CM

## 2011-09-01 DIAGNOSIS — Y92009 Unspecified place in unspecified non-institutional (private) residence as the place of occurrence of the external cause: Secondary | ICD-10-CM

## 2011-09-01 DIAGNOSIS — Z86718 Personal history of other venous thrombosis and embolism: Secondary | ICD-10-CM

## 2011-09-01 DIAGNOSIS — D649 Anemia, unspecified: Secondary | ICD-10-CM

## 2011-09-01 DIAGNOSIS — M47817 Spondylosis without myelopathy or radiculopathy, lumbosacral region: Secondary | ICD-10-CM

## 2011-09-01 DIAGNOSIS — Z6841 Body Mass Index (BMI) 40.0 and over, adult: Secondary | ICD-10-CM

## 2011-09-01 DIAGNOSIS — N186 End stage renal disease: Secondary | ICD-10-CM

## 2011-09-01 DIAGNOSIS — E669 Obesity, unspecified: Secondary | ICD-10-CM

## 2011-09-01 DIAGNOSIS — I120 Hypertensive chronic kidney disease with stage 5 chronic kidney disease or end stage renal disease: Secondary | ICD-10-CM

## 2011-09-01 DIAGNOSIS — F329 Major depressive disorder, single episode, unspecified: Secondary | ICD-10-CM

## 2011-09-01 DIAGNOSIS — F17200 Nicotine dependence, unspecified, uncomplicated: Secondary | ICD-10-CM

## 2011-09-01 DIAGNOSIS — M216X9 Other acquired deformities of unspecified foot: Secondary | ICD-10-CM

## 2011-09-01 DIAGNOSIS — Z992 Dependence on renal dialysis: Secondary | ICD-10-CM

## 2011-09-01 DIAGNOSIS — Z9884 Bariatric surgery status: Secondary | ICD-10-CM

## 2011-09-01 DIAGNOSIS — W19XXXA Unspecified fall, initial encounter: Secondary | ICD-10-CM

## 2011-09-01 DIAGNOSIS — N189 Chronic kidney disease, unspecified: Secondary | ICD-10-CM

## 2011-09-01 DIAGNOSIS — Z9889 Other specified postprocedural states: Secondary | ICD-10-CM

## 2011-09-01 DIAGNOSIS — G8929 Other chronic pain: Secondary | ICD-10-CM

## 2011-09-01 DIAGNOSIS — Z7901 Long term (current) use of anticoagulants: Secondary | ICD-10-CM

## 2011-09-01 DIAGNOSIS — N2581 Secondary hyperparathyroidism of renal origin: Secondary | ICD-10-CM

## 2011-09-01 DIAGNOSIS — K219 Gastro-esophageal reflux disease without esophagitis: Secondary | ICD-10-CM

## 2011-09-01 DIAGNOSIS — S72009A Fracture of unspecified part of neck of unspecified femur, initial encounter for closed fracture: Secondary | ICD-10-CM

## 2011-09-01 DIAGNOSIS — F3289 Other specified depressive episodes: Secondary | ICD-10-CM

## 2011-09-01 LAB — CBC
HCT: 23.3 % — ABNORMAL LOW (ref 39.0–52.0)
Hemoglobin: 7.6 g/dL — ABNORMAL LOW (ref 13.0–17.0)
MCH: 31.4 pg (ref 26.0–34.0)
MCHC: 32.6 g/dL (ref 30.0–36.0)
MCV: 96.3 fL (ref 78.0–100.0)
RBC: 2.42 MIL/uL — ABNORMAL LOW (ref 4.22–5.81)

## 2011-09-01 LAB — RENAL FUNCTION PANEL
BUN: 59 mg/dL — ABNORMAL HIGH (ref 6–23)
CO2: 27 mEq/L (ref 19–32)
Calcium: 9 mg/dL (ref 8.4–10.5)
Creatinine, Ser: 5.82 mg/dL — ABNORMAL HIGH (ref 0.50–1.35)
GFR calc Af Amer: 12 mL/min — ABNORMAL LOW (ref >90)
Glucose, Bld: 79 mg/dL (ref 70–99)
Phosphorus: 5.2 mg/dL — ABNORMAL HIGH (ref 2.3–4.6)
Sodium: 138 mEq/L (ref 135–145)

## 2011-09-01 MED ORDER — NICOTINE 21 MG/24HR TD PT24: 1.0000 | MEDICATED_PATCH | Freq: Every day | TRANSDERMAL | Status: DC

## 2011-09-01 MED ORDER — SENNA 8.6 MG PO TABS
2.0000 | ORAL_TABLET | Freq: Two times a day (BID) | ORAL | Status: DC
  Administered 2011-09-01 – 2011-09-10 (×16): 17.2 mg via ORAL
  Filled 2011-09-01 (×5): qty 2
  Filled 2011-09-01: qty 1
  Filled 2011-09-01 (×17): qty 2

## 2011-09-01 MED ORDER — ACETAMINOPHEN 325 MG PO TABS: 325.0000 mg | ORAL_TABLET | ORAL | Status: DC | PRN

## 2011-09-01 MED ORDER — METHOCARBAMOL 500 MG PO TABS: 500.0000 mg | ORAL_TABLET | Freq: Three times a day (TID) | ORAL | Status: AC | PRN

## 2011-09-01 MED ORDER — WARFARIN SODIUM 6 MG PO TABS
12.0000 mg | ORAL_TABLET | Freq: Once | ORAL | Status: AC
  Administered 2011-09-01: 12 mg via ORAL
  Filled 2011-09-01: qty 2

## 2011-09-01 MED ORDER — NICOTINE 21 MG/24HR TD PT24
21.0000 mg | MEDICATED_PATCH | Freq: Every day | TRANSDERMAL | Status: DC
  Administered 2011-09-02 – 2011-09-09 (×8): 21 mg via TRANSDERMAL
  Filled 2011-09-01 (×11): qty 1

## 2011-09-01 MED ORDER — SEVELAMER CARBONATE 800 MG PO TABS
1600.0000 mg | ORAL_TABLET | Freq: Three times a day (TID) | ORAL | Status: DC
  Administered 2011-09-01 – 2011-09-10 (×24): 1600 mg via ORAL
  Filled 2011-09-01 (×29): qty 2

## 2011-09-01 MED ORDER — WARFARIN SODIUM 6 MG PO TABS: 12.0000 mg | ORAL_TABLET | Freq: Once | ORAL | Status: DC

## 2011-09-01 MED ORDER — DARBEPOETIN ALFA-POLYSORBATE 60 MCG/0.3ML IJ SOLN
60.0000 ug | INTRAMUSCULAR | Status: AC
  Administered 2011-09-08: 60 ug via INTRAVENOUS
  Filled 2011-09-01: qty 0.3

## 2011-09-01 MED ORDER — HYDROMORPHONE HCL PF 1 MG/ML IJ SOLN
INTRAMUSCULAR | Status: AC
  Administered 2011-09-01: 2 mg via INTRAVENOUS
  Filled 2011-09-01: qty 2

## 2011-09-01 MED ORDER — ESCITALOPRAM OXALATE 20 MG PO TABS
20.0000 mg | ORAL_TABLET | Freq: Every day | ORAL | Status: DC
  Administered 2011-09-02 – 2011-09-10 (×9): 20 mg via ORAL
  Filled 2011-09-01 (×11): qty 1

## 2011-09-01 MED ORDER — HYDROMORPHONE HCL PF 1 MG/ML IJ SOLN
4.0000 mg | INTRAMUSCULAR | Status: DC | PRN
  Administered 2011-09-01: 4 mg via INTRAVENOUS

## 2011-09-01 MED ORDER — WARFARIN - PHARMACIST DOSING INPATIENT
Freq: Every day | Status: DC
  Administered 2011-09-04: 18:00:00
  Administered 2011-09-07: 10

## 2011-09-01 MED ORDER — HYDROMORPHONE HCL PF 2 MG/ML IJ SOLN
4.0000 mg | INTRAMUSCULAR | Status: AC | PRN
  Administered 2011-09-01 – 2011-09-02 (×4): 4 mg via INTRAVENOUS
  Filled 2011-09-01 (×6): qty 2

## 2011-09-01 MED ORDER — HYDROCODONE-ACETAMINOPHEN 10-325 MG PO TABS
2.0000 | ORAL_TABLET | ORAL | Status: DC | PRN
  Administered 2011-09-01 – 2011-09-06 (×18): 2 via ORAL
  Filled 2011-09-01 (×19): qty 2

## 2011-09-01 MED ORDER — FA-PYRIDOXINE-CYANOCOBALAMIN 2.5-25-2 MG PO TABS
1.0000 | ORAL_TABLET | Freq: Every day | ORAL | Status: DC
  Administered 2011-09-02 – 2011-09-10 (×9): 1 via ORAL
  Filled 2011-09-01 (×11): qty 1

## 2011-09-01 MED ORDER — PANTOPRAZOLE SODIUM 40 MG PO TBEC
40.0000 mg | DELAYED_RELEASE_TABLET | Freq: Every day | ORAL | Status: DC
  Administered 2011-09-02 – 2011-09-09 (×6): 40 mg via ORAL
  Filled 2011-09-01 (×6): qty 1

## 2011-09-01 MED ORDER — HYDROCODONE-ACETAMINOPHEN 10-325 MG PO TABS: 2.0000 | ORAL_TABLET | ORAL | Status: DC | PRN

## 2011-09-01 MED ORDER — HYDROMORPHONE HCL PF 2 MG/ML IJ SOLN
4.0000 mg | INTRAMUSCULAR | Status: DC | PRN
  Filled 2011-09-01: qty 4

## 2011-09-01 MED ORDER — HYDROCODONE-ACETAMINOPHEN 5-325 MG PO TABS
ORAL_TABLET | ORAL | Status: AC
  Administered 2011-09-01: 2
  Filled 2011-09-01: qty 2

## 2011-09-01 MED ORDER — ALPRAZOLAM 0.5 MG PO TABS
1.0000 mg | ORAL_TABLET | Freq: Three times a day (TID) | ORAL | Status: DC | PRN
  Administered 2011-09-01 – 2011-09-09 (×12): 1 mg via ORAL
  Filled 2011-09-01: qty 2
  Filled 2011-09-01: qty 1
  Filled 2011-09-01 (×3): qty 2
  Filled 2011-09-01: qty 1
  Filled 2011-09-01 (×7): qty 2

## 2011-09-01 MED ORDER — METHOCARBAMOL 500 MG PO TABS
1000.0000 mg | ORAL_TABLET | Freq: Four times a day (QID) | ORAL | Status: DC
  Administered 2011-09-01 – 2011-09-10 (×30): 1000 mg via ORAL
  Filled 2011-09-01 (×47): qty 2

## 2011-09-01 MED ORDER — WARFARIN SODIUM 6 MG PO TABS
12.0000 mg | ORAL_TABLET | Freq: Once | ORAL | Status: DC
  Filled 2011-09-01: qty 2

## 2011-09-01 MED ORDER — OXYCODONE HCL 15 MG PO TB12
30.0000 mg | ORAL_TABLET | Freq: Two times a day (BID) | ORAL | Status: DC
  Administered 2011-09-01 – 2011-09-02 (×2): 30 mg via ORAL
  Filled 2011-09-01 (×2): qty 2

## 2011-09-01 MED ORDER — HYDROMORPHONE HCL 2 MG PO TABS
4.0000 mg | ORAL_TABLET | Freq: Four times a day (QID) | ORAL | Status: DC | PRN
  Administered 2011-09-03: 4 mg via ORAL
  Filled 2011-09-01: qty 2

## 2011-09-01 MED ORDER — DARBEPOETIN ALFA-POLYSORBATE 60 MCG/0.3ML IJ SOLN
INTRAMUSCULAR | Status: AC
  Administered 2011-09-01: 60 ug via INTRAVENOUS
  Filled 2011-09-01: qty 0.3

## 2011-09-01 NOTE — PMR Pre-admission (Signed)
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 Date: A 10/28/09-08/01/11B     Deduct: $1184      Out of Pocket Max: $0      Life Max: unlimited CIR: 100%      SNF: 100 Days   LBD = 03/18/11 Outpatient: 80%     Co-Pay: 20% Home Health: 100%      Co-Pay: none DME: 80%     Co-Pay: 20% Providers: patient's choice  SECONDARY: [employer] [place]      Policy#: [id number]      Subscriber: Lamona Curl CM Name:        Phone#:       Fax#:   Pre-Cert#:        Employer:  Disabled Benefits:  Phone #: [phone]     Name:   Eff. 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 In addition he has a chronic knee weakness.   Right great toe with decreased sensation in the right foot following one of his hospitalizations. Of note is that he lost approximately 400 pounds after  his gastric bypass surgery. At baseline he was able to ambulate 50 feet with limited endurance. Admitted March 27 after a fall at home sustaining a left intertrochanteric hip fracture.  His Coumadin was held until INR less than 2.0 and underwent left hip open reduction internal fixation with hip screw March 29 per Dr. [name]. Nonweightbearing left lower extremity for 4-6 weeks. Chronic Coumadin has been resumed for history of DVT. Dialysis as advised per [facility]. Noted anemia 7.2.  The patient used a slide board for transfers at home. Ambulated very limited the distances from his bed to his wheelchair using a walker.  Past Medical History  Past Medical History  Diagnosis Date  . Hypertension   . GERD (gastroesophageal reflux disease)   . S/P laparoscopic cholecystectomy   .  DVT (deep venous thrombosis)   . Renal disorder   . Complication of anesthesia     " I am hard  to wake up "  . Diabetes mellitus     Family History  family history is not on file.  Prior Rehab/Hospitalizations: Was at [facility] for about 6 months.  No inpatient rehab stays.   Current Medications  Current facility-administered medications:ALPRAZolam (XANAX) tablet 1 mg, 1 mg, Oral, TID PRN, Theda Belfast Dhungel, MD, 1 mg at 08/28/11 2201;  darbepoetin (ARANESP) injection 60 mcg, 60 mcg, Intravenous, Q Thu-HD, Sadie Haber, MD, 60 mcg at 09/01/11 0719;  docusate sodium (COLACE) capsule 100 mg, 100 mg, Oral, BID, Nishant Dhungel, MD, 100 mg at 08/31/11 2211 escitalopram (LEXAPRO) tablet 20 mg, 20 mg, Oral, Daily, Nishant Dhungel, MD, 20 mg at 08/31/11 1048;  folic acid-pyridoxine-cyancobalamin (FOLTX) 2.5-25-2 MG per tablet 1 tablet, 1 tablet, Oral, Daily, Nishant Dhungel, MD, 1 tablet at 08/31/11 1049;  heparin injection 3,100 Units, 20 Units/kg, Dialysis, PRN, Lauris Poag, MD, 3,100 Units at 08/30/11 0955 heparin injection 3,100 Units, 20 Units/kg, Dialysis, PRN, Lauris Poag, MD, 3,100 Units at  09/01/11 4540;  HYDROcodone-acetaminophen (NORCO) 10-325 MG per tablet 2 tablet, 2 tablet, Oral, Q4H PRN, Kathlen Mody, MD, 2 tablet at 08/31/11 2351;  HYDROcodone-acetaminophen (NORCO) 5-325 MG per tablet, , , , , 2 tablet at 09/01/11 1018;  HYDROmorphone (DILAUDID) injection 2 mg, 2 mg, Intravenous, Q3H PRN, Eddie North, MD, 2 mg at 09/01/11 1033 methocarbamol (ROBAXIN) tablet 500 mg, 500 mg, Oral, Q8H PRN, Kathlen Mody, MD, 500 mg at 08/31/11 2351;  naloxone [facility]) injection 0.4 mg, 0.4 mg, Intravenous, PRN, Caroline More, NP;  nicotine (NICODERM CQ - dosed in mg/24 hours) patch 21 mg, 21 mg, Transdermal, Daily, Nishant Dhungel, MD, 21 mg at 08/31/11 1000;  oxyCODONE (OXYCONTIN) 12 hr tablet 15 mg, 15 mg, Oral, Q12H, Nishant Dhungel, MD, 15 mg at 08/31/11 2211 pantoprazole (PROTONIX) EC tablet 40 mg, 40 mg, Oral, Q1200, Nishant Dhungel, MD, 40 mg at 08/31/11 1300;  senna (SENOKOT) tablet 17.2 mg, 2 tablet, Oral, BID, Nishant Dhungel, MD, 17.2 mg at 08/31/11 2213;  sevelamer (RENVELA) tablet 1,600 mg, 1,600 mg, Oral, TID WC, Nishant Dhungel, MD, 1,600 mg at 08/31/11 1748;  sodium chloride 0.9 % injection 9 mL, 9 mL, Intravenous, PRN, Caroline More, NP warfarin (COUMADIN) tablet 12 mg, 12 mg, Oral, ONCE-1800, Anh P Pham, PHARMD, 12 mg at 08/31/11 1846;  warfarin (COUMADIN) tablet 12 mg, 12 mg, Oral, ONCE-1800, Anh P Pham, PHARMD;  Warfarin - Pharmacist Dosing Inpatient, , Does not apply, q1800, Cleon Dew, PHARMD;  zolpidem (AMBIEN) tablet 5 mg, 5 mg, Oral, QHS PRN, Caroline More, NP, 5 mg at 08/31/11 2213  Patients Current Diet: Renal  Precautions / Restrictions Precautions Precautions: Fall Required Braces or Orthoses: Yes Other Brace/Splint: AFO for Left foot drop.  (ordered 08/30/2011) Restrictions Weight Bearing Restrictions: Yes LLE Weight Bearing: Non weight bearing   Prior Activity Level Community (5-7x/wk): Went out every day, used Scientist, research (medical) /  Equipment Home Assistive Devices/Equipment: Oxygen;Bedside Commode;Walker (specify type) Home Adaptive Equipment: Wheelchair - manual;Walker - rolling;Bedside commode/3-in-1  Prior Functional Level Prior Function Level of Independence: Independent with transfers (non ambulatory) Driving: Yes Vocation: On disability  Current Functional Level Cognition  Arousal/Alertness: Awake/alert Overall Cognitive Status: Appears within functional limits for tasks assessed Orientation Level: Oriented X4    Sensation       Coordination  ADLs       Mobility  Bed Mobility: Yes Supine to Sit: Patient percentage (comment);3: Mod assist Supine to Sit Details (indicate cue type and reason): Pt required less assistance to initiate Left hip Adduction (painfull).  Pt pulling on my hand to flex trunk do to pain in hip.     Sit to Supine: 3: Mod assist;HOB flat Sit to Supine - Details (indicate cue type and reason): Assist for Left LE management due to pain.  Cues lean to the right elbow vs falling back into supine.  Pt tolerated much better than last session.      Transfers  Transfers: Yes Sit to Stand: 1: +2 Total assist;From elevated surface;From bed;Patient percentage (comment) (Pt 50% repeated 3 trials.  ) Sit to Stand Details (indicate cue type and reason): Pt having difficulty NWB on Left LE.  Second person assisting to position Left LE forward to reduce amount of weight pt able to put on Left LE.  Pt pushing on RW with bilateral UEs.  Manual facilitation to shift pt's wt to the right to avoid wb on left LE.  Tactile cues for anterior wt shift and manual facilitaiton to stabilize pt and assitst with management of pt's body wt until pt could achieve fulll Right knee and trunk extension.  Despite  repeated cueing pt unable to maintain NWB on Lt LE.   Stand to Sit: 4: Min assist;With upper extremity assist;To bed (3 trials) Stand to Sit Details: Second person to lower bed while pt standing. Pt  required assistance to contol descent to bed mostly via verbal and tactile cues.   (Repeated 3 times) Stand Pivot Transfers: 1: +2 Total assist Stand Pivot Transfer Details (indicate cue type and reason): Attempted but pt putting too much wt through left LE.  Unable to pivot on Rt LE and maintain NWB in Left LE.  Lateral/Scoot Transfers: Other (comment) (Will attempt next session.  )    Ambulation / Gait / Stairs / Wheelchair Mobility  Ambulation/Gait Ambulation/Gait: No Ambulation/Gait Assistance: 1: +2 Total assist Ambulation/Gait Assistance Details (indicate cue type and reason): Attempted but pt unable to advance Rt LE while NWB on Right LE.   Assistive device: Rolling walker Stairs: No Wheelchair Mobility Wheelchair Mobility: No    Posture / Balance Posture/Postural Control Posture/Postural Control: No significant limitations Static Sitting Balance Static Sitting - Balance Support: Feet supported;Bilateral upper extremity supported;No upper extremity supported Static Sitting - Level of Assistance: 6: Modified independent (Device/Increase time) Static Sitting - Comment/# of Minutes: 10+ minutes on EOB with/without UE assistance.  Static Standing Balance Static Standing - Balance Support: Bilateral upper extremity supported Static Standing - Level of Assistance: 5: Stand by assistance (3 trials ) Static Standing - Comment/# of Minutes: 30 seconds to 1 min standing with verbal and tactile cues for trunk extension and repeated cues for NWB on Lt LE.  Pt unable to achieve NWB on L LE on any trial.       Previous Home Environment Living Arrangements: Spouse/significant other Lives With: Spouse;Family Receives Help From: Family Type of Home: House Home Layout: Two level;Able to live on main level with bedroom/bathroom Home Access: Ramped entrance Home Care Services: No  Discharge Living Setting Plans for Discharge Living Setting: Patient's home;House (Lives with wife and 4  children, Dtrs 83, 12 and Twin sons 10) Type of Home at Discharge: House Discharge Home Layout: Two level;Able to live on main level with bedroom/bathroom Discharge Home Access: Ramped entrance Do you have any  problems obtaining your medications?: No  Social/Family/Support Systems Patient Roles: Spouse;Parent Contact Information: Filippo Puls - wife and Cammie Spackman - sister Anticipated Caregiver: wife and sister Anticipated Caregiver's Contact Information: Judeth Cornfield - wife [phone]   Cammie - sister [phone] Ability/Limitations of Caregiver: Wife and sister can assist Caregiver Availability: 24/7 Discharge Plan Discussed with Primary Caregiver: Yes Is Caregiver In Agreement with Plan?: Yes Does Caregiver/Family have Issues with Lodging/Transportation while Pt is in Rehab?: No  Goals/Additional Needs Patient/Family Goal for Rehab: PT/OT min A goals Expected length of stay: 2 weeks Cultural Considerations: None Dietary Needs: Renal with 1200 ml fluid restriction, thin liquids Equipment Needs: TBD Additional Information: Orange contact precautions Pt/Family Agrees to Admission and willing to participate: Yes Program Orientation Provided & Reviewed with Pt/Caregiver Including Roles  & Responsibilities: Yes  Patient Condition: Please see physician update to information in consult dated 08/29/11.  Preadmission Screen Completed By:  Trish Mage, 09/01/2011 11:55 AM ______________________________________________________________________   Discussed status with Dr. Riley Kill on 04/04/13at 1208 and received telephone approval for admission today.  Admission Coordinator:  Trish Mage, time 1208/Date04/04/13

## 2011-09-01 NOTE — Discharge Summary (Signed)
DISCHARGE SUMMARY  [name]  MR#: [id number]  DOB:[birth year]  Date of Admission: 08/24/2011 Date of Discharge: 09/01/2011  Attending Physician:Edie Darley  Patient's PCP:No primary provider on file.  Consults:Treatment Team:  Cecille Aver, MD- ORTHOPEDIC CONSULT REHABILITATION CONSULT   Discharge Diagnoses: Present on Admission:  LEFT Intertrochanteric fracture of the femur s/p ORIF. 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 patient was transferred to [facility] for further management after speaking with ortho consult.   Hospital Course: 1.* Left Intertrochanteric fracture of femur:  He was transferred from the [facility]. An orthopedic consult was obtained.  And he underwent surgical repair with hip screw placement on 08/26/11. He was initially started on PCA morphine for pain which was later weaned to prn dilaudid. He is currently being discharged on oxycontin and hydrocodone and robaxin for pain to in patient rehabilitation. Coumadin was restarted and his INR is currently therapeutic.   2. ESRD (end stage renal disease) on dialysis ; continue with HD as recommended by Renal.   3.Hypertension: controlled  4. Morbid obesity  S/P gastric sleeve in 2011   5. GERD (gastroesophageal reflux disease)  Cont PPI   6.OSA (obstructive sleep apnea)  informs being on CPAP at home , continue the same.    7.DVT (deep venous thrombosis) of left leg  On coumadin since march 2012, therapetic INR.  Resumeed coumadin post op  8. Anemia: normocytic. Most likely a combination of anemia of chronic disease from ESRD and anemia from blood loss from surgery. He underwent 2 units PRBC transfusion. His hemoglobin is currently stable.       Medication List  As of 09/01/2011 12:49 PM   STOP taking these medications         cefdinir 300 MG capsule      flurazepam 30 MG capsule      warfarin 10 MG tablet         TAKE these medications         ALPRAZolam 1 MG tablet   Commonly known as: XANAX   Take 1 mg by mouth every 6 (six) hours as needed. anxiety      cetirizine 10 MG tablet   Commonly known as: ZYRTEC   Take 10 mg by mouth at bedtime. At bedtime      epoetin alfa 40981 UNIT/ML injection   Commonly known as: EPOGEN,PROCRIT   Inject 6,500 Units into the skin 3 (three) times a week. Receives at [facility]. Last visit 08/23/11      escitalopram 20 MG tablet   Commonly known as: LEXAPRO   Take 20 mg by mouth daily.      Folic Acid-Vit B6-Vit B12 2.5-25-1 MG Tabs   Commonly known as: FOLBEE   Take 1 tablet by mouth daily.      HEPARIN SODIUM FLUSH IV   Inject 8,600 Units into the vein 3 (three) times a week. Flush line with 8600 units then runs 3000  units per hour during dialysis  Last treatment 08-23-11      HYDROcodone-acetaminophen 10-325 MG per tablet   Commonly known as: NORCO   Take 2 tablets by mouth every 4 (four) hours as needed.      methocarbamol 500 MG tablet   Commonly known as: ROBAXIN   Take 1 tablet (500 mg total) by mouth every 8 (eight) hours as needed.      nicotine 21 mg/24hr patch   Commonly known as: NICODERM CQ - dosed in mg/24 hours   Place 1 patch onto the skin daily.      omeprazole 20 MG capsule   Commonly known as: PRILOSEC   Take 20 mg by mouth 2 (two) times daily.      oxycodone 30 MG Tb12   Commonly known as: OXYCONTIN   Take 30 mg by  mouth.      senna 8.6 MG Tabs   Commonly known as: SENOKOT   Take 1 tablet by mouth 2 (two) times daily.      sevelamer 800 MG tablet   Commonly known as: RENVELA   Take 1,600 mg by mouth 3 (three) times daily with meals.      warfarin 6 MG tablet   Commonly known as: COUMADIN   Take 2 tablets (12 mg total) by mouth one time only at 6 PM.             Day of Discharge BP 106/47  Pulse 77  Temp(Src) 97.4 F (36.3 C) (Oral)  Resp 20  Ht 6' (1.829 m)  Wt 151.8 kg (334 lb 10.5 oz)  BMI 45.39 kg/m2  SpO2 92%  Physical Exam: Morbidly obese male lying in bed with in NAD  HEENT: no pallor, moist oral mucosa  Chest : clear breath sounds b/l, no added sounds  Cvs: s1 &s2 Regular no added sounds  Abd: obese, laparotomy scar with a superficial skin wound which is clean and dry, Soft. Non-tender, non-distended, bowel sounds are normal  Ext: dressing over surgical site, no bleeding , chr left foot drop  CNS: AAO X 3, non focal   Results for orders placed during the hospital encounter of 08/24/11 (from the past 24 hour(s))  PROTIME-INR     Status: Abnormal   Collection Time   09/01/11  7:20 AM      Component Value Range   Prothrombin Time 24.1 (*) 11.6 - 15.2 (seconds)   INR 2.12 (*) 0.00 - 1.49   CBC     Status: Abnormal   Collection Time   09/01/11  7:20 AM      Component Value Range   WBC 8.4  4.0 - 10.5 (K/uL)   RBC 2.42 (*) 4.22 - 5.81 (MIL/uL)   Hemoglobin 7.6 (*) 13.0 - 17.0 (g/dL)   HCT 16.1 (*) 09.6 - 52.0 (%)   MCV 96.3  78.0 - 100.0 (fL)   MCH 31.4  26.0 - 34.0 (pg)   MCHC 32.6  30.0 - 36.0 (g/dL)   RDW 04.5 (*) 40.9 - 15.5 (%)   Platelets 193  150 - 400 (K/uL)  RENAL FUNCTION PANEL     Status: Abnormal   Collection Time   09/01/11  7:20 AM      Component Value Range   Sodium 138  135 - 145 (mEq/L)   Potassium 4.6  3.5 - 5.1 (mEq/L)   Chloride 101  96 - 112 (mEq/L)   CO2 27  19 - 32 (mEq/L)   Glucose, Bld 79  70 - 99 (mg/dL)   BUN 59 (*) 6 - 23 (mg/dL)    Creatinine, Ser 1.61 (*) 0.50 - 1.35 (mg/dL)   Calcium 9.0  8.4 - 09.6 (mg/dL)   Phosphorus 5.2 (*) 2.3 - 4.6 (mg/dL)   Albumin 2.3 (*) 3.5 - 5.2 (g/dL)   GFR calc non Af Amer 11 (*) >90 (mL/min)   GFR calc Af Amer 12 (*) >90 (mL/min)    Disposition: Inpatient Rehabilitation.   Follow-up Appts: Discharge Orders    Future Orders Please Complete By Expires   Discharge instructions      Comments:   Follow up with PCP  Recommended.   Activity as tolerated - No restrictions           Tests Needing Follow-up: INR  Time spent in discharge (includes decision making & examination of pt): 62 minutes  Signed: Sheldon Sem 09/01/2011, 12:49 PM

## 2011-09-01 NOTE — Progress Notes (Signed)
ANTICOAGULATION CONSULT NOTE - Follow Up Consult  Pharmacy Consult for Coumadin Indication: Hx DVT  Allergies  Allergen Reactions  . Zosyn Hives    Patient Measurements:   Heparin Dosing Weight:  Vital Signs: Temp: 98.2 F (36.8 C) (04/04 1203) Temp src: Oral (04/04 1203) BP: 93/53 mmHg (04/04 1203) Pulse Rate: 78  (04/04 1203)  Labs:  Basename 09/01/11 0720 08/31/11 0916 08/31/11 0605 08/30/11 1011  HGB 7.6* 8.1* -- --  HCT 23.3* 24.5* -- 17.6*  PLT 193 168 -- 195  APTT -- -- -- --  LABPROT 24.1* -- 21.8* 21.5*  INR 2.12* -- 1.86* 1.83*  HEPARINUNFRC -- -- -- --  CREATININE 5.82* -- -- 6.83*  CKTOTAL -- -- -- --  CKMB -- -- -- --  TROPONINI -- -- -- --   The CrCl is unknown because both a height and weight (above a minimum accepted value) are required for this calculation.  Assessment: Patient is a 46 y.o M on coumadin for Hx DVT.  Hgb decreased slightly to 7.6 today.  No bleeding noted.  INR is at goal.   Goal of Therapy:  INR 2-3   Plan:  1) repeat Coumadin 12mg  PO x1 today  [name] 09/01/2011,4:32 PM

## 2011-09-01 NOTE — Progress Notes (Signed)
Dialysis treatment in progress. Hemodynamically tolerated. 4 liter goal today. No dialysis related complications.  AVF functioning well.  Assessment/ Plan:  Pt is a 46 y.o. yo male ESRD who was admitted on 08/24/2011 with Hip fracture  Assessment/Plan:  1. Hip fracture- s/p ORIF last Friday.  2. ESRD- TTS via AVF-  3. Anemia- s/p RBCs  4. Secondary hyperparathyroidism- on renvela, not on any vitamin D as OP  5. HTN/volume- BP low after wgt down with HD  Chuck Caban C

## 2011-09-01 NOTE — Progress Notes (Signed)
   CARE MANAGEMENT NOTE 09/01/2011  Patient:  [name], [name]   Account Number:  [id number]  Date Initiated:  08/25/2011  Documentation initiated by:  Donn Pierini  Subjective/Objective Assessment:   Pt admitted with hip fx, will need OR repair     Action/Plan:   PTA pt lived at home with spouse, hx ESRD on HD t/th/sat   Anticipated DC Date:  09/01/2011   Anticipated DC Plan:  IP REHAB FACILITY  In-house referral  Clinical Social Worker      DC Planning Services  CM consult      Choice offered to / List presented to:             Status of service:  In process, will continue to follow Medicare Important Message given?   (If response is "NO", the following Medicare IM given date fields will be blank) Date Medicare IM given:   Date Additional Medicare IM given:    Discharge Disposition:    Per UR Regulation:  Reviewed for med. necessity/level of care/duration of stay  If discussed at Long Length of Stay Meetings, dates discussed:    Comments:  09/01/11 11:46 Letha Cape RN, BSN [phone] received call from Genie with [facility], stating they can take paatient today, informed MD of this information.  I spoke with patient and he said yes he wants to go to inpt rehab here at the hospital.  MD states she will speak with the patient because they will be transitioning patient from iv pain meds to oral pain meds and this was an issue for the patient, plan is for patient to be dc to inpt rehab today.  08/30/11- 1440- Donn Pierini RN, BSN [phone] CM following for d/c plannning. CIR consulted for possible admission- pt will need to be able to participate in therapy- otherwise may need to look into SNF.  08/30/11 Onnie Boer, RN, BSN 1229 UR COMPLETED  08/25/11- 1400 - Donn Pierini RN, BSN [phone] Plan for OR on Friday 08/26/11 for hip repair- PT/OT after surgery. CM will follow for d/c needs/planning.

## 2011-09-01 NOTE — Progress Notes (Signed)
Rehab admissions - I spoke with wife by phone and gave her information about inpatient rehab.  Answered wife's questions.  I spoke with patient and he is agreeable to inpatient rehab admission today.  Bed available and can plan to admit to inpatient rehab today.  Call me for questions.  Pager [phone]

## 2011-09-01 NOTE — Progress Notes (Signed)
ANTICOAGULATION CONSULT NOTE - Follow Up Consult  Pharmacy Consult for Coumadin Indication: Hx DVT  Allergies  Allergen Reactions  . Zosyn Hives    Patient Measurements: Height: 6' (182.9 cm) Weight: 345 lb 3.9 oz (156.6 kg) IBW/kg (Calculated) : 77.6  Heparin Dosing Weight:  Vital Signs: Temp: 97.3 F (36.3 C) (04/04 0610) Temp src: Oral (04/04 0610) BP: 106/55 mmHg (04/04 0830) Pulse Rate: 71  (04/04 0830)  Labs:  Basename 09/01/11 0720 08/31/11 0916 08/31/11 0605 08/30/11 1011  HGB 7.6* 8.1* -- --  HCT 23.3* 24.5* -- 17.6*  PLT 193 168 -- 195  APTT -- -- -- --  LABPROT 24.1* -- 21.8* 21.5*  INR 2.12* -- 1.86* 1.83*  HEPARINUNFRC -- -- -- --  CREATININE 5.82* -- -- 6.83*  CKTOTAL -- -- -- --  CKMB -- -- -- --  TROPONINI -- -- -- --   Estimated Creatinine Clearance: 25.3 ml/min (by C-G formula based on Cr of 5.82).  Assessment: Patient is a 46 y.o M on coumadin for Hx DVT.  Hgb decreased slightly to 7.6 today.  No bleeding noted.  INR is at goal.   Goal of Therapy:  INR 2-3   Plan:  1) repeat Coumadin 12mg  PO x1 today  Joshual Terrio P 09/01/2011,8:52 AM

## 2011-09-01 NOTE — Progress Notes (Signed)
Pt admitted to 4009 from 6700 via beriatric bed. Pt orienetd to room, bed controls and call light. Pt declined to view safety video. Pt aware of safety plan and agreement and verbalizes understanding. Oriented to schedules, meal time and therapy schedules, lead nurse and white board for communication. See CHL for details of head to toe assessment. Will continue to monitor. Roberts-VonCannon, Feleica Fulmore Elon Jester

## 2011-09-01 NOTE — Progress Notes (Signed)
Pt requesting to speak to doctor about current pain regimen. Pt states " this is not going to work. I want to speak to the doctor." If I can't get relief then I would just as soon go home" Marissa Nestle, PA made aware. Order received to give dilaudid 4 mg IV every 4 hrs prn for severe pain. Will discuss plan of care with patient. Continue to monitor. Roberts-VonCannon, Ahmani Daoud Elon Jester

## 2011-09-01 NOTE — Progress Notes (Signed)
   CARE MANAGEMENT NOTE 09/01/2011  Patient:  [name], [name]   Account Number:  [id number]  Date Initiated:  08/25/2011  Documentation initiated by:  Donn Pierini  Subjective/Objective Assessment:   Pt admitted with hip fx, will need OR repair     Action/Plan:   PTA pt lived at home with spouse, hx ESRD on HD t/th/sat   Anticipated DC Date:  09/01/2011   Anticipated DC Plan:  IP REHAB FACILITY  In-house referral  Clinical Social Worker      DC Associate Professor  CM consult      PAC Choice  IP REHAB   Choice offered to / List presented to:  C-1 Patient           Status of service:  Completed, signed off Medicare Important Message given?   (If response is "NO", the following Medicare IM given date fields will be blank) Date Medicare IM given:   Date Additional Medicare IM given:    Discharge Disposition:  IP REHAB FACILITY  Per UR Regulation:  Reviewed for med. necessity/level of care/duration of stay  If discussed at Long Length of Stay Meetings, dates discussed:    Comments:  09/01/11 11:46 Letha Cape RN, BSN [phone] received call from Genie with [facility], stating they can take paatient today, informed MD of this information.  I spoke with patient and he said yes he wants to go to inpt rehab here at the hospital.  MD states she will speak with the patient because they will be transitioning patient from iv pain meds to oral pain meds and this was an issue for the patient, plan is for patient to be dc to inpt rehab today.  Patient for dc to inpt rehab.  08/30/11- 1440- Donn Pierini RN, BSN [phone] CM following for d/c plannning. CIR consulted for possible admission- pt will need to be able to participate in therapy- otherwise may need to look into SNF.  08/30/11 Onnie Boer, RN, BSN 1229 UR COMPLETED  08/25/11- 1400 - Donn Pierini RN, BSN [phone] Plan for OR on Friday 08/26/11 for hip repair- PT/OT after surgery. CM will follow for d/c needs/planning.

## 2011-09-01 NOTE — Progress Notes (Signed)
Pt alert x 4, able to make needs known. Pt seem to be pre-occupied with his computer, show no interest in answering most of the questions ask.

## 2011-09-01 NOTE — Progress Notes (Signed)
Patient reports that his hip pain is well-controlled.  He has discussed his situation with his family and would like inpatient rehab.  Left hip wound is clean and dry.  Assessment: s/p ORIF left hip intertrochanteric fx  Plan: 20 lbs weightbearing left leg  Inpatient rehab.  Suture removal on POD #14

## 2011-09-02 ENCOUNTER — Inpatient Hospital Stay (HOSPITAL_COMMUNITY): Payer: BC Managed Care – PPO

## 2011-09-02 DIAGNOSIS — E669 Obesity, unspecified: Secondary | ICD-10-CM

## 2011-09-02 DIAGNOSIS — M47817 Spondylosis without myelopathy or radiculopathy, lumbosacral region: Secondary | ICD-10-CM

## 2011-09-02 DIAGNOSIS — N189 Chronic kidney disease, unspecified: Secondary | ICD-10-CM

## 2011-09-02 DIAGNOSIS — Z5189 Encounter for other specified aftercare: Secondary | ICD-10-CM

## 2011-09-02 DIAGNOSIS — S72143A Displaced intertrochanteric fracture of unspecified femur, initial encounter for closed fracture: Secondary | ICD-10-CM

## 2011-09-02 DIAGNOSIS — W19XXXA Unspecified fall, initial encounter: Secondary | ICD-10-CM

## 2011-09-02 DIAGNOSIS — S72009A Fracture of unspecified part of neck of unspecified femur, initial encounter for closed fracture: Secondary | ICD-10-CM

## 2011-09-02 LAB — PROTIME-INR: INR: 2.38 — ABNORMAL HIGH (ref 0.00–1.49)

## 2011-09-02 MED ORDER — HEPARIN SODIUM (PORCINE) 1000 UNIT/ML DIALYSIS
20.0000 [IU]/kg | INTRAMUSCULAR | Status: DC | PRN
  Administered 2011-09-03: 3000 [IU] via INTRAVENOUS_CENTRAL
  Filled 2011-09-02: qty 3

## 2011-09-02 MED ORDER — WARFARIN SODIUM 6 MG PO TABS
12.0000 mg | ORAL_TABLET | Freq: Once | ORAL | Status: AC
  Administered 2011-09-02: 12 mg via ORAL
  Filled 2011-09-02: qty 2

## 2011-09-02 MED ORDER — HYDROMORPHONE HCL PF 2 MG/ML IJ SOLN
4.0000 mg | INTRAMUSCULAR | Status: DC | PRN
  Administered 2011-09-02 – 2011-09-06 (×16): 4 mg via INTRAVENOUS
  Filled 2011-09-02 (×2): qty 2
  Filled 2011-09-02 (×2): qty 1
  Filled 2011-09-02: qty 2
  Filled 2011-09-02 (×2): qty 1
  Filled 2011-09-02 (×7): qty 2
  Filled 2011-09-02: qty 1
  Filled 2011-09-02 (×2): qty 2
  Filled 2011-09-02: qty 1
  Filled 2011-09-02: qty 2

## 2011-09-02 MED ORDER — OXYCODONE HCL 15 MG PO TB12
45.0000 mg | ORAL_TABLET | Freq: Two times a day (BID) | ORAL | Status: DC
  Administered 2011-09-02 – 2011-09-04 (×5): 45 mg via ORAL
  Filled 2011-09-02 (×5): qty 3

## 2011-09-02 NOTE — H&P (Signed)
Physical Medicine and Rehabilitation Admission H&P  [name] is an 46 y.o. male.  Chief Complaint   Patient presents with   .  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 Of note is that he lost approximately 400 pounds after his gastric bypass surgery. At baseline he was able to ambulate 50 feet with limited endurance and used a sliding board for transfers at home and also driving. Admitted March 27 after a fall at home sustaining a left intertrochanteric hip fracture.  His Coumadin was held until INR less than 2.0 and underwent left hip open reduction internal fixation with hip screw March 29 per Dr. [name]. Advise 20 pounds to nonweightbearing left lower extremity for 4-6 weeks. Patient has had difficulty maintaining his weightbearing status. Chronic Coumadin has been resumed for history of DVT. Patient on contact precautions for history of VRE. Dialysis as advised per [facility]. Noted anemia 7.2 April 1 and transfuse with dialysis. Patient with chronic pain presently on OxyContin 15 mg every 12 hours as he had been on 30 mg twice daily prior to admission. Physical therapy ongoing with plan to order a left AFO brace that was done 08/30/11 for chronic foot drop with recommendations for physical medicine rehabilitation consult to consider inpatient rehabilitation services  Review of Systems  Respiratory: Positive for shortness of breath.  Gastrointestinal: Positive for constipation.  Musculoskeletal: Positive for myalgias, joint and back pain.  Psychiatric/Behavioral: Positive for depression  Past Medical History   Diagnosis  Date   .  Hypertension    .  GERD (gastroesophageal reflux disease)    .   S/P laparoscopic cholecystectomy    .  DVT (deep venous thrombosis)    .  Renal disorder    .  Complication of anesthesia      " I am hard to wake up "   .  Diabetes mellitus     Past Surgical History   Procedure  Date   .  Gastric sleeve    .  Cholecystectomy, laparoscopic    .  Kidney stones    .  Dialysis port     History reviewed. No pertinent family history.  Social History: reports that he has been smoking Cigarettes. He has never used smokeless tobacco. He reports that he does not drink alcohol or use illicit drugs.  Allergies:  Allergies   Allergen  Reactions   .  Zosyn  Hives    Medications Prior to Admission   Medication  Dose  Route  Frequency  Provider  Last Rate  Last Dose   .  ALPRAZolam (XANAX) tablet 1 mg  1 mg  Oral  TID PRN  Eddie North, MD   1 mg at 08/28/11 2201   .  darbepoetin (ARANESP) injection 60 mcg  60 mcg  Intravenous  Q Thu-HD  Sadie Haber, MD   60 mcg at 08/25/11 1454   .  docusate sodium (COLACE) capsule 100 mg  100 mg  Oral  BID  Nishant Dhungel, MD   100 mg at 08/29/11 2236   .  escitalopram (LEXAPRO) tablet 20 mg  20 mg  Oral  Daily  Nishant Dhungel, MD   20 mg at 08/29/11 0914   .  folic acid-pyridoxine-cyancobalamin (FOLTX)  2.5-25-2 MG per tablet 1 tablet  1 tablet  Oral  Daily  Nishant Dhungel, MD   1 tablet at 08/29/11 0914   .  heparin injection 3,100 Units  20 Units/kg  Dialysis  PRN  Lauris Poag, MD     .  HYDROcodone-acetaminophen [facility]) 10-325 MG per tablet 1 tablet  1 tablet  Oral  Q4H PRN  Eddie North, MD   1 tablet at 08/30/11 0331   .  HYDROmorphone (DILAUDID) injection 0.5 mg  0.5 mg  Intravenous  Q5 min PRN  Rivka Barbara, MD   0.5 mg at 08/26/11 1551   .  HYDROmorphone (DILAUDID) injection 1 mg  1 mg  Intravenous  Once  Joya Gaskins, MD   1 mg at 08/24/11 1035   .  HYDROmorphone (DILAUDID) injection 1 mg  1 mg  Intravenous  Once  Joya Gaskins, MD   1 mg at 08/24/11 1113   .  HYDROmorphone (DILAUDID)  injection 2 mg  2 mg  Intravenous  Q3H PRN  Nishant Dhungel, MD   2 mg at 08/30/11 0806   .  HYDROmorphone PCA 0.3 mg/mL (DILAUDID) 0.3 mg/mL infusion         .  HYDROmorphone PCA 0.3 mg/mL (DILAUDID) 0.3 mg/mL infusion         .  naloxone [facility]) injection 0.4 mg  0.4 mg  Intravenous  PRN  Caroline More, NP      And   .  sodium chloride 0.9 % injection 9 mL  9 mL  Intravenous  PRN  Caroline More, NP     .  nicotine (NICODERM CQ - dosed in mg/24 hours) patch 21 mg  21 mg  Transdermal  Daily  Nishant Dhungel, MD   21 mg at 08/29/11 0913   .  oxyCODONE (OXYCONTIN) 12 hr tablet 15 mg  15 mg  Oral  Q12H  Nishant Dhungel, MD   15 mg at 08/29/11 0912   .  pantoprazole (PROTONIX) EC tablet 40 mg  40 mg  Oral  Q1200  Nishant Dhungel, MD   40 mg at 08/29/11 1211   .  phytonadione (VITAMIN K) tablet 5 mg  5 mg  Oral  Once  Joya Gaskins, MD   5 mg at 08/24/11 1351   .  phytonadione (VITAMIN K) tablet 5 mg  5 mg  Oral  Once  Nishant Dhungel, MD   5 mg at 08/25/11 2023   .  senna (SENOKOT) tablet 17.2 mg  2 tablet  Oral  BID  Nishant Dhungel, MD   17.2 mg at 08/29/11 2236   .  sevelamer (RENVELA) tablet 1,600 mg  1,600 mg  Oral  TID WC  Nishant Dhungel, MD   1,600 mg at 08/29/11 1727   .  warfarin (COUMADIN) tablet 12 mg  12 mg  Oral  Once  Dannielle Karvonen [place], PHARMD   12 mg at 08/26/11 2053   .  warfarin (COUMADIN) tablet 12 mg  12 mg  Oral  ONCE-1800  Nishant Dhungel, MD   12 mg at 08/27/11 1744   .  warfarin (COUMADIN) tablet 12 mg  12 mg  Oral  ONCE-1800  Anh P Pham, PHARMD   12 mg at 08/29/11 1727   .  warfarin (COUMADIN) tablet 15 mg  15 mg  Oral  ONCE-1800  Nishant Dhungel, MD   15 mg at 08/28/11 1721   .  Warfarin - Pharmacist Dosing  Inpatient   Does not apply  q1800  Dannielle Karvonen [place], PHARMD     .  DISCONTD: 0.9 % irrigation (POUR BTL)    PRN  Velna Ochs, MD   1,000 mL at 08/26/11 1208   .  DISCONTD: calcitRIOL (CALCIJEX) 1 MCG/ML injection 1 mcg  1 mcg  Intravenous  Q T,Th,Sa-HD   Cecille Aver, MD     .  DISCONTD: darbepoetin (ARANESP) injection 60 mcg  60 mcg  Intravenous  Q Thu-HD  Cecille Aver, MD     .  DISCONTD: Folic Acid-Vit B6-Vit B12 (FOLBEE) tablet 1 tablet  1 tablet  Oral  Daily  Nishant Dhungel, MD   1 tablet at 08/26/11 1723   .  DISCONTD: HYDROcodone-acetaminophen (NORCO) 5-325 MG per tablet 1-2 tablet  1-2 tablet  Oral  Q4H PRN  Nishant Dhungel, MD     .  DISCONTD: HYDROmorphone (DILAUDID) injection 0.25-0.5 mg  0.25-0.5 mg  Intravenous  Q5 min PRN  Raiford Simmonds, MD   0.5 mg at 08/26/11 1449   .  DISCONTD: HYDROmorphone (DILAUDID) injection 0.5 mg  0.5 mg  Intravenous  Q10 min PRN  Rivka Barbara, MD     .  DISCONTD: HYDROmorphone (DILAUDID) injection 1 mg  1 mg  Intravenous  Q1H PRN  Joya Gaskins, MD   1 mg at 08/24/11 1943   .  DISCONTD: HYDROmorphone (DILAUDID) injection 2 mg  2 mg  Intravenous  Q3H PRN  Nishant Dhungel, MD   2 mg at 08/27/11 2341   .  DISCONTD: HYDROmorphone (DILAUDID) PCA injection 0.3 mg/mL   Intravenous  Q4H  Caroline More, NP   3 mg at 08/27/11 0406   .  DISCONTD: HYDROmorphone PCA 0.3 mg/mL (DILAUDID) 0.3 mg/mL infusion         .  DISCONTD: morphine 2 MG/ML injection 2 mg  2 mg  Intravenous  Q4H PRN  Nishant Dhungel, MD     .  DISCONTD: ondansetron (ZOFRAN) injection 4 mg  4 mg  Intravenous  Q6H PRN  Raiford Simmonds, MD     .  DISCONTD: senna (SENOKOT) tablet 8.6 mg  1 tablet  Oral  BID  Nishant Dhungel, MD   8.6 mg at 08/28/11 0939   .  DISCONTD: warfarin (COUMADIN) tablet 15 mg  15 mg  Oral  ONCE-1800  Anh P Pham, PHARMD      No current outpatient prescriptions on file as of 08/30/2011.    Home:  Home Living  Lives With: Spouse;Family  Receives Help From: Family  Type of Home: House  Home Layout: Two level;Able to live on main level with bedroom/bathroom  Home Access: Ramped entrance  Home Adaptive Equipment: Wheelchair - manual;Walker - rolling;Bedside commode/3-in-1  Functional History:  Prior  Function  Level of Independence: Independent with transfers (non ambulatory)  Driving: Yes  Vocation: On disability  Functional Status:  Mobility:  Bed Mobility  Bed Mobility: Yes  Supine to Sit: 2: Max assist;Patient percentage (comment) (Pt 40%)  Supine to Sit Details (indicate cue type and reason): Manual facilitation to manage Lt LE. Verbal cues for technique including sequencing and use of Rt bedrail.  Sit to Supine: 3: Mod assist;HOB flat (Pt 50%)  Sit to Supine - Details (indicate cue type and reason): Assist to manage L LE, Verbal cues for technique.  Transfers  Transfers: No  Ambulation/Gait  Ambulation/Gait: No  Stairs: No  Wheelchair Mobility  Wheelchair Mobility: No  ADL:   Cognition:  Cognition  Arousal/Alertness: Awake/alert  Orientation Level: Oriented X4  Cognition  Arousal/Alertness: Awake/alert  Overall Cognitive Status: Appears within functional limits for tasks assessed  Orientation Level: Oriented X4  Blood pressure 82/42, pulse 75, temperature 98.6 F (37 C), temperature source Oral, resp. rate 17, height 6' (1.829 m), weight 153.2 kg (337 lb 11.9 oz), SpO2 96.00%.  Physical Exam  Constitutional: He is oriented to person, place, and time.  46 year old right-handed morbidly obese male  HENT:  Head: Normocephalic. Pupils equal round reactive to light, extraocular eye movements are intact.  Neck: Normal range of motion. Neck supple. No thyromegaly present.  Cardiovascular: Regular rhythm. Regular rate. No murmurs rubs or gallops.  Pulmonary/Chest: Breath sounds normal. He has no wheezes, rales, or rhonchi  Abdominal: He exhibits no distension. There is no tenderness. Bowel sounds are positive  Neurological: He is alert and oriented to person, place, and time.  Skin:  Hip incision clean and dry with staples intact. Area is tender to touch. He has a Mepilex dressing over the wound.  Psychiatric: He has a normal mood and affect.  upper extremities have 5/5  strength in the deltoid, biceps, triceps, grip  Right lower extremity has 3 minus over 5 strength in hip flexor, 4 minus at the knee extensors 4/5 ankle dorsiflexion and plantar flexion  Left lower extremity has 1/5 in the hip flexor knee extensor and 0/5 and ankle dorsiflexor and trace/5 in ankle plantar flexor  Sensation is reduced over the right great toe with both light touch and proprioception. The left great toe and left dorsum of the foot have absent sensation to light touch in proprioception. He also has diminished sensation over the dorsum of the foot the sole the foot and the back of the calf.  Fine motor skills are normal in the upper extremities. Cognitively he is intact.  Musculoskeletal: Patient with significant low back pain. He had tenderness with minimal manipulation of the back as well as with passive or active flexion of each hip.  Results for orders placed during the hospital encounter of 08/24/11 (from the past 48 hour(s))   BASIC METABOLIC PANEL Status: Abnormal    Collection Time    08/29/11 5:36 AM   Component  Value  Range  Comment    Sodium  136  135 - 145 (mEq/L)     Potassium  4.4  3.5 - 5.1 (mEq/L)     Chloride  99  96 - 112 (mEq/L)     CO2  26  19 - 32 (mEq/L)     Glucose, Bld  87  70 - 99 (mg/dL)     BUN  59 (*)  6 - 23 (mg/dL)     Creatinine, Ser  4.78 (*)  0.50 - 1.35 (mg/dL)     Calcium  9.0  8.4 - 10.5 (mg/dL)     GFR calc non Af Amer  11 (*)  >90 (mL/min)     GFR calc Af Amer  13 (*)  >90 (mL/min)    PROTIME-INR Status: Abnormal    Collection Time    08/29/11 5:36 AM   Component  Value  Range  Comment    Prothrombin Time  17.5 (*)  11.6 - 15.2 (seconds)     INR  1.41  0.00 - 1.49    CBC Status: Abnormal    Collection Time    08/29/11 5:36 AM   Component  Value  Range  Comment  WBC  11.8 (*)  4.0 - 10.5 (K/uL)     RBC  2.21 (*)  4.22 - 5.81 (MIL/uL)     Hemoglobin  7.2 (*)  13.0 - 17.0 (g/dL)     HCT  40.9 (*)  81.1 - 52.0 (%)     MCV  98.6  78.0 -  100.0 (fL)     MCH  32.6  26.0 - 34.0 (pg)     MCHC  33.0  30.0 - 36.0 (g/dL)     RDW  91.4 (*)  78.2 - 15.5 (%)     Platelets  188  150 - 400 (K/uL)    PREPARE RBC (CROSSMATCH) Status: Normal    Collection Time    08/29/11 4:31 PM   Component  Value  Range  Comment    Order Confirmation  ORDER PROCESSED BY BLOOD BANK     TYPE AND SCREEN Status: Normal (Preliminary result)    Collection Time    08/29/11 4:31 PM   Component  Value  Range  Comment    ABO/RH(D)  O NEG      Antibody Screen  NEG      Sample Expiration  09/01/2011      Unit Number  [id number]      Blood Component Type  RBC CPDA1, LR      Unit division  00      Status of Unit  ALLOCATED      Transfusion Status  OK TO TRANSFUSE      Crossmatch Result  Compatible      Unit Number  [id number]      Blood Component Type  RBC CPDA1, LR      Unit division  00      Status of Unit  ALLOCATED      Transfusion Status  OK TO TRANSFUSE      Crossmatch Result  Compatible     PREPARE RBC (CROSSMATCH) Status: Normal    Collection Time    08/30/11 7:49 AM   Component  Value  Range  Comment    Order Confirmation  ORDER PROCESSED BY BLOOD BANK      No results found.  Post Admission Physician Evaluation:  1. Functional deficits secondary to left intertrochanteric hip fracture and morbid obesity. 2. Patient is admitted to receive collaborative, interdisciplinary care between the physiatrist, rehab nursing staff, and therapy team. 3. Patient's level of medical complexity and substantial therapy needs in context of that medical necessity cannot be provided at a lesser intensity of care such as a SNF. 4. Patient has experienced substantial functional loss from his/her baseline which was documented above under the "Functional History" and "Functional Status" headings. Judging by the patient's diagnosis, physical exam, and functional history, the patient has potential for functional progress which will result in measurable gains while on inpatient  rehab. These gains will be of substantial and practical use upon discharge in facilitating mobility and self-care at the household level. 5. Physiatrist will provide 24 hour management of medical needs as well as oversight of the therapy plan/treatment and provide guidance as appropriate regarding the interaction of the two. 6. 24 hour rehab nursing will assist with bladder management, bowel management, safety, skin/wound care, disease management, medication administration, pain management and patient education and help integrate therapy concepts, techniques,education, etc. 7. PT will assess and treat for: Functional mobility, transfers, wheelchair manipulation, pain management. Goals are: Supervision to minimal assistance. Patient will have ambulation goals given his fracture in size. 8. OT  will assess and treat for: Upper extremity strength, functional mobility, safety, ADLs and pain control. Goals are: Supervision to moderate assistance. 9. SLP will assess and treat for: Not applicable 10. Case Management and Social Worker will assess and treat for psychological issues and discharge planning. 11. Team conference will be held weekly to assess progress toward goals and to determine barriers to discharge. 12. Patient will receive at least 3 hours of therapy per day at least 5 days per week. 13. ELOS and Prognosis: 10-14 days good Medical Problem List and Plan:  1. left intertrochanteric hip fracture after a fall. Status post ORIF with hip screw  2. DVT Prophylaxis/Anticoagulation: Chronic Coumadin for history of DVT. Monitor for any signs of bleeding  3. Pain Management: Increase OxyContin to 30 mg every 12 hours. Continue Norco as needed. Aggressively treat with muscle relaxants as well.  4. End-stage renal disease. Hemodialysis as per [facility]  5. Morbid obesity. Status post gastric sleeve procedure 2 years ago. Presently at 337 pounds  6. Depression. Lexapro and Xanax. Provide  emotional support and positive reinforcement  7. Anemia. Patient has been transfused. Continue Aranesp. Followup CBC with dialysis  8. Tobacco abuse. NicoDerm patch  9. History of VRE. Contact precautions  10. Chronic left foot drop. Plan AFO that was ordered 08/30/11. We'll discuss with therapy team. He will need a heavy-duty brace to accommodate his weight. The level and a use for this initially.  Ranelle Oyster M.D.  08/30/2011

## 2011-09-02 NOTE — Progress Notes (Signed)
ANTICOAGULATION CONSULT NOTE - Follow Up Consult  Pharmacy Consult for Coumadin Indication: Hx DVT  Allergies  Allergen Reactions  . Zosyn Hives    Patient Measurements:   Heparin Dosing Weight:  Vital Signs: Temp: 97.9 F (36.6 C) (04/05 0506) Temp src: Oral (04/05 0506) BP: 100/65 mmHg (04/05 0506) Pulse Rate: 65  (04/05 0506)  Labs:  [name] 09/02/11 0981 09/01/11 0720 08/31/11 0916 08/31/11 0605  HGB -- 7.6* 8.1* --  HCT -- 23.3* 24.5* --  PLT -- 193 168 --  APTT -- -- -- --  LABPROT 26.4* 24.1* -- 21.8*  INR 2.38* 2.12* -- 1.86*  HEPARINUNFRC -- -- -- --  CREATININE -- 5.82* -- --  CKTOTAL -- -- -- --  CKMB -- -- -- --  TROPONINI -- -- -- --   The CrCl is unknown because both a height and weight (above a minimum accepted value) are required for this calculation.  Assessment: Patient is a 46 y.o M on coumadin for Hx DVT.  Hgb decreased slightly to 7.6 today.  No bleeding noted.  INR is at goal.  Pharmacy System Based Assessment  Anticoagulation: Hx DVT, on chronic Coumadin.  I believe home dose was 13 mg daily.  INR 2.38, has had a few doses of 12 mg. Infectious Disease: Hx VRE; no abx, afebrile, WBC WNL Cardiovascular: Hx HTN; BP 100/65 Endocrinology: Hx DM; CBG 147 Gastrointestinal / Nutrition: morbid obesity (gastic bypass with gastric sleeve), hx GERD, s/p cholecystectomy Neurology: increasing oxycontin to 45 BID, adding muscle relaxants Nephrology: ESRD on HD Pulmonary: 96% on RA Hematology / Oncology: Hgb 7.6; Pltc 193 PTA Medication Issues:  Best Practices: Coumadin    Goal of Therapy:  INR 2-3   Plan:  1) repeat Coumadin 12mg  PO x1 today 2) Daily PT/INR  Levert Heslop C 09/02/2011,10:50 AM

## 2011-09-02 NOTE — Progress Notes (Signed)
Social Work Assessment and Plan Social Work Assessment and Plan  Patient Details  Name: [name] MRN: [id number] Date of Birth: 04/14/1966  Today's Date: 09/02/2011  Problem List:  Patient Active Problem List  Diagnoses  . ESRD (end stage renal disease) on dialysis  . Intertrochanteric fracture  . Hypertension  . Morbid obesity  . GERD (gastroesophageal reflux disease)  . OSA (obstructive sleep apnea)  . DVT (deep venous thrombosis)  . Hip fracture   Past Medical History:  Past Medical History  Diagnosis Date  . Hypertension   . GERD (gastroesophageal reflux disease)   . S/P laparoscopic cholecystectomy   . DVT (deep venous thrombosis)   . Renal disorder   . Complication of anesthesia     " I am hard  to wake up "  . Diabetes mellitus    Past Surgical History:  Past Surgical History  Procedure Date  . Gastric sleeve   . Cholecystectomy, laparoscopic   . Kidney stones   . Dialysis port   . Compression hip screw 08/26/2011    Procedure: COMPRESSION HIP;  Surgeon: Velna Ochs, MD;  Location: [facility] OR;  Service: Orthopedics;  Laterality: Left;  DYNAMIC HIP SCREW COMPRESSION   Social History:  reports that he has been smoking Cigarettes.  He has never used smokeless tobacco. He reports that he does not drink alcohol or use illicit drugs.  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  He can't do what the therapists ask if he is in this much pain.  MD and PA are working on this and have ordered an x-ray of his back. Recent Psychosocial Issues: Other health issues Pyschiatric History: History-depression/anxiety takes meds for and reports they help.  Pt so focused on pain could not talk about anything else.  Deferred depression screen due to pain issues, try at a later time. Substance Abuse History: Tobacco unsure if wants to quit doesn't drink ETOH  Patient / Family Perceptions, Expectations & Goals Pt/Family understanding of illness & functional limitations: Pt is able to explain his hip fracture and weight bearing issues.  He wants to do well and get out of here.  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  She states: " I want his pain controlled."  [employer]: Other (Comment) (HD-T, Thur, Sat) Premorbid Home Care/DME Agencies: None Transportation available at discharge: Family Resource referrals recommended: Support group (specify) (Already aware of Renal Support Group)  Discharge Planning Living Arrangements: Spouse/significant other;Children Support Systems: Spouse/significant other;Children;Other relatives;Friends/neighbors Type of Residence:  Private residence Insurance  Resources: Administrator (specify) Herbalist) Financial Resources: Tree surgeon (Comment) (Owns Building services engineer [employer]) Financial Screen Referred: No Living Expenses: Lives with family Money Management: Patient;Spouse Do you have any problems obtaining your medications?: No Home Management: Wife Patient/Family Preliminary Plans: Return home with wife and sister to assist.  Children can help some.  He wants to be here a very short time and go home.  Wife assisted prior to admission with bathing and dressing. Social Work Anticipated Follow Up Needs: HH/OP;Support Group  Clinical Impression Gentleman in pain and trying to do what the therapists are asking of him.  His main concern is his pain and regulating it so he can try to do therapies.  Wife concerned and wants the pain issue to be resolved also. Good family support.  Pain will be an issue throughout pt's stay.  Lucy Chris 09/02/2011, 2:25 PM

## 2011-09-02 NOTE — Progress Notes (Addendum)
[facility] Individual Statement of Services  Patient Name:  [name]  Date:  09/02/2011  Welcome to the [facility].  Our goal is to provide you with an individualized program based on your diagnosis and situation, designed to meet your specific needs.  With this comprehensive rehabilitation program, you will be expected to participate in at least 3 hours of rehabilitation therapies Monday-Friday, with modified therapy programming on the weekends.  Your rehabilitation program will include the following services:  Physical Therapy (PT), Occupational Therapy (OT), 24 hour per day rehabilitation nursing, Therapeutic Recreaction (TR), Case Management (RN and Child psychotherapist), Rehabilitation Medicine, Nutrition Services and Pharmacy Services  Weekly team conferences will be held on  Tuesday  to discuss your progress.  Your RN Case Designer, television/film set will talk with you frequently to get your input and to update you on team discussions.  Team conferences with you and your family in attendance may also be held.  Expected length of stay: 10-14 days Overall anticipated outcome: Supervision-Min Assist  Depending on your progress and recovery, your program may change.  Your RN Case Estate agent will coordinate services and will keep you informed of any changes.  Your RN Sports coach and SW names and contact numbers are listed  below.  The following services may also be recommended but are not provided by the [facility]patient [facility]:   Driving Evaluations  Home Health Rehabiltiation Services  Outpatient Rehabilitatation [facility]  Vocational Rehabilitation   Arrangements will be made to provide these services after discharge if needed.  Arrangements include referral to agencies that provide these services.  Your insurance has been verified to be:  BCBS + Medicare    Your primary doctor is:  Dr. Bufford Lope at [facility]  Pertinent information will be shared with your doctor and your insurance company.  Case Manager: Lutricia Horsfall, [facility] [phone]  Social Worker:  Dossie Der, [place] [phone]  Information discussed with and copy given to patient by: Brock Ra, 09/02/2011, 3:03 PM

## 2011-09-02 NOTE — Evaluation (Signed)
Occupational Therapy Assessment and Plan  Patient Details  Name: Chez Bulnes MRN: [id number] Date of Birth: 1965-06-03  OT Diagnosis: acute pain, lumbago (low back pain) and muscle weakness (generalized) Rehab Potential: Rehab Potential: Good ELOS:   10-14 days  Today's Date: 09/02/2011 Time: 0800-0905 Time Calculation (min): 60 min  Problem List:  Patient Active Problem List  Diagnoses  . ESRD (end stage renal disease) on dialysis  . Intertrochanteric fracture  . Hypertension  . Morbid obesity  . GERD (gastroesophageal reflux disease)  . OSA (obstructive sleep apnea)  . DVT (deep venous thrombosis)  . Hip fracture    Past Medical History:  Past Medical History  Diagnosis Date  . Hypertension   . GERD (gastroesophageal reflux disease)   . S/P laparoscopic cholecystectomy   . DVT (deep venous thrombosis)   . Renal disorder   . Complication of anesthesia     " I am hard  to wake up "  . Diabetes mellitus    Past Surgical History:  Past Surgical History  Procedure Date  . Gastric sleeve   . Cholecystectomy, laparoscopic   . Kidney stones   . Dialysis port   . Compression hip screw 08/26/2011    Procedure: COMPRESSION HIP;  Surgeon: Velna Ochs, MD;  Location: [facility] OR;  Service: Orthopedics;  Laterality: Left;  DYNAMIC HIP SCREW COMPRESSION    Assessment & Plan Clinical Impression: Patient is a 46 y.o. year old male with recent admission to the hospital on 08/24/11 with left hip fracture.  Patient transferred to CIR on 09/01/2011 .    Patient currently requires total with basic self-care skills secondary to muscle weakness.  Prior to hospitalization, patient could complete ADLS with mod assist overall with .  Patient will benefit from skilled intervention to decrease level of assist with basic self-care skills and increase independence with basic self-care skills prior to discharge home with spouse.  Anticipate patient will require 24 hour supervision and follow up  home health.  OT - End of Session Activity Tolerance: Tolerates 30+ min activity with multiple rests Endurance Deficit: No OT Assessment Rehab Potential: Good OT Plan OT Frequency: 1-2 X/day, 60-90 minutes OT Treatment/Interventions: Social worker;Discharge planning;Pain management;Therapeutic Activities;Self Care/advanced ADL retraining OT Recommendation Follow Up Recommendations: Home health OT Equipment Recommended: Other (comment) (wide drop arm commode)  OT Evaluation Precautions/Restrictions  Precautions Precautions: Fall Required Braces or Orthoses: No Other Brace/Splint: Consult for AFO for LLE but currently doesn't have one. Restrictions Weight Bearing Restrictions: Yes LLE Weight Bearing: Touchdown weight bearing Other Position/Activity Restrictions: 20 # weight bearing limit General   Vital Signs Oxygen Therapy SpO2: 96 % O2 Device: None (Room air) Pain Pain Assessment Pain Assessment: 0-10 Pain Score:   9 Pain Type: Chronic pain Pain Location: Hip Pain Orientation: Left Pain Descriptors: Aching Pain Frequency: Constant Pain Onset: On-going Pain Intervention(s): Medication (See eMAR) Multiple Pain Sites:  (chronic back pain) 2nd Pain Site Pain Score: 5 Wong-Baker Pain Rating: Hurts little more Pain Type: Chronic pain Pain Location: Back Pain Intervention(s): Medication (See eMAR) Home Living/Prior Functioning Home Living Lives With: Spouse Receives Help From: Family Type of Home: House Home Layout: Two level Alternate Level Stairs-Number of Steps: does not go to second floor Home Access: Ramped entrance Home Adaptive Equipment: Wheelchair - powered;Walker - rolling (slide board) Additional Comments: thinks he still has his PRAFO from prior admission which will help prevent loss of L ankle ROM Prior Function Level of Independence: Independent with transfers (slide  board tx, could walk up to 50 ft  with RW, used PWC mos) Able to Take Stairs?: No Driving: Yes 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 Eating: Independent Where Assessed-Eating: Bed level Grooming: Setup Where Assessed-Grooming: Bed level Upper Body Bathing: Setup Where Assessed-Upper Body Bathing: Bed level Lower Body Bathing: Maximal assistance Where Assessed-Lower Body Bathing: Bed level Upper Body Dressing: Minimal assistance Where Assessed-Upper Body Dressing: Bed level Lower Body Dressing: Dependent Where Assessed-Lower Body Dressing: Bed level Toileting: Dependent Where Assessed-Toileting: Bed level Toilet Transfer: Moderate assistance Toilet Transfer Method:  (rolling for simulated bedpan.) Tub/Shower Transfer: Not assessed Vision/Perception  Vision - History Baseline Vision: No visual deficits Vision - Assessment Eye Alignment: Within Functional Limits  Cognition Overall Cognitive Status: Appears within functional limits for tasks assessed Arousal/Alertness: Awake/alert Orientation Level: Oriented X4 Attention: Divided Sensation Sensation Light Touch: Appears Intact (In bilateral UEs) Light Touch Impaired Details: Absent LLE;Impaired RLE Stereognosis: Not tested Hot/Cold: Not tested Proprioception: Not tested Proprioception Impaired Details: Impaired LLE Additional Comments: L ankle Coordination Gross Motor Movements are Fluid and Coordinated: Yes Fine Motor Movements are Fluid and Coordinated: Yes Motor  Motor Motor: Within Functional Limits Motor - Skilled Clinical Observations: obesity limits movment options Mobility  Bed Mobility Bed Mobility: Yes Rolling Right: 3: Mod assist Rolling Left: 4: Min assist  Trunk/Postural Assessment  Cervical Assessment Cervical Assessment: Within Functional Limits Thoracic Assessment Thoracic Assessment: Within Functional Limits Lumbar Assessment Lumbar Assessment:  (limited, chronic back problems) Postural  Control Postural Control: Within Functional Limits  Balance Balance Balance Assessed: No Static Sitting Balance Static Sitting - Balance Support: Feet unsupported;No upper extremity supported Static Sitting - Level of Assistance: 7: Independent Dynamic Sitting Balance Dynamic Sitting - Level of Assistance: 6: Modified independent (Device/Increase time) Dynamic Sitting - Comments: weight shifts limited by pain in LLE Extremity/Trunk Assessment RUE Assessment RUE Assessment: Within Functional Limits LUE Assessment LUE Assessment: Within Functional Limits  See FIM for current functional status Refer to Care Plan for Long Term Goals  Recommendations for other services: None  Discharge Criteria: Patient will be discharged from OT if patient refuses treatment 3 consecutive times without medical reason, if treatment goals not met, if there is a change in medical status, if patient makes no progress towards goals or if patient is discharged from hospital.  The above assessment, treatment plan, treatment alternatives and goals were discussed and mutually agreed upon: by patient  Johnice Riebe OTR/L 09/02/2011, 12:23 PM  Pager number [phone]

## 2011-09-02 NOTE — Progress Notes (Signed)
Patient ID: [name], male   DOB: September 02, 1965, 46 y.o.   MRN: [id number] Subjective/Complaints: Review of Systems  Respiratory: Negative for cough.   Cardiovascular: Negative for chest pain.  Musculoskeletal: Positive for back pain and joint pain.  Neurological: Positive for sensory change and focal weakness.  had a long night. Back still really bothering him  Objective: Vital Signs: Blood pressure 100/65, pulse 65, temperature 97.9 F (36.6 C), temperature source Oral, resp. rate 20, SpO2 99.00%. No results found.  Basename 09/01/11 0720 08/31/11 0916  WBC 8.4 7.8  HGB 7.6* 8.1*  HCT 23.3* 24.5*  PLT 193 168    Basename 09/01/11 0720 08/30/11 1011  NA 138 134*  K 4.6 4.5  CL 101 97  CO2 27 22  GLUCOSE 79 143*  BUN 59* 77*  CREATININE 5.82* 6.83*  CALCIUM 9.0 8.9   CBG (last 3)  No results found for this basename: GLUCAP:3 in the last 72 hours  Wt Readings from Last 3 Encounters:  09/01/11 151.8 kg (334 lb 10.5 oz)  09/01/11 151.8 kg (334 lb 10.5 oz)    Physical Exam:  General appearance: alert, cooperative, moderate distress and severe distress Head: Normocephalic, without obvious abnormality, atraumatic Eyes: conjunctivae/corneas clear. PERRL, EOM's intact. Fundi benign. Ears: normal TM's and external ear canals both ears Nose: Nares normal. Septum midline. Mucosa normal. No drainage or sinus tenderness. Throat: lips, mucosa, and tongue normal; teeth and gums normal Neck: no adenopathy, no carotid bruit, no JVD, supple, symmetrical, trachea midline and thyroid not enlarged, symmetric, no tenderness/mass/nodules Back: back tender with palpation and minmal movement of legs Resp: clear to auscultation bilaterally Cardio: regular rate and rhythm, S1, S2 normal, no murmur, click, rub or gallop GI: soft, non-tender; bowel sounds normal; no masses,  no organomegaly Extremities: extremities normal, atraumatic, no cyanosis or edema Pulses: 2+ and symmetric Skin:  breakdown superificial stage 2 along lower gluteal folds Neurologic: ongoing ADF, APF weakness lle, has EHL weakness. Sensory loss in those areas also Incision/Wound: see above   Assessment/Plan: 1. Functional deficits secondary to left intertrochanteric hip fx, morbid obesity, chronic lumbar radiculopathy which require 3+ hours per day of interdisciplinary therapy in a comprehensive inpatient rehab setting. Physiatrist is providing close team supervision and 24 hour management of active medical problems listed below. Physiatrist and rehab team continue to assess barriers to discharge/monitor patient progress toward functional and medical goals. FIM:                   Comprehension Comprehension Mode: Auditory Comprehension: 6-Follows complex conversation/direction: With extra time/assistive device  Expression Expression Mode: Verbal Expression: 6-Expresses complex ideas: With extra time/assistive device  Social Interaction Social Interaction: 6-Interacts appropriately with others with medication or extra time (anti-anxiety, antidepressant).  Problem Solving Problem Solving: 6-Solves complex problems: With extra time  Memory Memory: 6-More than reasonable amt of time 1. left intertrochanteric hip fracture after a fall. Status post ORIF with hip screw  2. DVT Prophylaxis/Anticoagulation: Chronic Coumadin for history of DVT. Monitor for any signs of bleeding  3. Pain Management: Increase OxyContin to 45 mg every 12 hours. Aggressive muscle relaxants. Told the patient i would like to move awary from iv dilaudid, but will continue for now.  -check xray of lumbar spine considering "sudden" onset of pain.  -continue to work on pain coping skills 4. End-stage renal disease. Hemodialysis as per [facility]  5. Morbid obesity. Status post gastric sleeve procedure 2 years ago. Presently at 337 pounds  6.  Depression. Lexapro and Xanax. Provide emotional support and  positive reinforcement  7. Anemia. Patient has been transfused. Continue Aranesp. Followup CBC with dialysis  8. Tobacco abuse. NicoDerm patch  9. History of VRE. Contact precautions  10. Chronic left foot drop: AFO was ordered 08/30/11. We'll discuss with therapy team. He will need a heavy-duty brace to accommodate his weight. Likely a double upright brace will be needed. Will follow up with [employer] regarding order.   LOS (Days) 1 A FACE TO FACE EVALUATION WAS PERFORMED  Marylouise Mallet T 09/02/2011, 8:48 AM

## 2011-09-02 NOTE — Evaluation (Signed)
Physical Therapy Assessment and Plan  Patient Details  Name: [name] MRN: [id number] Date of Birth: [birth year]  PT Diagnosis: Difficulty walking, Low back pain, Muscle weakness and Pain in L hip Rehab Potential:   ELOS: 1-2 weeks   Today's Date: 09/02/2011 Time: 0900-1000 Time Calculation (min): 60 min  Problem List:  Patient Active Problem List  Diagnoses  . ESRD (end stage renal disease) on dialysis  . Intertrochanteric fracture  . Hypertension  . Morbid obesity  . GERD (gastroesophageal reflux disease)  . OSA (obstructive sleep apnea)  . DVT (deep venous thrombosis)  . Hip fracture    Past Medical History:  Past Medical History  Diagnosis Date  . Hypertension   . GERD (gastroesophageal reflux disease)   . S/P laparoscopic cholecystectomy   . DVT (deep venous thrombosis)   . Renal disorder   . Complication of anesthesia     " I am hard  to wake up "  . Diabetes mellitus    Past Surgical History:  Past Surgical History  Procedure Date  . Gastric sleeve   . Cholecystectomy, laparoscopic   . Kidney stones   . Dialysis port   . Compression hip screw 08/26/2011    Procedure: COMPRESSION HIP;  Surgeon: Jeremy Ochs, MD;  Location: [facility] OR;  Service: Orthopedics;  Laterality: Left;  DYNAMIC HIP SCREW COMPRESSION    Assessment & Plan Clinical Impression::   HPI: 46 year old morbidly obese white male(337lb) with end-stage renal disease with hemodialysis, DVT left lower extremity with chronic Coumadin therapy, gastric sleeve procedure 2 years ago for his obesity in [place] with prolonged intubation requiring tracheostomy since decannulated and chronic left foot drop. Of note is that he lost approximately 400 pounds after his gastric bypass surgery. At baseline he was able to ambulate 50 feet with a RW and no brace on LLE with limited endurance and used a sliding board for transfers at home and also driving. He used PWC tie down in [place] for transport and [employer]  in community.   Admitted March 27 after a fall at home (while standing and turning) sustaining a left intertrochanteric hip fracture.   His Coumadin was held until INR less than 2.0 and underwent left hip open reduction internal fixation with hip screw March 29 per Dr. Jerl Robles. Advise 20 pounds to nonweightbearing left lower extremity for 4-6 weeks. Patient has had difficulty maintaining his weightbearing status. Chronic Coumadin has been resumed for history of DVT. Patient on contact precautions for history of VRE. Dialysis as advised per [facility]. Noted anemia 7.2 April 1 and transfuse with dialysis. Patient with chronic pain presently on OxyContin 15 mg every 12 hours as he had been on 30 mg twice daily prior to admission. Physical therapy ongoing with plan to order a left AFO brace that was done 08/30/11 for chronic foot drop with recommendations for physical medicine rehabilitation consult to consider inpatient rehabilitation services  (this PT recommends he use PRAFO for ROM maintenance but not be evaluated for AFO until full WB, pt himself states this has NOT occurred in this hospital)   Patient transferred to CIR on 09/01/2011 .   Patient currently requires total of 2 with mobility secondary to muscle weakness and chronic pain and new L hip pain and decreased sitting balance, decreased standing balance and difficulty maintaining precautions.  Prior to hospitalization, patient was mod I with slide board transfers to [facility], limited ambulation with RW with history of imbalance on turns per his reports  with mobility and lived with Spouse in a House home.  Home access is  Ramped entrance. Pt wife, children and sister and coworkers to A after d/c.  Pt verbalizes understanding walking will not be a goal on CIR d/t weight bearing limitations.  Patient will benefit from skilled PT intervention to maximize safe functional mobility, minimize fall risk and decrease caregiver burden for planned  discharge home with 24 hour assist.  Anticipate patient will benefit from follow up [facility] at discharge. Pt will be PWC level.  PT - End of Session Activity Tolerance: Tolerates 30+ min activity with multiple rests Endurance Deficit:  (mostly limmited by pain) PT Plan PT Frequency: 2-3 X/day, 60-90 minutes Estimated Length of Stay: 1-2 weeks PT Treatment/Interventions: Balance/vestibular training;Functional mobility training;Therapeutic Activities;Therapeutic Exercise;Pain management;Patient/family education;Splinting/orthotics;DME/adaptive equipment instruction;UE/LE Strength taining/ROM (standing if pt can maintain WB) PT Recommendation Follow Up Recommendations: Home health PT Equipment Details: pt has new PWC at home that he is not using as it is too big and doesnt fit into his Zenaida Niece- from [employer]  PT Evaluation Precautions/Restrictions Precautions Precautions: Fall Required Braces or Orthoses: No Other Brace/Splint: Consult for AFO for LLE but currently doesn't have one. Restrictions Weight Bearing Restrictions: Yes LLE Weight Bearing: Touchdown weight bearing Other Position/Activity Restrictions: 20 # weight bearing limit   Vital Signs Oxygen Therapy SpO2: 96 % O2 Device: None (Room air) Pain Pain Assessment Pain Assessment: 0-10 Pain Score:   9 Pain Type: Chronic pain Pain Location: Hip Pain Orientation: Left Pain Descriptors: Aching Pain Frequency: Constant Pain Onset: On-going Patients Stated Pain Goal: 6 Pain Intervention(s): Medication (See eMAR) Multiple Pain Sites:  (chronic back pain) 2nd Pain Site Pain Score: 5 Wong-Baker Pain Rating: Hurts little more Pain Type: Chronic pain Pain Location: Back Pain Intervention(s): Medication (See eMAR) Home Living/Prior Functioning Home Living Lives With: Spouse Receives Help From: Family Type of Home: House Home Layout: Two level Alternate Level Stairs-Number of Steps: does not go to second floor Home Access:  Ramped entrance Home Adaptive Equipment: Wheelchair - powered;Walker - rolling (slide board) Additional Comments: thinks he still has his PRAFO from prior admission which will help prevent loss of L ankle ROM Prior Function Level of Independence: Independent with transfers (slide board tx, could walk up to 50 ft with RW, used PWC mos) Able to Take Stairs?: No Driving: Yes 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[employer] Vision/Perception  Vision - History Baseline Vision: No visual deficits  Cognition Overall Cognitive Status: Appears within functional limits for tasks assessed Sensation Sensation Light Touch: Impaired Detail Light Touch Impaired Details: Absent LLE;Impaired RLE Proprioception: Impaired Detail Proprioception Impaired Details: Impaired LLE Additional Comments: L ankle Motor  Motor Motor - Skilled Clinical Observations: obesity limits movment options     Trunk/Postural Assessment  Cervical Assessment Cervical Assessment: Within Functional Limits Thoracic Assessment Thoracic Assessment: Within Functional Limits Lumbar Assessment Lumbar Assessment:  (limited, chronic back problems)  Balance Static Sitting Balance Static Sitting - Balance Support: Feet unsupported;No upper extremity supported Static Sitting - Level of Assistance: 7: Independent Dynamic Sitting Balance Dynamic Sitting - Level of Assistance: 6: Modified independent (Device/Increase time) Dynamic Sitting - Comments: weight shifts limited by pain in LLE Extremity Assessment      RLE Assessment RLE Assessment: Within Functional Limits LLE Strength LLE Overall Strength: Deficits;Due to pain;Other (Comment) (pain in hip, alos 0/5 in ankle premorbid) LLE Overall Strength Comments: strength grossly 2-/5 in hip and knee  See FIM for current functional status Refer to Care Plan for  Long Term Goals  Recommendations for other services: None  Discharge Criteria: Patient will be discharged from  PT if patient refuses treatment 3 consecutive times without medical reason, if treatment goals not met, if there is a change in medical status, if patient makes no progress towards goals or if patient is discharged from hospital.  The above assessment, treatment plan, treatment alternatives and goals were discussed and mutually agreed upon: by patient  Skilled intervention began- pt not wanting to even sit EOB until IV pain meds given, performed glut and quad sets while waiting, plus passive stretch to heel cords, pt reported tightness in L a problem in standing premorbid. Asked his to bring in his PRAFO to wear while in bed while here.  Discussed overhead trapeze trial with MD as bed mobility very difficult d/t pt size and his pain and weakness.  Pt very limited by pain and all mobility required excessive time to complete. Max A supine to sit to L (which is how pt gets OOB at home but he does have a bed where head can raise that may need to be tried) and total A +2 slide borad level transfer bed to PWC to R (as pt does at home) with significant difficulty moving d/t pt size, pain, length of limbs.  Pt = 30% and required cues and encouragment, he did not want to lean forward d/t pain which increased difficulty of transfer. Pt modI once in his PWC on unit.  Michaelene Song 09/02/2011, 10:22 AM

## 2011-09-02 NOTE — Progress Notes (Signed)
Occupational Therapy Session Note  Patient Details  Name: [name] MRN: [id number] Date of Birth: 05/25/1966  Today's Date: 09/02/2011 Time: 4098-1191 Time Calculation (min): 48 min  Short Term Goals: Week 1:  OT Short Term Goal 1 (Week 1): Pt will perfrom UB bathing and dressing sitting edge of bed unsupported with setup only. OT Short Term Goal 2 (Week 1): Pt will donn gripper socks and pants over feet in sitting with supervision using reacher. OT Short Term Goal 3 (Week 1): Pt will transfer supine to sit EOB with mod assist in preparation for selfcare tasks. OT Short Term Goal 4 (Week 1): Pt will perform bilateral UE therex to increase endurance and strength for sit to stand with independence following handout. OT Short Term Goal 5 (Week 1): Pt will perform sit to stand with total +2 pt 50% to assist with pulling pants over hips or peri hygeine.  Skilled Therapeutic Interventions/Progress Updates:  Worked on transitioning supine to sit EOB with max assist.  Once EOB pt practice utilizing reacher and sock aide for donning and doffing socks.  Pt able to perform with min assist and mod instructional cues.  At end of session pt requested to return to supine in preparation for them transporting him to x-ray.  Required max assist to transition back to supine.   Therapy Documentation Precautions:  Precautions Precautions: Fall Required Braces or Orthoses: No Other Brace/Splint: Consult for AFO for LLE but currently doesn't have one. Restrictions Weight Bearing Restrictions: Yes LLE Weight Bearing: Touchdown weight bearing Other Position/Activity Restrictions: 20 # weight bearing limit  Pain: Pain Assessment Pain Assessment: 0-10 Pain Score:   9 Pain Type: Chronic pain Pain Location: Hip Pain Orientation: Left Pain Descriptors: Aching Pain Frequency: Constant Pain Onset: On-going Pain Intervention(s): Medication (See eMAR) Multiple Pain Sites: Yes 2nd Pain Site Pain Score:  5 Pain Type: Chronic pain Pain Location: Back Pain Intervention(s): Medication (See eMAR) ADL: See FIM for current functional status  Therapy/Group: Individual Therapy  Iyad Deroo OTR/L 09/02/2011, 4:03 PM

## 2011-09-02 NOTE — Progress Notes (Signed)
Orthopedic Tech Progress Note Patient Details:  [name] 05/06/1966 [id number]  Patient ID: Lamona Curl, male   DOB: Nov 04, 1965, 46 y.o.   MRN: [id number] Patient bed will not accept OHF. Pt nurse has been notified.  Jese Comella T 09/02/2011, 1:45 PM

## 2011-09-02 NOTE — Plan of Care (Signed)
Overall Plan of Care [facility]) Patient Details Name: [name] MRN: [id number] DOB: 04/03/1966  Diagnosis:  Left intertrochanteric hip fx Primary Diagnosis:    <principal problem not specified> Co-morbidities: morbid obesity, chronic renal failure  Functional Problem List  Patient demonstrates impairments in the following areas: Endurance, Motor, Pain, Sensory  and Skin Integrity  Basic ADL's: grooming, bathing, dressing and toileting Advanced ADL's:   Transfers:  bed mobility and bed to chair Locomotion:  ambulation  Additional Impairments:  Leisure Awareness  Anticipated Outcomes Item Anticipated Outcome  Eating/Swallowing    Basic self-care  Min assist  Tolieting  Min assist  Bowel/Bladder  Anuric, continent of bowel  Transfers    Locomotion    Communication    Cognition    Pain  </=6  Safety/Judgment    Other     Therapy Plan:  1-2 times per day for 60 to 90 mins 5-7 days/wk       Team Interventions: Item RN PT OT SLP SW TR Other  Self Care/Advanced ADL Retraining   x      Neuromuscular Re-Education         Therapeutic Activities  x x      UE/LE Strength Training/ROM  x x      UE/LE Coordination Activities   x      Visual/Perceptual Remediation/Compensation         DME/Adaptive Equipment Instruction  x x      Therapeutic Exercise  x x      Balance/Vestibular Training  x x      Patient/Family Education x x x      Cognitive Remediation/Compensation         Functional Mobility Training  x x      Ambulation/Gait Dentist         Bladder Management x        Bowel Management x        Disease Management/Prevention x        Pain Management x x       Medication Management x        Skin Care/Wound Management x          Splinting/Orthotics  x       Discharge Planning x    x    Psychosocial Support x    x                       Team Discharge Planning: Destination:  Home Projected Follow-up:  PT and Home Health OT home health Projected Equipment Needs:  Wide bariatric drop arm commode Patient/family involved in discharge planning:  Yes  MD ELOS: 1-2 weeks Medical Rehab Prognosis:  Good Assessment: Pt is admitted for CIR therapies. Goals are at a wheelchair level given his size, wb precautions, and pain issues.  Goals are primarily minimal assistance.  Pain is main inhibiting factor at this point, especially his low back pain.Marland Kitchen

## 2011-09-02 NOTE — Progress Notes (Signed)
Patient information reviewed and entered into [facility] system by Analeise Mccleery, RN, CRRN, PPS Coordinator.  Information including medical coding and functional independence measure will be reviewed and updated through discharge.     Per nursing patient was given "Data Collection Information Summary for Patients in Inpatient Rehabilitation Facilities with attached "Privacy Act Statement-Health Care Records" upon admission.   

## 2011-09-02 NOTE — Progress Notes (Signed)
Physical Therapy Session Note  Patient Details  Name: [name] MRN: [id number] Date of Birth: February 26, 1966  Today's Date: 09/02/2011 Time: 1400-1430 Time Calculation (min): 30 min  Short Term Goals: Week 1:  PT Short Term Goal 1 (Week 1): Pt will transfer supine to sit with mod A in preparation for OOB transfer PT Short Term Goal 2 (Week 1): Pt will perform LLE therex with mod A A per written HEP to decrease pain and increase strenght for mobility PT Short Term Goal 3 (Week 1): Pt will transfer level surfaces with slide board mod A  Skilled Therapeutic Interventions/Progress Updates:    Nurse requested pt stay in bed d/t pending spinal xray, reports it took 4 people to assist him back to bed with slide board. Performed there-ex with LLE, up to max A for a/arom, 10 x 2 of each exercise, used sheet for pt to assist himself with second set of heel slides. Pt given written HEP and encouraged to perform the ones he could on his own over the weekend.  Therapy Documentation Precautions:  Precautions Precautions: Fall Required Braces or Orthoses: No Other Brace/Splint: Consult for AFO for LLE but currently doesn't have one. Restrictions Weight Bearing Restrictions: Yes LLE Weight Bearing: Touchdown weight bearing Other Position/Activity Restrictions: 20 # weight bearing limit Pain: c/o in LLE with movement but premeidcated and able to participate with more active movment than this am  Exercises: Total Joint Exercises Quad Sets: 10 reps;Both Gluteal Sets: 10 reps Short Arc Quad: AAROM;Strengthening;Left;10 reps Heel Slides: Strengthening;AAROM;10 reps;Supine;Left Hip ABduction/ADduction: 10 reps;AAROM;Strengthening;Left Other Exercises Other Exercises: passive L heel cord stretch :    See FIM for current functional status  Therapy/Group: Individual Therapy  Michaelene Song 09/02/2011, 2:20 PM

## 2011-09-03 ENCOUNTER — Inpatient Hospital Stay (HOSPITAL_COMMUNITY): Payer: BC Managed Care – PPO

## 2011-09-03 LAB — CBC
HCT: 23.5 % — ABNORMAL LOW (ref 39.0–52.0)
Hemoglobin: 7.6 g/dL — ABNORMAL LOW (ref 13.0–17.0)
MCV: 96.3 fL (ref 78.0–100.0)
RBC: 2.44 MIL/uL — ABNORMAL LOW (ref 4.22–5.81)
RDW: 16.6 % — ABNORMAL HIGH (ref 11.5–15.5)
WBC: 8.9 10*3/uL (ref 4.0–10.5)

## 2011-09-03 LAB — RENAL FUNCTION PANEL
Albumin: 2.4 g/dL — ABNORMAL LOW (ref 3.5–5.2)
BUN: 57 mg/dL — ABNORMAL HIGH (ref 6–23)
CO2: 25 mEq/L (ref 19–32)
Chloride: 95 mEq/L — ABNORMAL LOW (ref 96–112)
Creatinine, Ser: 5.86 mg/dL — ABNORMAL HIGH (ref 0.50–1.35)
Potassium: 4.3 mEq/L (ref 3.5–5.1)

## 2011-09-03 LAB — PROTIME-INR: Prothrombin Time: 32.3 seconds — ABNORMAL HIGH (ref 11.6–15.2)

## 2011-09-03 MED ORDER — HYDROMORPHONE HCL PF 1 MG/ML IJ SOLN
INTRAMUSCULAR | Status: AC
  Administered 2011-09-03: 2 mg
  Filled 2011-09-03: qty 2

## 2011-09-03 NOTE — Progress Notes (Signed)
Dialysis treatment in progress. Hemodynamically tolerated. 3 liter goal today.  No dialysis related complications. AVF left arm functioning well. Will re check hemoglobin since last one was 7.6g Assessment/ Plan:  Pt is a 46 y.o. yo male ESRD who was admitted on 08/24/2011 with Hip fracture  Assessment/Plan:  1. Hip fracture- s/p ORIF last Friday.  2. ESRD- TTS via AVF-  3. Anemia- s/p RBCs  4. Secondary hyperparathyroidism- on renvela, not on any vitamin D as OP  5. HTN/volume- BP low after wgt down with HD  [name]

## 2011-09-03 NOTE — Progress Notes (Signed)
Physical Therapy Note  Patient Details  Name: Teigan Sahli MRN: [id number] Date of Birth: Sep 14, 1965 Today's Date: 09/03/2011  1515-1545 (30 minutes) individual (missed 15 minutes secondary to pain) Pain: 8/10 low back/Lt. LE with movement /premedicated Focus of treatment: bed mobility training; LT LE AROM/strengthening Treatment: Pt had just returned to bed secondary to pain as above. Agreed to attempt to perform supine to sit and sit edge of bed. Supine to sit assist for LT LE and trunk (mod assist) with difficulty. LAQs X 10 at edge of bed./sit to supine Assist LT LE , pt used headboard to self assist up in bed.; AAROM left hip, knee flexion 2 X 10 .   Strider Vallance,JIM 09/03/2011, 3:49 PM

## 2011-09-03 NOTE — Progress Notes (Signed)
Pt requested IV dilaudid at beginning of shift. Upon trying to administer med to pt, noted there was not an active order for IV, but PO only. Pt became angry and stated MD did mention stopping IV dilaudid but agreed to keep it on board for a little longer. Checked MD's progress note for today and pt was correct, MD did document intention to keep IV dilaudid available for now but possibly order was not placed. Paged MD on call (Dr. Felicity Coyer) and explained situation to her, she agreed to re-order dilaudid IV 4mg  q4h prn for now. Pt has received doses at 2004 and 0015, has rated pain 8/10 in the L hip/leg each time, although he does not appear to be in severe pain. Also requested and received Norco 10/325 2 tabs and Xanax 1mg  at 2152. Discussed with pt that we will have to eventually wean off IV dilaudid because he cannot go home on this med, will need continued education and reinforcement regarding pain management plan. Continue with plan of care.

## 2011-09-03 NOTE — Progress Notes (Signed)
Patient ID: [name], male   DOB: [birth year], 46 y.o.   MRN: [id number] Patient ID: [name], male   DOB: 29-Oct-1965, 46 y.o.   MRN: [id number] Subjective/Complaints: Review of Systems  Respiratory: Negative for cough.   Cardiovascular: Negative for chest pain.  Musculoskeletal: Positive for back pain and joint pain.  Neurological: Positive for sensory change and focal weakness.  had a long night. Back still really bothering him  Objective: Vital Signs: Blood pressure 104/64, pulse 80, temperature 98.2 F (36.8 C), temperature source Oral, resp. rate 20, height 6' (1.829 m), weight 334 lb 10.5 oz (151.799 kg), SpO2 97.00%. Dg Lumbar Spine 1 View  09/02/2011  *RADIOLOGY REPORT*  Clinical Data: Increased low back pain, history of chronic pain  LUMBAR SPINE - 1 VIEW  Comparison: None  Findings: IVC filter. Diffuse osseous demineralization. Six non-rib bearing lumbar-type segments. Scattered disc space narrowing. No gross evidence of fracture or bone destruction on AP only exam. SI joints incompletely profiled due to slight rotation. Visualized bowel gas pattern normal.  IMPRESSION: Osseous demineralization. Mild scattered degenerative disc disease changes.  Original Report Authenticated By: Lollie Marrow, M.D.    Basename 09/01/11 0720  WBC 8.4  HGB 7.6*  HCT 23.3*  PLT 193    Basename 09/01/11 0720  NA 138  K 4.6  CL 101  CO2 27  GLUCOSE 79  BUN 59*  CREATININE 5.82*  CALCIUM 9.0   CBG (last 3)  No results found for this basename: GLUCAP:3 in the last 72 hours  Wt Readings from Last 3 Encounters:  09/02/11 334 lb 10.5 oz (151.799 kg)  09/01/11 334 lb 10.5 oz (151.8 kg)  09/01/11 334 lb 10.5 oz (151.8 kg)    Physical Exam:  General appearance: alert, cooperative, moderate distress and severe distress Head: Normocephalic, without obvious abnormality, atraumatic Eyes: conjunctivae/corneas clear. PERRL, EOM's intact. Fundi benign. Ears: normal TM's and external ear  canals both ears Nose: Nares normal. Septum midline. Mucosa normal. No drainage or sinus tenderness. Throat: lips, mucosa, and tongue normal; teeth and gums normal Neck: no adenopathy, no carotid bruit, no JVD, supple, symmetrical, trachea midline and thyroid not enlarged, symmetric, no tenderness/mass/nodules Back: back tender with palpation and minmal movement of legs Resp: clear to auscultation bilaterally Cardio: regular rate and rhythm, S1, S2 normal, no murmur, click, rub or gallop GI: soft, non-tender; bowel sounds normal; no masses,  no organomegaly Extremities: extremities normal, atraumatic, no cyanosis or edema Pulses: 2+ and symmetric Skin: breakdown superificial stage 2 along lower gluteal folds Neurologic: ongoing ADF, APF weakness lle, has EHL weakness. Sensory loss in those areas also Incision/Wound: see above   Assessment/Plan: 1. Functional deficits secondary to left intertrochanteric hip fx, morbid obesity, chronic lumbar radiculopathy which require 3+ hours per day of interdisciplinary therapy in a comprehensive inpatient rehab setting. Physiatrist is providing close team supervision and 24 hour management of active medical problems listed below. Physiatrist and rehab team continue to assess barriers to discharge/monitor patient progress toward functional and medical goals. FIM:       FIM - Toileting Toileting: 7: Independent: No helper, no device     FIM - Banker Devices: Sliding board;Arm rests;Bed rails Bed/Chair Transfer: 2: Supine > Sit: Max A (lifting assist/Pt. 25-49%);1: Two helpers  FIM - Locomotion: Wheelchair Locomotion: Wheelchair: 6: Travels 150 ft or more, turns around, maneuvers to table, bed or toilet, negotiates 3% grade: maneuvers on rugs and over door sills independently FIM - Locomotion: Ambulation  Locomotion: Ambulation: 0: Activity did not occur (unsafe to attempt)  Comprehension Comprehension  Mode: Auditory Comprehension: 6-Follows complex conversation/direction: With extra time/assistive device  Expression Expression Mode: Verbal Expression: 6-Expresses complex ideas: With extra time/assistive device  Social Interaction Social Interaction: 5-Interacts appropriately 90% of the time - Needs monitoring or encouragement for participation or interaction.  Problem Solving Problem Solving: 6-Solves complex problems: With extra time  Memory Memory: 6-More than reasonable amt of time 1. left intertrochanteric hip fracture after a fall. Status post ORIF with hip screw  2. DVT Prophylaxis/Anticoagulation: Chronic Coumadin for history of DVT. Monitor for any signs of bleeding  3. Pain Management: Increase OxyContin to 45 mg every 12 hours. Aggressive muscle relaxants. Told the patient i would like to move awary from iv dilaudid, but will continue for now.  -check xray of lumbar spine considering "sudden" onset of pain.  -continue to work on pain coping skills 4. End-stage renal disease. Hemodialysis as per [facility]  5. Morbid obesity. Status post gastric sleeve procedure 2 years ago. Presently at 337 pounds  6. Depression. Lexapro and Xanax. Provide emotional support and positive reinforcement  7. Anemia. Patient has been transfused. Continue Aranesp. Followup CBC with dialysis  8. Tobacco abuse. NicoDerm patch  9. History of VRE. Contact precautions  10. Chronic left foot drop: AFO was ordered 08/30/11. We'll discuss with therapy team. He will need a heavy-duty brace to accommodate his weight. Likely a double upright brace will be needed. Will follow up with [employer] regarding order.   LOS (Days) 2 A FACE TO FACE EVALUATION WAS PERFORMED  [name] 09/03/2011, 9:45 AM

## 2011-09-03 NOTE — Progress Notes (Signed)
Occupational Therapy Session Note  Patient Details  Name: [name] MRN: [id number] Date of Birth: [birth year]  Session 1  Today's Date: 09/03/2011 Time: 1000-1100 Time Calculation (min): 60 min  Short Term Goals: Week 1:  OT Short Term Goal 1 (Week 1): Pt will perfrom UB bathing and dressing sitting edge of bed unsupported with setup only. OT Short Term Goal 2 (Week 1): Pt will donn gripper socks and pants over feet in sitting with supervision using reacher. OT Short Term Goal 3 (Week 1): Pt will transfer supine to sit EOB with mod assist in preparation for selfcare tasks. OT Short Term Goal 4 (Week 1): Pt will perform bilateral UE therex to increase endurance and strength for sit to stand with independence following handout. OT Short Term Goal 5 (Week 1): Pt will perform sit to stand with total +2 pt 50% to assist with pulling pants over hips or peri hygeine.  Skilled Therapeutic Interventions/Progress Updates: ADL-retraining in bed.  Patient requested assisted bed bath this date due to pain at left hip and report that he bathes in bed at home with wife's assist for lower legs as his normal routine.  Patient states he is familiar with use of assistive devices but prefers not to attempt greater independence due to expedience of bed baths and preference for therapy to address improving general mobility.   Patient is able to describe and direct, in great detail, his method for assisted bed baths using two wash basin with supplies arranged on his overbed table.  Patient completed upper body bathing, grooming, and peri-care with only setup assist but required assist for lower legs and buttock.   He elected to not wear clothing after bathing which consumed the entire treatment time.  Patient states he has standard bed at home and several electric mobility devices to allow direct access in/out of home and in/out of his adapted Zenaida Niece (2002 [employer] with ramp and tie-downs at driver's  side).  Therapy Documentation Precautions:  Precautions Precautions: Fall Required Braces or Orthoses: No Other Brace/Splint: PRAFO in room this date Restrictions Weight Bearing Restrictions: Yes LLE Weight Bearing: Touchdown weight bearing Other Position/Activity Restrictions: 20 # weight bearing limit  Vital Signs: Therapy Vitals Temp: 97.7 F (36.5 C) Temp src: Oral Pulse Rate: 78  Resp: 18  BP: 93/35 mmHg Patient Position, if appropriate: Sitting Oxygen Therapy SpO2: 93 % O2 Device: None (Room air)  Pain: 7/10, managed with meds and positioning assist.  ADL: ADL Eating: Independent Where Assessed-Eating: Bed level Grooming: Setup Where Assessed-Grooming: Bed level Upper Body Bathing: Setup Where Assessed-Upper Body Bathing: Bed level Lower Body Bathing: Maximal assistance Where Assessed-Lower Body Bathing: Bed level Upper Body Dressing: Minimal assistance Where Assessed-Upper Body Dressing: Bed level Lower Body Dressing: Dependent Where Assessed-Lower Body Dressing: Bed level  See FIM for current functional status  Therapy: Individual Therapy  Session 2  Today's Date: 09/03/2011 Time: 1430-1500 Time Calculation (min): 30 min   Skilled Therapeutic Interventions: ADL-retraining with emphasis on bed mobility, static/dynamic sitting balance, pain management, safety, and UE strengthening.   Patient was fully dressed but had not left bed this date prior to treatment.  OT re-educated patient on goals to improve functional mobility, improve activity tolerance and manage pain through correct positioning of left lower leg.   Patient was assisted to supported sitting at edge of bed and he attempted sit-stand after repeated instruction to partially extend left LE and bear weight through right leg and arms using walker.  Patient was  able to partially stand while observing hip precaution, raising his buttocks off bed, but was unable to fully extend right knee or hip.    Patient required max assist to move his left leg both in and out of bed but he managed bed mobility with only min assist.    Therapy:Individual Therapy   Georgeanne Nim 09/03/2011, 4:04 PM

## 2011-09-03 NOTE — Progress Notes (Signed)
Physical Therapy Note  Patient Details  Name: [name] MRN: [id number] Date of Birth: 09/06/1965 Today's Date: 09/03/2011  4098-1191 (55 minutes) individual Pain: 10/10 LT hip/low back/nurse notified /IV pain meds given Focus of treatment: Bilateral LE AROM/strengthening as tolerated Treatment: Reviewed LTGs for rehab stay (pt could not recall ); X 20 heel slides (AA on left); hip abduction (AA on left); ankle pumps (passive stretch left); SAQs (2 X 20) , quad sets. Pt used sheet to assist with heel slides and hip abduction on left) . Pt awaiting bathing and dressing in bed (next session).   Mylo Choi,JIM 09/03/2011, 9:50 AM

## 2011-09-03 NOTE — Progress Notes (Signed)
ANTICOAGULATION CONSULT NOTE - Follow Up Consult  Pharmacy Consult for Coumadin Indication: Hx DVT  Allergies  Allergen Reactions  . Zosyn Hives    Patient Measurements: Height: 6' (182.9 cm) Weight: 334 lb 10.5 oz (151.799 kg) IBW/kg (Calculated) : 77.6  Heparin Dosing Weight:  Vital Signs: Temp: 98.2 F (36.8 C) (04/06 0500) Temp src: Oral (04/06 0500) BP: 104/64 mmHg (04/06 0500) Pulse Rate: 80  (04/06 0500)  Labs:  Basename 09/03/11 0630 09/02/11 0625 09/01/11 0720  HGB -- -- 7.6*  HCT -- -- 23.3*  PLT -- -- 193  APTT -- -- --  LABPROT 32.3* 26.4* 24.1*  INR 3.08* 2.38* 2.12*  HEPARINUNFRC -- -- --  CREATININE -- -- 5.82*  CKTOTAL -- -- --  CKMB -- -- --  TROPONINI -- -- --   Estimated Creatinine Clearance: 24.8 ml/min (by C-G formula based on Cr of 5.82).  Assessment: Patient is a 46 y.o M on coumadin for Hx DVT.  INR up to 3.08 today.  Pharmacy System Based Assessment  Anticoagulation: Hx DVT, on chronic Coumadin.  I believe home dose was 13 mg daily.  INR is up to 3.08 today - a significant increase from yesterday.   Infectious Disease: Hx VRE; no abx, afebrile, WBC WNL Cardiovascular: Hx HTN; VSS Endocrinology: Hx DM; glucose 79 this AM Gastrointestinal / Nutrition: morbid obesity (gastic bypass with gastric sleeve), hx GERD, s/p cholecystectomy Neurology: increasing oxycontin to 45 BID, adding muscle relaxants Nephrology: ESRD on HD Pulmonary: 96% on RA Hematology / Oncology: Hgb 7.6; Pltc 193 PTA Medication Issues:  Best Practices: Coumadin  Goal of Therapy:  INR 2-3   Plan:  1) No Coumadin today. 2) Daily PT/INR  Estella Husk, Pharm.D., BCPS Clinical Pharmacist  Pager [phone] 09/03/2011, 12:50 PM

## 2011-09-04 LAB — HEPATITIS B SURFACE ANTIBODY,QUALITATIVE: Hep B S Ab: NEGATIVE

## 2011-09-04 LAB — PROTIME-INR: INR: 2.96 — ABNORMAL HIGH (ref 0.00–1.49)

## 2011-09-04 MED ORDER — WARFARIN SODIUM 10 MG PO TABS
10.0000 mg | ORAL_TABLET | Freq: Once | ORAL | Status: AC
  Administered 2011-09-04: 10 mg via ORAL
  Filled 2011-09-04: qty 1

## 2011-09-04 NOTE — Progress Notes (Signed)
ANTICOAGULATION CONSULT NOTE - Follow Up Consult  Pharmacy Consult for Coumadin Indication: Hx DVT  Allergies  Allergen Reactions  . Zosyn Hives    Patient Measurements: Height: 6' (182.9 cm) Weight: 368 lb 6.2 oz (167.1 kg) IBW/kg (Calculated) : 77.6  Heparin Dosing Weight:  Vital Signs: Temp: 97.7 F (36.5 C) (04/07 0630) Temp src: Oral (04/07 0630) BP: 104/60 mmHg (04/07 1035) Pulse Rate: 60  (04/07 1030)  Labs:  Basename 09/04/11 0555 09/03/11 1842 09/03/11 0630 09/02/11 0625  HGB -- 7.6* -- --  HCT -- 23.5* -- --  PLT -- 233 -- --  APTT -- -- -- --  LABPROT 31.3* -- 32.3* 26.4*  INR 2.96* -- 3.08* 2.38*  HEPARINUNFRC -- -- -- --  CREATININE -- 5.86* -- --  CKTOTAL -- -- -- --  CKMB -- -- -- --  TROPONINI -- -- -- --   Estimated Creatinine Clearance: 26.1 ml/min (by C-G formula based on Cr of 5.86).  Assessment: Patient is a 46 y.o M on coumadin for Hx DVT.  INR is down to 2.9 today after holding yesterday's Coumadin dose.  Goal of Therapy:  INR 2-3   Plan:  1) Try Coumadin 10mg  today 2) Daily PT/INR monitoring until stable.  Estella Husk, Pharm.D., BCPS Clinical Pharmacist  Pager [phone] 09/04/2011, 11:03 AM

## 2011-09-04 NOTE — Progress Notes (Signed)
Physical Therapy Note  Patient Details  Name: Covey Baller MRN: [id number] Date of Birth: June 21, 1965 Today's Date: 09/04/2011  1030-1110 (40 minutes) individual Pain: 8/10 low back/ Lt. LE / pain meds administered Focus of treatment: bilateral LE strengthening/ AROM LT LE Treatment: AA ROM LT hip flexion 2 X 10; SAQs X 10 bilatrally, passive stretch bilateral heel cords; supine to sit mod assist ; transfer bed to wc with sliding board assist + 2 for safety ( assist for board placement, min assist to slide and second person to manage LT LE with transfer to right.Pt up in wc.    Nakeeta Sebastiani,JIM 09/04/2011, 11:12 AM

## 2011-09-04 NOTE — Progress Notes (Signed)
Patient ID: [name], male   DOB: 08-08-1965, 46 y.o.   MRN: [id number] Patient ID: [name], male   DOB: 09/15/1965, 46 y.o.   MRN: [id number] Patient ID: [name], male   DOB: 1965-12-24, 46 y.o.   MRN: [id number] Subjective/Complaints: Review of Systems  Respiratory: Negative for cough.   Cardiovascular: Negative for chest pain.  Musculoskeletal: Positive for back pain and joint pain.  Neurological: Positive for sensory change and focal weakness.  C/o fatigue. Back still really bothering him  Objective: Vital Signs: Blood pressure 99/60, pulse 65, temperature 97.7 F (36.5 C), temperature source Oral, resp. rate 16, height 6' (1.829 m), weight 368 lb 6.2 oz (167.1 kg), SpO2 90.00%. Dg Lumbar Spine 1 View  09/02/2011  *RADIOLOGY REPORT*  Clinical Data: Increased low back pain, history of chronic pain  LUMBAR SPINE - 1 VIEW  Comparison: None  Findings: IVC filter. Diffuse osseous demineralization. Six non-rib bearing lumbar-type segments. Scattered disc space narrowing. No gross evidence of fracture or bone destruction on AP only exam. SI joints incompletely profiled due to slight rotation. Visualized bowel gas pattern normal.  IMPRESSION: Osseous demineralization. Mild scattered degenerative disc disease changes.  Original Report Authenticated By: Lollie Marrow, M.D.    Basename 09/03/11 1842  WBC 8.9  HGB 7.6*  HCT 23.5*  PLT 233    Basename 09/03/11 1842  NA 134*  K 4.3  CL 95*  CO2 25  GLUCOSE 95  BUN 57*  CREATININE 5.86*  CALCIUM 8.9   CBG (last 3)  No results found for this basename: GLUCAP:3 in the last 72 hours  Wt Readings from Last 3 Encounters:  09/04/11 368 lb 6.2 oz (167.1 kg)  09/01/11 334 lb 10.5 oz (151.8 kg)  09/01/11 334 lb 10.5 oz (151.8 kg)    Physical Exam:  General appearance: alert, cooperative, moderate distress and severe distress Head: Normocephalic, without obvious abnormality, atraumatic Eyes: conjunctivae/corneas clear. PERRL,  EOM's intact. Fundi benign. Ears: normal TM's and external ear canals both ears Nose: Nares normal. Septum midline. Mucosa normal. No drainage or sinus tenderness. Throat: lips, mucosa, and tongue normal; teeth and gums normal Neck: no adenopathy, no carotid bruit, no JVD, supple, symmetrical, trachea midline and thyroid not enlarged, symmetric, no tenderness/mass/nodules Back: back tender with palpation and minmal movement of legs Resp: clear to auscultation bilaterally Cardio: regular rate and rhythm, S1, S2 normal, no murmur, click, rub or gallop GI: soft, non-tender; bowel sounds normal; no masses,  no organomegaly Extremities: extremities normal, atraumatic, no cyanosis or edema Pulses: 2+ and symmetric Skin: breakdown superificial stage 2 along lower gluteal folds Neurologic: ongoing ADF, APF weakness lle, has EHL weakness. Sensory loss in those areas also Incision/Wound: see above   Assessment/Plan: 1. Functional deficits secondary to left intertrochanteric hip fx, morbid obesity, chronic lumbar radiculopathy which require 3+ hours per day of interdisciplinary therapy in a comprehensive inpatient rehab setting. Physiatrist is providing close team supervision and 24 hour management of active medical problems listed below. Physiatrist and rehab team continue to assess barriers to discharge/monitor patient progress toward functional and medical goals. FIM: FIM - Bathing Bathing Steps Patient Completed: Chest;Right Arm;Left Arm;Abdomen;Front perineal area;Right upper leg;Left upper leg Bathing: 3: Mod-Patient completes 5-7 70f 10 parts or 50-74%  FIM - Upper Body Dressing/Undressing Upper body dressing/undressing: 0: Wears gown/pajamas-no public clothing FIM - Lower Body Dressing/Undressing Lower body dressing/undressing: 0: Activity did not occur  FIM - Toileting Toileting: 0: Activity did not occur     FIM -  Bed/Chair Transport planner Devices: Sliding  board;Arm rests;Bed rails Bed/Chair Transfer: 2: Supine > Sit: Max A (lifting assist/Pt. 25-49%);1: Two helpers  FIM - Locomotion: Printmaker: Wheelchair: 6: Travels 150 ft or more, turns around, maneuvers to table, bed or toilet, negotiates 3% grade: maneuvers on rugs and over door sills independently FIM - Locomotion: Ambulation Locomotion: Ambulation: 0: Activity did not occur (unsafe to attempt)  Comprehension Comprehension Mode: Auditory Comprehension: 7-Follows complex conversation/direction: With no assist  Expression Expression Mode: Verbal Expression: 7-Expresses complex ideas: With no assist  Social Interaction Social Interaction: 5-Interacts appropriately 90% of the time - Needs monitoring or encouragement for participation or interaction.  Problem Solving Problem Solving: 7-Solves complex problems: Recognizes & self-corrects  Memory Memory: 7-Complete Independence: No helper 1. left intertrochanteric hip fracture after a fall. Status post ORIF with hip screw  2. DVT Prophylaxis/Anticoagulation: Chronic Coumadin for history of DVT. Monitor for any signs of bleeding  3. Pain Management: Increase OxyContin to 45 mg every 12 hours. Aggressive muscle relaxants. Told the patient i would like to move awary from iv dilaudid, but will continue for now.  -check xray of lumbar spine considering "sudden" onset of pain.  -continue to work on pain coping skills 4. End-stage renal disease. Hemodialysis as per [facility]  5. Morbid obesity. Status post gastric sleeve procedure 2 years ago. Presently at 337 pounds  6. Depression. Lexapro and Xanax. Provide emotional support and positive reinforcement  7. Anemia. Patient has been transfused. Continue Aranesp. Followup CBC with dialysis. He gets transfusions w/dialysis prn 8. Tobacco abuse. NicoDerm patch  9. History of VRE. Contact precautions  10. Chronic left foot drop: AFO was ordered 08/30/11. We'll discuss  with therapy team. He will need a heavy-duty brace to accommodate his weight. Likely a double upright brace will be needed. Will follow up with [employer] regarding order.   LOS (Days) 3 A FACE TO FACE EVALUATION WAS PERFORMED  Alex Derrill Bagnell 09/04/2011, 10:14 AM

## 2011-09-05 LAB — PROTIME-INR
INR: 2.85 — ABNORMAL HIGH (ref 0.00–1.49)
Prothrombin Time: 30.4 seconds — ABNORMAL HIGH (ref 11.6–15.2)

## 2011-09-05 MED ORDER — HEPARIN SODIUM (PORCINE) 1000 UNIT/ML DIALYSIS
20.0000 [IU]/kg | INTRAMUSCULAR | Status: DC | PRN
  Administered 2011-09-06: 3300 [IU] via INTRAVENOUS_CENTRAL
  Filled 2011-09-05: qty 4

## 2011-09-05 MED ORDER — OXYCODONE HCL 5 MG PO TABS
15.0000 mg | ORAL_TABLET | ORAL | Status: DC | PRN
  Administered 2011-09-05 – 2011-09-10 (×9): 15 mg via ORAL
  Filled 2011-09-05 (×9): qty 3

## 2011-09-05 MED ORDER — WARFARIN SODIUM 10 MG PO TABS
10.0000 mg | ORAL_TABLET | Freq: Once | ORAL | Status: AC
  Administered 2011-09-05: 10 mg via ORAL
  Filled 2011-09-05: qty 1

## 2011-09-05 MED ORDER — OXYCODONE HCL 15 MG PO TB12
60.0000 mg | ORAL_TABLET | Freq: Two times a day (BID) | ORAL | Status: DC
  Administered 2011-09-05 – 2011-09-10 (×10): 60 mg via ORAL
  Filled 2011-09-05 (×11): qty 4

## 2011-09-05 NOTE — Progress Notes (Signed)
[facility]    Subjective:  Awake, alert, says he usually dialyzes 5 hr (not 4)  Objective: Vital signs in last 24 hours: Blood pressure 100/58, pulse 69, temperature 97.6 F (36.4 C), temperature source Oral, resp. rate 16, height 6' (1.829 m), weight 167.1 kg (368 lb 6.2 oz), SpO2 93.00%.    PHYSICAL EXAM General--awake, alert Chest--clear Heart--no rub Abd--corpulent Extr--no edema, AVF patent L forearm  Lab Results:   Lab 09/03/11 1842 09/01/11 0720 08/30/11 1011  NA 134* 138 134*  K 4.3 4.6 4.5  CL 95* 101 97  CO2 25 27 22   BUN 57* 59* 77*  CREATININE 5.86* 5.82* 6.83*  ALB -- -- --  GLUCOSE 95 -- --  CALCIUM 8.9 9.0 8.9  PHOS 4.8* 5.2* --     Basename 09/03/11 1842  WBC 8.9  HGB 7.6*  HCT 23.5*  PLT 233    Assessment/Plan: 1.  Hip fx on L.  S/P ORIF--on coumadin, receiving PT 2.  ESRD  EDW usually 361 lb.  Post HD weight Saturday was 367 lb.  Will try to lower wt if BP will allow .  Check PTH.  On no Vit D 3.  Anemia--check Fe studies.  On Arasesp 60/wk 4.  High BP--on no meds now   LOS: 4 days   Shamica Moree F 09/05/2011,1:53 PM   .labalb

## 2011-09-05 NOTE — Plan of Care (Signed)
Problem: RH SKIN INTEGRITY Goal: RH STG SKIN FREE OF INFECTION/BREAKDOWN Max assist  Outcome: Progressing Denuded area, abrasion, and stage 2 pressure ulcer. Allevyn dressing to area. Abdominal wound with moderate amount of green yellow drainage. Requiring daily dressing change.

## 2011-09-05 NOTE — Plan of Care (Signed)
Problem: RH BOWEL ELIMINATION Goal: RH STG MANAGE BOWEL WITH ASSISTANCE STG Manage Bowel with Assistance. Modified Independent.  Outcome: Progressing Pt currently continent of bowel

## 2011-09-05 NOTE — Progress Notes (Signed)
ANTICOAGULATION CONSULT NOTE - Follow Up Consult  Pharmacy Consult for Coumadin Indication: Hx DVT  Allergies  Allergen Reactions  . Zosyn Hives    Patient Measurements: Height: 6' (182.9 cm) Weight: 368 lb 6.2 oz (167.1 kg) IBW/kg (Calculated) : 77.6   Vital Signs: Temp: 97.6 F (36.4 C) (04/08 0610) Temp src: Oral (04/08 0610) BP: 100/58 mmHg (04/08 0610) Pulse Rate: 69  (04/08 0610)  Labs:  Alvira Philips 09/05/11 0545 09/04/11 0555 09/03/11 1842 09/03/11 0630  HGB -- -- 7.6* --  HCT -- -- 23.5* --  PLT -- -- 233 --  APTT -- -- -- --  LABPROT 30.4* 31.3* -- 32.3*  INR 2.85* 2.96* -- 3.08*  HEPARINUNFRC -- -- -- --  CREATININE -- -- 5.86* --  CKTOTAL -- -- -- --  CKMB -- -- -- --  TROPONINI -- -- -- --   Estimated Creatinine Clearance: 26.1 ml/min (by C-G formula based on Cr of 5.86).  Assessment: Patient is a 46 y.o M on coumadin for Hx DVT with a therapeutic INR this a.m (2.89 << 2.96). Dose was held on 4/6 to allow INR to trend down. Hgb/Hct/Plt ok, no bleeding noted.   Goal of Therapy:  INR 2-3   Plan:  1. Warfarin 10 mg x 1 dose at 1800 today 2. Will continue to monitor for any signs/symptoms of bleeding and will follow up with PT/INR in the a.m.   Georgina Pillion, PharmD, BCPS Clinical Pharmacist Pager: [phone] 09/05/2011 1:05 PM

## 2011-09-05 NOTE — Progress Notes (Signed)
Subjective/Complaints: Review of Systems  Respiratory: Negative for cough.   Cardiovascular: Negative for chest pain.  Musculoskeletal: Positive for back pain and joint pain.  Neurological: Positive for sensory change and focal weakness.  overall had a better night and Sunday.    Objective: Vital Signs: Blood pressure 100/58, pulse 69, temperature 97.6 F (36.4 C), temperature source Oral, resp. rate 16, height 6' (1.829 m), weight 167.1 kg (368 lb 6.2 oz), SpO2 93.00%. No results found.  Basename 09/03/11 1842  WBC 8.9  HGB 7.6*  HCT 23.5*  PLT 233    Basename 09/03/11 1842  NA 134*  K 4.3  CL 95*  CO2 25  GLUCOSE 95  BUN 57*  CREATININE 5.86*  CALCIUM 8.9   CBG (last 3)  No results found for this basename: GLUCAP:3 in the last 72 hours  Wt Readings from Last 3 Encounters:  09/04/11 167.1 kg (368 lb 6.2 oz)  09/01/11 151.8 kg (334 lb 10.5 oz)  09/01/11 151.8 kg (334 lb 10.5 oz)    Physical Exam:  General appearance: alert, cooperative, moderate distress and severe distress Head: Normocephalic, without obvious abnormality, atraumatic Eyes: conjunctivae/corneas clear. PERRL, EOM's intact. Fundi benign. Ears: normal TM's and external ear canals both ears Nose: Nares normal. Septum midline. Mucosa normal. No drainage or sinus tenderness. Throat: lips, mucosa, and tongue normal; teeth and gums normal Neck: no adenopathy, no carotid bruit, no JVD, supple, symmetrical, trachea midline and thyroid not enlarged, symmetric, no tenderness/mass/nodules Back: back tender with palpation and minmal movement of legs Resp: clear to auscultation bilaterally Cardio: regular rate and rhythm, S1, S2 normal, no murmur, click, rub or gallop GI: soft, non-tender; bowel sounds normal; no masses,  no organomegaly Extremities: extremities normal, atraumatic, no cyanosis or edema Pulses: 2+ and symmetric Skin: breakdown superificial stage 2 along lower gluteal folds Neurologic: ongoing  ADF, APF weakness lle, has EHL weakness. Sensory loss in those areas also Incision/Wound: draining abdominal area with dressing (chronic). Left hiop wound cdi   Assessment/Plan: 1. Functional deficits secondary to left intertrochanteric hip fx, morbid obesity, chronic lumbar radiculopathy which require 3+ hours per day of interdisciplinary therapy in a comprehensive inpatient rehab setting. Physiatrist is providing close team supervision and 24 hour management of active medical problems listed below. Physiatrist and rehab team continue to assess barriers to discharge/monitor patient progress toward functional and medical goals. FIM: FIM - Bathing Bathing Steps Patient Completed: Chest;Right Arm;Left Arm;Abdomen;Front perineal area;Right upper leg;Left upper leg Bathing: 3: Mod-Patient completes 5-7 35f 10 parts or 50-74%  FIM - Upper Body Dressing/Undressing Upper body dressing/undressing: 0: Wears gown/pajamas-no public clothing FIM - Lower Body Dressing/Undressing Lower body dressing/undressing: 0: Activity did not occur  FIM - Toileting Toileting: 0: Activity did not occur     FIM - Banker Devices: Sliding board;Arm rests;Bed rails Bed/Chair Transfer: 2: Supine > Sit: Max A (lifting assist/Pt. 25-49%);1: Two helpers  FIM - Locomotion: Printmaker: Wheelchair: 6: Travels 150 ft or more, turns around, maneuvers to table, bed or toilet, negotiates 3% grade: maneuvers on rugs and over door sills independently FIM - Locomotion: Ambulation Locomotion: Ambulation: 0: Activity did not occur (unsafe to attempt)  Comprehension Comprehension Mode: Auditory Comprehension: 7-Follows complex conversation/direction: With no assist  Expression Expression Mode: Verbal Expression: 7-Expresses complex ideas: With no assist  Social Interaction Social Interaction: 5-Interacts appropriately 90% of the time - Needs monitoring or encouragement for  participation or interaction.  Problem Solving Problem Solving: 7-Solves complex problems: Recognizes &  self-corrects  Memory Memory: 7-Complete Independence: No helper 1. left intertrochanteric hip fracture after a fall. Status post ORIF with hip screw  2. DVT Prophylaxis/Anticoagulation: Chronic Coumadin for history of DVT. Monitor for any signs of bleeding  3. Pain Management: Increase OxyContin to 60 mg every 12 hours. Aggressive muscle relaxants. Will add oxy ir 15mg  po for breakthrough and try to move away from iv dilaudid- spoke with patient today.  -limited pain coping skills 4. End-stage renal disease. Hemodialysis as per [facility]  5. Morbid obesity. Status post gastric sleeve procedure 2 years ago. Presently at 337 pounds  6. Depression. Lexapro and Xanax. Provide emotional support and positive reinforcement. 7. Anemia. Patient has been transfused. Continue Aranesp. Followup CBC with dialysis. He gets transfusions w/dialysis prn 8. Tobacco abuse. NicoDerm patch  9. History of VRE. Contact precautions  10. Chronic left foot drop: AFO was ordered 08/30/11. We'll discuss with therapy team. He will need a heavy-duty brace to accommodate his weight. Likely a double upright brace will be needed. Will follow up with [employer] regarding order.   LOS (Days) 4 A FACE TO FACE EVALUATION WAS PERFORMED  Azilee Pirro T 09/05/2011, 8:11 AM

## 2011-09-05 NOTE — Progress Notes (Addendum)
Occupational Therapy Note  Patient Details  Name: [name] MRN: [id number] Date of Birth: [birth year] Today's Date: 09/05/2011  1st Session Time: 1000-1030 Pt missed 30 minutes for nursing care Pain: 7/10 left hip, RN aware Individual therapy  Pt in bed upon arrival receiving wound care from nursing.  Pt performed bathing at bed level, stating that he completed these tasks at home with assistance from wife PRN.  Pt required assistance to bathe buttocks and thread pants on LLE.  Pt stated that LLE/hip was very painful with movement.  Pt pulled up pants without assistance.  Pt donned hospital gown for UB.  Pt remained in bed secondary to limited time available.  Focus on activity tolerance and bed mobility.   2nd Session Time: 1400-1430 Pain: 4/10 left hip, RN aware Individual Therapy  Pt in bed upon arrival stating that he had just returned to bed approx 15 mins prior to therapy.  Pt issued theraband and performed therex at bed level.  Pt instructed in exercises to increase activity tolerance and UB strength.  Pt verbalized understanding and stated he was performing UB exercises at home prior to this hospitalization.   Lavone Neri [facility] 09/05/2011, 4:06 PM

## 2011-09-05 NOTE — Progress Notes (Signed)
Physical Therapy Note  Patient Details  Name: [name] MRN: [id number] Date of Birth: [birth year] Today's Date: 09/05/2011  10:35-11:20 individual therapy 8/10 pain left hip. Pt was premedicated  Performed left LE heel slides AAROM, hip abduction AAROM x 10 each. Supine to sit mod assist. With vc for technique. Sliding board transfer with min assist and vc for weight shift and hand position.   [name] 09/05/2011, 11:50 AM

## 2011-09-05 NOTE — Plan of Care (Signed)
No void-HD. Remains continent of bowel. Last bowel movement 09/04/11. L forearm with AV cath, (+) for bruit and thrill. LUQ abdominal wound with moderate amount of green/yellow drainage. Wound cleaned, and dry dressing reapplied to area. Nicotine patch to L deltoid. LLE edemaous. L hip incision staples removed, per order. Pt tolerated procedure well. Bruising noted to R sacrum, abrasion and scratches noted to mid sacrum, stage 2 pressure ulcer to L sacrum. Allevyn dressing applied to areas. Pain poorly controlled. On scheduled Oxy IR 60mg  q 12hrs, and Robaxin 1000 mg q.i.d,. Continues to request Dilaudid IV 4mg  q 4hrs. Also received Vicodin 325mg  x 2 @0554  and 1636.

## 2011-09-05 NOTE — Progress Notes (Signed)
Physical Therapy Note  Patient Details  Name: [name] MRN: [id number] Date of Birth: July 09, 1965 Today's Date: 09/05/2011  15:15-16:15 individual therapy 8/10 pain in left leg. Pt premedicated and ice applied.  Performed HEP for supine exercises including SAQ, quad and glut sets, SLR, and heel slides with left leg AAROM x 10 each.   [name] 09/05/2011, 5:04 PM

## 2011-09-06 ENCOUNTER — Inpatient Hospital Stay (HOSPITAL_COMMUNITY): Payer: BC Managed Care – PPO

## 2011-09-06 DIAGNOSIS — E669 Obesity, unspecified: Secondary | ICD-10-CM

## 2011-09-06 DIAGNOSIS — W19XXXA Unspecified fall, initial encounter: Secondary | ICD-10-CM

## 2011-09-06 DIAGNOSIS — N189 Chronic kidney disease, unspecified: Secondary | ICD-10-CM

## 2011-09-06 DIAGNOSIS — M47817 Spondylosis without myelopathy or radiculopathy, lumbosacral region: Secondary | ICD-10-CM

## 2011-09-06 DIAGNOSIS — Z5189 Encounter for other specified aftercare: Secondary | ICD-10-CM

## 2011-09-06 DIAGNOSIS — S72143A Displaced intertrochanteric fracture of unspecified femur, initial encounter for closed fracture: Secondary | ICD-10-CM

## 2011-09-06 LAB — IRON AND TIBC: Iron: 41 ug/dL — ABNORMAL LOW (ref 42–135)

## 2011-09-06 LAB — RENAL FUNCTION PANEL
Albumin: 2.4 g/dL — ABNORMAL LOW (ref 3.5–5.2)
BUN: 58 mg/dL — ABNORMAL HIGH (ref 6–23)
Chloride: 98 mEq/L (ref 96–112)
GFR calc Af Amer: 13 mL/min — ABNORMAL LOW (ref >90)
GFR calc non Af Amer: 11 mL/min — ABNORMAL LOW (ref >90)
Potassium: 4.5 mEq/L (ref 3.5–5.1)

## 2011-09-06 LAB — CBC
HCT: 24.9 % — ABNORMAL LOW (ref 39.0–52.0)
Platelets: 259 10*3/uL (ref 150–400)
RDW: 16.7 % — ABNORMAL HIGH (ref 11.5–15.5)
WBC: 8.8 10*3/uL (ref 4.0–10.5)

## 2011-09-06 LAB — PROTIME-INR: Prothrombin Time: 31.5 seconds — ABNORMAL HIGH (ref 11.6–15.2)

## 2011-09-06 MED ORDER — HYDROMORPHONE HCL PF 1 MG/ML IJ SOLN
INTRAMUSCULAR | Status: AC
  Administered 2011-09-06: 4 mg
  Filled 2011-09-06: qty 4

## 2011-09-06 MED ORDER — HYDROMORPHONE HCL PF 4 MG/ML IJ SOLN
4.0000 mg | INTRAMUSCULAR | Status: DC | PRN
  Administered 2011-09-06 – 2011-09-08 (×8): 4 mg via INTRAVENOUS
  Filled 2011-09-06 (×8): qty 1

## 2011-09-06 MED ORDER — WARFARIN SODIUM 7.5 MG PO TABS
7.5000 mg | ORAL_TABLET | Freq: Once | ORAL | Status: AC
  Administered 2011-09-06: 7.5 mg via ORAL
  Filled 2011-09-06: qty 1

## 2011-09-06 NOTE — Progress Notes (Signed)
NT attempted to weigh patient using bed scale and found  Large difference in today's weight and 09/05/11 weight.  Patient refusing to be weighed using Maximove due to causing too much pain in lt hip.  Notified hemodialysis tech.

## 2011-09-06 NOTE — Progress Notes (Signed)
In dialysis first portion of the shift, resting in bed comfortably after dialysis later part of the shift. Continues to request Dilaudid 4mg  prn q 4hrs.

## 2011-09-06 NOTE — Progress Notes (Signed)
Social Work Patient ID: [name], male   DOB: 1966/04/29, 46 y.o.   MRN: [id number]   Met with pt to inform of team conference goals-supervision/ min  Level and discharge 4/13. He is pleased with the plan and will be ready to go home then.  Will have wife come in and go through Therapies with pt prior to discharge.  Has all DME will set up follow up therapies at home.  Pain continues to Be his main issue with therapies.

## 2011-09-06 NOTE — Progress Notes (Signed)
[facility]  On HD via L forearm AVF BP-- 106/47 Goal--4.8L Hgb--  7.9 K+--4.5 PTH and Fe studies drawn in HD.

## 2011-09-06 NOTE — Progress Notes (Signed)
Patient ID: [name], male   DOB: September 01, 1965, 46 y.o.   MRN: [id number]  Subjective/Complaints: Review of Systems  Respiratory: Negative for cough.   Cardiovascular: Negative for chest pain.  Musculoskeletal: Positive for back pain and joint pain.  Neurological: Positive for sensory change and focal weakness.  early HD today  Objective: Vital Signs: Blood pressure 109/61, pulse 69, temperature 98.1 F (36.7 C), temperature source Oral, resp. rate 20, height 6' (1.829 m), weight 168.33 kg (371 lb 1.6 oz), SpO2 94.00%. No results found.  Basename 09/06/11 0552 09/03/11 1842  WBC 8.8 8.9  HGB 7.9* 7.6*  HCT 24.9* 23.5*  PLT 259 233    Basename 09/06/11 0552 09/03/11 1842  NA 136 134*  K 4.5 4.3  CL 98 95*  CO2 27 25  GLUCOSE 86 95  BUN 58* 57*  CREATININE 5.80* 5.86*  CALCIUM 9.1 8.9   CBG (last 3)  No results found for this basename: GLUCAP:3 in the last 72 hours  Wt Readings from Last 3 Encounters:  09/05/11 168.33 kg (371 lb 1.6 oz)  09/01/11 151.8 kg (334 lb 10.5 oz)  09/01/11 151.8 kg (334 lb 10.5 oz)    Physical Exam:  General appearance: alert, cooperative, moderate distress and severe distress Head: Normocephalic, without obvious abnormality, atraumatic Eyes: conjunctivae/corneas clear. PERRL, EOM's intact. Fundi benign. Ears: normal TM's and external ear canals both ears Nose: Nares normal. Septum midline. Mucosa normal. No drainage or sinus tenderness. Throat: lips, mucosa, and tongue normal; teeth and gums normal Neck: no adenopathy, no carotid bruit, no JVD, supple, symmetrical, trachea midline and thyroid not enlarged, symmetric, no tenderness/mass/nodules Back: back tender with palpation and minmal movement of legs Resp: clear to auscultation bilaterally Cardio: regular rate and rhythm, S1, S2 normal, no murmur, click, rub or gallop GI: soft, non-tender; bowel sounds normal; no masses,  no organomegaly Extremities: extremities normal, atraumatic,  no cyanosis or edema Pulses: 2+ and symmetric Skin: breakdown superificial stage 2 along lower gluteal folds Neurologic: ongoing ADF, APF weakness lle, has EHL weakness. Sensory loss in those areas also Incision/Wound: draining abdominal area with dressing (chronic). Left hip wound clean 4/9  Assessment/Plan: 1. Functional deficits secondary to left intertrochanteric hip fx, morbid obesity, chronic lumbar radiculopathy which require 3+ hours per day of interdisciplinary therapy in a comprehensive inpatient rehab setting. Physiatrist is providing close team supervision and 24 hour management of active medical problems listed below. Physiatrist and rehab team continue to assess barriers to discharge/monitor patient progress toward functional and medical goals. FIM: FIM - Bathing Bathing Steps Patient Completed: Chest;Right Arm;Left Arm;Abdomen;Front perineal area;Right upper leg;Left upper leg Bathing: 3: Mod-Patient completes 5-7 2f 10 parts or 50-74%  FIM - Upper Body Dressing/Undressing Upper body dressing/undressing: 0: Wears gown/pajamas-no public clothing FIM - Lower Body Dressing/Undressing Lower body dressing/undressing steps patient completed: Thread/unthread right pants leg;Pull pants up/down;Don/Doff right sock Lower body dressing/undressing: 2: Max-Patient completed 25-49% of tasks  FIM - Toileting Toileting: 0: Activity did not occur     FIM - Bed/Chair Transfer Bed/Chair Transfer Assistive Devices: Sliding board;HOB elevated Bed/Chair Transfer: 3: Supine > Sit: Mod A (lifting assist/Pt. 50-74%/lift 2 legs;4: Bed > Chair or W/C: Min A (steadying Pt. > 75%)  FIM - Locomotion: Wheelchair Locomotion: Wheelchair: 6: Travels 150 ft or more, turns around, maneuvers to table, bed or toilet, negotiates 3% grade: maneuvers on rugs and over door sills independently FIM - Locomotion: Ambulation Locomotion: Ambulation: 0: Activity did not occur (unsafe to  attempt)  Comprehension  Comprehension Mode: Auditory Comprehension: 7-Follows complex conversation/direction: With no assist  Expression Expression Mode: Verbal Expression: 7-Expresses complex ideas: With no assist  Social Interaction Social Interaction: 6-Interacts appropriately with others with medication or extra time (anti-anxiety, antidepressant).  Problem Solving Problem Solving: 7-Solves complex problems: Recognizes & self-corrects  Memory Memory: 7-Complete Independence: No helper 1. left intertrochanteric hip fracture after a fall. Status post ORIF with hip screw  2. DVT Prophylaxis/Anticoagulation: Chronic Coumadin for history of DVT. Monitor for any signs of bleeding  3. Pain Management: Increased OxyContin to 60 mg every 12 hours. Aggressive muscle relaxants. Added oxy ir 15mg   -moving away from iv dilaudid. He does seem to be making improvement.  -limited pain coping skills 4. End-stage renal disease. Hemodialysis as per [facility]  5. Morbid obesity. Status post gastric sleeve procedure 2 years ago.   6. Depression. Lexapro and Xanax. Provide emotional support and positive reinforcement. 7. Anemia. Patient has been transfused. Continue Aranesp. Followup CBC with dialysis. He gets transfusions w/dialysis prn 8. Tobacco abuse. NicoDerm patch  9. History of VRE. Contact precautions  10. Chronic left foot drop: AFO was ordered 08/30/11. We'll discuss with therapy team. He will need a heavy-duty brace to accommodate his weight. Likely a double upright brace will be needed. Will follow up with [employer] regarding order.   LOS (Days) 5 A FACE TO FACE EVALUATION WAS PERFORMED  Jamille Fisher T 09/06/2011, 7:06 AM

## 2011-09-06 NOTE — Progress Notes (Signed)
ANTICOAGULATION CONSULT NOTE - Follow Up Consult  Pharmacy Consult for Coumadin Indication: Hx DVT  Allergies  Allergen Reactions  . Zosyn Hives    Patient Measurements: Height: 6' (182.9 cm) Weight: 368 lb 6.2 oz (167.1 kg) IBW/kg (Calculated) : 77.6   Vital Signs: Temp: 96.9 F (36.1 C) (04/09 0706) Temp src: Oral (04/09 0706) BP: 121/46 mmHg (04/09 1030) Pulse Rate: 80  (04/09 1030)  Labs:  Basename 09/06/11 0552 09/05/11 0545 09/04/11 0555 09/03/11 1842  HGB 7.9* -- -- 7.6*  HCT 24.9* -- -- 23.5*  PLT 259 -- -- 233  APTT -- -- -- --  LABPROT 31.5* 30.4* 31.3* --  INR 2.99* 2.85* 2.96* --  HEPARINUNFRC -- -- -- --  CREATININE 5.80* -- -- 5.86*  CKTOTAL -- -- -- --  CKMB -- -- -- --  TROPONINI -- -- -- --   Estimated Creatinine Clearance: 26.3 ml/min (by C-G formula based on Cr of 5.8).  Assessment: Patient is a 46 y.o M on coumadin for Hx DVT with a therapeutic INR this a.m (2.99 << 2.89). Dose was held on 4/6 to allow INR to trend down. Hgb/Hct slight trend up, no bleeding noted.   Goal of Therapy:  INR 2-3   Plan:  1. Warfarin 7.5 mg x 1 dose at 1800 today 2. Will continue to monitor for any signs/symptoms of bleeding and will follow up with PT/INR in the a.m.   Georgina Pillion, PharmD, BCPS Clinical Pharmacist Pager: [phone] 09/06/2011 11:26 AM

## 2011-09-06 NOTE — Progress Notes (Signed)
Occupational Therapy Note  Patient Details  Name: [name] MRN: [id number] Date of Birth: 11-06-1965 Today's Date: 09/06/2011  Pt missed 60 minutes skilled OT services secondary to off unit for hemodialysis.   Lavone Neri [facility] 09/06/2011, 10:13 AM

## 2011-09-07 LAB — PROTIME-INR
INR: 2.67 — ABNORMAL HIGH (ref 0.00–1.49)
Prothrombin Time: 28.9 seconds — ABNORMAL HIGH (ref 11.6–15.2)

## 2011-09-07 LAB — PARATHYROID HORMONE, INTACT (NO CA): PTH: 80.2 pg/mL — ABNORMAL HIGH (ref 14.0–72.0)

## 2011-09-07 MED ORDER — SODIUM CHLORIDE 0.9 % IV SOLN
125.0000 mg | INTRAVENOUS | Status: DC
  Administered 2011-09-08 – 2011-09-10 (×2): 125 mg via INTRAVENOUS
  Filled 2011-09-07 (×3): qty 10

## 2011-09-07 MED ORDER — HEPARIN SODIUM (PORCINE) 1000 UNIT/ML DIALYSIS
20.0000 [IU]/kg | INTRAMUSCULAR | Status: DC | PRN
  Filled 2011-09-07: qty 4

## 2011-09-07 MED ORDER — HYDROCODONE-ACETAMINOPHEN 10-325 MG PO TABS
2.0000 | ORAL_TABLET | Freq: Four times a day (QID) | ORAL | Status: DC | PRN
  Administered 2011-09-08: 2 via ORAL
  Filled 2011-09-07 (×2): qty 2

## 2011-09-07 MED ORDER — WARFARIN SODIUM 10 MG PO TABS
10.0000 mg | ORAL_TABLET | Freq: Once | ORAL | Status: AC
  Administered 2011-09-07: 10 mg via ORAL
  Filled 2011-09-07: qty 1

## 2011-09-07 NOTE — Evaluation (Signed)
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 Of note is that he lost approximately 400 pounds after his gastric bypass surgery. At baseline he was able to ambulate 50 feet with a RW and no brace on LLE with limited endurance and used a sliding board for transfers at home and also driving. He used [employer] tie down in [place] for transport and [employer] in community. Admitted March 27 after a fall at home (while standing and turning) sustaining a left intertrochanteric hip fracture. His Coumadin was held until INR less than 2.0 and underwent left hip open reduction internal fixation with hip screw March 29 per Dr. [name]. Advise 20 pounds to nonweightbearing left lower extremity for 4-6 weeks. Patient has had difficulty maintaining his weightbearing status. Chronic Coumadin has been resumed for history of DVT. Patient on contact precautions for history of VRE. Dialysis as advised per [facility]. Noted anemia 7.2 April 1 and transfuse with dialysis. Patient with chronic pain presently on OxyContin 15 mg every 12 hours as he had been on 30 mg twice daily prior to admission. Physical therapy ongoing with plan to order a left AFO brace that was done 08/30/11 for chronic foot drop with recommendations for physical medicine rehabilitation consult to consider inpatient rehabilitation services (this PT recommends he use PRAFO for ROM maintenance but not be evaluated for AFO until full WB, pt himself states this has NOT occurred in  this hospital) Patient transferred to CIR on 09/01/2011.    Met with pt and discussed leisure/community pursuits.  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  Educated on the importance of remaining active to increase activity tolerance and functional mobility.  Pt stated understanding.  Pt scheduled for discharge home on 09/10/11.  No further TR implemented at this time.  Will continue to monitor.  The above assessment, treatment plan, treatment alternatives and goals were discussed and mutually agreed upon: by patient  Lakendra Helling 09/07/2011, 4:46 PM

## 2011-09-07 NOTE — Progress Notes (Signed)
Patient ID: [name], male   DOB: [birth year], 46 y.o.   MRN: [id number] Patient ID: [name], male   DOB: 1966/02/15, 46 y.o.   MRN: [id number]  Subjective/Complaints: Review of Systems  Respiratory: Negative for cough.   Cardiovascular: Negative for chest pain.  Musculoskeletal: Positive for back pain and joint pain.  Neurological: Positive for sensory change and focal weakness.  4-10 feels better today.  More awake. Low back and left hip still sore.  Objective: Vital Signs: Blood pressure 103/65, pulse 70, temperature 98.6 F (37 C), temperature source Oral, resp. rate 20, height 6' (1.829 m), weight 167.1 kg (368 lb 6.2 oz), SpO2 93.00%. No results found.  Basename 09/06/11 0552  WBC 8.8  HGB 7.9*  HCT 24.9*  PLT 259    Basename 09/06/11 0552  NA 136  K 4.5  CL 98  CO2 27  GLUCOSE 86  BUN 58*  CREATININE 5.80*  CALCIUM 9.1   CBG (last 3)  No results found for this basename: GLUCAP:3 in the last 72 hours  Wt Readings from Last 3 Encounters:  09/06/11 167.1 kg (368 lb 6.2 oz)  09/01/11 151.8 kg (334 lb 10.5 oz)  09/01/11 151.8 kg (334 lb 10.5 oz)    Physical Exam:  General appearance: alert, cooperative, moderate distress and severe distress Head: Normocephalic, without obvious abnormality, atraumatic Eyes: conjunctivae/corneas clear. PERRL, EOM's intact. Fundi benign. Ears: normal TM's and external ear canals both ears Nose: Nares normal. Septum midline. Mucosa normal. No drainage or sinus tenderness. Throat: lips, mucosa, and tongue normal; teeth and gums normal Neck: no adenopathy, no carotid bruit, no JVD, supple, symmetrical, trachea midline and thyroid not enlarged, symmetric, no tenderness/mass/nodules Back: back tender with palpation and minmal movement of legs Resp: clear to auscultation bilaterally Cardio: regular rate and rhythm, S1, S2 normal, no murmur, click, rub or gallop GI: soft, non-tender; bowel sounds normal; no masses,  no  organomegaly Extremities: extremities normal, atraumatic, no cyanosis or edema Pulses: 2+ and symmetric Skin: breakdown superificial stage 2 along lower gluteal folds Neurologic: ongoing ADF, APF weakness lle, has EHL weakness. Sensory loss in those areas also, cognitvely intact Incision/Wound: draining abdominal area with dressing (chronic). Sacral areas stable. Left hip wound clean with staples out. 4/10-exam  Assessment/Plan: 1. Functional deficits secondary to left intertrochanteric hip fx, morbid obesity, chronic lumbar radiculopathy which require 3+ hours per day of interdisciplinary therapy in a comprehensive inpatient rehab setting. Physiatrist is providing close team supervision and 24 hour management of active medical problems listed below. Physiatrist and rehab team continue to assess barriers to discharge/monitor patient progress toward functional and medical goals. FIM: FIM - Bathing Bathing Steps Patient Completed: Chest;Right Arm;Left Arm;Abdomen;Front perineal area;Right upper leg;Left upper leg Bathing: 3: Mod-Patient completes 5-7 34f 10 parts or 50-74%  FIM - Upper Body Dressing/Undressing Upper body dressing/undressing: 0: Wears gown/pajamas-no public clothing FIM - Lower Body Dressing/Undressing Lower body dressing/undressing steps patient completed: Thread/unthread right pants leg;Pull pants up/down;Don/Doff right sock Lower body dressing/undressing: 2: Max-Patient completed 25-49% of tasks  FIM - Toileting Toileting: 0: Activity did not occur     FIM - Bed/Chair Transfer Bed/Chair Transfer Assistive Devices: Sliding board;HOB elevated Bed/Chair Transfer: 3: Supine > Sit: Mod A (lifting assist/Pt. 50-74%/lift 2 legs;4: Bed > Chair or W/C: Min A (steadying Pt. > 75%)  FIM - Locomotion: Wheelchair Locomotion: Wheelchair: 6: Travels 150 ft or more, turns around, maneuvers to table, bed or toilet, negotiates 3% grade: maneuvers on rugs and over door sills  independently FIM - Locomotion: Ambulation Locomotion: Ambulation: 0: Activity did not occur (unsafe to attempt)  Comprehension Comprehension Mode: Auditory Comprehension: 7-Follows complex conversation/direction: With no assist  Expression Expression Mode: Verbal Expression: 7-Expresses complex ideas: With no assist  Social Interaction Social Interaction: 6-Interacts appropriately with others with medication or extra time (anti-anxiety, antidepressant).  Problem Solving Problem Solving: 7-Solves complex problems: Recognizes & self-corrects  Memory Memory: 7-Complete Independence: No helper 1. left intertrochanteric hip fracture after a fall. Status post ORIF with hip screw  2. DVT Prophylaxis/Anticoagulation: Chronic Coumadin for history of DVT. Monitor for any signs of bleeding  3. Pain Management: Increased OxyContin to 60 mg every 12 hours. Aggressive muscle relaxants. Added oxy ir 15mg   -moving away from iv dilaudid. Will decrease frequency  He does seem to be making improvement.  -limited pain coping skills 4. End-stage renal disease. Hemodialysis as per [facility]  5. Morbid obesity. Status post gastric sleeve procedure 2 years ago.   6. Depression. Lexapro and Xanax. Provide emotional support and positive reinforcement. 7. Anemia. Patient has been transfused. Continue Aranesp. Followup CBC with dialysis. He gets transfusions w/dialysis prn 8. Tobacco abuse. NicoDerm patch  9. History of VRE. Contact precautions  10. Chronic left foot drop: AFO was ordered 08/30/11. We'll discuss with therapy team. He will need a heavy-duty brace to accommodate his weight. Likely a double upright brace will be needed at some point but will hold off given his non-ambulatory status.   LOS (Days) 6 A FACE TO FACE EVALUATION WAS PERFORMED  Noha Karasik T 09/07/2011, 7:41 AM

## 2011-09-07 NOTE — Progress Notes (Signed)
Occupational Therapy Session Note  Patient Details  Name: [name] MRN: [id number] Date of Birth: Dec 12, 1965  Today's Date: 09/07/2011 Time: 1130-1200 Time Calculation (min): 30 min   Skilled Therapeutic Interventions/Progress Updates:    Pt in w/c to participate in UB therapeutic exercise with Theraband.  Issued Blue (Level 4) theraband.  Pt completing exercise independently.  Therapy Documentation Precautions:  Precautions Precautions: Fall Required Braces or Orthoses: No Other Brace/Splint: Consult for AFO for LLE but currently doesn't have one. Restrictions Weight Bearing Restrictions: Yes LLE Weight Bearing: Touchdown weight bearing Other Position/Activity Restrictions: 20 # weight bearing limit   Pain: Pain Assessment Pain Assessment: 0-10 Pain Score:   8 Faces Pain Scale: Hurts a little bit Pain Type: Acute pain;Surgical pain Pain Location: Leg Pain Orientation: Left;Upper;Medial Pain Descriptors: Aching Pain Onset: On-going Patients Stated Pain Goal: 2 Pain Intervention(s): RN made aware  See FIM for current functional status  Therapy/Group: Individual Therapy  Rich Brave 09/07/2011, 12:13 PM

## 2011-09-07 NOTE — Plan of Care (Signed)
Problem: RH Toilet Transfers Goal: LTG Patient will perform toilet transfers w/assist (OT) LTG: Patient will perform toilet transfers with assist, with/without cues using equipment (OT)  Outcome: Not Applicable Date Met:  09/07/11 Goal discharged secondary to pt using bed pan only.

## 2011-09-07 NOTE — Progress Notes (Signed)
Occupational Therapy Session Note  Patient Details  Name: [name] MRN: [id number] Date of Birth: 08/01/1965  Today's Date: 09/07/2011 Time: 6295-2841 Time Calculation (min): 43 min  Short Term Goals: Week 1:  OT Short Term Goal 1 (Week 1): Pt will perfrom UB bathing and dressing sitting edge of bed unsupported with setup only. OT Short Term Goal 2 (Week 1): Pt will donn gripper socks and pants over feet in sitting with supervision using reacher. OT Short Term Goal 3 (Week 1): Pt will transfer supine to sit EOB with mod assist in preparation for selfcare tasks. OT Short Term Goal 4 (Week 1): Pt will perform bilateral UE therex to increase endurance and strength for sit to stand with independence following handout. OT Short Term Goal 5 (Week 1): Pt will perform sit to stand with total +2 pt 50% to assist with pulling pants over hips or peri hygeine.  Skilled Therapeutic Interventions/Progress Updates:    ADL retraining at bed level.  Pt requires assistance threading right leg but able to pull pants up without assistance. Pt states that he will complete these tasks at bed level after discharge and does not want to complete ADLs sitting EOB.  Focus on bed mobility and activity tolerance.  Therapy Documentation Precautions:  Precautions Precautions: Fall Required Braces or Orthoses: No Other Brace/Splint: Consult for AFO for LLE but currently doesn't have one. Restrictions Weight Bearing Restrictions: Yes LLE Weight Bearing: Touchdown weight bearing Other Position/Activity Restrictions: 20 # weight bearing limit   Pain: Pain Assessment Pain Assessment: 0-10 Pain Score:   8 Pain Type: Surgical pain;Acute pain Pain Location: Leg Pain Orientation: Left;Upper;Medial Pain Descriptors: Aching Pain Onset: On-going Patients Stated Pain Goal: 2 Pain Intervention(s): RN made aware  See FIM for current functional status  Therapy/Group: Individual Therapy  Rich Brave 09/07/2011, 10:42 AM

## 2011-09-07 NOTE — Progress Notes (Addendum)
Physical Therapy Session Note  Patient Details  Name: [name] MRN: [id number] Date of Birth: 09/09/1965  Today's Date: 09/07/2011 Time: 10:00-11:00 Time Calculation (min): 60 min  Short Term Goals: Week 1:  PT Short Term Goal 1 (Week 1): Pt will transfer supine to sit with mod A in preparation for OOB transfer PT Short Term Goal 2 (Week 1): Pt will perform LLE therex with mod A A per written HEP to decrease pain and increase strenght for mobility PT Short Term Goal 3 (Week 1): Pt will transfer level surfaces with slide board mod A  Skilled Therapeutic Interventions/Progress Updates:    Performed Lt. LE bed exercises initially. [Hip abduction, Adduction AAROM 2 x 10 reps each; AAROM Lt. Heel slides holding in flexion 2 x 10 reps; Bil. glute squeezes 10 x 5-10sec holds] Practiced rolling Rt. And Lt. In bed, repositioning trunk and hips with use of Rt. LE and bil. UEs. Performed supine to sit with min assist for Rt. LE however pt heavily reliant on rail and overhead trapeze. Recommended continued practice without trapeze as pt does not have this luxury at home. Performed Lt. AAROM long arc quads sitting EOB 3 x 10 reps, facilitating Lt. Quads. Transferred bed to motorized wheelchair with sliding board. Educated on lightly massaging scar tissue over Lt. Thigh incision to decrease risk of scar adhesion.  Practiced functional utilization of leg lifter to adjust Lt. LE post transfer and assist with home exercise program, verbal cues and min assist needed to adjust lifter for appropriate use.   Pt reporting improved ease with transfer, decreased pain throughout session compared to prior sessions. "I feel I am getting a little better every day."  Therapy Documentation Precautions:  Precautions Precautions: Fall Required Braces or Orthoses: No Other Brace/Splint: Consult for AFO for LLE but currently doesn't have one. Restrictions Weight Bearing Restrictions: Yes LLE Weight Bearing: Touchdown  weight bearing Other Position/Activity Restrictions: 20 # weight bearing limit Pain: Pain Assessment Pain Assessment: 0-10 Pain Score:   8 Faces Pain Scale: Hurts a little bit Pain Type: Acute pain;Surgical pain Pain Location: Leg Pain Orientation: Left;Upper;Medial Pain Descriptors: Aching Pain Onset: On-going Patients Stated Pain Goal: 2 Pain Intervention(s): RN made aware  See FIM for current functional status  Therapy/Group: Individual Therapy  Wilhemina Bonito 09/07/2011, 1:01 PM

## 2011-09-07 NOTE — Progress Notes (Signed)
ANTICOAGULATION CONSULT NOTE - Follow Up Consult  Pharmacy Consult for Coumadin Indication: Hx DVT  Allergies  Allergen Reactions  . Zosyn Hives    Patient Measurements: Height: 6' (182.9 cm) Weight: 368 lb 6.2 oz (167.1 kg) IBW/kg (Calculated) : 77.6   Vital Signs: Temp: 98.6 F (37 C) (04/10 0522) Temp src: Oral (04/10 0522) BP: 103/65 mmHg (04/10 0522) Pulse Rate: 70  (04/10 0522)  Labs:  Basename 09/07/11 0510 09/06/11 0552 09/05/11 0545  HGB -- 7.9* --  HCT -- 24.9* --  PLT -- 259 --  APTT -- -- --  LABPROT 28.9* 31.5* 30.4*  INR 2.67* 2.99* 2.85*  HEPARINUNFRC -- -- --  CREATININE -- 5.80* --  CKTOTAL -- -- --  CKMB -- -- --  TROPONINI -- -- --   Estimated Creatinine Clearance: 26.3 ml/min (by C-G formula based on Cr of 5.8).  Assessment: Patient is a 46 y.o M on coumadin for Hx DVT with a therapeutic INR this a.m (2.67 << 2.99). Dose was held on 4/6 to allow INR to trend down. No CBC today, no bleeding noted.   Goal of Therapy:  INR 2-3   Plan:  1. Warfarin 10 mg x 1 dose at 1800 today 2. Will continue to monitor for any signs/symptoms of bleeding and will follow up with PT/INR in the a.m.   Georgina Pillion, PharmD, BCPS Clinical Pharmacist Pager: [phone] 09/07/2011 9:13 AM

## 2011-09-07 NOTE — Progress Notes (Signed)
Occupational Therapy Session Note  Patient Details  Name: [name] MRN: [id number] Date of Birth: 1965/06/03  Today's Date: 09/07/2011 Time: 0865-7846 Time Calculation (min): 54 min   Skilled Therapeutic Interventions/Progress Updates:    Pt transferred back to bed using sliding board.  Therapist supervised while his wife assisted him with placement of the sliding board and helping to slide across the board.  She was positioned behind the pt and helped pull him across as he assisted with using his arms and RLE.  Progressed to UE exercises using blue theraband.  Performed shoulder flexion, elbow flexion,  elbow extension, shoulder horizontal abduction, and shoulder extension exercises.  Elbow exercises 1 set each 20 repetitions.  Shoulder flexion 10 repeitions for each arm.  Shoulder extension and horizontal abduction 1 set each for 15 repetitions.  All exercises performed in supine with HOB elevate approximately 20 degrees.  Therapy Documentation Precautions:  Precautions Precautions: Fall Required Braces or Orthoses: No Other Brace/Splint: Consult for AFO for LLE but currently doesn't have one. Restrictions Weight Bearing Restrictions: Yes LLE Weight Bearing: Touchdown weight bearing Other Position/Activity Restrictions: 20 # weight bearing limit  Pain: Pain Assessment Pain Assessment: 0-10 Pain Score:   3 Faces Pain Scale: Hurts a little bit Pain Type: Surgical pain Pain Location: Hip Pain Orientation: Left Pain Descriptors: Aching Pain Onset: On-going Patients Stated Pain Goal: 2 Pain Intervention(s): Medication (See eMAR);RN made aware;Repositioned Multiple Pain Sites: No ADL: ADL Eating: Independent Where Assessed-Eating: Bed level Grooming: Setup Where Assessed-Grooming: Bed level Upper Body Bathing: Setup Where Assessed-Upper Body Bathing: Bed level Lower Body Bathing: Maximal assistance Where Assessed-Lower Body Bathing: Bed level Upper Body Dressing:  Minimal assistance Where Assessed-Upper Body Dressing: Bed level Lower Body Dressing: Dependent Where Assessed-Lower Body Dressing: Bed level Toileting: Dependent Where Assessed-Toileting: Bed level Toilet Transfer: Moderate assistance Toilet Transfer Method:  (rolling for simulated bedpan.) Tub/Shower Transfer: Not assessed   See FIM for current functional status  Therapy/Group: Individual Therapy  [name] 09/07/2011, 3:19 PM

## 2011-09-08 ENCOUNTER — Inpatient Hospital Stay (HOSPITAL_COMMUNITY): Payer: BC Managed Care – PPO

## 2011-09-08 LAB — CBC
MCH: 30.9 pg (ref 26.0–34.0)
MCHC: 32 g/dL (ref 30.0–36.0)
MCV: 98.5 fL (ref 78.0–100.0)
Platelets: 259 10*3/uL (ref 150–400)
RDW: 16.9 % — ABNORMAL HIGH (ref 11.5–15.5)
RDW: 16.7 % — ABNORMAL HIGH (ref 11.5–15.5)

## 2011-09-08 LAB — RENAL FUNCTION PANEL
Albumin: 2.5 g/dL — ABNORMAL LOW (ref 3.5–5.2)
BUN: 47 mg/dL — ABNORMAL HIGH (ref 6–23)
GFR calc Af Amer: 14 mL/min — ABNORMAL LOW (ref >90)
Phosphorus: 4 mg/dL (ref 2.3–4.6)
Potassium: 4.5 mEq/L (ref 3.5–5.1)
Sodium: 134 mEq/L — ABNORMAL LOW (ref 135–145)

## 2011-09-08 MED ORDER — WARFARIN SODIUM 10 MG PO TABS
10.0000 mg | ORAL_TABLET | Freq: Once | ORAL | Status: AC
  Filled 2011-09-08: qty 1

## 2011-09-08 MED ORDER — DARBEPOETIN ALFA-POLYSORBATE 60 MCG/0.3ML IJ SOLN
INTRAMUSCULAR | Status: AC
  Administered 2011-09-08: 60 ug via INTRAVENOUS
  Filled 2011-09-08: qty 0.3

## 2011-09-08 MED ORDER — HYDROCODONE-ACETAMINOPHEN 5-325 MG PO TABS
ORAL_TABLET | ORAL | Status: AC
  Filled 2011-09-08: qty 2

## 2011-09-08 MED ORDER — DARBEPOETIN ALFA-POLYSORBATE 60 MCG/0.3ML IJ SOLN
60.0000 ug | INTRAMUSCULAR | Status: DC
  Filled 2011-09-08: qty 0.3

## 2011-09-08 MED ORDER — HYDROMORPHONE HCL PF 4 MG/ML IJ SOLN
4.0000 mg | Freq: Three times a day (TID) | INTRAMUSCULAR | Status: DC | PRN
  Administered 2011-09-08 (×2): 4 mg via INTRAVENOUS
  Filled 2011-09-08 (×2): qty 1

## 2011-09-08 MED ORDER — DARBEPOETIN ALFA-POLYSORBATE 150 MCG/0.3ML IJ SOLN
120.0000 ug | INTRAMUSCULAR | Status: DC
Start: 2011-09-15 — End: 2011-09-10

## 2011-09-08 NOTE — Care Management Note (Signed)
Patient ID: [name], male   DOB: 04/06/1966, 46 y.o.   MRN: [id number] Wednesday, update called to Lucrezia Europe at [place].  She agrees w/ d/c plan of 09/10/11.

## 2011-09-08 NOTE — Patient Care Conference (Signed)
Inpatient RehabilitationTeam Conference Note Date: 09/06/2011   Time: 2:05 PM    Patient Name: [name]      Medical Record Number: [id number]  Date of Birth: [birth year] Sex: Male         Room/Bed: 4009/4009-01 Payor Info: Payor: MEDICARE  Plan: MEDICARE PART A AND B  Product Type: *No Product type*     Admitting Diagnosis: L HIP FX  Admit Date/Time:  09/01/2011  3:50 PM Admission Comments: No comment available   Primary Diagnosis:  Hip fracture Principal Problem: Hip fracture  Patient Active Problem List  Diagnoses Date Noted  . Hip fracture 09/02/2011  . ESRD (end stage renal disease) on dialysis 08/24/2011  . Intertrochanteric fracture 08/24/2011  . Hypertension 08/24/2011  . Morbid obesity 08/24/2011  . GERD (gastroesophageal reflux disease) 08/24/2011  . OSA (obstructive sleep apnea) 08/24/2011  . DVT (deep venous thrombosis) 08/24/2011    Expected Discharge Date: Expected Discharge Date: 09/10/11  Team Members Present: Physician: Dr. Faith Rogue Case Manager Present: Melanee Spry, RN Social Worker Present: Dossie Der, LCSW Nurse Present: Daryll Brod, RN PT Present: Karolee Stamps, Judith Blonder, PTA OT Present: Edwin Cap, Felipa Eth, OT SLP Present: Feliberto Gottron, SLP Other (Discipline and Name): Tora Duck, PPS Coordinator     Current Status/Progress Goal Weekly Team Focus  Medical   Left intertrochanteric hip fracture with premorbid morbid obesity. Chronic low back pain has been a major problem  Pain management and increased activity tolerance. Better pain coping skills  See above   Bowel/Bladder   No void-Hemodialysis pt. Continent of bowel and bladder  Continent of bowel and bladder  Monitor   Swallow/Nutrition/ Hydration             ADL's   mod A bed mobility, mod A LB and bathing bed level  supervision/min A overall  activity tolerance, bed mobility, transfers   Mobility   mod assist bed mobility min assist transfer.  min assist  bed mobility and transfers  bed mobility, HEP, transfers   Communication             Safety/Cognition/ Behavioral Observations            Pain   Dilaudid IV 4mg  q 4hrs prn, Scheduled Robaxin 100mg , Scheduled Oxycodone   <4  Pain medication recently changed. Monitor effectiveness of medication regiment.   Skin   L hip incision, staples recently removed. Abdominal wound with green yellow drainage requiring daily dressing change. Bruising and denuded area to R buttock. Abrasion, and  stage 2 pressure ulcer with allevyn dressing  applied to area  No additional skin breakdown  Assess areas for appropriate healing. Encourage turn q 2hrs      *See Interdisciplinary Assessment and Plan and progress notes for long and short-term goals  Barriers to Discharge: Morbid obesity and pain    Possible Resolutions to Barriers:  Pain management and positive reinforcement by staff    Discharge Planning/Teaching Needs:  Home with wife and sister to assist, children to help some      Team Discussion: Standing is not a goal.  Goal is min assist slide board txfrs.  Pt needs to wean off IV pain med.  Will need HH RN to f/up skin issues.   Revisions to Treatment Plan: none    Continued Need for Acute Rehabilitation Level of Care: The patient requires daily medical management by a physician with specialized training in physical medicine and rehabilitation for the following conditions: Daily direction of a multidisciplinary  physical rehabilitation program to ensure safe treatment while eliciting the highest outcome that is of practical value to the patient.: Yes Daily medical management of patient stability for increased activity during participation in an intensive rehabilitation regime.: Yes Daily analysis of laboratory values and/or radiology reports with any subsequent need for medication adjustment of medical intervention for : Post surgical problems;Neurological problems  Brock Ra 09/08/2011, 1:59  PM

## 2011-09-08 NOTE — Progress Notes (Signed)
Physical Therapy Session Note  Patient Details  Name: [name] MRN: [id number] Date of Birth: 05-02-1966  Today's Date: 09/08/2011 Time: 9562-1308 Time Calculation (min): 57 min  Skilled Therapeutic Interventions/Progress Updates:   Wife present, completed family ed for transfer training, w/c to bed with sliding board.  Wife and pt both verbalizing no other issues to address for education for d/c home.  Unable to convince pt to try doing transfers to different surfaces or to stay up in the w/c to do UE exercises, pt wanting to transfer back to bed to do exercises.  Performed 20 reps of 3 UE exercises with theraband in supine, repeated hip exercises from am session.  Pt verbalizing that he believes doing the exercises helps him the most.   Therapy Documentation Precautions:  Precautions Precautions: Fall Required Braces or Orthoses DO NOT USE: No Other Brace/Splint: Consult for AFO for LLE but currently doesn't have one. Restrictions Weight Bearing Restrictions: Yes LLE Weight Bearing: Touchdown weight bearing Other Position/Activity Restrictions: 20 # weight bearing limit Pain: Pain Assessment Pain Score:   7 Pain Location: Hip Pain Orientation: Left Pain Intervention(s): Medication (See eMAR) See FIM for current functional status  Therapy/Group: Individual Therapy  Georges Mouse 09/08/2011, 2:47 PM

## 2011-09-08 NOTE — Progress Notes (Signed)
Patient ID: [name], male   DOB: 1965-09-22, 46 y.o.   MRN: [id number] Patient ID: [name], male   DOB: [birth year], 46 y.o.   MRN: [id number] Patient ID: [name], male   DOB: Oct 20, 1965, 46 y.o.   MRN: [id number]  Subjective/Complaints: Review of Systems  Respiratory: Negative for cough.   Cardiovascular: Negative for chest pain.  Musculoskeletal: Positive for back pain and joint pain.  Neurological: Positive for sensory change and focal weakness.  4-11: pain improving. Had a good day yesterday  Objective: Vital Signs: Blood pressure 87/52, pulse 82, temperature 98.7 F (37.1 C), temperature source Oral, resp. rate 18, height 6' (1.829 m), weight 167.4 kg (369 lb 0.8 oz), SpO2 90.00%. No results found.  Basename 09/08/11 0635 09/06/11 0552  WBC 9.1 8.8  HGB 8.2* 7.9*  HCT 26.1* 24.9*  PLT 259 259    Basename 09/06/11 0552  NA 136  K 4.5  CL 98  CO2 27  GLUCOSE 86  BUN 58*  CREATININE 5.80*  CALCIUM 9.1   CBG (last 3)  No results found for this basename: GLUCAP:3 in the last 72 hours  Wt Readings from Last 3 Encounters:  09/08/11 167.4 kg (369 lb 0.8 oz)  09/01/11 151.8 kg (334 lb 10.5 oz)  09/01/11 151.8 kg (334 lb 10.5 oz)    Physical Exam:  General appearance: alert, cooperative, moderate distress and severe distress Head: Normocephalic, without obvious abnormality, atraumatic Eyes: conjunctivae/corneas clear. PERRL, EOM's intact. Fundi benign. Ears: normal TM's and external ear canals both ears Nose: Nares normal. Septum midline. Mucosa normal. No drainage or sinus tenderness. Throat: lips, mucosa, and tongue normal; teeth and gums normal Neck: no adenopathy, no carotid bruit, no JVD, supple, symmetrical, trachea midline and thyroid not enlarged, symmetric, no tenderness/mass/nodules Back: back tender with palpation and minmal movement of legs Resp: clear to auscultation bilaterally Cardio: regular rate and rhythm, S1, S2 normal, no murmur,  click, rub or gallop GI: soft, non-tender; bowel sounds normal; no masses,  no organomegaly Extremities: extremities normal, atraumatic, no cyanosis or edema Pulses: 2+ and symmetric Skin: breakdown superificial stage 2 along lower gluteal folds Neurologic: ongoing ADF, APF weakness lle, has EHL weakness. Sensory loss in those areas also, cognitvely intact Incision/Wound: draining abdominal area with dressing (chronic) which is unchanged. Sacral wounds still stage 2. Left hip wound clean with staples out. 4/11exam  Assessment/Plan: 1. Functional deficits secondary to left intertrochanteric hip fx, morbid obesity, chronic lumbar radiculopathy which require 3+ hours per day of interdisciplinary therapy in a comprehensive inpatient rehab setting. Physiatrist is providing close team supervision and 24 hour management of active medical problems listed below. Physiatrist and rehab team continue to assess barriers to discharge/monitor patient progress toward functional and medical goals. FIM: FIM - Bathing Bathing Steps Patient Completed: Chest;Right Arm;Left Arm;Abdomen;Front perineal area;Buttocks;Right upper leg;Left upper leg Bathing: 4: Min-Patient completes 8-9 38f 10 parts or 75+ percent  FIM - Upper Body Dressing/Undressing Upper body dressing/undressing steps patient completed: Thread/unthread right sleeve of pullover shirt/dresss;Thread/unthread left sleeve of pullover shirt/dress;Put head through opening of pull over shirt/dress;Pull shirt over trunk Upper body dressing/undressing: 5: Set-up assist to: Obtain clothing/put away FIM - Lower Body Dressing/Undressing Lower body dressing/undressing steps patient completed: Thread/unthread right pants leg;Pull pants up/down;Don/Doff right sock Lower body dressing/undressing: 3: Mod-Patient completed 50-74% of tasks  FIM - Toileting Toileting: 0: Activity did not occur     FIM - Banker Devices:  Sliding board Bed/Chair Transfer: 3: Bed >  Chair or W/C: Mod A (lift or lower assist);3: Sit > Supine: Mod A (lifting assist/Pt. 50-74%/lift 2 legs) (Lifting lower extremities into the bed.)  FIM - Locomotion: Wheelchair Locomotion: Wheelchair: 6: Travels 150 ft or more, turns around, maneuvers to table, bed or toilet, negotiates 3% grade: maneuvers on rugs and over door sills independently FIM - Locomotion: Ambulation Locomotion: Ambulation: 0: Activity did not occur (unsafe to attempt)  Comprehension Comprehension Mode: Auditory Comprehension: 7-Follows complex conversation/direction: With no assist  Expression Expression Mode: Verbal Expression: 7-Expresses complex ideas: With no assist  Social Interaction Social Interaction: 6-Interacts appropriately with others with medication or extra time (anti-anxiety, antidepressant).  Problem Solving Problem Solving: 7-Solves complex problems: Recognizes & self-corrects  Memory Memory: 7-Complete Independence: No helper 1. left intertrochanteric hip fracture after a fall. Status post ORIF with hip screw  2. DVT Prophylaxis/Anticoagulation: Chronic Coumadin for history of DVT. Monitor for any signs of bleeding  3. Pain Management: Increased OxyContin to 60 mg every 12 hours. Aggressive muscle relaxants. Added oxy ir 15mg   -moving away from iv dilaudid. Will decrease frequency to q8prn today.  He does seem to be making improvement.  -limited pain coping skills 4. End-stage renal disease. Hemodialysis as per [facility]  5. Morbid obesity. Status post gastric sleeve procedure 2 years ago.   6. Depression. Lexapro and Xanax. Provide emotional support and positive reinforcement. 7. Anemia. Patient has been transfused. Continue Aranesp. Followup CBC with dialysis. He gets transfusions w/dialysis prn. hgb 8.2 today 8. Tobacco abuse. NicoDerm patch  9. History of VRE. Contact precautions  10. Chronic left foot drop: AFO was ordered  08/30/11. We'll discuss with therapy team. He will need a heavy-duty brace to accommodate his weight. Likely a double upright brace will be needed at some point but will hold off given his non-ambulatory status.   LOS (Days) 7 A FACE TO FACE EVALUATION WAS PERFORMED  Agusta Hackenberg T 09/08/2011, 7:44 AM

## 2011-09-08 NOTE — Progress Notes (Signed)
ANTICOAGULATION CONSULT NOTE - Follow Up Consult  Pharmacy Consult for Coumadin Indication: Hx DVT  Allergies  Allergen Reactions  . Zosyn Hives    Patient Measurements: Height: 6' (182.9 cm) Weight: 369 lb 0.8 oz (167.4 kg) IBW/kg (Calculated) : 77.6   Vital Signs: Temp: 98.7 F (37.1 C) (04/11 0629) Temp src: Oral (04/11 0629) BP: 87/52 mmHg (04/11 0629) Pulse Rate: 82  (04/11 0629)  Labs:  Basename 09/08/11 0635 09/07/11 0510 09/06/11 0552  HGB 8.2* -- 7.9*  HCT 26.1* -- 24.9*  PLT 259 -- 259  APTT -- -- --  LABPROT 29.7* 28.9* 31.5*  INR 2.77* 2.67* 2.99*  HEPARINUNFRC -- -- --  CREATININE -- -- 5.80*  CKTOTAL -- -- --  CKMB -- -- --  TROPONINI -- -- --   Estimated Creatinine Clearance: 26.4 ml/min (by C-G formula based on Cr of 5.8).  Assessment: Patient is a 46 y.o M on coumadin for Hx DVT. INR 2.77 (home dose 13mg /d?)  Cardiovascular: Hx HTN. But BP running low 87/52 to 90/57.   Endocrinology: Hx DM; CBGs on BMET at goal  Gastrointestinal / Nutrition: morbid obesity (gastic bypass with gastric sleeve), hx GERD, s/p cholecystectomy. PO PPI  Neurology: Pain On Oxycontin 60mg  BID (inc 4/8) + scheduled Robaxin and prn Oxy IR, diaudid, Norco. Also on lexapro  Nephrology: ESRD on HD TThS. Phos 5.4, corr Ca~10.2. On sevelamer  Hematology / Oncology: Anemia of CKD. Hgb 7.9. Tsat~20% (at goal). On aranesp (Thurs)    Goal of Therapy:  INR 2-3   Plan:  1. Warfarin 10 mg x 1 dose at 1800 today 2. Will continue to monitor for any signs/symptoms of bleeding and will follow up with PT/INR in the a.m.   Dirk Vanaman S. Merilynn Finland, PharmD, [facility] Clinical Staff Pharmacist Pager [phone] 09/08/2011 8:39 AM

## 2011-09-08 NOTE — Progress Notes (Signed)
Physical Therapy Session Note  Patient Details  Name: [name] MRN: [id number] Date of Birth: 1965-07-20  Today's Date: 09/08/2011 Time: 1191-4782 Time Calculation (min): 43 min  Skilled Therapeutic Interventions/Progress Updates:   Pt not wanting to get up until he performed his exercises.  Performed 15 reps of hip ab/adduction, quad sets, heel slides, qlut sets with AAROM.  PROM stretching to the left heel cord.  Had pt direct therapist how to assist with transfer into w/c with sliding board, performed with mod@ of therapist pulling on pt's pants to pull him across the board as directed. Discussed with pt options for treatment in pm.  Pt with limited goals and ability to perform activities.  Therapy Documentation Precautions:  Precautions Precautions: Fall Required Braces or Orthoses DO NOT USE: No Other Brace/Splint: Consult for AFO for LLE but currently doesn't have one. Restrictions Weight Bearing Restrictions: Yes LLE Weight Bearing: Touchdown weight bearing Other Position/Activity Restrictions: 20 # weight bearing limit Pain: Pain Assessment Pain Score:   8 (pt reports he is almost always at a 7 or 8) Faces Pain Scale: Hurts a little bit See FIM for current functional status  Therapy/Group: Individual Therapy  Georges Mouse 09/08/2011, 12:00 PM

## 2011-09-08 NOTE — Progress Notes (Signed)
Occupational Therapy Note  Patient Details  Name: [name] MRN: [id number] Date of Birth: 20-Mar-1966 Today's Date: 09/08/2011  1st session Time: 0800-0845 Pain: 9 in left upper leg and left lower back, RN aware Individual therapy  Pt participated in ADLs (bathing and dressing) at bed level per pt request.  Pt declined to complete tasks at EOB stating he would bathe and dress at bed level at home with wife assisting.  Pt required assistance with threading left pants leg and donning left sock.  Pt rolled side to side using bed rails to pull up pants.  Focus on bed mobility and activity tolerance.   2nd Session Time: 1130-1200 Pain: * in left upper leg and left lower back, RN aware Individual Therapy Pt engaged in arm bike exercises while seated in chair.  Pt completed 5 X 3 mins.  Pt declined to practice sit to stand from chair. Pt stated he didn't want to "take a chance."  Focus on increased endurance/upper body strength to assist with mobility.   Lavone Neri [facility] 09/08/2011, 2:48 PM

## 2011-09-08 NOTE — Progress Notes (Signed)
[facility]  Subjective:  [name] says he'll be going home on Saturday!  We'll contact [facility] ([name]) for f/u   Objective: Vital signs in last 24 hours: Blood pressure 87/52, pulse 82, temperature 98.7 F (37.1 C), temperature source Oral, resp. rate 18, height 6' (1.829 m), weight 167.4 kg (369 lb 0.8 oz), SpO2 90.00%.    PHYSICAL EXAM General--awake, alert, participating in PT Chest--clear Heart--no rub Abd--nontender Extr--AVF patent L forearm  Lab Results:   Lab 09/06/11 0552 09/03/11 1842  NA 136 134*  K 4.5 4.3  CL 98 95*  CO2 27 25  BUN 58* 57*  CREATININE 5.80* 5.86*  ALB -- --  GLUCOSE 86 --  CALCIUM 9.1 8.9  PHOS 5.4* 4.8*     Basename 09/08/11 0635 09/06/11 0552  WBC 9.1 8.8  HGB 8.2* 7.9*  HCT 26.1* 24.9*  PLT 259 259   Iron/TIBC/Ferritin Fe/TIBC 20% sat, ferritin 696  Scheduled: Continuous:   Assessment/Plan: 1. Hip fx on L. S/P ORIF--on coumadin, receiving PT  2. ESRD EDW usually 361 lb. Post HD weight Saturday was 367 lb. Will try to lower wt if BP will allow .PTH 80.2. On no Vit D  3. Anemia-- Fe studies show he can use IV Fe. Increase  Arasesp 100/wk once IV Fe given  4. High BP--on no meds now     LOS: 7 days   Lindsi Bayliss F 09/08/2011,1:53 PM   .labalb

## 2011-09-09 DIAGNOSIS — E669 Obesity, unspecified: Secondary | ICD-10-CM

## 2011-09-09 DIAGNOSIS — M47817 Spondylosis without myelopathy or radiculopathy, lumbosacral region: Secondary | ICD-10-CM

## 2011-09-09 DIAGNOSIS — N189 Chronic kidney disease, unspecified: Secondary | ICD-10-CM

## 2011-09-09 DIAGNOSIS — W19XXXA Unspecified fall, initial encounter: Secondary | ICD-10-CM

## 2011-09-09 DIAGNOSIS — S72143A Displaced intertrochanteric fracture of unspecified femur, initial encounter for closed fracture: Secondary | ICD-10-CM

## 2011-09-09 DIAGNOSIS — Z5189 Encounter for other specified aftercare: Secondary | ICD-10-CM

## 2011-09-09 LAB — PROTIME-INR: Prothrombin Time: 23.3 seconds — ABNORMAL HIGH (ref 11.6–15.2)

## 2011-09-09 MED ORDER — WARFARIN SODIUM 10 MG PO TABS: 10.0000 mg | ORAL_TABLET | Freq: Once | ORAL | Status: DC

## 2011-09-09 MED ORDER — WARFARIN SODIUM 7.5 MG PO TABS
15.0000 mg | ORAL_TABLET | Freq: Once | ORAL | Status: AC
  Administered 2011-09-09: 15 mg via ORAL
  Filled 2011-09-09: qty 2

## 2011-09-09 MED ORDER — OXYCODONE HCL 15 MG PO TABS: 15.0000 mg | ORAL_TABLET | ORAL | Status: AC | PRN

## 2011-09-09 MED ORDER — HEPARIN SODIUM (PORCINE) 1000 UNIT/ML DIALYSIS
20.0000 [IU]/kg | INTRAMUSCULAR | Status: DC | PRN
  Administered 2011-09-10: 3300 [IU] via INTRAVENOUS_CENTRAL
  Filled 2011-09-09: qty 4

## 2011-09-09 MED ORDER — OXYCODONE HCL 60 MG PO TB12: 60.0000 mg | ORAL_TABLET | Freq: Two times a day (BID) | ORAL | Status: DC

## 2011-09-09 MED ORDER — NICOTINE 21 MG/24HR TD PT24: 10.0000 | MEDICATED_PATCH | Freq: Every day | TRANSDERMAL | Status: AC

## 2011-09-09 NOTE — Discharge Summary (Signed)
  Discharge summary job [phone]

## 2011-09-09 NOTE — Progress Notes (Signed)
Occupational Therapy Discharge Summary and Session Notes  Patient Details  Name: [name] MRN: [id number] Date of Birth: 06-29-1965  Today's Date: 09/09/2011  1st Session: Time: 4098-1191 Pain: 8/10 in left upper leg and lower back, RN aware Individual Therapy  Pt completed all bathing and dressing tasks at bed level.  Pt rolls side to side using side rails to facilitate pulling up pants.  Pt uses bed rails and trapeze to assist with sitting upright in bed to don sock and thread pants leg on right leg.  Pt requires assistance with left pants leg and sock.  Pt declined to use sock aide or reacher.  Focus on bed mobility and activity tolerance.   2nd Session: Time: 1130-1200 Pain: 7/10 in left leg and lower back, pt stated he had just received medication.  Pt in w/c with family present.  Pt engaged in therex with [employer].  Discussed discharge plans with pt and wife.  Both stated they feel comfortable with pt d/c on 4/13.   Discharge Summary Patient has met 6 of 6 long term goals due to improved activity tolerance, improved balance and functional use of  LEFT lower extremity.  Pt completes all bathing and dressing tasks at bed level and stated that he will continue to do so after discharge with wife assisting.  Pt is min A for bathing and LB dressing, mod A for toileting using bed pan, supervision/setup for UB dressing, and mod I for grooming.  Wife has been present for therapy and is independent with providing necessary care/assistance. Patient to discharge at [facility] Assist level.  Patient's care partner is independent to provide the necessary physical assistance at discharge. Pt and his wife have worked out a system to perform selfcare and toileting tasks at a bed level.  Feel with increased activity and home health OT pt may be able to advance to sit to stand for some aspects of selfcare if he is motivated to try.    Recommendation:  Patient will benefit from ongoing skilled  OT services in home health setting to continue to advance functional skills in the area of BADL.  Equipment: No equipment provided  Reasons for discharge: treatment goals met and discharge from hospital  Patient/family agrees with progress made and goals achieved: Yes  OT Discharge Precautions/Restrictions  Precautions Other Brace/Splint: uses PRAFO at night Restrictions Weight Bearing Restrictions: Yes LLE Weight Bearing: Touchdown weight bearing Other Position/Activity Restrictions: 20# left leg   Pain Pain Assessment Pain Assessment: 0-10 Pain Score:   8 Pain Type: Chronic pain;Acute pain Pain Location: Hip Pain Orientation: Left Pain Descriptors: Dull;Stabbing Pain Onset: On-going Patients Stated Pain Goal: 2 Pain Intervention(s): Medication (See eMAR);Repositioned ADL ADL Equipment Provided: Reacher;Sock aid Eating: Independent Where Assessed-Eating: Bed level Grooming: Modified independent Where Assessed-Grooming: Sitting at sink;Wheelchair Upper Body Bathing: Setup Where Assessed-Upper Body Bathing: Bed level Lower Body Bathing: Minimal assistance Where Assessed-Lower Body Bathing: Bed level Upper Body Dressing: Supervision/safety Where Assessed-Upper Body Dressing: Bed level Lower Body Dressing: Minimal assistance Where Assessed-Lower Body Dressing: Bed level Toileting: Moderate assistance Where Assessed-Toileting: Bed level Toilet Transfer: Moderate assistance Toilet Transfer Method:  (rolling for simulated bedpan.) Tub/Shower Transfer: Not assessed Vision/Perception  Vision - History Baseline Vision: Wears contacts Patient Visual Report: No change from baseline Vision - Assessment Eye Alignment: Within Functional Limits  Cognition Overall Cognitive Status: Appears within functional limits for tasks assessed Arousal/Alertness: Awake/alert Orientation Level: Oriented X4 Attention: Divided Sensation Sensation Light Touch: Appears Intact Light  Touch Impaired Details:  (  pt states sensation is intact in LEs) Stereognosis: Appears Intact Hot/Cold: Appears Intact Proprioception: Appears Intact Coordination Gross Motor Movements are Fluid and Coordinated: Yes Fine Motor Movements are Fluid and Coordinated: Yes Motor  Motor Motor: Within Functional Limits Motor - Discharge Observations: generalized weakness and obesity. Mobility  Bed Mobility Bed Mobility: Yes Rolling Right: Not tested (comment) Rolling Left: Not tested (comment) Supine to Sit: 5: Supervision;With rails;HOB flat (with leg lifter) Supine to Sit Details: Visual cues/gestures for sequencing;Verbal cues for technique Sit to Supine: 5: Supervision;HOB flat;With rail (with leg lifter) Sit to Supine - Details: Verbal cues for sequencing;Verbal cues for technique Transfers Sit to Stand: Not tested (comment) Stand to Sit: Not tested (comment)  Trunk/Postural Assessment  Cervical Assessment Cervical Assessment: Within Functional Limits Thoracic Assessment Thoracic Assessment: Within Functional Limits Lumbar Assessment Lumbar Assessment:  (no change from evaluation) Postural Control Postural Control: Within Functional Limits  Balance Balance Balance Assessed: No Static Sitting Balance Static Sitting - Balance Support: Feet unsupported Static Sitting - Level of Assistance: 7: Independent Dynamic Sitting Balance Dynamic Sitting - Level of Assistance: 6: Modified independent (Device/Increase time) Extremity/Trunk Assessment RUE Assessment RUE Assessment: Within Functional Limits LUE Assessment LUE Assessment: Within Functional Limits  See FIM for current functional status  [name] 09/09/2011, 12:02 PM  [name], OTR/L Pager number [phone] 09/09/2011

## 2011-09-09 NOTE — Progress Notes (Addendum)
Physical Therapy Discharge Summary  Patient Details  Name: [name] MRN: [id number] Date of Birth: September 03, 1965  Today's Date: 09/09/2011 Time: 5284-1324 Time Calculation (min): 45 min  Session 1:  Bed mobility supervision with max vc for techniques to bridge with rt. Leg and use of leg lifter supine to sit and sit to supine, sliding board transfer supervision with vc for weight shift and assistance to place board. wc mobility in power chair mod I 150' LAQ x 10 AAROM. vc needed for awareness of left leg position with exercise to improve alignment and to decrease pain. NS,PTA  Patient has met 4 of 4 long term goals due to decreased pain and ability to compensate for deficits.  Patient to discharge at a wheelchair level Supervision. For transfers but mod I once in power w/c.   Patient's care partner available to provide the necessary set up for sliding board placement at discharge. Pt requires a leg lifter for bed mobility and HEP. Pt declined working on standing and owns a power wc.  Reasons goals not met: N/A unsure of why cognition goal showing on epic screen but 4 th goal set by PT was for pt to perform his HEP with A.  Recommendation:  Patient will benefit from ongoing skilled PT services in home health setting to continue to advance safe functional mobility, address ongoing impairments in LLE strength, muscle endurance activity tolerance, pain managment, and minimize fall risk.  Equipment: leg lifter  Reasons for discharge: discharge from hospital  Patient/family agrees with progress made and goals achieved: Yes  PT Discharge Precautions/Restrictions Precautions Other Brace/Splint: uses PRAFO at night Restrictions Weight Bearing Restrictions: Yes LLE Weight Bearing: Touchdown weight bearing Other Position/Activity Restrictions: 20# left leg   Pain Pain Assessment Pain Assessment: 0-10 Pain Score:   8 Pain Type: Chronic pain;Acute pain Pain Location: Hip Pain  Orientation: Left Pain Descriptors: Dull;Stabbing Pain Onset: On-going Patients Stated Pain Goal: 2 Pain Intervention(s): Medication (See eMAR);Repositioned Vision/Perception  Vision - History Baseline Vision: Wears contacts Patient Visual Report: No change from baseline Vision - Assessment Eye Alignment: Within Functional Limits  Cognition Overall Cognitive Status: Appears within functional limits for tasks assessed Arousal/Alertness: Awake/alert Orientation Level: Oriented X4 Attention: Divided Sensation Sensation Light Touch: Appears Intact Light Touch Impaired Details:  (pt states sensation is intact in LEs) Stereognosis: Appears Intact Hot/Cold: Appears Intact Proprioception: Appears Intact Coordination Gross Motor Movements are Fluid and Coordinated: Yes Fine Motor Movements are Fluid and Coordinated: Yes Motor  Motor Motor: Within Functional Limits Motor - Discharge Observations: generalized weakness and obesity.  Mobility Bed Mobility Bed Mobility: Yes Rolling Right: Not tested (comment) Rolling Left: Not tested (comment) Supine to Sit: 5: Supervision;With rails;HOB flat (with leg lifter) Supine to Sit Details: Visual cues/gestures for sequencing;Verbal cues for technique Sit to Supine: 5: Supervision;HOB flat;With rail (with leg lifter) Sit to Supine - Details: Verbal cues for sequencing;Verbal cues for technique Transfers Sit to Stand: Not tested (comment) Stand to Sit: Not tested (comment) Lateral/Scoot Transfers: 5: Supervision Lateral/Scoot Transfer Details: Verbal cues for technique;Verbal cues for sequencing Lateral/Scoot Transfer Details (indicate cue type and reason): sliding board with assist toplace board and using leg lifter Locomotion  Ambulation Ambulation: No Gait Gait: No Stairs / Additional Locomotion Stairs: No Wheelchair Mobility Wheelchair Mobility: Yes Wheelchair Assistance: 6: Modified independent (Device/Increase time) Writer: Power Wheelchair Parts Management: Independent Distance: 150'  Trunk/Postural Assessment  Cervical Assessment Cervical Assessment: Within Functional Limits Thoracic Assessment Thoracic Assessment: Within Functional Limits Lumbar Assessment  Lumbar Assessment:  (no change from evaluation) Postural Control Postural Control: Within Functional Limits  Balance Balance Balance Assessed: No Static Sitting Balance Static Sitting - Balance Support: Feet unsupported Static Sitting - Level of Assistance: 7: Independent Dynamic Sitting Balance Dynamic Sitting - Level of Assistance: 6: Modified independent (Device/Increase time) Extremity Assessment      RLE Assessment RLE Assessment: Exceptions to [facility] (grossly 3-/5) LLE Assessment LLE Assessment: Exceptions to [facility] LLE Strength LLE Overall Strength Comments: grossly 2-/5  See FIM for current functional status  [name] 09/09/2011, 10:30 AM

## 2011-09-09 NOTE — Discharge Summary (Signed)
NAMESELSO, [name] NO.:  [id number]  MEDICAL RECORD NO.:  [id number]  LOCATION:  4009                         FACILITY:  [facility]  PHYSICIAN:  [name], M.D.DATE OF BIRTH:  12-30-1965  DATE OF ADMISSION:  09/01/2011 DATE OF DISCHARGE:  09/10/2011                              DISCHARGE SUMMARY   DISCHARGE DIAGNOSES: 1. Left intertrochanteric hip fracture with open reduction and     fixation. 2. Chronic Coumadin therapy for history of deep vein thrombosis.     Chronic pain management. 3. End-stage renal disease. 4. Morbid obesity. 5. Depression. 6. Anemia. 7. Tobacco abuse. 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 9. Chronic left footdrop.  HISTORY:  This is a 45 year old morbidly obese male, 337 pounds, with end-stage renal disease, history of chronic Coumadin therapy for deep vein thrombosis, as well as gastric sleeve procedure 2 years ago for obesity in [place], [place] as well as chronic left foot drop. The patient was admitted on March 27 after a fall at home sustaining a left intertrochanteric hip fracture.  His chronic Coumadin was held until an INR was less than 2.  Underwent left open reduction and fixation with hip screw on March 29th per Dr. Jerl Robles.  Advised nonweightbearing left lower extremity for 4-6 weeks.  The patient is having difficulty maintaining weightbearing status.  Chronic Coumadin therapy resume.  Patient on contact precautions for history of VRE. Dialysis therapy resumed as per [facility].  Noted anemia 7.2 on April 1st and transfused with dialysis.  The patient with chronic pain, presently on OxyContin 15 mg every 12 hours.  He had been on 30 mg twice daily prior to admission.  He was admitted for comprehensive rehab program.  PAST MEDICAL HISTORY:  See discharge diagnoses.  ALLERGIES:  ZOSYN.  SOCIAL HISTORY:  Lives with his wife.  Two-level home with a ramp to entrance.  Functional  history prior to admission was essentially nonambulatory.  He used a wheelchair.  He was able to stand, pivot, and ambulate short distances.  He does drive.  Functional status upon admission to rehab services was max assist for supine to sit, moderate assist sit to supine, ambulation not tested.  PHYSICAL EXAMINATION:  VITAL SIGNS:  Blood pressure 82/42, pulse 75, temperature 98.6, respirations 18. GENERAL:  This was an alert male, oriented x3.  Morbidly obese.  Staples intact of hip incision.  He had a chronic left foot drop. LUNGS:  Clear to auscultation. CARDIAC:  Regular rate and rhythm. ABDOMEN:  Soft, nontender.  Good bowel sounds.  REHABILITATION HOSPITAL COURSE:  The patient was admitted to inpatient rehab services with therapies initiated on a 3-hour daily basis consisting of physical therapy, occupational therapy, and rehabilitation nursing.  The following issues were addressed during the patient's rehabilitation stay.  Pertaining to [name] left intertrochanteric hip fracture, he had undergone open reduction and internal fixation with hip screw per Dr. Jerl Robles.  Surgical site healing nicely.  He was nonweightbearing.  Neurovascular sensation intact.  He remained on chronic Coumadin therapy with latest INR of 2.77, which was followed at the [facility] in [facility].  There were no bleeding issues. Chronic  pain management is OxyContin sustained release was slowly titrated up.  He was maintained for a short time on Dilaudid.  He was now on OxyContin 60 mg every 12 hours, as well as Robaxin scheduled 1000 mg 4 times daily, oxycodone 15 mg every 4 hours as needed for breakthrough pain.  He continued on hemodialysis as per [facility].  Plan was to resume care at [facility] in [facility]. The patient continued on Lexapro for history of depression with emotional support provided.  He did have a history of tobacco abuse.  I did discuss with him  all issues associated with nicotine products.  He continued on NicoDerm patch.  It was questionable if he will be compliant with these request to stopped smoking.  He is maintained on contact precautions for history of VRE.  He remained afebrile.  He was fitted with an AFO brace for chronic left foot drop.  The patient received weekly collaborative interdisciplinary team conferences to discuss estimated length of stay, family teaching, and any barriers to discharge.  He was minimal assist bed mobility, moderate assist lower body, bathing at bed level, moderate assist bed mobility, minimal assist transfers.  He was able to communicate his needs.  Full family teaching was completed with his wife, sister, as well as children.  He was to be discharged home on April 13 with ongoing therapies dictated as per [employer].  Latest labs showed a hemoglobin 8, hematocrit 25, WBC 10.1.  Sodium 134, potassium 4.5, BUN 47, creatinine 5.28.  DISCHARGE MEDICATIONS: 1. Xanax 1 mg 3 times daily as needed. 2. Aranesp weekly with dialysis. 3. Robaxin 1000 mg 4 times daily. 4. NicoDerm patch taper as advised. 5. Oxycodone immediate release 15 mg every 4 hours as needed for     breakthrough pain. 6. OxyContin sustained release 60 mg every 12 hours with over 2 weeks and then resume 30 mg every 12 hours as prior to admission    7. Protonix 40 mg daily. 8. Renvela 1600 mg 3 times daily. 9. Coumadin 10 mg daily with dose adjusted for INR 2.0-3.0.  DIET:  Renal diet.  SPECIAL INSTRUCTIONS:  Nonweightbearing to left lower extremity.  Check INR on Tuesday April 16 with dialysis at [facility] in [facility].  Follow up Dr. Marcene Corning in 2 weeks, Orthopedic Services. Call for appointment Dr. Daleen Bo as needed.     [name], P.A.   ______________________________ [name], M.D.    DA/MEDQ  D:  09/09/2011  T:  09/09/2011  Job:  [id number]  cc:   Lubertha Basque.  [name], M.D. Marina Gravel, MD [facility]

## 2011-09-09 NOTE — Progress Notes (Signed)
ANTICOAGULATION CONSULT NOTE - Follow Up Consult  Pharmacy Consult for Coumadin Indication: Hx DVT  Allergies  Allergen Reactions  . Zosyn Hives    Patient Measurements: Height: 6' (182.9 cm) Weight: 359 lb 2.1 oz (162.9 kg) IBW/kg (Calculated) : 77.6   Vital Signs: Temp: 97.9 F (36.6 C) (04/12 0500) Temp src: Oral (04/12 0500) BP: 123/65 mmHg (04/12 0500) Pulse Rate: 96  (04/12 0500)  Labs:  Basename 09/09/11 1135 09/08/11 1630 09/08/11 0635 09/07/11 0510  HGB -- 8.0* 8.2* --  HCT -- 25.0* 26.1* --  PLT -- 258 259 --  APTT -- -- -- --  LABPROT 23.3* -- 29.7* 28.9*  INR 2.03* -- 2.77* 2.67*  HEPARINUNFRC -- -- -- --  CREATININE -- 5.28* -- --  CKTOTAL -- -- -- --  CKMB -- -- -- --  TROPONINI -- -- -- --   Estimated Creatinine Clearance: 28.5 ml/min (by C-G formula based on Cr of 5.28).  Assessment: Patient is a 46 y.o M on coumadin for Hx DVT (home dose 13mg /d? Admit INR 3.12). INR down 2.03?   Cardiovascular: Hx HTN. But BP max 151/44 with HR 66-96  Endocrinology: Hx DM; CBGs on BMET at goal  Gastrointestinal / Nutrition: morbid obesity (gastic bypass with gastric sleeve), hx GERD, s/p cholecystectomy. PO PPI  Neurology: Pain On Oxycontin 60mg  BID (inc 4/8) + scheduled Robaxin and prn Oxy IR, diaudid, Norco. Also on lexapro  Nephrology: ESRD on HD TThS. Phos 4, corr Ca~10.2. On sevelamer  Hematology / Oncology: Anemia of CKD. Hgb 7.9. Tsat~20% (at goal). On aranesp (Thurs)    Goal of Therapy:  INR 2-3   Plan:  1. Warfarin 15 mg x 1 dose at 1800 today 2. Plan d/c Saturday. Recommend home on previous Coumadin 13mg /d.  Dwain Huhn S. Merilynn Finland, PharmD, [facility] Clinical Staff Pharmacist Pager [phone] 09/09/2011 12:37 PM

## 2011-09-09 NOTE — Progress Notes (Signed)
Social Work Discharge Note Discharge Note  The overall goal for the admission was met for:   Discharge location: Yes-HOME WITH WIFE AND SISTER TO PROVIDE CARE  Length of Stay: Yes-9 DAYS  Discharge activity level: Yes-SUPERVISION/MIN LEVEL  Home/community participation: Yes  Services provided included: MD, RD, PT, OT, RN, CM, TR, Pharmacy and SW  Financial Services: Medicare and Private Insurance: BCBS  Follow-up services arranged: [facility] Health: [facility] and Patient/Family request agency HH: [facility], DME: NO NEEDS  Comments (or additional information):FAMILY EDUCATION COMPLETED, READY FOR DISCHARGE  Patient/Family verbalized understanding of follow-up arrangements: Yes  Individual responsible for coordination of the follow-up plan: STEPHANIE-WIFE  Confirmed correct DME delivered: Lucy Chris 09/09/2011    Lucy Chris

## 2011-09-09 NOTE — Progress Notes (Signed)
Physical Therapy Note  Patient Details  Name: [name] MRN: [id number] Date of Birth: 08-09-1965 Today's Date: 09/09/2011  13:40-14:03 individual therapy 8/10 pain . Pt premedicated and ROM performed.  Performed LE AAROM for heel slides, SLR, hip abduction/adduction, SAQ x 10 each. Pt's wife was present and she was verbally educated on technique pt can use fir bed mobility to be supervision.   [name] 09/09/2011, 3:30 PM

## 2011-09-09 NOTE — Progress Notes (Signed)
[facility]  Subjective:  Hopeful to go home tomorrow.  Wife and son at bedside.  Dialysis scheduled "first round" tomorrow   Objective: Vital signs in last 24 hours: Blood pressure 91/58, pulse 85, temperature 98.6 F (37 C), temperature source Oral, resp. rate 20, height 6' (1.829 m), weight 162.9 kg (359 lb 2.1 oz), SpO2 94.00%.    PHYSICAL EXAM General--awake, alert Chest--clear Heart--no rub Abd--corpulent, nontender Extr--AVF patent L forearm  Lab Results:   Lab 09/08/11 1630 09/06/11 0552 09/03/11 1842  NA 134* 136 134*  K 4.5 4.5 4.3  CL 96 98 95*  CO2 24 27 25   BUN 47* 58* 57*  CREATININE 5.28* 5.80* 5.86*  ALB -- -- --  GLUCOSE 123* -- --  CALCIUM 9.1 9.1 8.9  PHOS 4.0 5.4* 4.8*     Basename 09/08/11 1630 09/08/11 0635  WBC 10.1 9.1  HGB 8.0* 8.2*  HCT 25.0* 26.1*  PLT 258 259     Assessment/Plan:  1. Hip fx on L. S/P ORIF--on coumadin, receiving PT  2. ESRD EDW usually 361 lb. Post HD weight Thurs was 359. Will try to lower wt if BP will allow .PTH 80.2. On no  Vit D, Renagel 1600 TID .  I stat in HD tomorrow 3. Anemia-- Fe studies show low Fe. Increase Arasesp 100/wk plus continue Nulecit 125 mg x 8 doses  LOS: 8 days   Jaimere Feutz F 09/09/2011,4:20 PM   .labalb

## 2011-09-09 NOTE — Discharge Instructions (Signed)
Inpatient Rehab Discharge Instructions  [name] Discharge date and time: No discharge date for patient encounter.   Activities/Precautions/ Functional Status: Activity: non weight bearing left leg Diet: renal diet Wound Care: keep wound clean and dry Functional status:  ___ No restrictions     ___ Walk up steps independently __x_ 24/7 supervision/assistance   ___ Walk up steps with assistance ___ Intermittent supervision/assistance  ___ Bathe/dress independently ___ Walk with walker     ___ Bathe/dress with assistance ___ Walk Independently    ___ Shower independently _x__ Walk with assistance    _x__ Shower with assistance ___ No alcohol     ___ Return to work/school ________  Special Instructions: Continue dialysis at  [facility] Check INR Tuesday, April 16 with dialysis Nonweightbearing left lower extremity  COMMUNITY REFERRALS UPON DISCHARGE:    Home Health:   PT,OT, RN    Agency:[facility] Phone:[phone] Date of last service:09/09/2011    Medical Equipment/Items Ordered:NONE HAD FROM PREVIOUS ADMITS My questions have been answered and I understand these instructions. I will adhere to these goals and the provided educational materials after my discharge from the hospital.  Patient/Caregiver Signature _______________________________ Date __________  Clinician Signature _______________________________________ Date __________  Please bring this form and your medication list with you to all your follow-up doctor's appointments.

## 2011-09-09 NOTE — Progress Notes (Signed)
Subjective/Complaints: Review of Systems  Respiratory: Negative for cough.   Cardiovascular: Negative for chest pain.  Musculoskeletal: Positive for back pain and joint pain.  Neurological: Positive for sensory change and focal weakness.  4-11: excited about dc tomorro  Objective: Vital Signs: Blood pressure 123/65, pulse 96, temperature 97.9 F (36.6 C), temperature source Oral, resp. rate 20, height 6' (1.829 m), weight 162.9 kg (359 lb 2.1 oz), SpO2 98.00%. No results found.  Basename 09/08/11 1630 09/08/11 0635  WBC 10.1 9.1  HGB 8.0* 8.2*  HCT 25.0* 26.1*  PLT 258 259    Basename 09/08/11 1630  NA 134*  K 4.5  CL 96  CO2 24  GLUCOSE 123*  BUN 47*  CREATININE 5.28*  CALCIUM 9.1   CBG (last 3)  No results found for this basename: GLUCAP:3 in the last 72 hours  Wt Readings from Last 3 Encounters:  09/08/11 162.9 kg (359 lb 2.1 oz)  09/01/11 151.8 kg (334 lb 10.5 oz)  09/01/11 151.8 kg (334 lb 10.5 oz)    Physical Exam:  General appearance: alert, cooperative, moderate distress and severe distress Head: Normocephalic, without obvious abnormality, atraumatic Eyes: conjunctivae/corneas clear. PERRL, EOM's intact. Fundi benign. Ears: normal TM's and external ear canals both ears Nose: Nares normal. Septum midline. Mucosa normal. No drainage or sinus tenderness. Throat: lips, mucosa, and tongue normal; teeth and gums normal Neck: no adenopathy, no carotid bruit, no JVD, supple, symmetrical, trachea midline and thyroid not enlarged, symmetric, no tenderness/mass/nodules Back: back tender with palpation and minmal movement of legs Resp: clear to auscultation bilaterally Cardio: regular rate and rhythm, S1, S2 normal, no murmur, click, rub or gallop GI: soft, non-tender; bowel sounds normal; no masses,  no organomegaly Extremities: extremities normal, atraumatic, no cyanosis or edema Pulses: 2+ and symmetric Skin: breakdown superificial stage 2 along lower gluteal  folds Neurologic: ongoing ADF, APF weakness lle, has EHL weakness. Sensory loss in those areas also, cognitvely intact Incision/Wound: draining abdominal area with dressing (chronic) which is unchanged. Sacral wounds still stage 2. Left hip wound clean with staples out. 4/12 exam  Assessment/Plan: 1. Functional deficits secondary to left intertrochanteric hip fx, morbid obesity, chronic lumbar radiculopathy which require 3+ hours per day of interdisciplinary therapy in a comprehensive inpatient rehab setting. Physiatrist is providing close team supervision and 24 hour management of active medical problems listed below. Physiatrist and rehab team continue to assess barriers to discharge/monitor patient progress toward functional and medical goals. FIM: FIM - Bathing Bathing Steps Patient Completed: Chest;Right Arm;Left Arm;Abdomen;Front perineal area;Buttocks;Right upper leg;Left upper leg Bathing: 4: Min-Patient completes 8-9 30f 10 parts or 75+ percent  FIM - Upper Body Dressing/Undressing Upper body dressing/undressing steps patient completed: Thread/unthread right sleeve of pullover shirt/dresss;Thread/unthread left sleeve of pullover shirt/dress;Put head through opening of pull over shirt/dress;Pull shirt over trunk Upper body dressing/undressing: 0: Wears gown/pajamas-no public clothing FIM - Lower Body Dressing/Undressing Lower body dressing/undressing steps patient completed: Thread/unthread right pants leg;Pull pants up/down;Don/Doff right sock;Don/Doff right shoe;Fasten/unfasten right shoe Lower body dressing/undressing: 3: Mod-Patient completed 50-74% of tasks  FIM - Toileting Toileting: 0: Activity did not occur     FIM - Banker Devices: Sliding board Bed/Chair Transfer: 4: Supine > Sit: Min A (steadying Pt. > 75%/lift 1 leg);3: Bed > Chair or W/C: Mod A (lift or lower assist)  FIM - Locomotion: Wheelchair Locomotion: Wheelchair: 0:  Activity did not occur FIM - Locomotion: Ambulation Locomotion: Ambulation: 0: Activity did not occur  Comprehension Comprehension  Mode: Auditory Comprehension: 7-Follows complex conversation/direction: With no assist  Expression Expression Mode: Verbal Expression: 7-Expresses complex ideas: With no assist  Social Interaction Social Interaction: 6-Interacts appropriately with others with medication or extra time (anti-anxiety, antidepressant).  Problem Solving Problem Solving: 7-Solves complex problems: Recognizes & self-corrects  Memory Memory: 7-Complete Independence: No helper 1. left intertrochanteric hip fracture after a fall. Status post ORIF with hip screw   -finalize planning for dc home 2. DVT Prophylaxis/Anticoagulation: Chronic Coumadin for history of DVT. Monitor for any signs of bleeding  3. Pain Management: Increased OxyContin to 60 mg every 12 hours. Aggressive muscle relaxants. Added oxy ir 15mg   -dc iv dilaudid.   -limited pain coping skills 4. End-stage renal disease. Hemodialysis as per [facility]  5. Morbid obesity. Status post gastric sleeve procedure 2 years ago.   6. Depression. Lexapro and Xanax. Provide emotional support and positive reinforcement. 7. Anemia. Patient has been transfused. Continue Aranesp. Followup CBC with dialysis. He gets transfusions w/dialysis prn. hgb 8.2 today 8. Tobacco abuse. NicoDerm patch  9. History of VRE. Contact precautions  10. Chronic left foot drop: AFO was ordered 08/30/11. We'll discuss with therapy team. He will need a heavy-duty brace to accommodate his weight. Likely a double upright brace will be needed at some point but will hold off given his non-ambulatory status.   LOS (Days) 8 A FACE TO FACE EVALUATION WAS PERFORMED  Ajeenah Heiny T 09/09/2011, 7:49 AM

## 2011-09-10 ENCOUNTER — Inpatient Hospital Stay (HOSPITAL_COMMUNITY): Payer: BC Managed Care – PPO

## 2011-09-10 DIAGNOSIS — N189 Chronic kidney disease, unspecified: Secondary | ICD-10-CM

## 2011-09-10 DIAGNOSIS — E669 Obesity, unspecified: Secondary | ICD-10-CM

## 2011-09-10 DIAGNOSIS — S72143A Displaced intertrochanteric fracture of unspecified femur, initial encounter for closed fracture: Secondary | ICD-10-CM

## 2011-09-10 DIAGNOSIS — W19XXXA Unspecified fall, initial encounter: Secondary | ICD-10-CM

## 2011-09-10 DIAGNOSIS — Z5189 Encounter for other specified aftercare: Secondary | ICD-10-CM

## 2011-09-10 DIAGNOSIS — M47817 Spondylosis without myelopathy or radiculopathy, lumbosacral region: Secondary | ICD-10-CM

## 2011-09-10 LAB — RENAL FUNCTION PANEL
Albumin: 2.6 g/dL — ABNORMAL LOW (ref 3.5–5.2)
Calcium: 9.3 mg/dL (ref 8.4–10.5)
GFR calc Af Amer: 17 mL/min — ABNORMAL LOW (ref >90)
GFR calc non Af Amer: 14 mL/min — ABNORMAL LOW (ref >90)
Glucose, Bld: 84 mg/dL (ref 70–99)
Phosphorus: 4 mg/dL (ref 2.3–4.6)
Sodium: 136 mEq/L (ref 135–145)

## 2011-09-10 LAB — CBC
MCH: 30.9 pg (ref 26.0–34.0)
MCHC: 31.9 g/dL (ref 30.0–36.0)
Platelets: 245 10*3/uL (ref 150–400)
RDW: 16.7 % — ABNORMAL HIGH (ref 11.5–15.5)

## 2011-09-10 LAB — PROTIME-INR: Prothrombin Time: 22.3 seconds — ABNORMAL HIGH (ref 11.6–15.2)

## 2011-09-10 MED ORDER — WARFARIN SODIUM 7.5 MG PO TABS
15.0000 mg | ORAL_TABLET | Freq: Once | ORAL | Status: DC
  Filled 2011-09-10: qty 2

## 2011-09-10 NOTE — Progress Notes (Signed)
[facility]  On HD via L forearm AVF BP--107/42 Goal--2.3 L Hgb--8.1  On Aranesp and IV Fe  K+--4.5 on 11 Apr  I called RN at [facility] to let her know current condition, meds, EDW, hosp course and that he'd be coming there on Tuesday.   They will check PT INR

## 2011-09-10 NOTE — Progress Notes (Signed)
ANTICOAGULATION CONSULT NOTE - Follow Up Consult  Pharmacy Consult for Coumadin Indication: Hx DVT  Allergies  Allergen Reactions  . Zosyn Hives    Patient Measurements: Height: 6' (182.9 cm) Weight: 362 lb 10.5 oz (164.5 kg) IBW/kg (Calculated) : 77.6   Vital Signs: Temp: 98.2 F (36.8 C) (04/13 0702) Temp src: Oral (04/13 0702) BP: 98/51 mmHg (04/13 0723) Pulse Rate: 66  (04/13 0723)  Labs:  Basename 09/09/11 1135 09/08/11 1630 09/08/11 0635  HGB -- 8.0* 8.2*  HCT -- 25.0* 26.1*  PLT -- 258 259  APTT -- -- --  LABPROT 23.3* -- 29.7*  INR 2.03* -- 2.77*  HEPARINUNFRC -- -- --  CREATININE -- 5.28* --  CKTOTAL -- -- --  CKMB -- -- --  TROPONINI -- -- --   Estimated Creatinine Clearance: 28.7 ml/min (by C-G formula based on Cr of 5.28).  Assessment: Patient is a 46 y.o M on coumadin for Hx DVT (home dose 13mg /d? Admit INR 3.12). INR down again to 1.92?   Cardiovascular: Hx HTN. VSS last 24h. No meds.  Endocrinology: Hx DM; CBGs on BMET at goal  Gastrointestinal / Nutrition: morbid obesity (gastic bypass with gastric sleeve), hx GERD, s/p cholecystectomy. PO PPI  Neurology: Pain On Oxycontin 60mg  BID (inc 4/8) + scheduled Robaxin and prn Oxy IR, Norco. Also on Lexapro, Foltx  Nephrology: ESRD on HD TThS. Phos 4, corr Ca~10.2. On sevelamer.   Hematology / Oncology: Anemia of CKD. Hgb Hgb 8.1 on Aranesp. F/u Aranesp dosing.  Goal of Therapy:  INR 2-3   Plan:  1. Warfarin 15 mg x 1 dose at 1800 today 2. Plan d/c Saturday. Recommend home on previous Coumadin 13mg /d.  Master Touchet S. Merilynn Finland, PharmD, [facility] Clinical Staff Pharmacist Pager [phone] 09/10/2011 7:47 AM

## 2011-09-10 NOTE — Plan of Care (Signed)
Problem: RH SKIN INTEGRITY Goal: RH STG ABLE TO PERFORM INCISION/WOUND CARE W/ASSISTANCE STG Able To Perform Incision/Wound Care With Max Assistance.  Outcome: Completed/Met Date Met:  09/10/11 Patient wife verbalized understanding of dressings and when to change

## 2011-09-10 NOTE — Progress Notes (Signed)
Patient ID: [name], male   DOB: [birth year], 46 y.o.   MRN: [id number]  Subjective/Complaints: Seen in HD, no c/os Review of Systems  Respiratory: Negative for cough.   Cardiovascular: Negative for chest pain.  Musculoskeletal: Positive for back pain and joint pain.  Neurological: Positive for sensory change and focal weakness.    Objective: Vital Signs: Blood pressure 98/52, pulse 70, temperature 98.2 F (36.8 C), temperature source Oral, resp. rate 18, height 6' (1.829 m), weight 164.5 kg (362 lb 10.5 oz), SpO2 94.00%. No results found.  Basename 09/10/11 0730 09/08/11 1630  WBC 7.7 10.1  HGB 8.1* 8.0*  HCT 25.4* 25.0*  PLT 245 258    Basename 09/08/11 1630  NA 134*  K 4.5  CL 96  CO2 24  GLUCOSE 123*  BUN 47*  CREATININE 5.28*  CALCIUM 9.1   CBG (last 3)  No results found for this basename: GLUCAP:3 in the last 72 hours  Wt Readings from Last 3 Encounters:  09/10/11 164.5 kg (362 lb 10.5 oz)  09/01/11 151.8 kg (334 lb 10.5 oz)  09/01/11 151.8 kg (334 lb 10.5 oz)    Physical Exam:  General appearance: alert, cooperative, moderate distress and severe distress Head: Normocephalic, without obvious abnormality, atraumatic Eyes: EOMI, clear Ears: ext nl Nose: Nares normal. Septum midline. Mucosa normal. No drainage or sinus tenderness. Throat: lips, mucosa, and tongue normal; teeth and gums normal Neck: no adenopathy, no carotid bruit, no JVD, supple, symmetrical, trachea midline and thyroid not enlarged, symmetric, no tenderness/mass/nodules Back: back tender with palpation and minmal movement of legs Resp: clear to auscultation bilaterally Cardio: regular rate and rhythm, S1, S2 normal, no murmur, click, rub or gallop GI: soft, non-tender; bowel sounds normal; no masses,  no organomegaly Extremities: extremities normal, atraumatic, no cyanosis or edema Pulses: 2+ and symmetric Skin: breakdown superificial stage 2 along lower gluteal folds Neurologic:  ongoing ADF, APF weakness lle, has EHL weakness. Sensory loss in those areas also, cognitvely intact   Assessment/Plan: 1. Functional deficits secondary to left intertrochanteric hip fx, morbid obesity, chronic lumbar radiculopathy stable for D/C FIM: FIM - Bathing Bathing Steps Patient Completed: Chest;Right Arm;Left Arm;Abdomen;Front perineal area;Right upper leg;Left upper leg;Right lower leg (including foot) Bathing: 4: Min-Patient completes 8-9 44f 10 parts or 75+ percent  FIM - Upper Body Dressing/Undressing Upper body dressing/undressing steps patient completed: Thread/unthread right sleeve of pullover shirt/dresss;Thread/unthread left sleeve of pullover shirt/dress;Put head through opening of pull over shirt/dress;Pull shirt over trunk Upper body dressing/undressing: 5: Set-up assist to: Obtain clothing/put away FIM - Lower Body Dressing/Undressing Lower body dressing/undressing steps patient completed: Thread/unthread right pants leg;Pull pants up/down;Don/Doff right sock;Don/Doff right shoe;Fasten/unfasten right shoe Lower body dressing/undressing: 4: Min-Patient completed 75 plus % of tasks  FIM - Toileting Toileting steps completed by patient: Adjust clothing prior to toileting;Adjust clothing after toileting Toileting: 3: Mod-Patient completed 2 of 3 steps     FIM - Banker Devices: Sliding board;Bed rails (trapeze, leg lifter) Bed/Chair Transfer: 5: Supine > Sit: Supervision (verbal cues/safety issues);5: Sit > Supine: Supervision (verbal cues/safety issues);5: Bed > Chair or W/C: Supervision (verbal cues/safety issues);5: Chair or W/C > Bed: Supervision (verbal cues/safety issues)  FIM - Locomotion: Wheelchair Distance: 150' Locomotion: Wheelchair: 6: Travels 150 ft or more, turns around, maneuvers to table, bed or toilet, negotiates 3% grade: maneuvers on rugs and over door sills independently FIM - Locomotion:  Ambulation Locomotion: Ambulation: 0: Activity did not occur  Comprehension Comprehension Mode: Auditory Comprehension:  7-Follows complex conversation/direction: With no assist  Expression Expression Mode: Verbal Expression: 7-Expresses complex ideas: With no assist  Social Interaction Social Interaction: 7-Interacts appropriately with others - No medications needed.  Problem Solving Problem Solving: 7-Solves complex problems: Recognizes & self-corrects  Memory Memory: 7-Complete Independence: No helper 1. left intertrochanteric hip fracture after a fall. Status post ORIF with hip screw   -finalize planning for dc home 2. DVT Prophylaxis/Anticoagulation: Chronic Coumadin for history of DVT. Monitor for any signs of bleeding  3. Pain Management: Increased OxyContin to 60 mg every 12 hours. Aggressive muscle relaxants. Added oxy ir 15mg   -dc iv dilaudid.   -limited pain coping skills 4. End-stage renal disease. Hemodialysis as per [facility]  5. Morbid obesity. Status post gastric sleeve procedure 2 years ago.   6. Depression. Lexapro and Xanax. Provide emotional support and positive reinforcement. 7. Anemia. Patient has been transfused. Continue Aranesp. Followup CBC with dialysis. He gets transfusions w/dialysis prn. hgb 8.2 today 8. Tobacco abuse. NicoDerm patch  9. History of VRE. Contact precautions  10. Chronic left foot drop: AFO was ordered 08/30/11. We'll discuss with therapy team. He will need a heavy-duty brace to accommodate his weight. Likely a double upright brace will be needed at some point but will hold off given his non-ambulatory status.   LOS (Days) 9 A FACE TO FACE EVALUATION WAS PERFORMED  Astryd Pearcy E 09/10/2011, 8:14 AM

## 2011-09-10 NOTE — Progress Notes (Addendum)
Patient brought back from Dialysis around 1130. Patient graft in LUA positive bruit and thrill. Vital signs stable. Patient alert and oriented x3. Patient discharged home about 1230 with wife and all discharge instructions via Harvel Ricks PA. Patient denied any questions about discharge instructions. Incision to left hip dressing changed. Patient wife verbalized signs and symptoms of infection to look for. Patient wife changed abdominal dressing. Patient bottom excoriated red and scabbed. allevyn dressings in place over stage II. Patient given scheduled pain medication before discharge. Patient rode personal motorized wheelchair out.  Patient did not receive Coumadin before discharge. Dr Wynn Banker notified of pharmacy's recommendation of higher coumadin dose to go home. Trying to contact patient to determine pharmacy they use. Message left on patient's spouses's cell. Pharmacy recommended coumadin to be up to 13mg  at home. Dr Doroteo Bradford notified and reported ok for patient to take 13mg  of coumadin daily at home. Spoke with patient's wife over the phone and told her about changing to 13mg  of coumadin. Patient's wife verbalized understanding.

## 2011-09-22 ENCOUNTER — Telehealth: Payer: Self-pay | Admitting: Physical Medicine & Rehabilitation

## 2011-09-22 NOTE — Telephone Encounter (Signed)
Patient is out of Oxycontin.  Going back and forth to dialysis and is in pain.

## 2011-09-22 NOTE — Telephone Encounter (Signed)
He was given this medication when he left the hospital. [name] gave him Oxycodone 60mg  1 Q 12hrs #28. Do you want to refill this?

## 2011-09-23 NOTE — Telephone Encounter (Signed)
He was advised to follow up with the md who had managed his pain before the new injury. [name] had told me he had contacted that MD.

## 2011-09-26 NOTE — Telephone Encounter (Signed)
[name] is aware to call other treating MD.

## 2011-09-29 ENCOUNTER — Other Ambulatory Visit: Payer: Self-pay | Admitting: Orthopaedic Surgery

## 2011-09-29 ENCOUNTER — Ambulatory Visit (HOSPITAL_COMMUNITY)
Admission: RE | Admit: 2011-09-29 | Discharge: 2011-09-29 | Disposition: A | Discharge status: Home / Self Care | Payer: BC Managed Care – PPO | Source: Ambulatory Visit | Attending: Orthopaedic Surgery | Admitting: Orthopaedic Surgery

## 2011-09-29 DIAGNOSIS — M25559 Pain in unspecified hip: Secondary | ICD-10-CM

## 2011-09-29 DIAGNOSIS — Z4789 Encounter for other orthopedic aftercare: Secondary | ICD-10-CM | POA: Insufficient documentation

## 2011-11-21 ENCOUNTER — Ambulatory Visit (HOSPITAL_COMMUNITY)
Admission: RE | Admit: 2011-11-21 | Discharge: 2011-11-21 | Disposition: A | Discharge status: Home / Self Care | Payer: BC Managed Care – PPO | Source: Ambulatory Visit | Attending: Orthopaedic Surgery | Admitting: Orthopaedic Surgery

## 2011-11-21 ENCOUNTER — Other Ambulatory Visit (HOSPITAL_COMMUNITY): Payer: Self-pay | Admitting: Orthopaedic Surgery

## 2011-11-21 DIAGNOSIS — M25559 Pain in unspecified hip: Secondary | ICD-10-CM

## 2011-11-21 DIAGNOSIS — M949 Disorder of cartilage, unspecified: Secondary | ICD-10-CM | POA: Insufficient documentation

## 2011-11-21 DIAGNOSIS — M899 Disorder of bone, unspecified: Secondary | ICD-10-CM | POA: Insufficient documentation

## 2012-02-03 IMAGING — CR DG CHEST 1V PORT
1 series · 1 of 1 positions shown · non-contrast
Comparison: Portable chest x-ray of 10/17/2010

CLINICAL DATA: Short of breath, cough

PORTABLE CHEST - 1 VIEW

[AP]
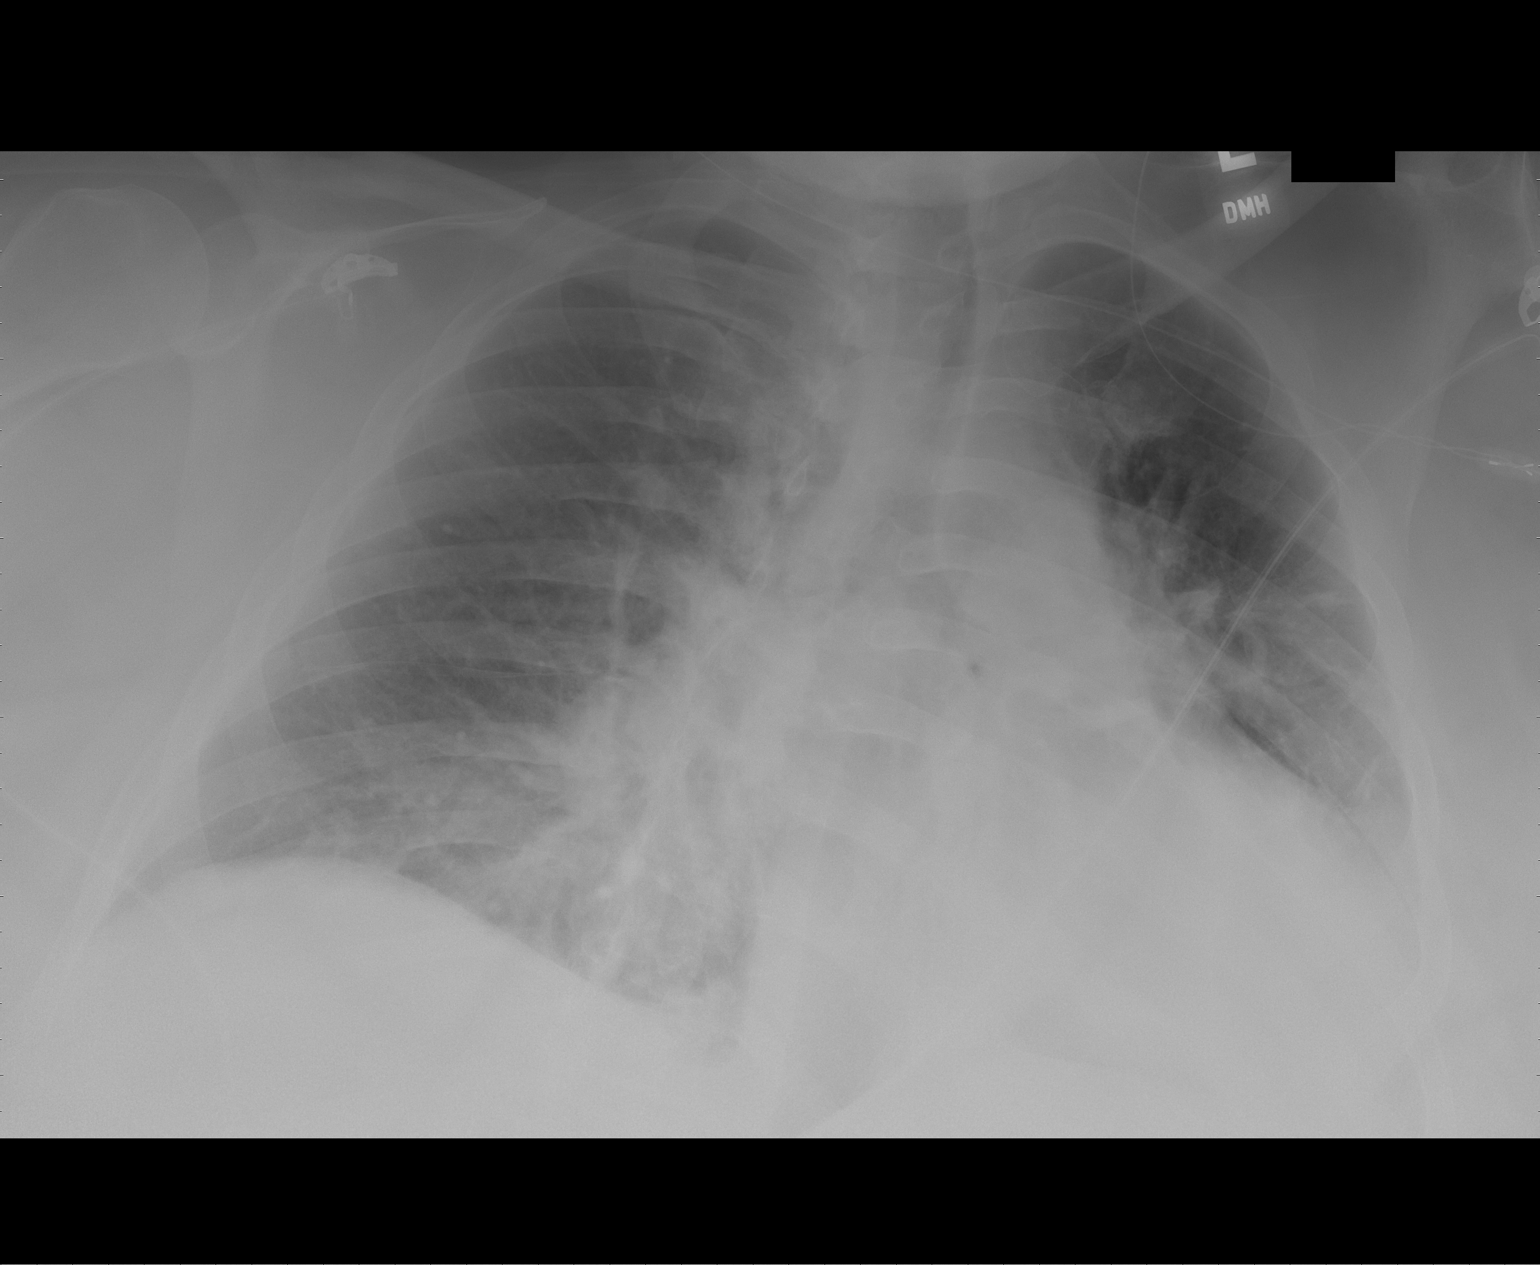

[1 of 1 positions shown; findings below may reference images not displayed]

FINDINGS: Aeration of the left lung base has improved but there is
still opacity present suspicious for pneumonia.  Mild atelectasis
at the right lung base is noted.  The patient is once again is
rotated on the current film.  Cardiomegaly is stable.
IMPRESSION: Somewhat improved aeration of left lung base but persistent opacity
consistent with atelectasis or pneumonia.

## 2012-03-03 IMAGING — CR DG TMJ OPEN & CLOSE UNILATERAL*R*
4 series · 4 of 4 positions shown · non-contrast
Comparison: None.

CLINICAL DATA: Right-sided jaw pain.

RIGHT UNILATERAL TEMPOROMANDIBULAR JOINTS

[x skull lat (1 of 2)]
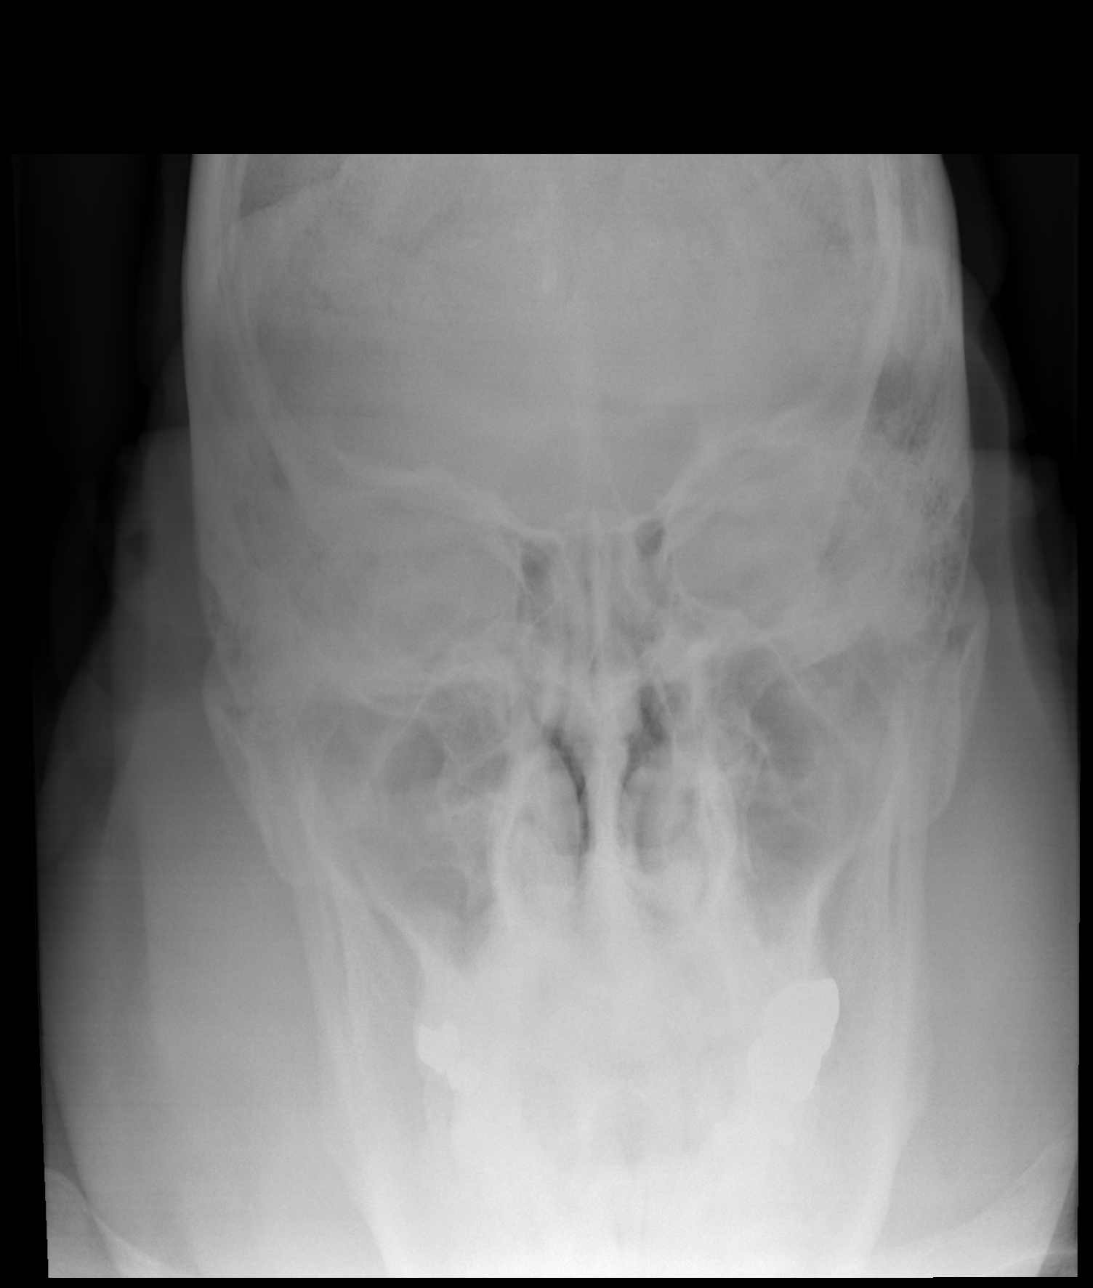

[x skull lat (2 of 2)]
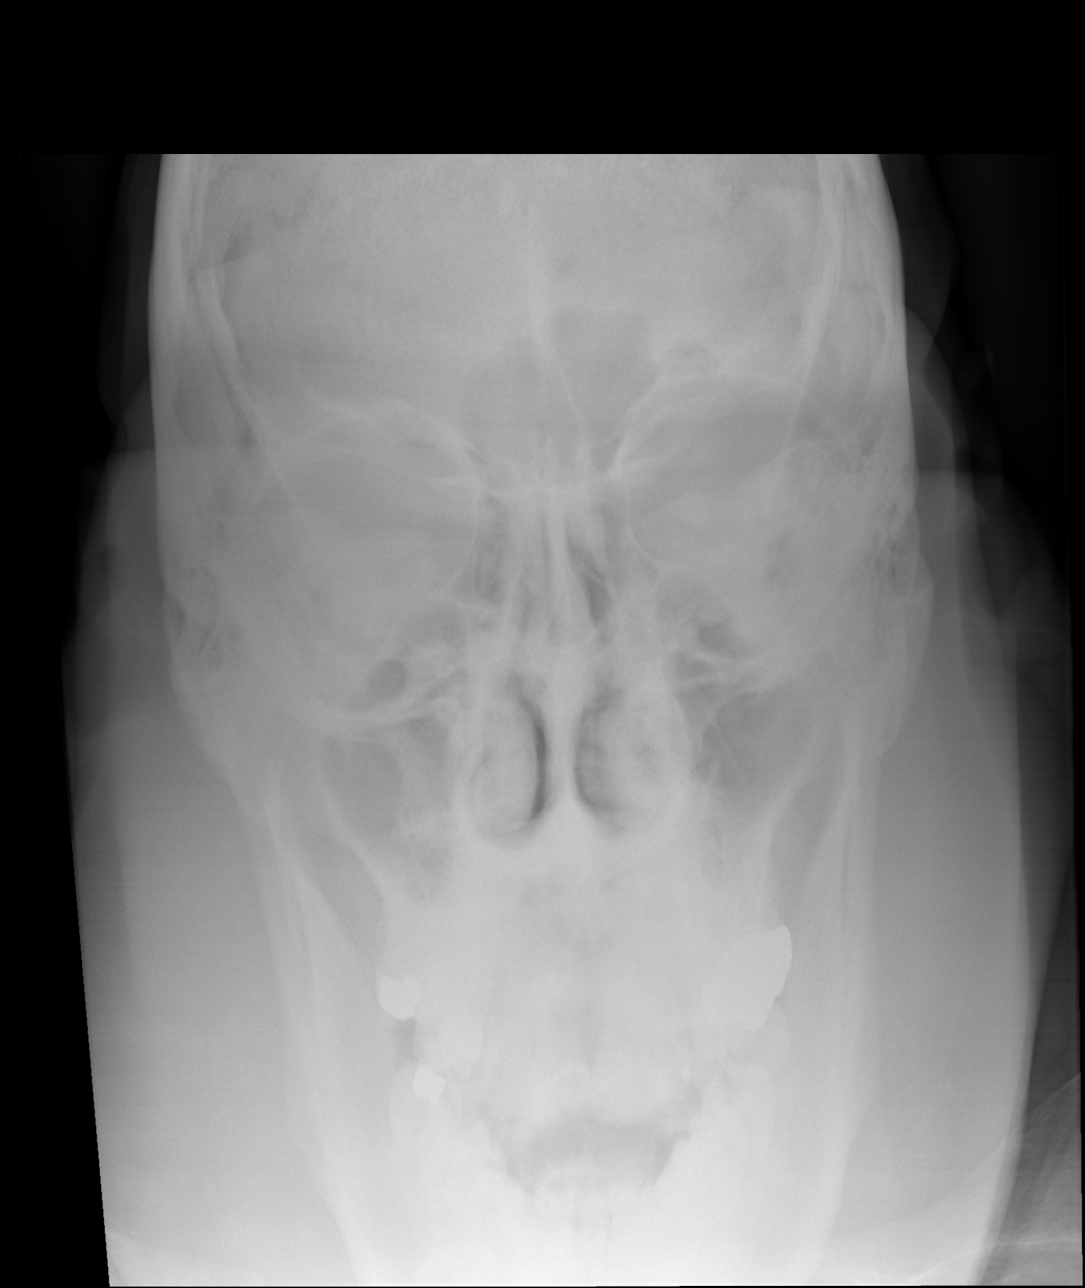

[w skull lat (1 of 2)]
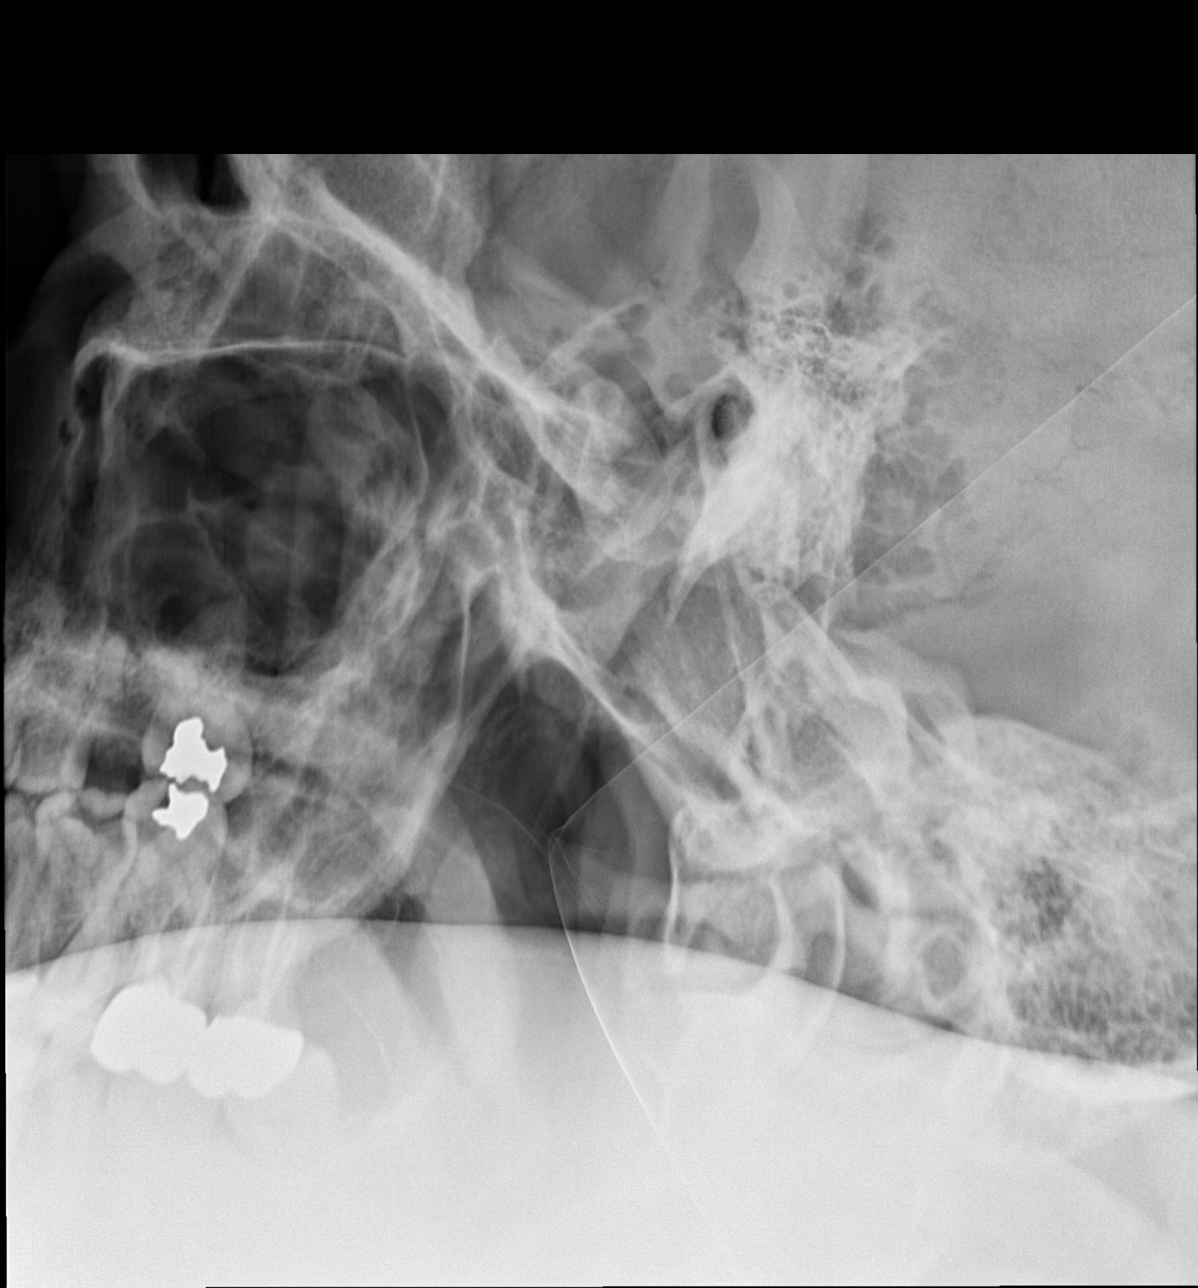

[w skull lat (2 of 2)]
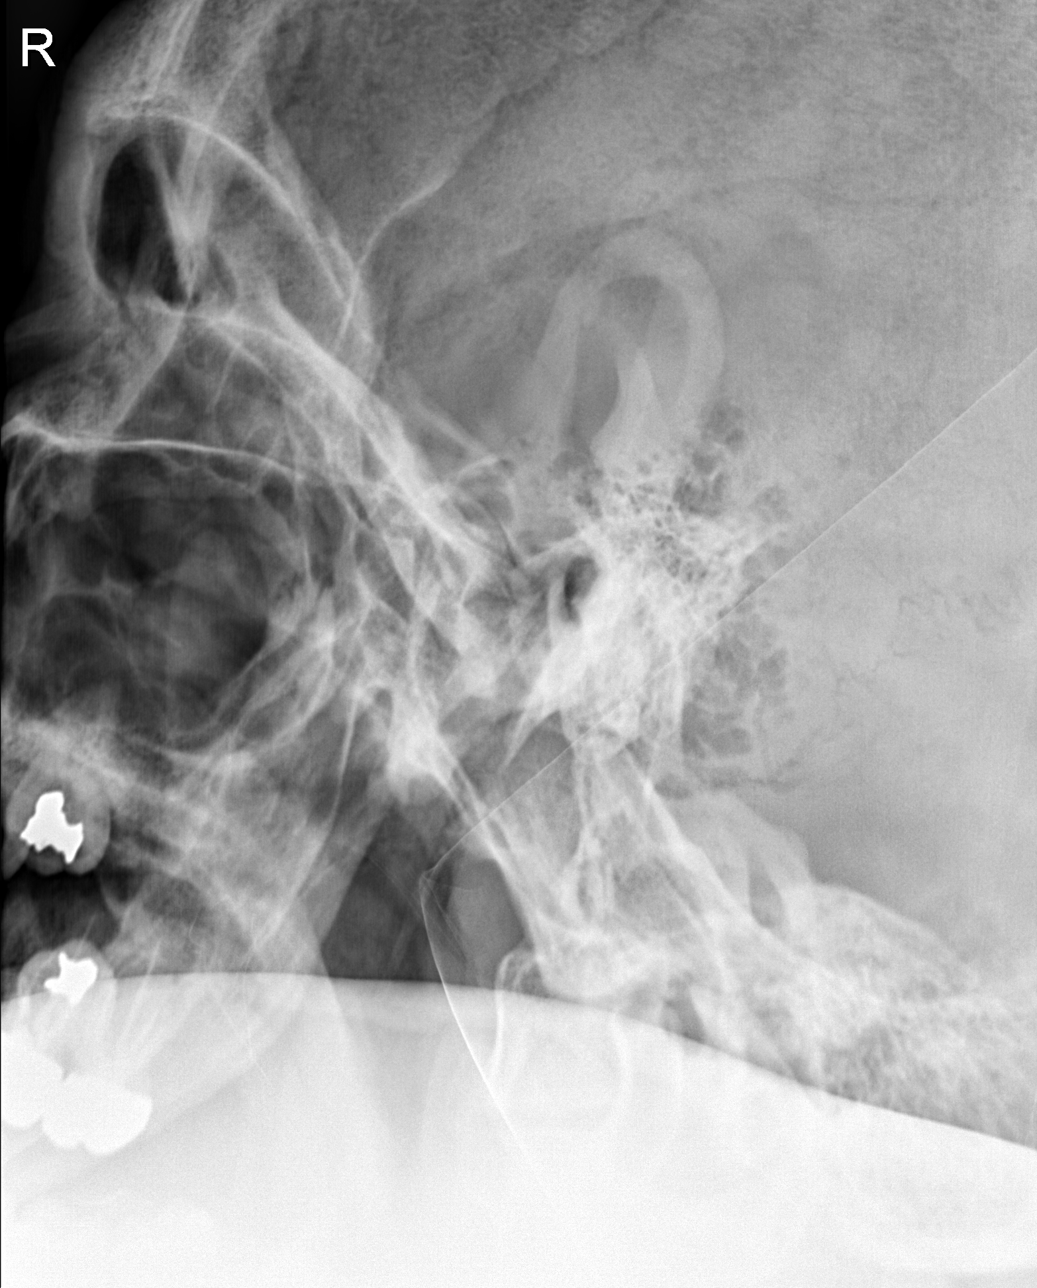

[4 of 4 positions shown; findings below may reference images not displayed]

FINDINGS: The right TMJ is located.  The joint space is preserved.
There is minimal translation upon opening of the jaw.
IMPRESSION: Normal appearance of the right TMJ.

## 2012-03-06 IMAGING — XA IR GI TUBE CONTRAST INJECTION
1 series · 5 of 5 positions shown · non-contrast
Comparison: Abdominal ultrasound dated 11/27/2010

CLINICAL DATA: History of prior percutaneous cholecystostomy tube
placement at [REDACTED].  The tube has become
partially retracted at the skin and assessment is performed to
evaluate tube position.

CHOLANGIOGRAM THROUGH PREEXISTING CATHETER

[Series 300: ir abscess drainage · 5 of 5 slices shown]
[im 1/5]
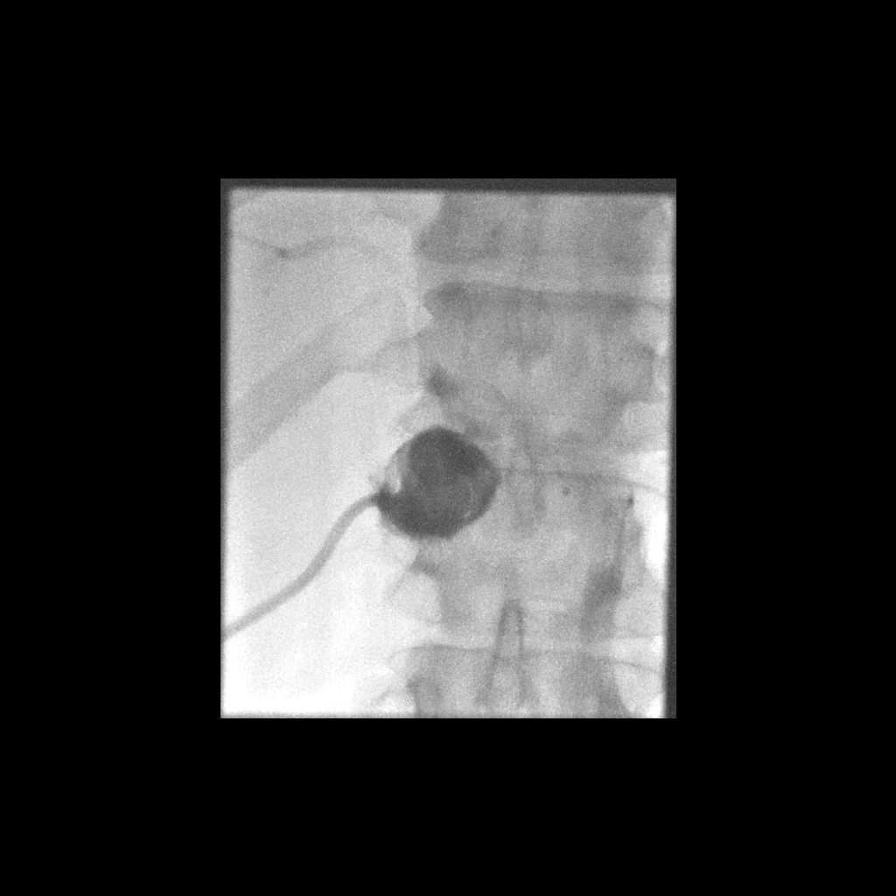
[im 2/5]
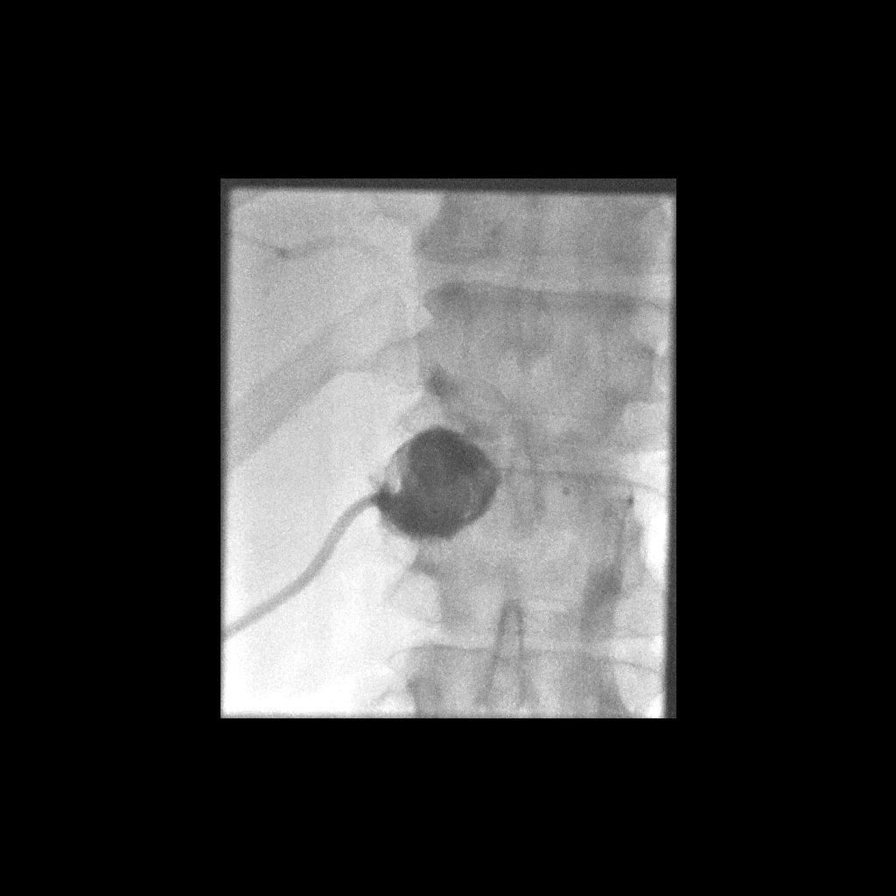
[im 3/5]
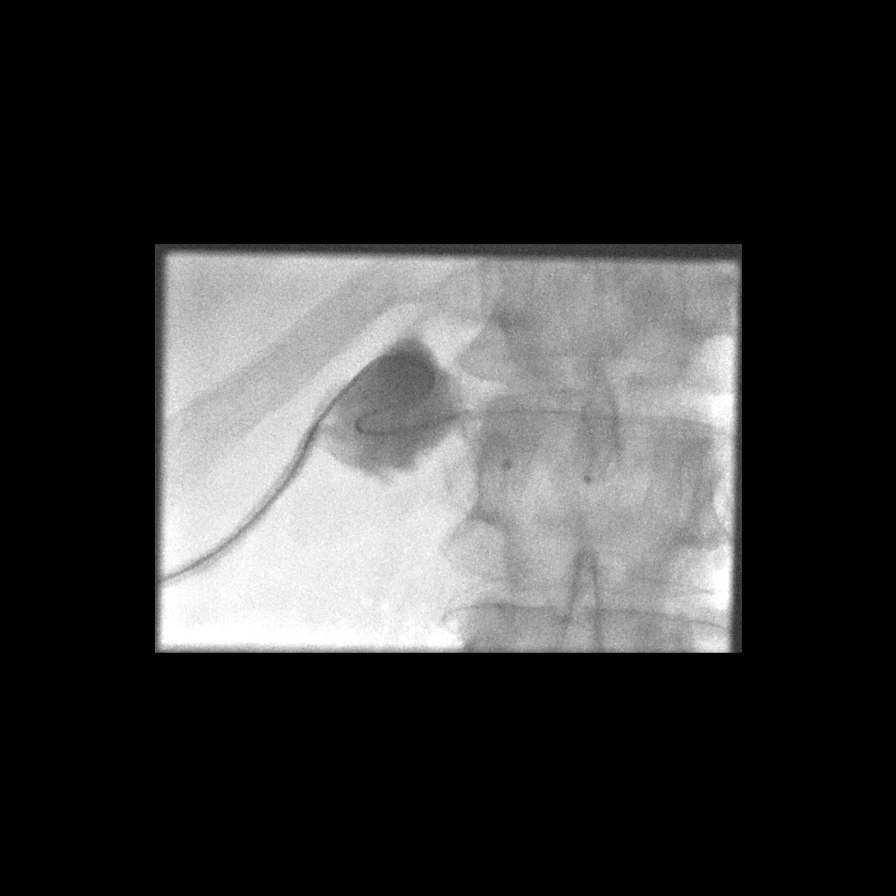
[im 4/5]
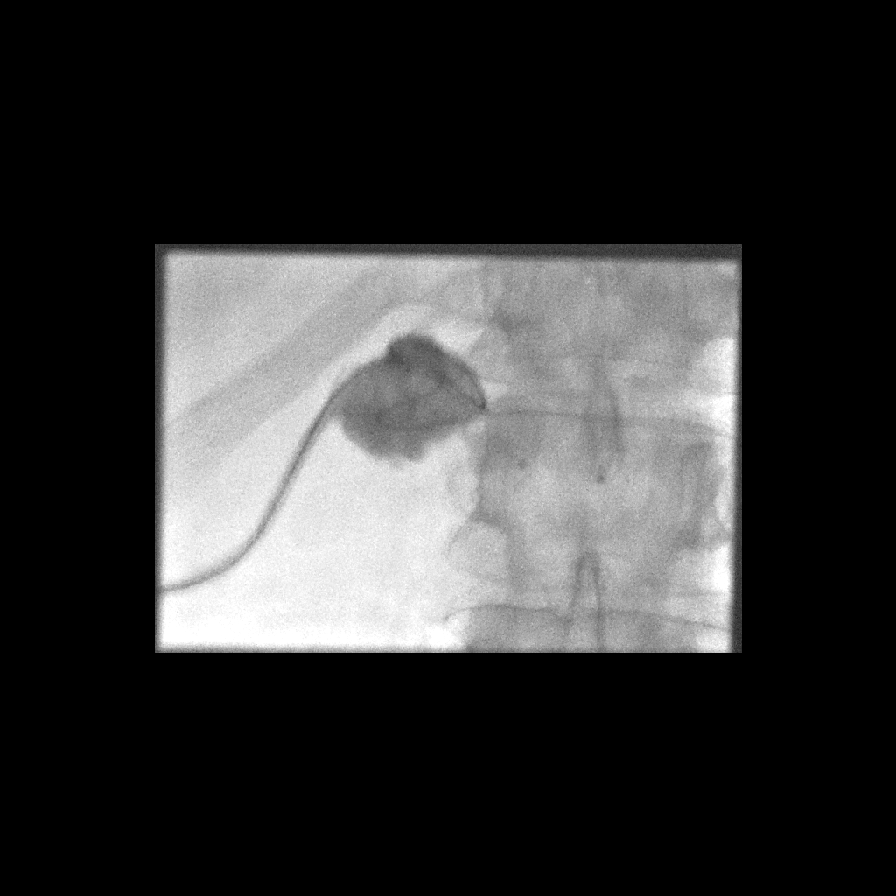
[im 5/5]
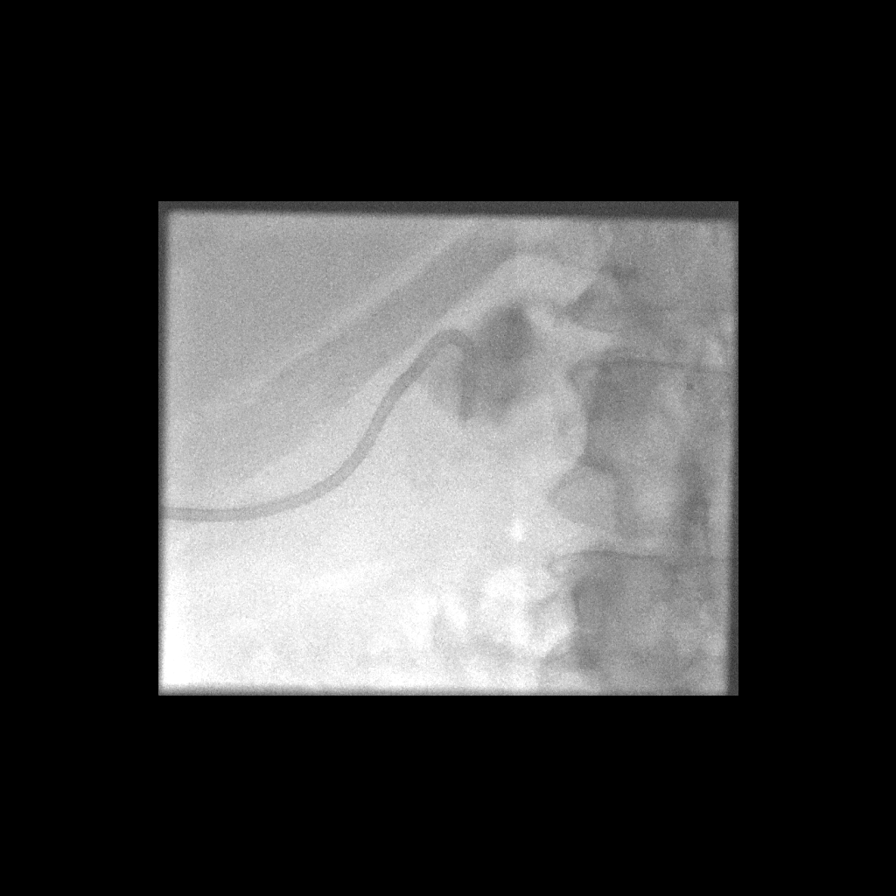

[5 of 5 positions shown; findings below may reference images not displayed]

Sedation: 6.0 mg IV Versed; 350 mcg IV Fentanyl.

Total Moderate Sedation Time: 58 minutes.

Contrast:  30 mL Smnipaque-JNN

Fluoroscopy Time: 8.6 minutes.

Procedure:  The procedure, risks, benefits, and alternatives were
explained to the patient.  Questions regarding the procedure were
encouraged and answered.  The patient understands and consents to
the procedure.

The preexisting cholecystostomy tube and surrounding skin was
prepped with betadine in a sterile fashion, and a sterile drape was
applied covering the operative field.  A sterile gown and sterile
gloves were used for the procedure. Local anesthesia was provided
with 1% Lidocaine.

Fluoroscopy was performed of the abdomen.  A preexisting
cholecystostomy catheter was injected with contrast material.  The
catheter was cut and a guidewire advanced into the gallbladder.
Attempts were made to remove the preexisting catheter fragment over
the wire. Multiple different guide wires were utilized.  Dilators
and catheters difference were also advanced through the preexisting
catheter fragment in attempted removal.

The catheter fragment was secured at the skin with a Prolene
retention suture.  Exiting catheter was tied off and secured with a
silk suture.

Complications: None
FINDINGS: The retention suture of the preexisting catheter was no
longer intact and the catheter had visibly retracted at least
several centimeters at the level of the skin entry site.  Contrast
injection and fluoroscopy confirms a small segment of the distal
catheter still remaining in the lumen of the gallbladder with
partial retraction of the catheter into the percutaneous tract.
Injected contrast material fills the gallbladder lumen and
immediately drains via the cystic duct into the common bile duct.
There is no evidence of cystic duct obstruction.

Given retraction of the catheter, catheter replacement was felt
indicated.  After cutting the catheter, it could not be removed
over a guidewire due to tethering at the level of the insertion.
It appeared that the focal point of tethering is right at the end
of the lumen of the gallbladder.  This does appear to be a
transhepatic course and it is conceivable that the catheter could
also be tethered focally in the liver.

Despite utilizing various guide wires, dilators and stiffness, the
cut catheter fragment could not be successfully removed.  Rather
than cause excessive trauma, the catheter fragment was secured at
the skin to prevent migration into the abdomen and tied off
external to the body.  If the patient requires future gallbladder
drainage, this would have to be performed via another access of the
gallbladder.  This was not currently performed due to stable
clinical status and cholangiogram findings of an open cystic duct.
The catheter fragment may only be removable at the time of elective
cholecystectomy.  Findings were discussed with the physician
currently taking care of the patient in the [REDACTED].
IMPRESSION: Initial injection of the cholecystostomy tube under fluoroscopy
demonstrates partial retraction of the catheter with the tip still
remaining in the lumen of the gallbladder.  Injected contrast
material fills the gallbladder and empties via an open cystic duct.
After cutting the catheter during attempted exchange for a new
cholecystostomy tube, it was evident that the catheter fragment was
tethered at the level of the gallbladder wall and/or transhepatic
tract.  The catheter fragment could not be removed as above and was
secured at the skin.  A new cholecystostomy tube was not placed via
a separate puncture of the gallbladder at this time given
cholangiogram findings.

## 2012-03-25 ENCOUNTER — Inpatient Hospital Stay (HOSPITAL_COMMUNITY): Payer: BC Managed Care – PPO

## 2012-03-25 ENCOUNTER — Emergency Department (HOSPITAL_COMMUNITY): Payer: BC Managed Care – PPO

## 2012-03-25 ENCOUNTER — Inpatient Hospital Stay (HOSPITAL_COMMUNITY)
Admission: EM | Admit: 2012-03-25 | Discharge: 2012-04-06 | DRG: 552 | Disposition: A | Discharge status: Home / Self Care | Payer: BC Managed Care – PPO | Attending: Internal Medicine | Admitting: Internal Medicine

## 2012-03-25 ENCOUNTER — Encounter (HOSPITAL_COMMUNITY): Payer: Self-pay | Admitting: *Deleted

## 2012-03-25 DIAGNOSIS — E213 Hyperparathyroidism, unspecified: Secondary | ICD-10-CM | POA: Diagnosis present

## 2012-03-25 DIAGNOSIS — D689 Coagulation defect, unspecified: Secondary | ICD-10-CM

## 2012-03-25 DIAGNOSIS — T45515A Adverse effect of anticoagulants, initial encounter: Secondary | ICD-10-CM | POA: Diagnosis present

## 2012-03-25 DIAGNOSIS — Z6841 Body Mass Index (BMI) 40.0 and over, adult: Secondary | ICD-10-CM

## 2012-03-25 DIAGNOSIS — R58 Hemorrhage, not elsewhere classified: Secondary | ICD-10-CM

## 2012-03-25 DIAGNOSIS — D62 Acute posthemorrhagic anemia: Secondary | ICD-10-CM

## 2012-03-25 DIAGNOSIS — I959 Hypotension, unspecified: Secondary | ICD-10-CM

## 2012-03-25 DIAGNOSIS — R579 Shock, unspecified: Secondary | ICD-10-CM

## 2012-03-25 DIAGNOSIS — S72143A Displaced intertrochanteric fracture of unspecified femur, initial encounter for closed fracture: Secondary | ICD-10-CM | POA: Diagnosis present

## 2012-03-25 DIAGNOSIS — X58XXXA Exposure to other specified factors, initial encounter: Secondary | ICD-10-CM | POA: Diagnosis present

## 2012-03-25 DIAGNOSIS — G4733 Obstructive sleep apnea (adult) (pediatric): Secondary | ICD-10-CM | POA: Diagnosis present

## 2012-03-25 DIAGNOSIS — N2889 Other specified disorders of kidney and ureter: Secondary | ICD-10-CM

## 2012-03-25 DIAGNOSIS — I824Y9 Acute embolism and thrombosis of unspecified deep veins of unspecified proximal lower extremity: Secondary | ICD-10-CM | POA: Diagnosis present

## 2012-03-25 DIAGNOSIS — K661 Hemoperitoneum: Principal | ICD-10-CM | POA: Diagnosis present

## 2012-03-25 DIAGNOSIS — S72009A Fracture of unspecified part of neck of unspecified femur, initial encounter for closed fracture: Secondary | ICD-10-CM

## 2012-03-25 DIAGNOSIS — D649 Anemia, unspecified: Secondary | ICD-10-CM

## 2012-03-25 DIAGNOSIS — Z86718 Personal history of other venous thrombosis and embolism: Secondary | ICD-10-CM

## 2012-03-25 DIAGNOSIS — E2749 Other adrenocortical insufficiency: Secondary | ICD-10-CM | POA: Diagnosis present

## 2012-03-25 DIAGNOSIS — N186 End stage renal disease: Secondary | ICD-10-CM | POA: Diagnosis present

## 2012-03-25 DIAGNOSIS — D72829 Elevated white blood cell count, unspecified: Secondary | ICD-10-CM | POA: Diagnosis present

## 2012-03-25 DIAGNOSIS — Z992 Dependence on renal dialysis: Secondary | ICD-10-CM

## 2012-03-25 DIAGNOSIS — K219 Gastro-esophageal reflux disease without esophagitis: Secondary | ICD-10-CM | POA: Diagnosis present

## 2012-03-25 DIAGNOSIS — IMO0001 Reserved for inherently not codable concepts without codable children: Secondary | ICD-10-CM | POA: Diagnosis present

## 2012-03-25 DIAGNOSIS — G8929 Other chronic pain: Secondary | ICD-10-CM | POA: Diagnosis present

## 2012-03-25 DIAGNOSIS — F329 Major depressive disorder, single episode, unspecified: Secondary | ICD-10-CM | POA: Diagnosis present

## 2012-03-25 DIAGNOSIS — R578 Other shock: Secondary | ICD-10-CM | POA: Diagnosis present

## 2012-03-25 DIAGNOSIS — Z7901 Long term (current) use of anticoagulants: Secondary | ICD-10-CM

## 2012-03-25 DIAGNOSIS — R6510 Systemic inflammatory response syndrome (SIRS) of non-infectious origin without acute organ dysfunction: Secondary | ICD-10-CM

## 2012-03-25 DIAGNOSIS — R791 Abnormal coagulation profile: Secondary | ICD-10-CM | POA: Diagnosis present

## 2012-03-25 DIAGNOSIS — F3289 Other specified depressive episodes: Secondary | ICD-10-CM | POA: Diagnosis present

## 2012-03-25 HISTORY — DX: End stage renal disease: N18.6

## 2012-03-25 HISTORY — DX: Dependence on renal dialysis: Z99.2

## 2012-03-25 LAB — CBC
HCT: 29.8 % — ABNORMAL LOW (ref 39.0–52.0)
HCT: 21.3 % — ABNORMAL LOW (ref 39.0–52.0)
Hemoglobin: 10.2 g/dL — ABNORMAL LOW (ref 13.0–17.0)
Hemoglobin: 8.6 g/dL — ABNORMAL LOW (ref 13.0–17.0)
Hemoglobin: 7.3 g/dL — ABNORMAL LOW (ref 13.0–17.0)
MCH: 35.3 pg — ABNORMAL HIGH (ref 26.0–34.0)
MCH: 35.2 pg — ABNORMAL HIGH (ref 26.0–34.0)
MCHC: 33.2 g/dL (ref 30.0–36.0)
MCHC: 33.7 g/dL (ref 30.0–36.0)
MCV: 103.2 fL — ABNORMAL HIGH (ref 78.0–100.0)
MCV: 97.8 fL (ref 78.0–100.0)
Platelets: 239 10*3/uL (ref 150–400)
Platelets: 231 10*3/uL (ref 150–400)
Platelets: 207 10*3/uL (ref 150–400)
RBC: 2.89 MIL/uL — ABNORMAL LOW (ref 4.22–5.81)
RBC: 2.81 MIL/uL — ABNORMAL LOW (ref 4.22–5.81)
RBC: 2.31 MIL/uL — ABNORMAL LOW (ref 4.22–5.81)
RBC: 2.16 MIL/uL — ABNORMAL LOW (ref 4.22–5.81)
RDW: 17 % — ABNORMAL HIGH (ref 11.5–15.5)
RDW: 17.4 % — ABNORMAL HIGH (ref 11.5–15.5)
RDW: 18.5 % — ABNORMAL HIGH (ref 11.5–15.5)
WBC: 24.5 10*3/uL — ABNORMAL HIGH (ref 4.0–10.5)
WBC: 27.1 10*3/uL — ABNORMAL HIGH (ref 4.0–10.5)
WBC: 30.5 10*3/uL — ABNORMAL HIGH (ref 4.0–10.5)

## 2012-03-25 LAB — POCT I-STAT, CHEM 8
BUN: 83 mg/dL — ABNORMAL HIGH (ref 6–23)
Chloride: 101 mEq/L (ref 96–112)
Creatinine, Ser: 6.3 mg/dL — ABNORMAL HIGH (ref 0.50–1.35)
Hemoglobin: 10.5 g/dL — ABNORMAL LOW (ref 13.0–17.0)
Potassium: 5.1 mEq/L (ref 3.5–5.1)
Sodium: 135 mEq/L (ref 135–145)

## 2012-03-25 LAB — POCT I-STAT 3, VENOUS BLOOD GAS (G3P V)
Acid-base deficit: 4 mmol/L — ABNORMAL HIGH (ref 0.0–2.0)
Bicarbonate: 22.4 mEq/L (ref 20.0–24.0)
Patient temperature: 37
TCO2: 24 mmol/L (ref 0–100)

## 2012-03-25 LAB — COMPREHENSIVE METABOLIC PANEL
AST: 144 U/L — ABNORMAL HIGH (ref 0–37)
CO2: 21 mEq/L (ref 19–32)
Calcium: 9.7 mg/dL (ref 8.4–10.5)
Creatinine, Ser: 6.85 mg/dL — ABNORMAL HIGH (ref 0.50–1.35)
GFR calc non Af Amer: 9 mL/min — ABNORMAL LOW (ref >90)

## 2012-03-25 LAB — PROTIME-INR
INR: 2.16 — ABNORMAL HIGH (ref 0.00–1.49)
Prothrombin Time: 23.2 seconds — ABNORMAL HIGH (ref 11.6–15.2)

## 2012-03-25 LAB — CK TOTAL AND CKMB (NOT AT ARMC): CK, MB: 4.2 ng/mL — ABNORMAL HIGH (ref 0.3–4.0)

## 2012-03-25 LAB — POCT I-STAT TROPONIN I: Troponin i, poc: 0.02 ng/mL (ref 0.00–0.08)

## 2012-03-25 LAB — CALCIUM, IONIZED: Calcium, Ion: 1.18 mmol/L (ref 1.12–1.32)

## 2012-03-25 LAB — LACTIC ACID, PLASMA: Lactic Acid, Venous: 1.9 mmol/L (ref 0.5–2.2)

## 2012-03-25 LAB — MRSA PCR SCREENING: MRSA by PCR: NEGATIVE

## 2012-03-25 LAB — MAGNESIUM: Magnesium: 2 mg/dL (ref 1.5–2.5)

## 2012-03-25 MED ORDER — SODIUM CHLORIDE 0.9 % IV SOLN
80.0000 mg | Freq: Once | INTRAVENOUS | Status: AC
  Administered 2012-03-25: 80 mg via INTRAVENOUS
  Filled 2012-03-25: qty 80

## 2012-03-25 MED ORDER — MORPHINE SULFATE 2 MG/ML IJ SOLN
1.0000 mg | INTRAMUSCULAR | Status: DC | PRN
  Administered 2012-03-25: 2 mg via INTRAVENOUS
  Administered 2012-03-25 (×2): 1 mg via INTRAVENOUS
  Administered 2012-03-26 – 2012-04-04 (×34): 2 mg via INTRAVENOUS
  Filled 2012-03-25 (×36): qty 1

## 2012-03-25 MED ORDER — NOREPINEPHRINE BITARTRATE 1 MG/ML IJ SOLN
2.0000 ug/min | INTRAVENOUS | Status: DC
  Administered 2012-03-26: 2 ug/min via INTRAVENOUS
  Filled 2012-03-25 (×2): qty 4

## 2012-03-25 MED ORDER — VITAMIN K1 10 MG/ML IJ SOLN
5.0000 mg | Freq: Once | INTRAMUSCULAR | Status: AC
  Administered 2012-03-25: 5 mg via INTRAMUSCULAR
  Filled 2012-03-25: qty 2.5

## 2012-03-25 MED ORDER — SODIUM CHLORIDE 0.9 % IV SOLN: INTRAVENOUS | Status: DC

## 2012-03-25 MED ORDER — SODIUM CHLORIDE 0.9 % IV SOLN
20.0000 ug | Freq: Once | INTRAVENOUS | Status: DC
  Filled 2012-03-25: qty 5

## 2012-03-25 MED ORDER — OXYCODONE HCL ER 40 MG PO T12A
60.0000 mg | EXTENDED_RELEASE_TABLET | Freq: Two times a day (BID) | ORAL | Status: DC
  Administered 2012-03-26 – 2012-04-04 (×17): 60 mg via ORAL
  Filled 2012-03-25: qty 3
  Filled 2012-03-25: qty 6
  Filled 2012-03-25: qty 2
  Filled 2012-03-25 (×3): qty 6
  Filled 2012-03-25: qty 4
  Filled 2012-03-25: qty 6
  Filled 2012-03-25: qty 2
  Filled 2012-03-25: qty 6
  Filled 2012-03-25: qty 3
  Filled 2012-03-25: qty 6
  Filled 2012-03-25: qty 3
  Filled 2012-03-25: qty 2
  Filled 2012-03-25 (×3): qty 6

## 2012-03-25 MED ORDER — VITAMIN K1 10 MG/ML IJ SOLN: 5.0000 mg | Freq: Once | INTRAVENOUS | Status: DC

## 2012-03-25 MED ORDER — MORPHINE SULFATE 2 MG/ML IJ SOLN
INTRAMUSCULAR | Status: AC
  Administered 2012-03-25: 2 mg via INTRAVENOUS
  Filled 2012-03-25: qty 1

## 2012-03-25 MED ORDER — MORPHINE SULFATE 4 MG/ML IJ SOLN
4.0000 mg | Freq: Once | INTRAMUSCULAR | Status: AC
  Administered 2012-03-25: 4 mg via INTRAVENOUS

## 2012-03-25 MED ORDER — MORPHINE SULFATE 4 MG/ML IJ SOLN
6.0000 mg | Freq: Once | INTRAMUSCULAR | Status: AC
  Administered 2012-03-25: 6 mg via INTRAVENOUS
  Filled 2012-03-25: qty 2

## 2012-03-25 MED ORDER — ESCITALOPRAM OXALATE 20 MG PO TABS
20.0000 mg | ORAL_TABLET | Freq: Every day | ORAL | Status: DC
  Administered 2012-03-26 – 2012-04-06 (×12): 20 mg via ORAL
  Filled 2012-03-25 (×13): qty 1

## 2012-03-25 MED ORDER — SODIUM CHLORIDE 0.9 % IV BOLUS (SEPSIS)
500.0000 mL | Freq: Once | INTRAVENOUS | Status: AC
  Administered 2012-03-30: 500 mL via INTRAVENOUS

## 2012-03-25 MED ORDER — MORPHINE SULFATE 4 MG/ML IJ SOLN: 4.0000 mg | INTRAMUSCULAR | Status: DC | PRN

## 2012-03-25 MED ORDER — MORPHINE SULFATE 2 MG/ML IJ SOLN
INTRAMUSCULAR | Status: AC
  Filled 2012-03-25: qty 2

## 2012-03-25 MED ORDER — ONDANSETRON HCL 4 MG/2ML IJ SOLN
4.0000 mg | Freq: Once | INTRAMUSCULAR | Status: AC
  Administered 2012-03-25: 4 mg via INTRAVENOUS
  Filled 2012-03-25: qty 2

## 2012-03-25 MED ORDER — SODIUM CHLORIDE 0.9 % IV SOLN: 250.0000 mL | INTRAVENOUS | Status: DC | PRN

## 2012-03-25 MED ORDER — ONDANSETRON HCL 4 MG/2ML IJ SOLN
4.0000 mg | Freq: Four times a day (QID) | INTRAMUSCULAR | Status: DC | PRN
  Administered 2012-03-25 – 2012-03-26 (×3): 4 mg via INTRAVENOUS
  Filled 2012-03-25 (×2): qty 2

## 2012-03-25 MED ORDER — IOHEXOL 350 MG/ML SOLN
100.0000 mL | Freq: Once | INTRAVENOUS | Status: AC | PRN
  Administered 2012-03-25: 100 mL via INTRAVENOUS

## 2012-03-25 MED ORDER — SEVELAMER CARBONATE 800 MG PO TABS: 800.0000 mg | ORAL_TABLET | ORAL | Status: DC

## 2012-03-25 MED ORDER — RENA-VITE PO TABS
1.0000 | ORAL_TABLET | Freq: Every day | ORAL | Status: DC
  Administered 2012-03-26: 1 via ORAL
  Filled 2012-03-25 (×3): qty 1

## 2012-03-25 MED ORDER — ONDANSETRON HCL 4 MG/2ML IJ SOLN
INTRAMUSCULAR | Status: AC
  Administered 2012-03-25: 4 mg via INTRAVENOUS
  Filled 2012-03-25: qty 2

## 2012-03-25 MED ORDER — SODIUM CHLORIDE 0.9 % IV SOLN
8.0000 mg/h | INTRAVENOUS | Status: DC
  Administered 2012-03-25: 8 mg/h via INTRAVENOUS
  Filled 2012-03-25 (×3): qty 80

## 2012-03-25 MED ORDER — ONDANSETRON HCL 4 MG/2ML IJ SOLN
4.0000 mg | Freq: Once | INTRAMUSCULAR | Status: AC
  Administered 2012-03-25: 4 mg via INTRAVENOUS

## 2012-03-25 MED ORDER — SODIUM CHLORIDE 0.9 % IV SOLN: 80.0000 mg | Freq: Once | INTRAVENOUS | Status: DC

## 2012-03-25 MED ORDER — PANTOPRAZOLE SODIUM 40 MG PO TBEC: 40.0000 mg | DELAYED_RELEASE_TABLET | Freq: Every day | ORAL | Status: DC

## 2012-03-25 MED ORDER — ONDANSETRON HCL 4 MG/2ML IJ SOLN
INTRAMUSCULAR | Status: AC
  Filled 2012-03-25: qty 2

## 2012-03-25 MED ORDER — ALPRAZOLAM 0.5 MG PO TABS
1.0000 mg | ORAL_TABLET | Freq: Four times a day (QID) | ORAL | Status: DC | PRN
  Administered 2012-03-26 – 2012-04-06 (×10): 1 mg via ORAL
  Filled 2012-03-25 (×7): qty 2
  Filled 2012-03-25: qty 1
  Filled 2012-03-25 (×2): qty 2
  Filled 2012-03-25: qty 1
  Filled 2012-03-25: qty 2

## 2012-03-25 MED ORDER — NOREPINEPHRINE BITARTRATE 1 MG/ML IJ SOLN
5.0000 ug/min | INTRAVENOUS | Status: DC
  Filled 2012-03-25: qty 4

## 2012-03-25 MED ORDER — PANTOPRAZOLE SODIUM 40 MG IV SOLR
40.0000 mg | INTRAVENOUS | Status: DC
  Administered 2012-03-25: 40 mg via INTRAVENOUS
  Filled 2012-03-25 (×4): qty 40

## 2012-03-25 MED ORDER — SODIUM CHLORIDE 0.9 % IV BOLUS (SEPSIS)
1000.0000 mL | Freq: Once | INTRAVENOUS | Status: AC
  Administered 2012-03-25: 1000 mL via INTRAVENOUS

## 2012-03-25 MED ORDER — ERTAPENEM SODIUM 1 G IJ SOLR
1.0000 g | Freq: Once | INTRAMUSCULAR | Status: AC
  Administered 2012-03-25: 1 g via INTRAVENOUS
  Filled 2012-03-25: qty 1

## 2012-03-25 MED ORDER — OXYCODONE HCL 15 MG PO TB12: 60.0000 mg | ORAL_TABLET | Freq: Two times a day (BID) | ORAL | Status: DC

## 2012-03-25 MED ORDER — SODIUM CHLORIDE 0.9 % IV BOLUS (SEPSIS)
500.0000 mL | Freq: Once | INTRAVENOUS | Status: AC
  Administered 2012-03-25: 500 mL via INTRAVENOUS

## 2012-03-25 NOTE — ED Notes (Signed)
RT unable to place arterial line, EDP Manly notified.

## 2012-03-25 NOTE — ED Notes (Signed)
All blood products verified by Danford Bad, RN

## 2012-03-25 NOTE — Consult Note (Addendum)
[name] 03/25/2012 [name] Requesting Physician:  Dr. Tyson Alias  Reason for Consult:  Need for dialysis in ESRD pt from [name] with shock and perirenal bleed HPI: The patient is a 46 y.o. year-old with ESRD due to DM/HTN dialyzed at [facility] dialysis unit in [name], tts schedule via L forearm AVF since 2013. Pt was admitted early this morning after presenting with N/V and low BP. BP was 64/39 on presentation; INR was 2.1, Hb was 10.5 at 1 am and has dropped to 7.7 at 1:20 PM today. CT of abdomen shows a large intracapsular R perirenal bleed. Coumadin recently on hold for elevated INR of 6. Hx of DM, HTN, IVC filter, DVT and gastric sleeve 2011 (800 > 350 lbs). Gastric sleeve was c/b EC fistula which healed over time. Also renal stones, hip fx with screw, chole tube for acute cholecystitis in 2012.   Missed HD yesterday.   ROS  mild abd pain R side  no n/v now  no cp, sob  no cough, no leg swelling  missed hd yest  no rash or itching  no focal weakness  Past Medical History:  Past Medical History  Diagnosis Date  . Hypertension   . GERD (gastroesophageal reflux disease)   . DVT (deep venous thrombosis)   . Renal disorder   . Complication of anesthesia     " I am hard  to wake up "  . Diabetes mellitus   . ESRD (end stage renal disease)     Past Surgical History:  Past Surgical History  Procedure Date  . Gastric sleeve   . Kidney stones   . Dialysis port   . Compression hip screw 08/26/2011    Procedure: COMPRESSION HIP;  Surgeon: Velna Ochs, MD;  Location: [facility] OR;  Service: Orthopedics;  Laterality: Left;  DYNAMIC HIP SCREW COMPRESSION  . Ivc filter   . Cholecystostomy tube     Family History: History reviewed. No pertinent family history. Social History:  reports that he has been smoking Cigarettes.  He has never used smokeless tobacco. He reports that he does not drink alcohol or use illicit drugs.  Allergies:  Allergies  Allergen Reactions   . Piperacillin Sod-Tazobactam So Hives    Home medications: Prior to Admission medications   Medication Sig Start Date End Date Taking? Authorizing Provider  ALPRAZolam Prudy Feeler) 1 MG tablet Take 1 mg by mouth every 6 (six) hours as needed. anxiety   Yes Historical Provider, MD  carisoprodol (SOMA) 350 MG tablet Take 350 mg by mouth 3 (three) times daily.   Yes Historical Provider, MD  epoetin alfa (EPOGEN,PROCRIT) 95621 UNIT/ML injection Inject 6,500 Units into the skin 3 (three) times a week. Receives at [facility]. Last visit 08/23/11   Yes Historical Provider, MD  escitalopram (LEXAPRO) 20 MG tablet Take 20 mg by mouth daily.   Yes Historical Provider, MD  multivitamin (RENA-VIT) TABS tablet Take 1 tablet by mouth daily.   Yes Historical Provider, MD  omeprazole (PRILOSEC) 20 MG capsule Take 20 mg by mouth 2 (two) times daily.   Yes Historical Provider, MD  oxyCODONE 60 MG TB12 Take 1 tablet (60 mg total) by mouth every 12 (twelve) hours. 09/09/11  Yes Mcarthur Rossetti Angiulli, PA  sevelamer (RENVELA) 800 MG tablet Take 800-1,600 mg by mouth See admin instructions. Two capsules with each meal, and one capsule with each snack   Yes Historical Provider, MD  warfarin (COUMADIN) 1 MG tablet Take 1 mg by mouth daily.  Takes with 3mg  and 10mg  = 14mg /dose   Yes Historical Provider, MD  warfarin (COUMADIN) 10 MG tablet Take 10 mg by mouth daily. Takes with 3mg  and 1mg  = 14mg /dose daily 09/09/11 09/08/12 Yes Daniel J Angiulli, PA  warfarin (COUMADIN) 3 MG tablet Take 3 mg by mouth daily. Takes with 1mg  and 10mg  for 14mg /dose   Yes Historical Provider, MD    Inpatient medications:    . desmopressin (DDAVP) IV  20 mcg Intravenous Once  . ertapenem  1 g Intravenous Once  . escitalopram  20 mg Oral Daily  . morphine      . morphine      .  morphine injection  4 mg Intravenous Once  .  morphine injection  6 mg Intravenous Once  . multivitamin  1 tablet Oral Daily  . ondansetron      . ondansetron  (ZOFRAN) IV  4 mg Intravenous Once  . ondansetron (ZOFRAN) IV  4 mg Intravenous Once  . OxyCODONE  60 mg Oral Q12H  . pantoprazole (PROTONIX) IV  80 mg Intravenous Once  . pantoprazole (PROTONIX) IV  40 mg Intravenous Q24H  . phytonadione  5 mg Intramuscular Once  . sodium chloride  1,000 mL Intravenous Once  . sodium chloride  1,000 mL Intravenous Once  . sodium chloride  500 mL Intravenous Once  . sodium chloride  500 mL Intravenous Once  . DISCONTD: oxyCODONE  60 mg Oral Q12H  . DISCONTD: pantoprazole (PROTONIX) IV  80 mg Intravenous Once  . DISCONTD: pantoprazole  40 mg Oral Daily  . DISCONTD: phytonadione (VITAMIN K) IV  5 mg Intravenous Once  . DISCONTD: sevelamer  800-1,600 mg Oral See admin instructions     Labs: Basic Metabolic Panel:  Lab 03/25/12 9604 03/25/12 0054  NA 135 134*  K 5.1 5.1  CL 101 92*  CO2 -- 21  GLUCOSE 157* 163*  BUN 83* 81*  CREATININE 6.30* 6.85*  ALB -- --  CALCIUM -- 9.7  PHOS -- --   Liver Function Tests:  Lab 03/25/12 0054  AST 144*  ALT 135*  ALKPHOS 133*  BILITOT 0.4  PROT 7.4  ALBUMIN 2.8*   No results found for this basename: LIPASE:3,AMYLASE:3 in the last 168 hours No results found for this basename: AMMONIA:3 in the last 168 hours CBC:  Lab 03/25/12 1320 03/25/12 0823 03/25/12 0552 03/25/12 0251  WBC 27.1* 28.6* 24.5* 28.2*  NEUTROABS -- -- -- --  HGB 7.7* 8.6* 8.2* 9.9*  HCT 22.6* 25.5* 24.7* 29.0*  MCV 97.8 98.1 104.2* 103.2*  PLT 207 246 231 239   PT/INR: @labrcntip (inr:5) Cardiac Enzymes:  Lab 03/25/12 0054  CKTOTAL 35  CKMB 4.2*  CKMBINDEX --  TROPONINI --   CBG: No results found for this basename: GLUCAP:5 in the last 168 hours  Iron Studies: No results found for this basename: IRON:30,TIBC:30,TRANSFERRIN:30,FERRITIN:30 in the last 168 hours  Physical Exam:  Blood pressure 81/53, pulse 103, temperature 96.5 F (35.8 C), temperature source Oral, resp. rate 23, SpO2 100.00%.  Gen: alert, BP 90's, no  distress Skin: no rash, cyanosis HEENT:  EOMI, sclera anicteric, throat clear Neck: no JVD, no bruits or LAN Chest: clear bilat, no rales Heart: regular, no rub or gallop, no murmur Abdomen: soft, obese, tender RUQ and flank, no mass, no ascites  Ext: no LE edema or UE edema. +T/B over L FA avf Neuro: alert, Ox3, no focal deficit  CXR- no acute disease CT abd- large R perirenal bleed  Outpatient HD: [facility] dialysis in [facility]. 4.5 hours, says dry weight is 328lbs. Gets some heparin, not sure how much.    Impression/Plan 1. Hypovolemic shock due to perirenal bleed- improved with NS, FFP 2. Coagulopathy- recently INR 6, today 2.1 3. R perirenal bleed- per primary. REc'd DDAVP x 1, ffp and vit K. 4. Anemia due to ABL/CKD- Hb down from 10 >7.7 since midnight, BP still low. Will order 2u PRBC's to be given with HD tonight, but if it needs to be given sooner (HD will be later around 8-9 pm) feel free to give it. I have d/w CCM MD. 5. CKD, hd tts in [name] via L avf. No vol excess clinically.  6. Morbid obesity, s/p gastric sleeve 2011 7. BP- no BP meds at home. BP usually "low", from 90;'s to 110 at HD. 8. DM2 9. Hx DVT, was on coumadin PTA  P- HD today, short session, no sig UF given low BP's. No heparin. Cont tts schedule. Stop NS at 125. Will follow.    Vinson Moselle  MD [employer] [phone] pgr    [phone] cell 03/25/2012, 2:24 PM

## 2012-03-25 NOTE — ED Provider Notes (Signed)
History     CSN: [id number]  Arrival date & time 03/25/12  0028   First MD Initiated Contact with Patient 03/25/12 0110      Chief Complaint  Patient presents with  . Abdominal Pain    (Consider location/radiation/quality/duration/timing/severity/associated sxs/prior treatment) HPI  Please note this is a late entry. This patient was seen and evaluated by me shortly after his presentation to the emergency department.  The patient is a morbidly obese 46 year old man with multiple chronic medical problems including end-stage renal disease treated with hemodialysis on Tuesday/ Thursday/ Saturdays, history of DVT, anticoagulation with Coumadin, hypertension. The patient says he is nonambulatory as a result of left hip fracture in March 2013 which was treated surgically.  Patient presents to the emergency department with a chief complaint of right lower quadrant abdominal pain with persistent nausea, vomiting. Patient estimates that he has vomited 25 times over the last 24 hours prior to arrival in the emergency department. Last few episodes of emesis contains dark red blood. The patient's wife says "there was nothing but blood". The patient describes streaks of blood intermixed with process. Patient denies diarrhea. However, he has had 2 episodes of hematochezia, just prior to arrival. Denies fever. No history of similar symptoms. No GU symptoms. The patient is oliguric.  The patient has an extensive history of abdominal surgeries. He underwent a gastric sleeve procedure in 2011 which was complicated by infection, acute renal failure. Patient says he was hospitalized for 18 months at that time. Patient says he has not had an appendectomy, to the best of his knowledge.  The patient rates his pain as 10 over 10, sharp, constant, nonradiating. His wife says that he appears pale. The patient denies experiencing chest pain, shortness of breath or symptoms of near syncope.  After the patient had  been in the emergency department for about 30-60 minutes, he began to complain of right-sided mid to low back pain which she also rated 10 over 10 and describes as sharp, aching and constant. Patient reports history of chronic back pain. Denies any new neurologic deficits.  Patient was last dialized 3d ago. Says he missed yesterday's dialysis because he was feeling so poorly.   Patient notes he was told to hold Coumadin over the weekend after routine INR check showed INR of "6 point something" per the clinic nurse who called the patient. Wife notes that she checked patient's BP when his sx began - around 10am yesterday and found his BP to read 57/39. Wife thought this was low. Pt says BP usually runs between 90 to 100 mmHg.   Past Medical History  Diagnosis Date  . Hypertension   . GERD (gastroesophageal reflux disease)   . DVT (deep venous thrombosis)   . Renal disorder   . Complication of anesthesia     " I am hard  to wake up "  . Diabetes mellitus   . ESRD (end stage renal disease)     Past Surgical History  Procedure Date  . Gastric sleeve   . Kidney stones   . Dialysis port   . Compression hip screw 08/26/2011    Procedure: COMPRESSION HIP;  Surgeon: Velna Ochs, MD;  Location: [facility] OR;  Service: Orthopedics;  Laterality: Left;  DYNAMIC HIP SCREW COMPRESSION  . Ivc filter   . Cholecystostomy tube     History reviewed. No pertinent family history.  History  Substance Use Topics  . Smoking status: Current Every Day Smoker    Types:  Cigarettes  . Smokeless tobacco: Never Used  . Alcohol Use: No      Review of Systems Gen: General malaise, generally weak feeling. No fever Eyes: no discharge or drainage, no occular pain or visual changes Nose: no epistaxis or rhinorrhea Mouth: no dental pain, no sore throat Neck: no neck pain Lungs: no SOB, cough, wheezing CV: no chest pain, palpitations, dependent edema or orthopnea, dear 80 weak radial pulses palpated bilaterally,  fingers are cool with Refill is about 2 seconds. Abd: As per history of present illness, otherwise negative GU: Oliguric, otherwise negative, no hematuria appreciated by patient. MSK: As per history of present illness, otherwise negative Neuro: no headache, no focal neurologic deficits, patient is nonambulatory Skin: No rash, ecchymoses or petechiae appreciated by the patient,. Psyche: negative. Allergies  Piperacillin sod-tazobactam so  Home Medications   Current Outpatient Rx  Name Route Sig Dispense Refill  . ALPRAZOLAM 1 MG PO TABS Oral Take 1 mg by mouth every 6 (six) hours as needed. anxiety    . CARISOPRODOL 350 MG PO TABS Oral Take 350 mg by mouth 3 (three) times daily.    . EPOETIN ALFA 40981 UNIT/ML IJ SOLN Subcutaneous Inject 6,500 Units into the skin 3 (three) times a week. Receives at [facility]. Last visit 08/23/11    . ESCITALOPRAM OXALATE 20 MG PO TABS Oral Take 20 mg by mouth daily.    Marland Kitchen RENA-VITE PO TABS Oral Take 1 tablet by mouth daily.    Marland Kitchen OMEPRAZOLE 20 MG PO CPDR Oral Take 20 mg by mouth 2 (two) times daily.    . OXYCODONE HCL ER 60 MG PO TB12 Oral Take 1 tablet (60 mg total) by mouth every 12 (twelve) hours. 28 tablet 0  . SEVELAMER CARBONATE 800 MG PO TABS Oral Take 800-1,600 mg by mouth See admin instructions. Two capsules with each meal, and one capsule with each snack    . WARFARIN SODIUM 1 MG PO TABS Oral Take 1 mg by mouth daily. Takes with 3mg  and 10mg  = 14mg /dose    . WARFARIN SODIUM 10 MG PO TABS Oral Take 10 mg by mouth daily. Takes with 3mg  and 1mg  = 14mg /dose daily    . WARFARIN SODIUM 3 MG PO TABS Oral Take 3 mg by mouth daily. Takes with 1mg  and 10mg  for 14mg /dose      BP 85/33  Pulse 113  Temp 98 F (36.7 C) (Oral)  Resp 20  SpO2 89%  Physical Exam Gen: Acutely and chronically ill appearing. Head: NCAT Eyes: PERL, EOMI, conjunctiva appear pale Nose: no epistaixis or rhinorrhea Mouth/throat: mucosa is dehydrated appearing and pink, poor  oral hygiene is noted Neck: supple, no stridor neck veins are flat Lungs: Respiratory rate 24-20 times per minute, CTA B, no wheezing, rhonchi or rales CV: rapid and regular rhythm, no murmur appreciated, initially unable to palpate radial pulses, no jvd.  Abd: Morbidly obese, exquisite tenderness to palpation of the right lower quadrant soft,  nondistended, unable to evaluate for organomegaly because of body habitus. Rectal: Very very small amount of reddish-appearing stool in rectum which is guaiac-positive. Back: no ttp, no ttp Skin: No ecchymosis, petechia, cool, pale.  Ext: left forearm graft with good thrill and no signs of infection,  Neuro: CN ii-xii grossly intact, no focal deficits Psyche; anxious affect  ED Course  Procedures (including critical care time)  Results for orders placed during the hospital encounter of 03/25/12 (from the past 24 hour(s))  COMPREHENSIVE METABOLIC PANEL  Status: Abnormal   Collection Time   03/25/12 12:54 AM      Component Value Range   Sodium 134 (*) 135 - 145 mEq/L   Potassium 5.1  3.5 - 5.1 mEq/L   Chloride 92 (*) 96 - 112 mEq/L   CO2 21  19 - 32 mEq/L   Glucose, Bld 163 (*) 70 - 99 mg/dL   BUN 81 (*) 6 - 23 mg/dL   Creatinine, Ser 1.30 (*) 0.50 - 1.35 mg/dL   Calcium 9.7  8.4 - 86.5 mg/dL   Total Protein 7.4  6.0 - 8.3 g/dL   Albumin 2.8 (*) 3.5 - 5.2 g/dL   AST 784 (*) 0 - 37 U/L   ALT 135 (*) 0 - 53 U/L   Alkaline Phosphatase 133 (*) 39 - 117 U/L   Total Bilirubin 0.4  0.3 - 1.2 mg/dL   GFR calc non Af Amer 9 (*) >90 mL/min   GFR calc Af Amer 10 (*) >90 mL/min  APTT     Status: Abnormal   Collection Time   03/25/12 12:54 AM      Component Value Range   aPTT 40 (*) 24 - 37 seconds  PROTIME-INR     Status: Abnormal   Collection Time   03/25/12 12:54 AM      Component Value Range   Prothrombin Time 23.2 (*) 11.6 - 15.2 seconds   INR 2.16 (*) 0.00 - 1.49  LACTIC ACID, PLASMA     Status: Abnormal   Collection Time   03/25/12  12:54 AM      Component Value Range   Lactic Acid, Venous 3.5 (*) 0.5 - 2.2 mmol/L  CK TOTAL AND CKMB     Status: Abnormal   Collection Time   03/25/12 12:54 AM      Component Value Range   Total CK 35  7 - 232 U/L   CK, MB 4.2 (*) 0.3 - 4.0 ng/mL   Relative Index RELATIVE INDEX IS INVALID  0.0 - 2.5  POCT I-STAT TROPONIN I     Status: Normal   Collection Time   03/25/12 12:59 AM      Component Value Range   Troponin i, poc 0.02  0.00 - 0.08 ng/mL   Comment 3           POCT I-STAT, CHEM 8     Status: Abnormal   Collection Time   03/25/12  1:11 AM      Component Value Range   Sodium 135  135 - 145 mEq/L   Potassium 5.1  3.5 - 5.1 mEq/L   Chloride 101  96 - 112 mEq/L   BUN 83 (*) 6 - 23 mg/dL   Creatinine, Ser 6.96 (*) 0.50 - 1.35 mg/dL   Glucose, Bld 295 (*) 70 - 99 mg/dL   Calcium, Ion 2.84  1.32 - 1.23 mmol/L   TCO2 21  0 - 100 mmol/L   Hemoglobin 10.5 (*) 13.0 - 17.0 g/dL   HCT 44.0 (*) 10.2 - 72.5 %  TYPE AND SCREEN     Status: Normal (Preliminary result)   Collection Time   03/25/12  1:17 AM      Component Value Range   ABO/RH(D) O NEG     Antibody Screen NEG     Sample Expiration 03/28/2012     Unit Number [id number]     Blood Component Type RED CELLS,LR     Unit division 00     Status of Unit  ISSUED     Transfusion Status OK TO TRANSFUSE     Crossmatch Result Compatible     Unit Number [id number]     Blood Component Type RED CELLS,LR     Unit division 00     Status of Unit ALLOCATED     Transfusion Status OK TO TRANSFUSE     Crossmatch Result Compatible     Unit Number [id number]     Blood Component Type RED CELLS,LR     Unit division 00     Status of Unit ALLOCATED     Transfusion Status OK TO TRANSFUSE     Crossmatch Result Compatible     Unit Number [id number]     Blood Component Type RBC LR PHER1     Unit division 00     Status of Unit ISSUED     Transfusion Status OK TO TRANSFUSE     Crossmatch Result Compatible    MAGNESIUM      Status: Normal   Collection Time   03/25/12  1:33 AM      Component Value Range   Magnesium 2.0  1.5 - 2.5 mg/dL  CBC     Status: Abnormal   Collection Time   03/25/12  1:33 AM      Component Value Range   WBC 35.3 (*) 4.0 - 10.5 K/uL   RBC 2.89 (*) 4.22 - 5.81 MIL/uL   Hemoglobin 10.2 (*) 13.0 - 17.0 g/dL   HCT 74.2 (*) 59.5 - 63.8 %   MCV 103.1 (*) 78.0 - 100.0 fL   MCH 35.3 (*) 26.0 - 34.0 pg   MCHC 34.2  30.0 - 36.0 g/dL   RDW 75.6 (*) 43.3 - 29.5 %   Platelets 310  150 - 400 K/uL  PREPARE RBC (CROSSMATCH)     Status: Normal   Collection Time   03/25/12  1:34 AM      Component Value Range   Order Confirmation ORDER PROCESSED BY BLOOD BANK    PREPARE FRESH FROZEN PLASMA     Status: Normal (Preliminary result)   Collection Time   03/25/12  1:34 AM      Component Value Range   Unit Number [id number]     Blood Component Type THAWED PLASMA     Unit division 00     Status of Unit ISS'D ANOTH PT     Transfusion Status OK TO TRANSFUSE     Unit Number [id number]     Blood Component Type THAWED PLASMA     Unit division 00     Status of Unit ISS'D ANOTH PT     Transfusion Status OK TO TRANSFUSE     Unit Number [id number]     Blood Component Type THAWED PLASMA     Unit division 00     Status of Unit ISSUED     Transfusion Status OK TO TRANSFUSE     Unit Number [id number]     Blood Component Type THAWED PLASMA     Unit division 00     Status of Unit ISSUED     Transfusion Status OK TO TRANSFUSE     Unit Number [id number]     Blood Component Type THAWED PLASMA     Unit division 00     Status of Unit ALLOCATED     Transfusion Status OK TO TRANSFUSE     Unit Number [id number]     Blood Component Type THAWED PLASMA     Unit division 00  Status of Unit ALLOCATED     Transfusion Status OK TO TRANSFUSE    POCT I-STAT 3, BLOOD GAS (G3P V)     Status: Abnormal   Collection Time   03/25/12  1:41 AM      Component Value Range   pH, Ven 7.283  7.250 - 7.300   pCO2,  Ven 47.3  45.0 - 50.0 mmHg   pO2, Ven 21.0 (*) 30.0 - 45.0 mmHg   Bicarbonate 22.4  20.0 - 24.0 mEq/L   TCO2 24  0 - 100 mmol/L   O2 Saturation 29.0     Acid-base deficit 4.0 (*) 0.0 - 2.0 mmol/L   Patient temperature 37.0 C     Sample type VENOUS     Comment NOTIFIED PHYSICIAN    CBC     Status: Abnormal   Collection Time   03/25/12  2:51 AM      Component Value Range   WBC 28.2 (*) 4.0 - 10.5 K/uL   RBC 2.81 (*) 4.22 - 5.81 MIL/uL   Hemoglobin 9.9 (*) 13.0 - 17.0 g/dL   HCT 16.1 (*) 09.6 - 04.5 %   MCV 103.2 (*) 78.0 - 100.0 fL   MCH 35.2 (*) 26.0 - 34.0 pg   MCHC 34.1  30.0 - 36.0 g/dL   RDW 40.9 (*) 81.1 - 91.4 %   Platelets 239  150 - 400 K/uL  LACTIC ACID, PLASMA     Status: Normal   Collection Time   03/25/12  4:45 AM      Component Value Range   Lactic Acid, Venous 1.9  0.5 - 2.2 mmol/L  POCT I-STAT TROPONIN I     Status: Normal   Collection Time   03/25/12  5:42 AM      Component Value Range   Troponin i, poc 0.02  0.00 - 0.08 ng/mL   Comment 3           CBC     Status: Abnormal   Collection Time   03/25/12  5:52 AM      Component Value Range   WBC 24.5 (*) 4.0 - 10.5 K/uL   RBC 2.37 (*) 4.22 - 5.81 MIL/uL   Hemoglobin 8.2 (*) 13.0 - 17.0 g/dL   HCT 78.2 (*) 95.6 - 21.3 %   MCV 104.2 (*) 78.0 - 100.0 fL   MCH 34.6 (*) 26.0 - 34.0 pg   MCHC 33.2  30.0 - 36.0 g/dL   RDW 08.6 (*) 57.8 - 46.9 %   Platelets 231  150 - 400 K/uL  PREPARE RBC (CROSSMATCH)     Status: Normal   Collection Time   03/25/12  7:00 AM      Component Value Range   Order Confirmation ORDER PROCESSED BY BLOOD BANK     EKG: nsr, no acute ischemic changes, normal intervals, normal axis, normal qrs complex  Chest x-ray cardiomegaly with pleural effusion, pulmonary infiltrates, pneumothorax.  Ct Angio Pelvis W/cm &/or Wo/cm  03/25/2012  *RADIOLOGY REPORT*  Clinical Data:  Nausea, vomiting, and diarrhea.  Hypotension.  CT ANGIOGRAPHY ABDOMEN AND PELVIS  Technique:  Multidetector CT imaging  of the abdomen and pelvis was performed using the standard protocol during bolus administration of intravenous contrast.  Multiplanar reconstructed images including MIPs were obtained and reviewed to evaluate the vascular anatomy.  Contrast: OMNIPAQUE IOHEXOL 350 MG/ML SOLN  Comparison:  11/18/2010  Findings:  There is a large subcapsular hematoma involving the right kidney with compression of the  renal parenchyma.  There is hemorrhage and hematoma in the right pararenal fascia and fat and extending into the adjacent soft tissues, extending down along the pericolic gutter into the right pelvis.  The differential diagnosis includes a ruptured renal cell carcinoma versus laceration or renal arterial injury.  No renal hematoma measures about 13.5 x 11.4 cm transverse dimension.  NG tube in the stomach.  Apparent postoperative changes in the stomach.  The liver, spleen, contracted gallbladder, pancreas, left kidney, abdominal aorta, and retroperitoneal lymph nodes are unremarkable.  IVC filter.  The stomach, small bowel, and colon are not abnormally distended.  There is a small anterior abdominal wall hernia containing transverse colon without bowel distension.  No free air in the abdomen.  Pelvis:  Right pelvic hematoma extending down from the right kidney as previously described.  This appears to be extraperitoneal.  The bladder is decompressed.  The prostate gland is not enlarged.  No significant pelvic lymphadenopathy.  Degenerative changes in the spine.  Postoperative changes in the left hip.   Review of the MIP images confirms the above findings.  IMPRESSION: Large subcapsular and retroperitoneal hematoma involving the right kidney and pararenal tissues.  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  Findings were discussed with Dr. Lavella Lemons at 0536 hours on 03/25/2012.   Original Report Authenticated By: Marlon Pel,  M.D.    Ct Angio Abdomen W/cm &/or Wo Contrast  03/25/2012  *RADIOLOGY REPORT*  Clinical Data:  Nausea, vomiting, and diarrhea.  Hypotension.  CT ANGIOGRAPHY ABDOMEN AND PELVIS  Technique:  Multidetector CT imaging of the abdomen and pelvis was performed using the standard protocol during bolus administration of intravenous contrast.  Multiplanar reconstructed images including MIPs were obtained and reviewed to evaluate the vascular anatomy.  Contrast: OMNIPAQUE IOHEXOL 350 MG/ML SOLN  Comparison:  11/18/2010  Findings:  There is a large subcapsular hematoma involving the right kidney with compression of the renal parenchyma.  There is hemorrhage and hematoma in the right pararenal fascia and fat and extending into the adjacent soft tissues, extending down along the pericolic gutter into the right pelvis.  The differential diagnosis includes a ruptured renal cell carcinoma versus laceration or renal arterial injury.  No renal hematoma measures about 13.5 x 11.4 cm transverse dimension.  NG tube in the stomach.  Apparent postoperative changes in the stomach.  The liver, spleen, contracted gallbladder, pancreas, left kidney, abdominal aorta, and retroperitoneal lymph nodes are unremarkable.  IVC filter.  The stomach, small bowel, and colon are not abnormally distended.  There is a small anterior abdominal wall hernia containing transverse colon without bowel distension.  No free air in the abdomen.  Pelvis:  Right pelvic hematoma extending down from the right kidney as previously described.  This appears to be extraperitoneal.  The bladder is decompressed.  The prostate gland is not enlarged.  No significant pelvic lymphadenopathy.  Degenerative changes in the spine.  Postoperative changes in the left hip.   Review of the MIP images confirms the above findings.  IMPRESSION: Large subcapsular and retroperitoneal hematoma involving the right kidney and pararenal tissues.  Changes most likely to represent  ruptured renal cell carcinoma although vascular injury or laceration can also give this appearance.  Findings were discussed with Dr. Lavella Lemons at 0536 hours on 03/25/2012.   Original Report Authenticated By: Marlon Pel, M.D.    Dg Chest Portable 1 View  03/25/2012  *RADIOLOGY  REPORT*  Clinical Data: Central line placement.  PORTABLE CHEST - 1 VIEW  Comparison: 03/25/2012 at 0108 hours.  Findings: Interval placement of a right central venous catheter. The tip is projected over the right upper mediastinum and is directed somewhat towards the left.  This suggest possible location at the origin of the brachial cephalic vein.  Arterial placement is not excluded.  No pneumothorax.  No developing pleural fluid collections.  Normal heart size and pulmonary vascularity.  No focal airspace consolidation.  IMPRESSION: Right central venous catheter is in the right upper mediastinum directed towards the left, possibly representing location in the origin of the brachial cephalic vein although arterial placement is not excluded.   Original Report Authenticated By: Marlon Pel, M.D.    Dg Chest Portable 1 View  03/25/2012  *RADIOLOGY REPORT*  Clinical Data: Abdominal pain, nausea/vomiting/diarrhea  PORTABLE CHEST - 1 VIEW  Comparison: 08/24/2011  Findings: Cardiomegaly.  No frank interstitial edema.  Stable right infrahilar prominence, likely vascular.  No pleural effusion or pneumothorax.  IMPRESSION: Cardiomegaly.  No frank interstitial edema.   Original Report Authenticated By: Charline Bills, M.D.     MDM  Chronically ill patient anticoagulated and with end-stage renal disease presented to the emergency department with shock, reports of hematochezia and hematemesis and exquisite right lower quadrant abdominal tenderness.  Differential diagnosis includes abdominal sepsis, perforated the viscous/appendix, mesenteric ischemia, cardiogenic shock, hemorrhagic shock with GI bleeding, ruptured AAA.    Patient fluid resuscitated with total of 2L of NS over the course of first couple of hours. CVC place in right IJ with U/S guidance but appears to be in brachiocephalic take off on CXR. 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  NGT was placed and just a couple mL of coffee grounds returned, lavaged clear. Patient with INR of 2.1 which is being treated with Vit K and FFP.  Treated empirically with Invanz pending CT scan with concern for peritonitis/abdominal sepsis/ruptured viscous.  Clinically improved. Tachycardia improved. BPs generally improved, Patient feeling better but, with reports of persistent R back pain.   CT scan shows subcapsular hemorrhage/hematoma right kidney with suspicion for ruptured renal cell CA per Dr. Andria Meuse in Radiology. Patient large amount of right retroperitoneal hemorrhage. We are continuing to resuscitate. Rechecking INR after reversal efforts with FFP. Patient has been evaluated by Dr. Andrey Campanile who will follow patient but, believes that he will be best managed by the ICU team. I concur. Patient discussed with Dr. Francoise Ceo over the phone. Fellow has evaluated the patient in the ED and have accepted him to the ICU.  No further recommendations re: ED management.   Patient has required multiple and frequent evaluations once he was finally stabilized. Attempts to place arterial line unsuccessful - unable to access right radial artery.  At risk for flash pulmonary edema/respiratory failure with degree of volume resuscitation and missed dialysis yesterday.   PROCEDURE NOTE:   CVC placement.   Indication: inadequate peripheral access, potential need for vasopressors.   Consent: verbal consent obtained from patient, procedure deemed to be emergent in light of shock.   Patient had had multiple previous IJ and SCV CVCs in the past. Subclavian vein excluded as potential site in light of coagulopathy. Horrible femoral candidate in light of body habitus and risk of  infection. Right IJ sight selected. U/S guidance used to place triple lumen CVC using Seldinger technique. Able to access vein and thread wire approx 2/3 of the way until meeting resistance. Able to thread  catheter over wire but, not to full length. Good blood draw off all ports with excellent flush through all ports. CXR shows tips of catheter in brachiocephalic vein just prox to union with IVC. I suspect patient has some vascular stenosis secondary to previous procedures and that this is what made it hard to thread wire/catheter.  Case discussed briefly with Dr. Francoise Ceo who concurred that line safe to use but, she counseled against administration of vasopressors through this line.   No other complications.    CRITICAL CARE Performed by: Brandt Loosen   Total critical care time: 130 minutes.   Critical care time was exclusive of separately billable procedures and treating other patients.  Critical care was necessary to treat or prevent imminent or life-threatening deterioration.  Critical care was time spent personally by me on the following activities: development of treatment plan with patient and/or surrogate as well as nursing, discussions with consultants, evaluation of patient's response to treatment, examination of patient, obtaining history from patient or surrogate, ordering and performing treatments and interventions, ordering and review of laboratory studies, ordering and review of radiographic studies, pulse oximetry and re-evaluation of patient's condition.        Brandt Loosen, MD 03/25/12 [phone]

## 2012-03-25 NOTE — Consult Note (Addendum)
Consult requested by Dr. West Bali Diagnosis: right retroperitoneal and subcapsular hematoma  History of Present Illness:  Morbidly obese 46 year old man with multiple chronic medical problems including ESRD on HD (T, Th, Sat), history of DVT, anticoagulation with Coumadin, hypertension. The patient says he is nonambulatory as a result of left hip fracture in March 2013 which was treated surgically.   Presented to ED right lower quadrant abdominal pain with persistent nausea, vomiting. Denies fever. No history of similar symptoms. No GU symptoms. The patient is oliguric.   He had previously undergone a sleeve gastrectomy in 2011. It appears he had several postoperative complications. He states that he was taken back to the operating room for an abdominal washout. 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 He has also had a bout of acute cholecystitis in 2012 that was managed with cholecystostomy tubes. Preoperatively he states he weighed around 800 pounds and he states he now weighs around 325 pounds  The patient denies experiencing chest pain, shortness of breath or symptoms of near syncope.  Right-sided mid to low back pain was rated 10 over 10 and describes as sharp, aching and constant. Patient reports history of chronic back pain. Denies any new neurologic deficits.   Patient held his Coumadin over the weekend after routine INR check showed INR of "6 point something" per the clinic nurse who called the patient.   CT today - There is a large subcapsular hematoma involving the  right kidney with compression of the renal parenchyma. There is  hemorrhage and hematoma in the right pararenal fascia and fat and  extending into the adjacent soft tissues, extending down along the  pericolic gutter into the right pelvis.  I reviewed all the images. No renal mass was seen but it was stated in a DDx.   He is feeling better now. His pain is controlled and mild.      Past Medical  History  Diagnosis Date  . Hypertension   . GERD (gastroesophageal reflux disease)   . DVT (deep venous thrombosis)   . Renal disorder   . Complication of anesthesia     " I am hard  to wake up "  . Diabetes mellitus   . ESRD (end stage renal disease)    Past Surgical History  Procedure Date  . Gastric sleeve   . Kidney stones   . Dialysis port   . Compression hip screw 08/26/2011    Procedure: COMPRESSION HIP;  Surgeon: Velna Ochs, MD;  Location: [facility] OR;  Service: Orthopedics;  Laterality: Left;  DYNAMIC HIP SCREW COMPRESSION  . Ivc filter   . Cholecystostomy tube     Home Medications:  Prescriptions prior to admission  Medication Sig Dispense Refill  . ALPRAZolam (XANAX) 1 MG tablet Take 1 mg by mouth every 6 (six) hours as needed. anxiety      . carisoprodol (SOMA) 350 MG tablet Take 350 mg by mouth 3 (three) times daily.      Marland Kitchen epoetin alfa (EPOGEN,PROCRIT) 96045 UNIT/ML injection Inject 6,500 Units into the skin 3 (three) times a week. Receives at [facility]. Last visit 08/23/11      . escitalopram (LEXAPRO) 20 MG tablet Take 20 mg by mouth daily.      . multivitamin (RENA-VIT) TABS tablet Take 1 tablet by mouth daily.      Marland Kitchen omeprazole (PRILOSEC) 20 MG capsule Take 20 mg by mouth 2 (two) times daily.      Marland Kitchen oxyCODONE 60 MG  TB12 Take 1 tablet (60 mg total) by mouth every 12 (twelve) hours.  28 tablet  0  . sevelamer (RENVELA) 800 MG tablet Take 800-1,600 mg by mouth See admin instructions. Two capsules with each meal, and one capsule with each snack      . warfarin (COUMADIN) 1 MG tablet Take 1 mg by mouth daily. Takes with 3mg  and 10mg  = 14mg /dose      . warfarin (COUMADIN) 10 MG tablet Take 10 mg by mouth daily. Takes with 3mg  and 1mg  = 14mg /dose daily      . warfarin (COUMADIN) 3 MG tablet Take 3 mg by mouth daily. Takes with 1mg  and 10mg  for 14mg /dose       Allergies:  Allergies  Allergen Reactions  . Piperacillin Sod-Tazobactam So Hives    History reviewed.  No pertinent family history. Social History:  reports that he has been smoking Cigarettes.  He has never used smokeless tobacco. He reports that he does not drink alcohol or use illicit drugs.  ROS: A complete review of systems was performed.  All systems are negative except for pertinent findings as noted. @ROS @   Physical Exam:  Vital signs in last 24 hours: Temp:  [97.9 F (36.6 C)-98.5 F (36.9 C)] 98 F (36.7 C) (10/27 0515) Pulse Rate:  [100-118] 107  (10/27 1100) Resp:  [14-24] 17  (10/27 1100) BP: (50-104)/(24-64) 81/53 mmHg (10/27 0900) SpO2:  [89 %-100 %] 95 % (10/27 1100) Arterial Line BP: (74-99)/(41-54) 74/46 mmHg (10/27 1100) General:  Alert and oriented, No acute distress HEENT: Normocephalic, atraumatic Neck: No JVD or lymphadenopathy Cardiovascular: Regular rate and rhythm Lungs: Regular rate and effort Abdomen: Soft, mild-tender, nondistended, no abdominal masses Extremities: No edema Neurologic: Grossly intact  Laboratory Data:  Results for orders placed during the hospital encounter of 03/25/12 (from the past 24 hour(s))  COMPREHENSIVE METABOLIC PANEL     Status: Abnormal   Collection Time   03/25/12 12:54 AM      Component Value Range   Sodium 134 (*) 135 - 145 mEq/L   Potassium 5.1  3.5 - 5.1 mEq/L   Chloride 92 (*) 96 - 112 mEq/L   CO2 21  19 - 32 mEq/L   Glucose, Bld 163 (*) 70 - 99 mg/dL   BUN 81 (*) 6 - 23 mg/dL   Creatinine, Ser 1.61 (*) 0.50 - 1.35 mg/dL   Calcium 9.7  8.4 - 09.6 mg/dL   Total Protein 7.4  6.0 - 8.3 g/dL   Albumin 2.8 (*) 3.5 - 5.2 g/dL   AST 045 (*) 0 - 37 U/L   ALT 135 (*) 0 - 53 U/L   Alkaline Phosphatase 133 (*) 39 - 117 U/L   Total Bilirubin 0.4  0.3 - 1.2 mg/dL   GFR calc non Af Amer 9 (*) >90 mL/min   GFR calc Af Amer 10 (*) >90 mL/min  APTT     Status: Abnormal   Collection Time   03/25/12 12:54 AM      Component Value Range   aPTT 40 (*) 24 - 37 seconds  PROTIME-INR     Status: Abnormal   Collection Time    03/25/12 12:54 AM      Component Value Range   Prothrombin Time 23.2 (*) 11.6 - 15.2 seconds   INR 2.16 (*) 0.00 - 1.49  LACTIC ACID, PLASMA     Status: Abnormal   Collection Time   03/25/12 12:54 AM      Component Value  Range   Lactic Acid, Venous 3.5 (*) 0.5 - 2.2 mmol/L  CK TOTAL AND CKMB     Status: Abnormal   Collection Time   03/25/12 12:54 AM      Component Value Range   Total CK 35  7 - 232 U/L   CK, MB 4.2 (*) 0.3 - 4.0 ng/mL   Relative Index RELATIVE INDEX IS INVALID  0.0 - 2.5  POCT I-STAT TROPONIN I     Status: Normal   Collection Time   03/25/12 12:59 AM      Component Value Range   Troponin i, poc 0.02  0.00 - 0.08 ng/mL   Comment 3           POCT I-STAT, CHEM 8     Status: Abnormal   Collection Time   03/25/12  1:11 AM      Component Value Range   Sodium 135  135 - 145 mEq/L   Potassium 5.1  3.5 - 5.1 mEq/L   Chloride 101  96 - 112 mEq/L   BUN 83 (*) 6 - 23 mg/dL   Creatinine, Ser 4.54 (*) 0.50 - 1.35 mg/dL   Glucose, Bld 098 (*) 70 - 99 mg/dL   Calcium, Ion 1.19  1.47 - 1.23 mmol/L   TCO2 21  0 - 100 mmol/L   Hemoglobin 10.5 (*) 13.0 - 17.0 g/dL   HCT 82.9 (*) 56.2 - 13.0 %  TYPE AND SCREEN     Status: Normal (Preliminary result)   Collection Time   03/25/12  1:17 AM      Component Value Range   ABO/RH(D) O NEG     Antibody Screen NEG     Sample Expiration 03/28/2012     Unit Number [id number]     Blood Component Type RED CELLS,LR     Unit division 00     Status of Unit ISSUED     Transfusion Status OK TO TRANSFUSE     Crossmatch Result Compatible     Unit Number [id number]     Blood Component Type RED CELLS,LR     Unit division 00     Status of Unit ALLOCATED     Transfusion Status OK TO TRANSFUSE     Crossmatch Result Compatible     Unit Number [id number]     Blood Component Type RED CELLS,LR     Unit division 00     Status of Unit ALLOCATED     Transfusion Status OK TO TRANSFUSE     Crossmatch Result Compatible     Unit Number  [id number]     Blood Component Type RBC LR PHER1     Unit division 00     Status of Unit ISSUED     Transfusion Status OK TO TRANSFUSE     Crossmatch Result Compatible    MAGNESIUM     Status: Normal   Collection Time   03/25/12  1:33 AM      Component Value Range   Magnesium 2.0  1.5 - 2.5 mg/dL  CBC     Status: Abnormal   Collection Time   03/25/12  1:33 AM      Component Value Range   WBC 35.3 (*) 4.0 - 10.5 K/uL   RBC 2.89 (*) 4.22 - 5.81 MIL/uL   Hemoglobin 10.2 (*) 13.0 - 17.0 g/dL   HCT 51.8 (*) 84.1 - 66.0 %   MCV 103.1 (*) 78.0 - 100.0 fL   MCH 35.3 (*) 26.0 -  34.0 pg   MCHC 34.2  30.0 - 36.0 g/dL   RDW 62.1 (*) 30.8 - 65.7 %   Platelets 310  150 - 400 K/uL  PREPARE RBC (CROSSMATCH)     Status: Normal   Collection Time   03/25/12  1:34 AM      Component Value Range   Order Confirmation ORDER PROCESSED BY BLOOD BANK    PREPARE FRESH FROZEN PLASMA     Status: Normal (Preliminary result)   Collection Time   03/25/12  1:34 AM      Component Value Range   Unit Number [id number]     Blood Component Type THAWED PLASMA     Unit division 00     Status of Unit ISS'D ANOTH PT     Transfusion Status OK TO TRANSFUSE     Unit Number [id number]     Blood Component Type THAWED PLASMA     Unit division 00     Status of Unit ISS'D ANOTH PT     Transfusion Status OK TO TRANSFUSE     Unit Number [id number]     Blood Component Type THAWED PLASMA     Unit division 00     Status of Unit ISSUED     Transfusion Status OK TO TRANSFUSE     Unit Number [id number]     Blood Component Type THAWED PLASMA     Unit division 00     Status of Unit ISSUED     Transfusion Status OK TO TRANSFUSE     Unit Number [id number]     Blood Component Type THAWED PLASMA     Unit division 00     Status of Unit ALLOCATED     Transfusion Status OK TO TRANSFUSE     Unit Number [id number]     Blood Component Type THAWED PLASMA     Unit division 00     Status of Unit ALLOCATED      Transfusion Status OK TO TRANSFUSE    POCT I-STAT 3, BLOOD GAS (G3P V)     Status: Abnormal   Collection Time   03/25/12  1:41 AM      Component Value Range   pH, Ven 7.283  7.250 - 7.300   pCO2, Ven 47.3  45.0 - 50.0 mmHg   pO2, Ven 21.0 (*) 30.0 - 45.0 mmHg   Bicarbonate 22.4  20.0 - 24.0 mEq/L   TCO2 24  0 - 100 mmol/L   O2 Saturation 29.0     Acid-base deficit 4.0 (*) 0.0 - 2.0 mmol/L   Patient temperature 37.0 C     Sample type VENOUS     Comment NOTIFIED PHYSICIAN    CBC     Status: Abnormal   Collection Time   03/25/12  2:51 AM      Component Value Range   WBC 28.2 (*) 4.0 - 10.5 K/uL   RBC 2.81 (*) 4.22 - 5.81 MIL/uL   Hemoglobin 9.9 (*) 13.0 - 17.0 g/dL   HCT 32.3 (*) 55.7 - 32.2 %   MCV 103.2 (*) 78.0 - 100.0 fL   MCH 35.2 (*) 26.0 - 34.0 pg   MCHC 34.1  30.0 - 36.0 g/dL   RDW 02.5 (*) 42.7 - 06.2 %   Platelets 239  150 - 400 K/uL  LACTIC ACID, PLASMA     Status: Normal   Collection Time   03/25/12  4:45 AM      Component Value Range   Lactic  Acid, Venous 1.9  0.5 - 2.2 mmol/L  POCT I-STAT TROPONIN I     Status: Normal   Collection Time   03/25/12  5:42 AM      Component Value Range   Troponin i, poc 0.02  0.00 - 0.08 ng/mL   Comment 3           CBC     Status: Abnormal   Collection Time   03/25/12  5:52 AM      Component Value Range   WBC 24.5 (*) 4.0 - 10.5 K/uL   RBC 2.37 (*) 4.22 - 5.81 MIL/uL   Hemoglobin 8.2 (*) 13.0 - 17.0 g/dL   HCT 04.5 (*) 40.9 - 81.1 %   MCV 104.2 (*) 78.0 - 100.0 fL   MCH 34.6 (*) 26.0 - 34.0 pg   MCHC 33.2  30.0 - 36.0 g/dL   RDW 91.4 (*) 78.2 - 95.6 %   Platelets 231  150 - 400 K/uL  PREPARE RBC (CROSSMATCH)     Status: Normal   Collection Time   03/25/12  7:00 AM      Component Value Range   Order Confirmation ORDER PROCESSED BY BLOOD BANK    CBC     Status: Abnormal   Collection Time   03/25/12  8:23 AM      Component Value Range   WBC 28.6 (*) 4.0 - 10.5 K/uL   RBC 2.60 (*) 4.22 - 5.81 MIL/uL   Hemoglobin 8.6  (*) 13.0 - 17.0 g/dL   HCT 21.3 (*) 08.6 - 57.8 %   MCV 98.1  78.0 - 100.0 fL   MCH 33.1  26.0 - 34.0 pg   MCHC 33.7  30.0 - 36.0 g/dL   RDW 46.9 (*) 62.9 - 52.8 %   Platelets 246  150 - 400 K/uL  APTT     Status: Normal   Collection Time   03/25/12  8:23 AM      Component Value Range   aPTT 35  24 - 37 seconds  MRSA PCR SCREENING     Status: Normal   Collection Time   03/25/12  9:00 AM      Component Value Range   MRSA by PCR NEGATIVE  NEGATIVE   Recent Results (from the past 240 hour(s))  MRSA PCR SCREENING     Status: Normal   Collection Time   03/25/12  9:00 AM      Component Value Range Status Comment   MRSA by PCR NEGATIVE  NEGATIVE Final    Creatinine:  Basename 03/25/12 0111 03/25/12 0054  CREATININE 6.30* 6.85*    Impression/Assessment:  Assessment/Plan:  Large right kidney subcapsular hematoma  Right retroperitoneal hematoma  ABL anemia  Hypotension  Shock - hypovolemic vs sepsis  Elevated WBC  ESRD  Morbid obesity  Nausea,vomiting, diarrhea  Elevated transaminases   Plan:  It appears he has bleeding GU tracts due to INR near 6. Would continue to treat medically. If H/H does not stabilize repeat CT could be performed to determine if RP hematoma is enlarging. If so, selective or total embolization of right kidney could be performed.   I discussed with the patient and his wife a kidney without a mass can bleed but a kidney with a mass can also bleed. Therefore it is important to get a f/u image/CT in 6 weeks or so to evaluate the right kidney for a mass and progression of hematoma resolution. I gave him my card and contact information  and discussed again importance of f/u.   Antony Haste 03/25/2012, 2:03PM

## 2012-03-25 NOTE — ED Notes (Signed)
Received verbal order from EDP Manly to bolus pt's plasma due to pt's retroperitoneal bleed.  Bolus now infusing.

## 2012-03-25 NOTE — ED Notes (Addendum)
X-ray in with perfoming stat portable chest to confirm central line placement

## 2012-03-25 NOTE — H&P (Signed)
Name: [name] MRN: [id number] DOB: 1966/03/13    LOS: 0   PULMONARY / CRITICAL CARE MEDICINE H&P  HPI:  [name] with ESRD, chronic pain and h/o DVT who presented to the ED with RLQ pain and hypotension.  He has held his coumadin since Thursday as he was told his INR was greater than 6.  Yesterday he was feeling weak with nausea, vomiting and  RLQ pain.   His chronic back pain has worsened.  He reports small amounts of blood in his emesis, however in the ED no blood was noted on lavage and was guiac negative on rectal.  CT abdomen and pelvis done in the ED showing right retroperitoneal hematoma.  He denies fever, chills, SOB, or chest pain.  In ED he received 2 L NS, 2 units FFP, vitamin K, and DDAVP.  Blood pressure remained labile with SBP ranging from 50s-90s.  A line attempted to better assess BP but failed after 3 attempts.  Past Medical History  Diagnosis Date  . Hypertension   . GERD (gastroesophageal reflux disease)   . DVT (deep venous thrombosis)   . Renal disorder   . Complication of anesthesia     " I am hard  to wake up "  . Diabetes mellitus   . ESRD (end stage renal disease)    Past Surgical History  Procedure Date  . Gastric sleeve   . Kidney stones   . Dialysis port   . Compression hip screw 08/26/2011    Procedure: COMPRESSION HIP;  Surgeon: Velna Ochs, MD;  Location: [facility] OR;  Service: Orthopedics;  Laterality: Left;  DYNAMIC HIP SCREW COMPRESSION  . Ivc filter   . Cholecystostomy tube    Prior to Admission medications   Medication Sig Start Date End Date Taking? Authorizing Provider  ALPRAZolam Prudy Feeler) 1 MG tablet Take 1 mg by mouth every 6 (six) hours as needed. anxiety   Yes Historical Provider, MD  carisoprodol (SOMA) 350 MG tablet Take 350 mg by mouth 3 (three) times daily.   Yes Historical Provider, MD  epoetin alfa (EPOGEN,PROCRIT) 40981 UNIT/ML injection Inject 6,500 Units into the skin 3 (three) times a week. Receives at [facility]. Last  visit 08/23/11   Yes Historical Provider, MD  escitalopram (LEXAPRO) 20 MG tablet Take 20 mg by mouth daily.   Yes Historical Provider, MD  multivitamin (RENA-VIT) TABS tablet Take 1 tablet by mouth daily.   Yes Historical Provider, MD  omeprazole (PRILOSEC) 20 MG capsule Take 20 mg by mouth 2 (two) times daily.   Yes Historical Provider, MD  oxyCODONE 60 MG TB12 Take 1 tablet (60 mg total) by mouth every 12 (twelve) hours. 09/09/11  Yes Mcarthur Rossetti Angiulli, PA  sevelamer (RENVELA) 800 MG tablet Take 800-1,600 mg by mouth See admin instructions. Two capsules with each meal, and one capsule with each snack   Yes Historical Provider, MD  warfarin (COUMADIN) 1 MG tablet Take 1 mg by mouth daily. Takes with 3mg  and 10mg  = 14mg /dose   Yes Historical Provider, MD  warfarin (COUMADIN) 10 MG tablet Take 10 mg by mouth daily. Takes with 3mg  and 1mg  = 14mg /dose daily 09/09/11 09/08/12 Yes Daniel J Angiulli, PA  warfarin (COUMADIN) 3 MG tablet Take 3 mg by mouth daily. Takes with 1mg  and 10mg  for 14mg /dose   Yes Historical Provider, MD   Allergies Allergies  Allergen Reactions  . Piperacillin Sod-Tazobactam So Hives    Family History History reviewed. No pertinent family  history. Social History  reports that he has been smoking Cigarettes.  He has never used smokeless tobacco. He reports that he does not drink alcohol or use illicit drugs.  Review Of Systems:  Comeplete review of systems negative except as listed in HPI  Brief patient description:  [name] with ESRD and on coumadin for h/o DVT with retroperitoneal bleed.  Events Since Admission: 10/27 Admit to ICU, retroperitoneal bleed on CT scan  Current Status:  Vital Signs: Temp:  [97.9 F (36.6 C)-98.5 F (36.9 C)] 98 F (36.7 C) (10/27 0515) Pulse Rate:  [100-118] 113  (10/27 0545) Resp:  [18-22] 20  (10/27 0545) BP: (50-104)/(24-64) 85/33 mmHg (10/27 0628) SpO2:  [89 %-100 %] 89 % (10/27 0545)  Physical Examination: General:   NAD Neuro:  Awake, alert, moves all 4 extremities HEENT:  PERRL, sclera clear, MMM Neck:  Thick, IJ in place Cardiovascular:  RRR, no m/r/g Lungs:  CTAB, nl WOB Abdomen:  Obese, moderate RLQ pain, no guarding Musculoskeletal:  Joints wnl Skin:  No rash  Active Problems:  * No active hospital problems. *    ASSESSMENT AND PLAN  PULMONARY  Lab 03/25/12 0141  PHART --  PCO2ART --  PO2ART --  HCO3 22.4  O2SAT 29.0   Ventilator Settings:   CXR: No infiltrates or edema ETT:  n/a  A:  No issues P:   - Watch for evidence of fluid overload with IVF and blood products and ESRD  CARDIOVASCULAR  Lab 03/25/12 0445 03/25/12 0054  TROPONINI -- --  LATICACIDVEN 1.9 3.5*  PROBNP -- --   ECG:  Sinus tachycardia, rate 133 Lines:RIJ, may be at brachiocephalic junction  A: Hypotension 2/2 hemorrhagic shock, although BP reading may be unreliable P:  - S/p FFP, blood and IVF - Will re-try to place aline - Repeat coags and CBC. Cycle CBC Q6  RENAL  Lab 03/25/12 0133 03/25/12 0111 03/25/12 0054  NA -- 135 134*  K -- 5.1 5.1  CL -- 101 92*  CO2 -- -- 21  BUN -- 83* 81*  CREATININE -- 6.30* 6.85*  CALCIUM -- -- 9.7  MG 2.0 -- --  PHOS -- -- --   Intake/Output      10/26 0701 - 10/27 0700 10/27 0701 - 10/28 0700   I.V. 1500    Blood 1324.5    Total Intake 2824.5    Net +2824.5           A:  ESRD P:   - Missed HD. No acute indications - Consult nephro in the AM  GASTROINTESTINAL  Lab 03/25/12 0054  AST 144*  ALT 135*  ALKPHOS 133*  BILITOT 0.4  PROT 7.4  ALBUMIN 2.8*    A:  Mild transaminitis, appears chronic P:   - NPO until clinically stable   HEMATOLOGIC  Lab 03/25/12 0552 03/25/12 0251 03/25/12 0133 03/25/12 0111 03/25/12 0054  HGB 8.2* 9.9* 10.2* 10.5* --  HCT 24.7* 29.0* 29.8* 31.0* --  PLT 231 239 310 -- --  INR -- -- -- -- 2.16*  APTT -- -- -- -- 40*   A:  H/o DVTs, HGB 10.2->8.2 with retroperitoneal hematoma in setting of  supratherapuetic INR P:  -BLood products as above - Cycle CBC - reverse INR, DVT distant  INFECTIOUS  Lab 03/25/12 0552 03/25/12 0251 03/25/12 0133  WBC 24.5* 28.2* 35.3*  PROCALCITON -- -- --   Cultures: BLood Cx x2 10/27 >>> Antibiotics: Ertapenem x 1 in ED  A:  Leukocytosis likely related to bleed. No evidence of infection P:   - Ertapenem x 1 given - Hold further antibiotics - Blood cultures pending    NEUROLOGIC  A: Chronic pain and acute pain from bleed P:   - Home oxycontine - PRN morphine  BEST PRACTICE / DISPOSITION Level of Care:  ICU Primary Service:  PCCM Consultants:  Surgery Code Status:  Full Diet:  NPO DVT Px:  SCDs GI Px:  Protonix Skin Integrity:  No issues Social / Family:  Family not present  CC time 65 min  Tametha Banning, M.D. Pulmonary and Critical Care Medicine [facility] Pager: [phone]  03/25/2012, 7:25 AM

## 2012-03-25 NOTE — Procedures (Signed)
Arterial Catheter Insertion Procedure Note [name] [id number] 01/02/1966  Procedure: Insertion of Arterial Catheter  Indications: Blood pressure monitoring  Procedure Details Consent: Risks of procedure as well as the alternatives and risks of each were explained to the (patient/caregiver).  Consent for procedure obtained. Time Out: Verified patient identification, verified procedure, site/side was marked, verified correct patient position, special equipment/implants available, medications/allergies/relevent history reviewed, required imaging and test results available.  Performed  Maximum sterile technique was used including antiseptics, gloves, hand hygiene and sheet. Skin prep: Chlorhexidine; local anesthetic administered 20 gauge catheter was inserted into right radial artery using the Seldinger technique.  Evaluation Blood flow good; BP tracing good. Complications: No apparent complications.   [name] 03/25/2012

## 2012-03-25 NOTE — Progress Notes (Signed)
Spoke with Dr. Craige Cotta concerning placement of right IJ central line.  After looking at xray, he approved use for any infusion needed through line.

## 2012-03-25 NOTE — ED Notes (Signed)
RT in with pt inserting ART line

## 2012-03-25 NOTE — Consult Note (Signed)
Reason for Consult:retroperitoneal hematoma Referring Physician: Dr [name] is an 46 y.o. male.  HPI: 46 year old morbidly obese male came to the ER earlier this evening complaining of right lateral abdominal and lower back pain. He had also been having nausea vomiting and diarrhea since 3 AM on Saturday. Initially his emesis was nonbloody nonbilious but the last 2 episodes of emesis were black tinged. He also reports a little bit of blood per rectum. He was found to be tachycardic and hypotensive. He states he had been on antibiotics recently for an episode of bronchitis. He states he had also been on a prednisone pack for the bronchitis. He states he was started on prednisone about 3 weeks ago and his last dose was earlier this week. He denies any recent fevers or chills. He states that he normally has normal bowel movements. Reportedly his INR was around 6 on Thursday. He was told to hold his Coumadin.   He had previously undergone a sleeve gastrectomy at [facility] in 2011. It appears he had several postoperative complications. He states that he was taken back to the operating room for an abdominal washout. 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 He has also had a bout of acute cholecystitis in 2012 that was managed with cholecystostomy tubes. Preoperatively he states he weighed around 800 pounds and he states he now weighs around 325 pounds  Past Medical History  Diagnosis Date  . Hypertension   . GERD (gastroesophageal reflux disease)   . DVT (deep venous thrombosis)   . Renal disorder   . Complication of anesthesia     " I am hard  to wake up "  . Diabetes mellitus   . ESRD (end stage renal disease)     Past Surgical History  Procedure Date  . Gastric sleeve   . Kidney stones   . Dialysis port   . Compression hip screw 08/26/2011    Procedure: COMPRESSION HIP;  Surgeon: Velna Ochs, MD;  Location: [facility] OR;  Service: Orthopedics;   Laterality: Left;  DYNAMIC HIP SCREW COMPRESSION  . Ivc filter   . Cholecystostomy tube     History reviewed. No pertinent family history.  Social History:  reports that he has been smoking Cigarettes.  He has never used smokeless tobacco. He reports that he does not drink alcohol or use illicit drugs. Tells me no smoking currently Allergies:  Allergies  Allergen Reactions  . Piperacillin Sod-Tazobactam So Hives    Medications: I have reviewed the patient's current medications.  Results for orders placed during the hospital encounter of 03/25/12 (from the past 48 hour(s))  COMPREHENSIVE METABOLIC PANEL     Status: Abnormal   Collection Time   03/25/12 12:54 AM      Component Value Range Comment   Sodium 134 (*) 135 - 145 mEq/L    Potassium 5.1  3.5 - 5.1 mEq/L    Chloride 92 (*) 96 - 112 mEq/L    CO2 21  19 - 32 mEq/L    Glucose, Bld 163 (*) 70 - 99 mg/dL    BUN 81 (*) 6 - 23 mg/dL    Creatinine, Ser 1.61 (*) 0.50 - 1.35 mg/dL    Calcium 9.7  8.4 - 09.6 mg/dL    Total Protein 7.4  6.0 - 8.3 g/dL    Albumin 2.8 (*) 3.5 - 5.2 g/dL    AST 045 (*) 0 - 37 U/L    ALT 135 (*)  0 - 53 U/L    Alkaline Phosphatase 133 (*) 39 - 117 U/L    Total Bilirubin 0.4  0.3 - 1.2 mg/dL    GFR calc non Af Amer 9 (*) >90 mL/min    GFR calc Af Amer 10 (*) >90 mL/min   APTT     Status: Abnormal   Collection Time   03/25/12 12:54 AM      Component Value Range Comment   aPTT 40 (*) 24 - 37 seconds   PROTIME-INR     Status: Abnormal   Collection Time   03/25/12 12:54 AM      Component Value Range Comment   Prothrombin Time 23.2 (*) 11.6 - 15.2 seconds    INR 2.16 (*) 0.00 - 1.49   LACTIC ACID, PLASMA     Status: Abnormal   Collection Time   03/25/12 12:54 AM      Component Value Range Comment   Lactic Acid, Venous 3.5 (*) 0.5 - 2.2 mmol/L   CK TOTAL AND CKMB     Status: Abnormal   Collection Time   03/25/12 12:54 AM      Component Value Range Comment   Total CK 35  7 - 232 U/L    CK, MB  4.2 (*) 0.3 - 4.0 ng/mL    Relative Index RELATIVE INDEX IS INVALID  0.0 - 2.5   POCT I-STAT TROPONIN I     Status: Normal   Collection Time   03/25/12 12:59 AM      Component Value Range Comment   Troponin i, poc 0.02  0.00 - 0.08 ng/mL    Comment 3            POCT I-STAT, CHEM 8     Status: Abnormal   Collection Time   03/25/12  1:11 AM      Component Value Range Comment   Sodium 135  135 - 145 mEq/L    Potassium 5.1  3.5 - 5.1 mEq/L    Chloride 101  96 - 112 mEq/L    BUN 83 (*) 6 - 23 mg/dL    Creatinine, Ser 4.54 (*) 0.50 - 1.35 mg/dL    Glucose, Bld 098 (*) 70 - 99 mg/dL    Calcium, Ion 1.19  1.47 - 1.23 mmol/L    TCO2 21  0 - 100 mmol/L    Hemoglobin 10.5 (*) 13.0 - 17.0 g/dL    HCT 82.9 (*) 56.2 - 52.0 %   TYPE AND SCREEN     Status: Normal (Preliminary result)   Collection Time   03/25/12  1:17 AM      Component Value Range Comment   ABO/RH(D) O NEG      Antibody Screen NEG      Sample Expiration 03/28/2012      Unit Number [id number]      Blood Component Type RED CELLS,LR      Unit division 00      Status of Unit ISSUED      Transfusion Status OK TO TRANSFUSE      Crossmatch Result Compatible      Unit Number [id number]      Blood Component Type RED CELLS,LR      Unit division 00      Status of Unit ALLOCATED      Transfusion Status OK TO TRANSFUSE      Crossmatch Result Compatible      Unit Number [id number]      Blood Component Type RED  CELLS,LR      Unit division 00      Status of Unit ALLOCATED      Transfusion Status OK TO TRANSFUSE      Crossmatch Result Compatible      Unit Number [id number]      Blood Component Type RBC LR PHER1      Unit division 00      Status of Unit ISSUED      Transfusion Status OK TO TRANSFUSE      Crossmatch Result Compatible     MAGNESIUM     Status: Normal   Collection Time   03/25/12  1:33 AM      Component Value Range Comment   Magnesium 2.0  1.5 - 2.5 mg/dL   CBC     Status: Abnormal   Collection Time    03/25/12  1:33 AM      Component Value Range Comment   WBC 35.3 (*) 4.0 - 10.5 K/uL    RBC 2.89 (*) 4.22 - 5.81 MIL/uL    Hemoglobin 10.2 (*) 13.0 - 17.0 g/dL    HCT 11.9 (*) 14.7 - 52.0 %    MCV 103.1 (*) 78.0 - 100.0 fL    MCH 35.3 (*) 26.0 - 34.0 pg    MCHC 34.2  30.0 - 36.0 g/dL    RDW 82.9 (*) 56.2 - 15.5 %    Platelets 310  150 - 400 K/uL   PREPARE RBC (CROSSMATCH)     Status: Normal   Collection Time   03/25/12  1:34 AM      Component Value Range Comment   Order Confirmation ORDER PROCESSED BY BLOOD BANK     PREPARE FRESH FROZEN PLASMA     Status: Normal (Preliminary result)   Collection Time   03/25/12  1:34 AM      Component Value Range Comment   Unit Number [id number]      Blood Component Type THAWED PLASMA      Unit division 00      Status of Unit ISS'D ANOTH PT      Transfusion Status OK TO TRANSFUSE      Unit Number [id number]      Blood Component Type THAWED PLASMA      Unit division 00      Status of Unit ISS'D ANOTH PT      Transfusion Status OK TO TRANSFUSE      Unit Number [id number]      Blood Component Type THAWED PLASMA      Unit division 00      Status of Unit ISSUED      Transfusion Status OK TO TRANSFUSE      Unit Number [id number]      Blood Component Type THAWED PLASMA      Unit division 00      Status of Unit ISSUED      Transfusion Status OK TO TRANSFUSE      Unit Number [id number]      Blood Component Type THAWED PLASMA      Unit division 00      Status of Unit ALLOCATED      Transfusion Status OK TO TRANSFUSE      Unit Number [id number]      Blood Component Type THAWED PLASMA      Unit division 00      Status of Unit ALLOCATED      Transfusion Status OK TO TRANSFUSE     POCT I-STAT 3, BLOOD  GAS (G3P V)     Status: Abnormal   Collection Time   03/25/12  1:41 AM      Component Value Range Comment   pH, Ven 7.283  7.250 - 7.300    pCO2, Ven 47.3  45.0 - 50.0 mmHg    pO2, Ven 21.0 (*) 30.0 - 45.0 mmHg    Bicarbonate 22.4   20.0 - 24.0 mEq/L    TCO2 24  0 - 100 mmol/L    O2 Saturation 29.0      Acid-base deficit 4.0 (*) 0.0 - 2.0 mmol/L    Patient temperature 37.0 C      Sample type VENOUS      Comment NOTIFIED PHYSICIAN     CBC     Status: Abnormal   Collection Time   03/25/12  2:51 AM      Component Value Range Comment   WBC 28.2 (*) 4.0 - 10.5 K/uL    RBC 2.81 (*) 4.22 - 5.81 MIL/uL    Hemoglobin 9.9 (*) 13.0 - 17.0 g/dL    HCT 40.9 (*) 81.1 - 52.0 %    MCV 103.2 (*) 78.0 - 100.0 fL    MCH 35.2 (*) 26.0 - 34.0 pg    MCHC 34.1  30.0 - 36.0 g/dL    RDW 91.4 (*) 78.2 - 15.5 %    Platelets 239  150 - 400 K/uL REPEATED TO VERIFY  LACTIC ACID, PLASMA     Status: Normal   Collection Time   03/25/12  4:45 AM      Component Value Range Comment   Lactic Acid, Venous 1.9  0.5 - 2.2 mmol/L   POCT I-STAT TROPONIN I     Status: Normal   Collection Time   03/25/12  5:42 AM      Component Value Range Comment   Troponin i, poc 0.02  0.00 - 0.08 ng/mL    Comment 3            CBC     Status: Abnormal   Collection Time   03/25/12  5:52 AM      Component Value Range Comment   WBC 24.5 (*) 4.0 - 10.5 K/uL    RBC 2.37 (*) 4.22 - 5.81 MIL/uL    Hemoglobin 8.2 (*) 13.0 - 17.0 g/dL DELTA CHECK NOTED   HCT 24.7 (*) 39.0 - 52.0 %    MCV 104.2 (*) 78.0 - 100.0 fL    MCH 34.6 (*) 26.0 - 34.0 pg    MCHC 33.2  30.0 - 36.0 g/dL    RDW 95.6 (*) 21.3 - 15.5 %    Platelets 231  150 - 400 K/uL   PREPARE RBC (CROSSMATCH)     Status: Normal   Collection Time   03/25/12  7:00 AM      Component Value Range Comment   Order Confirmation ORDER PROCESSED BY BLOOD BANK       Ct Angio Pelvis W/cm &/or Wo/cm  03/25/2012  *RADIOLOGY REPORT*  Clinical Data:  Nausea, vomiting, and diarrhea.  Hypotension.  CT ANGIOGRAPHY ABDOMEN AND PELVIS  Technique:  Multidetector CT imaging of the abdomen and pelvis was performed using the standard protocol during bolus administration of intravenous contrast.  Multiplanar reconstructed images  including MIPs were obtained and reviewed to evaluate the vascular anatomy.  Contrast: OMNIPAQUE IOHEXOL 350 MG/ML SOLN  Comparison:  11/18/2010  Findings:  There is a large subcapsular hematoma involving the right kidney with compression of the renal  parenchyma.  There is hemorrhage and hematoma in the right pararenal fascia and fat and extending into the adjacent soft tissues, extending down along the pericolic gutter into the right pelvis.  The differential diagnosis includes a ruptured renal cell carcinoma versus laceration or renal arterial injury.  No renal hematoma measures about 13.5 x 11.4 cm transverse dimension.  NG tube in the stomach.  Apparent postoperative changes in the stomach.  The liver, spleen, contracted gallbladder, pancreas, left kidney, abdominal aorta, and retroperitoneal lymph nodes are unremarkable.  IVC filter.  The stomach, small bowel, and colon are not abnormally distended.  There is a small anterior abdominal wall hernia containing transverse colon without bowel distension.  No free air in the abdomen.  Pelvis:  Right pelvic hematoma extending down from the right kidney as previously described.  This appears to be extraperitoneal.  The bladder is decompressed.  The prostate gland is not enlarged.  No significant pelvic lymphadenopathy.  Degenerative changes in the spine.  Postoperative changes in the left hip.   Review of the MIP images confirms the above findings.  IMPRESSION: Large subcapsular and retroperitoneal hematoma involving the right kidney and pararenal tissues.  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  Findings were discussed with Jeremy. Lavella Lemons at 0536 hours on 03/25/2012.   Original Report Authenticated By: Marlon Pel, M.D.    Ct Angio Abdomen W/cm &/or Wo Contrast  03/25/2012  *RADIOLOGY REPORT*  Clinical Data:  Nausea, vomiting, and diarrhea.  Hypotension.  CT  ANGIOGRAPHY ABDOMEN AND PELVIS  Technique:  Multidetector CT imaging of the abdomen and pelvis was performed using the standard protocol during bolus administration of intravenous contrast.  Multiplanar reconstructed images including MIPs were obtained and reviewed to evaluate the vascular anatomy.  Contrast: OMNIPAQUE IOHEXOL 350 MG/ML SOLN  Comparison:  11/18/2010  Findings:  There is a large subcapsular hematoma involving the right kidney with compression of the renal parenchyma.  There is hemorrhage and hematoma in the right pararenal fascia and fat and extending into the adjacent soft tissues, extending down along the pericolic gutter into the right pelvis.  The differential diagnosis includes a ruptured renal cell carcinoma versus laceration or renal arterial injury.  No renal hematoma measures about 13.5 x 11.4 cm transverse dimension.  NG tube in the stomach.  Apparent postoperative changes in the stomach.  The liver, spleen, contracted gallbladder, pancreas, left kidney, abdominal aorta, and retroperitoneal lymph nodes are unremarkable.  IVC filter.  The stomach, small bowel, and colon are not abnormally distended.  There is a small anterior abdominal wall hernia containing transverse colon without bowel distension.  No free air in the abdomen.  Pelvis:  Right pelvic hematoma extending down from the right kidney as previously described.  This appears to be extraperitoneal.  The bladder is decompressed.  The prostate gland is not enlarged.  No significant pelvic lymphadenopathy.  Degenerative changes in the spine.  Postoperative changes in the left hip.   Review of the MIP images confirms the above findings.  IMPRESSION: Large subcapsular and retroperitoneal hematoma involving the right kidney and pararenal tissues.  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  Findings were discussed with Jeremy. Lavella Lemons at 0536 hours  on 03/25/2012.   Original Report Authenticated By: Marlon Pel, M.D.    Dg Chest Portable 1 View  03/25/2012  *RADIOLOGY REPORT*  Clinical Data: Central line placement.  PORTABLE CHEST - 1 VIEW  Comparison: 03/25/2012 at 0108 hours.  Findings: Interval placement of a right central venous catheter. The tip is projected over the right upper mediastinum and is directed somewhat towards the left.  This suggest possible location at the origin of the brachial cephalic vein.  Arterial placement is not excluded.  No pneumothorax.  No developing pleural fluid collections.  Normal heart size and pulmonary vascularity.  No focal airspace consolidation.  IMPRESSION: Right central venous catheter is in the right upper mediastinum directed towards the left, possibly representing location in the origin of the brachial cephalic vein although arterial placement is not excluded.   Original Report Authenticated By: Marlon Pel, M.D.    Dg Chest Portable 1 View  03/25/2012  *RADIOLOGY REPORT*  Clinical Data: Abdominal pain, nausea/vomiting/diarrhea  PORTABLE CHEST - 1 VIEW  Comparison: 08/24/2011  Findings: Cardiomegaly.  No frank interstitial edema.  Stable right infrahilar prominence, likely vascular.  No pleural effusion or pneumothorax.  IMPRESSION: Cardiomegaly.  No frank interstitial edema.   Original Report Authenticated By: Charline Bills, M.D.     Review of Systems  Constitutional: Negative for fever and chills.  HENT: Negative for hearing loss and nosebleeds.   Eyes: Negative for blurred vision and double vision.  Respiratory: Negative for shortness of breath.   Cardiovascular: Negative for chest pain.  Gastrointestinal: Positive for nausea, vomiting, abdominal pain, diarrhea and blood in stool.  Genitourinary:       Doesn't make hardly any urine  Musculoskeletal: Positive for back pain (right lower back).  Neurological: Positive for weakness. Negative for dizziness, focal weakness,  seizures, loss of consciousness and headaches.  Psychiatric/Behavioral: Negative for substance abuse.   Blood pressure 85/33, pulse 113, temperature 98 F (36.7 C), temperature source Oral, resp. rate 20, SpO2 89.00%. Physical Exam  Constitutional: He is oriented to person, place, and time. He appears well-developed. He is cooperative. He has a sickly appearance. Nasal cannula in place.  HENT:  Head: Normocephalic and atraumatic.  Right Ear: External ear normal.  Left Ear: External ear normal.  Eyes: Conjunctivae normal are normal. No scleral icterus.  Neck: Neck supple. No tracheal deviation present.  Cardiovascular: Regular rhythm and intact distal pulses.  Tachycardia present.   Respiratory: Effort normal and breath sounds normal. No respiratory distress.  GI: Soft. He exhibits no distension. There is tenderness in the right lower quadrant. There is no rigidity, no rebound and no guarding. A hernia is present. Hernia confirmed positive in the ventral area.    Musculoskeletal: He exhibits no edema.  Neurological: He is alert and oriented to person, place, and time.  Skin: Skin is warm. He is not diaphoretic. There is pallor.  Psychiatric: He has a normal mood and affect. His behavior is normal. Judgment and thought content normal.    Assessment/Plan: Large right kidney subcapsular hematoma ?right renal mass Right retroperitoneal hematoma ABL anemia Hypotension Shock - hypovolemic vs sepsis Elevated WBC ESRD Morbid obesity Nausea,vomiting, diarrhea Elevated transaminases  I recommend admission to the ICU to the [facility] medicine service. The patient needs to be typed and crossed and have 4 units available. I also recommend checking a DIC panel. There is no role for surgical intervention at the present moment. I recommend a urology consult to evaluate the right kidney as well as the possibility of a ruptured right renal cell carcinoma mass. I will keep n.p.o. It is unclear as to the  patient's etiology for elevated  white blood cell count. He did state he was on a prednisone pack for the past several weeks for bronchitis. However he stated that his last prednisone tablet was earlier this week. His diarrhea could be due to his recent antibiotic use. So I would check his stool for C. Difficile, ova and parasites. I would also check blood cultures.   Jeremy Sella. Andrey Campanile, MD, FACS General, Bariatric, & Minimally Invasive Surgery [facility] Surgery, [place]  [facility] M 03/25/2012, 6:52 AM

## 2012-03-25 NOTE — ED Notes (Signed)
Per EMS: pt n/v/d x 1 day. Pt dialysis pt did not go today. Pt hypotensive.

## 2012-03-25 NOTE — Progress Notes (Signed)
Called to place a-line by RN. Attempted x 2 and another RT attempted x 2, would not thread during all attempts. RN aware.

## 2012-03-26 DIAGNOSIS — D689 Coagulation defect, unspecified: Secondary | ICD-10-CM

## 2012-03-26 DIAGNOSIS — N186 End stage renal disease: Secondary | ICD-10-CM

## 2012-03-26 DIAGNOSIS — Z86718 Personal history of other venous thrombosis and embolism: Secondary | ICD-10-CM

## 2012-03-26 DIAGNOSIS — R578 Other shock: Secondary | ICD-10-CM

## 2012-03-26 DIAGNOSIS — D649 Anemia, unspecified: Secondary | ICD-10-CM

## 2012-03-26 DIAGNOSIS — R58 Hemorrhage, not elsewhere classified: Secondary | ICD-10-CM | POA: Diagnosis present

## 2012-03-26 LAB — CBC
HCT: 29 % — ABNORMAL LOW (ref 39.0–52.0)
HCT: 20.3 % — ABNORMAL LOW (ref 39.0–52.0)
Hemoglobin: 10 g/dL — ABNORMAL LOW (ref 13.0–17.0)
Hemoglobin: 7.9 g/dL — ABNORMAL LOW (ref 13.0–17.0)
MCH: 32.5 pg (ref 26.0–34.0)
MCH: 32.4 pg (ref 26.0–34.0)
MCHC: 33.9 g/dL (ref 30.0–36.0)
MCHC: 33 g/dL (ref 30.0–36.0)
MCV: 95.9 fL (ref 78.0–100.0)
MCV: 98.1 fL (ref 78.0–100.0)
Platelets: 153 10*3/uL (ref 150–400)
RBC: 3.02 MIL/uL — ABNORMAL LOW (ref 4.22–5.81)
RDW: 19.5 % — ABNORMAL HIGH (ref 11.5–15.5)
WBC: 43 10*3/uL — ABNORMAL HIGH (ref 4.0–10.5)

## 2012-03-26 LAB — BLOOD GAS, VENOUS
Acid-base deficit: 0.4 mmol/L (ref 0.0–2.0)
Patient temperature: 98.6
TCO2: 25.9 mmol/L (ref 0–100)
pCO2, Ven: 45.9 mmHg (ref 45.0–50.0)

## 2012-03-26 LAB — RENAL FUNCTION PANEL
Calcium: 8.9 mg/dL (ref 8.4–10.5)
Creatinine, Ser: 3.96 mg/dL — ABNORMAL HIGH (ref 0.50–1.35)
GFR calc Af Amer: 20 mL/min — ABNORMAL LOW (ref >90)
Glucose, Bld: 131 mg/dL — ABNORMAL HIGH (ref 70–99)
Phosphorus: 4.8 mg/dL — ABNORMAL HIGH (ref 2.3–4.6)
Sodium: 140 mEq/L (ref 135–145)

## 2012-03-26 LAB — BLOOD GAS, ARTERIAL
Acid-base deficit: 0.1 mmol/L (ref 0.0–2.0)
Drawn by: 36277
O2 Saturation: 90 %

## 2012-03-26 LAB — IRON AND TIBC
Saturation Ratios: 16 % — ABNORMAL LOW (ref 20–55)
UIBC: 158 ug/dL (ref 125–400)

## 2012-03-26 MED ORDER — LIDOCAINE HCL (PF) 1 % IJ SOLN: 5.0000 mL | INTRAMUSCULAR | Status: DC | PRN

## 2012-03-26 MED ORDER — SODIUM CHLORIDE 0.9 % IV SOLN: 100.0000 mL | INTRAVENOUS | Status: DC | PRN

## 2012-03-26 MED ORDER — ALTEPLASE 2 MG IJ SOLR
2.0000 mg | Freq: Once | INTRAMUSCULAR | Status: AC | PRN
  Filled 2012-03-26: qty 2

## 2012-03-26 MED ORDER — ONDANSETRON HCL 4 MG/2ML IJ SOLN
4.0000 mg | Freq: Four times a day (QID) | INTRAMUSCULAR | Status: DC | PRN
  Administered 2012-03-26 – 2012-04-04 (×10): 4 mg via INTRAVENOUS
  Filled 2012-03-26 (×10): qty 2

## 2012-03-26 MED ORDER — NEPRO/CARBSTEADY PO LIQD
237.0000 mL | ORAL | Status: DC | PRN
  Filled 2012-03-26: qty 237

## 2012-03-26 MED ORDER — PENTAFLUOROPROP-TETRAFLUOROETH EX AERO: 1.0000 "application " | INHALATION_SPRAY | CUTANEOUS | Status: DC | PRN

## 2012-03-26 MED ORDER — DARBEPOETIN ALFA-POLYSORBATE 200 MCG/0.4ML IJ SOLN
200.0000 ug | INTRAMUSCULAR | Status: DC
  Administered 2012-03-27 – 2012-04-03 (×2): 200 ug via INTRAVENOUS
  Filled 2012-03-26 (×2): qty 0.4

## 2012-03-26 MED ORDER — HEPARIN SODIUM (PORCINE) 1000 UNIT/ML DIALYSIS
1000.0000 [IU] | INTRAMUSCULAR | Status: DC | PRN
  Filled 2012-03-26: qty 1

## 2012-03-26 MED ORDER — PANTOPRAZOLE SODIUM 40 MG PO TBEC
40.0000 mg | DELAYED_RELEASE_TABLET | Freq: Every day | ORAL | Status: DC
  Administered 2012-03-26 – 2012-04-06 (×12): 40 mg via ORAL
  Filled 2012-03-26 (×12): qty 1

## 2012-03-26 MED ORDER — LIDOCAINE-PRILOCAINE 2.5-2.5 % EX CREA
1.0000 "application " | TOPICAL_CREAM | CUTANEOUS | Status: DC | PRN
  Filled 2012-03-26: qty 5

## 2012-03-26 NOTE — Progress Notes (Signed)
Report called to [facility] RN and met her at bedside. Patient transferred to [id number] without complications.  Patient belongings including laptop, charge cord, wallet, and phone travelled with him.

## 2012-03-26 NOTE — Progress Notes (Signed)
Pt profile: [name] with ESRD, chronic pain and h/o DVT who presented to the ED 10/27 with RLQ pain, hypotension, anemia due to Large subcapsular and retroperitoneal hematoma involving the right kidney and pararenal tissues (based on CT abd). Received 4 units PRBCs and FFP.  Admitted by PCCM to ICU. BP much improved AM 10/28 and transferred to Renal floor.   Active problems: Retroperitoneal hematoma ESRD H/O DVT H/O IVC filter placement DM2  Lines, Tubes, etc: R radial A-line 10/27 >> 10/28 R IJ CVL 10/28 >>   Microbiology: MRSA PCR 10/27 >> NEG Blood 10/27 >>   Antibiotics:  none  Studies/Events: CT abdomen/pelvis 10/27: Large subcapsular and retroperitoneal hematoma involving the right kidney and pararenal tissues.    Consults:  [facility] (wilson) 10/27: Rec Urology consult. Signed off 10/28 Urology 10/27: recommends repeat CT abdomen in 6 weeks to evaluate the right kidney for a mass and hematoma resolution Renal 10/27: managing HD   Best Practice: DVT: SCDs SUP: PPI Nutrition: renal diet Glycemic control: N/I Sedation/analgesia: PRN morphine    Subj: No new complaints. No distress   Obj: Filed Vitals:   03/26/12 1336  BP: 132/11  Pulse: 93  Temp: 97.9 F (36.6 C)  Resp: 20    Gen: chronically ill appearing, no distress HEENT: WNL Neck: JVP cannot be assessed due to habitus Chest: Clear anteriorly, no wheeze Cardiac: RRR s M Abd: Obese, soft, NT, NABS Ext: warm, trace bilateral ankle edema  BMET    Component Value Date/Time   NA 140 03/26/2012 0730   K 3.9 03/26/2012 0730   CL 101 03/26/2012 0730   CO2 26 03/26/2012 0730   GLUCOSE 131* 03/26/2012 0730   BUN 39* 03/26/2012 0730   CREATININE 3.96* 03/26/2012 0730   CALCIUM 8.9 03/26/2012 0730   GFRNONAA 17* 03/26/2012 0730   GFRAA 20* 03/26/2012 0730    CBC    Component Value Date/Time   WBC 24.2* 03/26/2012 0737   RBC 2.43* 03/26/2012 0737   HGB 7.9* 03/26/2012 0737   HCT 23.3* 03/26/2012  0737   PLT 153 03/26/2012 0737   MCV 95.9 03/26/2012 0737   MCH 32.5 03/26/2012 0737   MCHC 33.9 03/26/2012 0737   RDW 19.4* 03/26/2012 0737   LYMPHSABS 1.5 08/24/2011 1030   MONOABS 0.7 08/24/2011 1030   EOSABS 0.0 08/24/2011 1030   BASOSABS 0.0 08/24/2011 1030    CXR: no new film   IMPRESSION/PLAN: 1) Large subcapsular and retroperitoneal hematoma involving the right kidney and pararenal tissues - likely due to excessive warfarin and uremic platelet dysfunction.  - Off warfarin. Received FFP  - Monitor CBC and coags  - Consider Vitamin K if evidence of continued bleeding or PT remains markedly prolonged  - Repeat Ct abdomen in 4-6 wks per Urology  2) H/O DVT, s/p IVC filter  - Probably too high risk to resume warfarin at any time, certainly no warfarin for 6-8 weeks  3) ESRD - HD per Renal service 4) DM2 - not requiring SSI  Cont to monitor 5) Transfer to Renal floor 10/28   Billy Fischer, MD ; [facility] service Mobile [phone].  After 5:30 PM or weekends, call [phone]

## 2012-03-26 NOTE — Progress Notes (Signed)
CVC palcement questionable in the brachiocephalic vein vs arterial system. We will not use for now; draw a VBG from peripheral blood and central catheter. If in the venous system ok to use.

## 2012-03-26 NOTE — Progress Notes (Signed)
Subjective: Patient is A/A/O in no acute distress this morning he offers no new c/o.   Objective: Vital signs in last 24 hours: Temp:  [96.5 F (35.8 C)-98.4 F (36.9 C)] 98.2 F (36.8 C) (10/28 0400) Pulse Rate:  [103-118] 109  (10/28 0700) Resp:  [14-27] 22  (10/28 0700) BP: (69-116)/(38-69) 110/53 mmHg (10/28 0700) SpO2:  [94 %-100 %] 95 % (10/28 0700) Arterial Line BP: (69-110)/(41-63) 99/51 mmHg (10/28 0700) Weight:  [327 lb 13.2 oz (148.7 kg)-329 lb 5.9 oz (149.4 kg)] 327 lb 13.2 oz (148.7 kg) (10/28 0251) Last BM Date: 03/24/12  Intake/Output from previous day: 10/27 0701 - 10/28 0700 In: 2163.6 [I.V.:947.6; Blood:700; IV Piggyback:516] Out: 0  Intake/Output this shift:    General appearance: alert, cooperative, appears stated age, no distress and morbidly obese Chest: CTA Cardiac: sinus tach rate 109 Abdomen: morbidly obese, positive ventral hernia, non tender, + BS, no distention. WBC elevated from previous check (unknown etiology) BUN and Creat elevated (hx of ESRD on HD) Nephrology and Urology following. VSS, febrile  Lab Results:   Basename 03/26/12 0230 03/25/12 1930  WBC 43.0* 30.5*  HGB 10.0* 7.3*  HCT 29.0* 21.3*  PLT 261 209   BMET  Basename 03/25/12 0111 03/25/12 0054  NA 135 134*  K 5.1 5.1  CL 101 92*  CO2 -- 21  GLUCOSE 157* 163*  BUN 83* 81*  CREATININE 6.30* 6.85*  CALCIUM -- 9.7   PT/INR  Basename 03/25/12 0054  LABPROT 23.2*  INR 2.16*   ABG  Basename 03/25/12 0141  PHART --  HCO3 22.4    Studies/Results: Ct Angio Pelvis W/cm &/or Wo/cm  03/25/2012  *RADIOLOGY REPORT*  Clinical Data:  Nausea, vomiting, and diarrhea.  Hypotension.  CT ANGIOGRAPHY ABDOMEN AND PELVIS  Technique:  Multidetector CT imaging of the abdomen and pelvis was performed using the standard protocol during bolus administration of intravenous contrast.  Multiplanar reconstructed images including MIPs were obtained and reviewed to evaluate the vascular  anatomy.  Contrast: OMNIPAQUE IOHEXOL 350 MG/ML SOLN  Comparison:  11/18/2010  Findings:  There is a large subcapsular hematoma involving the right kidney with compression of the renal parenchyma.  There is hemorrhage and hematoma in the right pararenal fascia and fat and extending into the adjacent soft tissues, extending down along the pericolic gutter into the right pelvis.  The differential diagnosis includes a ruptured renal cell carcinoma versus laceration or renal arterial injury.  No renal hematoma measures about 13.5 x 11.4 cm transverse dimension.  NG tube in the stomach.  Apparent postoperative changes in the stomach.  The liver, spleen, contracted gallbladder, pancreas, left kidney, abdominal aorta, and retroperitoneal lymph nodes are unremarkable.  IVC filter.  The stomach, small bowel, and colon are not abnormally distended.  There is a small anterior abdominal wall hernia containing transverse colon without bowel distension.  No free air in the abdomen.  Pelvis:  Right pelvic hematoma extending down from the right kidney as previously described.  This appears to be extraperitoneal.  The bladder is decompressed.  The prostate gland is not enlarged.  No significant pelvic lymphadenopathy.  Degenerative changes in the spine.  Postoperative changes in the left hip.   Review of the MIP images confirms the above findings.  IMPRESSION: Large subcapsular and retroperitoneal hematoma involving the right kidney and pararenal tissues.  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  Findings were discussed with Dr.  Manly at 0536 hours on 03/25/2012.   Original Report Authenticated By: Marlon Pel, M.D.    Ct Angio Abdomen W/cm &/or Wo Contrast  03/25/2012  *RADIOLOGY REPORT*  Clinical Data:  Nausea, vomiting, and diarrhea.  Hypotension.  CT ANGIOGRAPHY ABDOMEN AND PELVIS  Technique:  Multidetector CT imaging of  the abdomen and pelvis was performed using the standard protocol during bolus administration of intravenous contrast.  Multiplanar reconstructed images including MIPs were obtained and reviewed to evaluate the vascular anatomy.  Contrast: OMNIPAQUE IOHEXOL 350 MG/ML SOLN  Comparison:  11/18/2010  Findings:  There is a large subcapsular hematoma involving the right kidney with compression of the renal parenchyma.  There is hemorrhage and hematoma in the right pararenal fascia and fat and extending into the adjacent soft tissues, extending down along the pericolic gutter into the right pelvis.  The differential diagnosis includes a ruptured renal cell carcinoma versus laceration or renal arterial injury.  No renal hematoma measures about 13.5 x 11.4 cm transverse dimension.  NG tube in the stomach.  Apparent postoperative changes in the stomach.  The liver, spleen, contracted gallbladder, pancreas, left kidney, abdominal aorta, and retroperitoneal lymph nodes are unremarkable.  IVC filter.  The stomach, small bowel, and colon are not abnormally distended.  There is a small anterior abdominal wall hernia containing transverse colon without bowel distension.  No free air in the abdomen.  Pelvis:  Right pelvic hematoma extending down from the right kidney as previously described.  This appears to be extraperitoneal.  The bladder is decompressed.  The prostate gland is not enlarged.  No significant pelvic lymphadenopathy.  Degenerative changes in the spine.  Postoperative changes in the left hip.   Review of the MIP images confirms the above findings.  IMPRESSION: Large subcapsular and retroperitoneal hematoma involving the right kidney and pararenal tissues.  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  Findings were discussed with Dr. Lavella Lemons at 0536 hours on 03/25/2012.   Original Report Authenticated By: Marlon Pel,  M.D.    Dg Chest Portable 1 View  03/25/2012  *RADIOLOGY REPORT*  Clinical Data: Central line placement.  PORTABLE CHEST - 1 VIEW  Comparison: 03/25/2012 at 0108 hours.  Findings: Interval placement of a right central venous catheter. The tip is projected over the right upper mediastinum and is directed somewhat towards the left.  This suggest possible location at the origin of the brachial cephalic vein.  Arterial placement is not excluded.  No pneumothorax.  No developing pleural fluid collections.  Normal heart size and pulmonary vascularity.  No focal airspace consolidation.  IMPRESSION: Right central venous catheter is in the right upper mediastinum directed towards the left, possibly representing location in the origin of the brachial cephalic vein although arterial placement is not excluded.   Original Report Authenticated By: Marlon Pel, M.D.    Dg Chest Portable 1 View  03/25/2012  *RADIOLOGY REPORT*  Clinical Data: Abdominal pain, nausea/vomiting/diarrhea  PORTABLE CHEST - 1 VIEW  Comparison: 08/24/2011  Findings: Cardiomegaly.  No frank interstitial edema.  Stable right infrahilar prominence, likely vascular.  No pleural effusion or pneumothorax.  IMPRESSION: Cardiomegaly.  No frank interstitial edema.   Original Report Authenticated By: Charline Bills, M.D.     Anti-infectives: Anti-infectives     Start     Dose/Rate Route Frequency Ordered Stop   03/25/12 0300   ertapenem (INVANZ) 1 g in sodium chloride 0.9 %  50 mL IVPB        1 g 100 mL/hr over 30 Minutes Intravenous  Once 03/25/12 0221 03/25/12 0346          Assessment/Plan: Patient Active Problem List  Diagnosis  . ESRD (end stage renal disease) on dialysis  . Intertrochanteric fracture  . Hypertension  . Morbid obesity  . GERD (gastroesophageal reflux disease)  . OSA (obstructive sleep apnea)  . DVT (deep venous thrombosis)  . Hip fracture  s/p * No surgery found * Large right kidney subcapsular  hematoma Right retroperitoneal hematoma  No surgical intervention indicated at this time He is being f/u by both Nephrology and Urology We will sign off at this juncture.   LOS: 1 day    [name] 03/26/2012

## 2012-03-26 NOTE — Progress Notes (Signed)
Patient ID: [name], male   DOB: [birth year], 46 y.o.   MRN: [id number]  Pt looks and feels better today. Per wife, he had bloody BM's prior to presentation as well.   NAD Abd - soft, NT, ND  Imp/Plan -  -improving clinically, would not rescan unless he shows signs of acute blood loss.  -f/u in 6 weeks with me to schedule further imaging. I discussed with patient and wife.

## 2012-03-26 NOTE — Progress Notes (Signed)
Subjective: Interval History: has complaints N&V.  Objective: Vital signs in last 24 hours: Temp:  [96.5 F (35.8 C)-98.6 F (37 C)] 98.6 F (37 C) (10/28 0800) Pulse Rate:  [103-118] 107  (10/28 0800) Resp:  [16-27] 17  (10/28 0900) BP: (69-116)/(38-69) 111/56 mmHg (10/28 0900) SpO2:  [93 %-100 %] 93 % (10/28 0800) Arterial Line BP: (69-110)/(42-63) 103/53 mmHg (10/28 0900) Weight:  [148.7 kg (327 lb 13.2 oz)-149.4 kg (329 lb 5.9 oz)] 148.7 kg (327 lb 13.2 oz) (10/28 0251) Weight change:   Intake/Output from previous day: 10/27 0701 - 10/28 0700 In: 2163.6 [I.V.:947.6; Blood:700; IV Piggyback:516] Out: 0  Intake/Output this shift: Total I/O In: 40 [I.V.:40] Out: -   General appearance: alert, appears older than stated age and pale Resp: diminished breath sounds bilaterally and rales bibasilar Cardio: S1, S2 normal and systolic murmur: holosystolic 2/6, blowing at apex GI: obese, pos bs, tender RUQ and fullness Extremities: edema 1+ and AVF LFA  Lab Results:  Basename 03/26/12 0737 03/26/12 0230  WBC 24.2* 43.0*  HGB 7.9* 10.0*  HCT 23.3* 29.0*  PLT 153 261   BMET:  Basename 03/26/12 0730 03/25/12 0111 03/25/12 0054  NA 140 135 --  K 3.9 5.1 --  CL 101 101 --  CO2 26 -- 21  GLUCOSE 131* 157* --  BUN 39* 83* --  CREATININE 3.96* 6.30* --  CALCIUM 8.9 -- 9.7   No results found for this basename: PTH:2 in the last 72 hours Iron Studies: No results found for this basename: IRON,TIBC,TRANSFERRIN,FERRITIN in the last 72 hours  Studies/Results: Ct Angio Pelvis W/cm &/or Wo/cm  03/25/2012  *RADIOLOGY REPORT*  Clinical Data:  Nausea, vomiting, and diarrhea.  Hypotension.  CT ANGIOGRAPHY ABDOMEN AND PELVIS  Technique:  Multidetector CT imaging of the abdomen and pelvis was performed using the standard protocol during bolus administration of intravenous contrast.  Multiplanar reconstructed images including MIPs were obtained and reviewed to evaluate the vascular anatomy.   Contrast: OMNIPAQUE IOHEXOL 350 MG/ML SOLN  Comparison:  11/18/2010  Findings:  There is a large subcapsular hematoma involving the right kidney with compression of the renal parenchyma.  There is hemorrhage and hematoma in the right pararenal fascia and fat and extending into the adjacent soft tissues, extending down along the pericolic gutter into the right pelvis.  The differential diagnosis includes a ruptured renal cell carcinoma versus laceration or renal arterial injury.  No renal hematoma measures about 13.5 x 11.4 cm transverse dimension.  NG tube in the stomach.  Apparent postoperative changes in the stomach.  The liver, spleen, contracted gallbladder, pancreas, left kidney, abdominal aorta, and retroperitoneal lymph nodes are unremarkable.  IVC filter.  The stomach, small bowel, and colon are not abnormally distended.  There is a small anterior abdominal wall hernia containing transverse colon without bowel distension.  No free air in the abdomen.  Pelvis:  Right pelvic hematoma extending down from the right kidney as previously described.  This appears to be extraperitoneal.  The bladder is decompressed.  The prostate gland is not enlarged.  No significant pelvic lymphadenopathy.  Degenerative changes in the spine.  Postoperative changes in the left hip.   Review of the MIP images confirms the above findings.  IMPRESSION: Large subcapsular and retroperitoneal hematoma involving the right kidney and pararenal tissues.  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  Findings were discussed with Dr. Lavella Lemons at [phone] hours on  03/25/2012.   Original Report Authenticated By: Marlon Pel, M.D.    Ct Angio Abdomen W/cm &/or Wo Contrast  03/25/2012  *RADIOLOGY REPORT*  Clinical Data:  Nausea, vomiting, and diarrhea.  Hypotension.  CT ANGIOGRAPHY ABDOMEN AND PELVIS  Technique:  Multidetector CT imaging of the  abdomen and pelvis was performed using the standard protocol during bolus administration of intravenous contrast.  Multiplanar reconstructed images including MIPs were obtained and reviewed to evaluate the vascular anatomy.  Contrast: OMNIPAQUE IOHEXOL 350 MG/ML SOLN  Comparison:  11/18/2010  Findings:  There is a large subcapsular hematoma involving the right kidney with compression of the renal parenchyma.  There is hemorrhage and hematoma in the right pararenal fascia and fat and extending into the adjacent soft tissues, extending down along the pericolic gutter into the right pelvis.  The differential diagnosis includes a ruptured renal cell carcinoma versus laceration or renal arterial injury.  No renal hematoma measures about 13.5 x 11.4 cm transverse dimension.  NG tube in the stomach.  Apparent postoperative changes in the stomach.  The liver, spleen, contracted gallbladder, pancreas, left kidney, abdominal aorta, and retroperitoneal lymph nodes are unremarkable.  IVC filter.  The stomach, small bowel, and colon are not abnormally distended.  There is a small anterior abdominal wall hernia containing transverse colon without bowel distension.  No free air in the abdomen.  Pelvis:  Right pelvic hematoma extending down from the right kidney as previously described.  This appears to be extraperitoneal.  The bladder is decompressed.  The prostate gland is not enlarged.  No significant pelvic lymphadenopathy.  Degenerative changes in the spine.  Postoperative changes in the left hip.   Review of the MIP images confirms the above findings.  IMPRESSION: Large subcapsular and retroperitoneal hematoma involving the right kidney and pararenal tissues.  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  Findings were discussed with Dr. Lavella Lemons at 0536 hours on 03/25/2012.   Original Report Authenticated By: Marlon Pel, M.D.      Dg Chest Portable 1 View  03/25/2012  *RADIOLOGY REPORT*  Clinical Data: Central line placement.  PORTABLE CHEST - 1 VIEW  Comparison: 03/25/2012 at 0108 hours.  Findings: Interval placement of a right central venous catheter. The tip is projected over the right upper mediastinum and is directed somewhat towards the left.  This suggest possible location at the origin of the brachial cephalic vein.  Arterial placement is not excluded.  No pneumothorax.  No developing pleural fluid collections.  Normal heart size and pulmonary vascularity.  No focal airspace consolidation.  IMPRESSION: Right central venous catheter is in the right upper mediastinum directed towards the left, possibly representing location in the origin of the brachial cephalic vein although arterial placement is not excluded.   Original Report Authenticated By: Marlon Pel, M.D.    Dg Chest Portable 1 View  03/25/2012  *RADIOLOGY REPORT*  Clinical Data: Abdominal pain, nausea/vomiting/diarrhea  PORTABLE CHEST - 1 VIEW  Comparison: 08/24/2011  Findings: Cardiomegaly.  No frank interstitial edema.  Stable right infrahilar prominence, likely vascular.  No pleural effusion or pneumothorax.  IMPRESSION: Cardiomegaly.  No frank interstitial edema.   Original Report Authenticated By: Charline Bills, M.D.     I have reviewed the patient's current medications.  Assessment/Plan: 1 CRF had hd this am, will schedule in am tues.  No hep. Add MVI 2 Anemia add epo check Fe, 3 HPTH 4  Massive obesity mobiliz 5 Renal bleed etio 6 DM fair control  P diet, epo, check fe, mvi , hd    LOS: 1 day   Sarie Stall L 03/26/2012,9:22 AM

## 2012-03-26 NOTE — Progress Notes (Signed)
CRITICAL VALUE ALERT  Critical value received:  hgb 6.7  Date of notification:  03/26/12  Time of notification:  19:36  Critical value read back: yes  Nurse who received alert:  Sharen Heck  MD notified (1st page):  Dr. Delford Field  Time of first page:  19:45  MD notified (2nd page):  Time of second page:  Responding MD:  Dr. Delford Field  Time MD responded:  19:46

## 2012-03-26 NOTE — Progress Notes (Signed)
03/26/2012 patient transfer from 2300 to 6700 at 1340. He is alert, oriented and non ambulatory. Patient skin is fine, but have some dryness mostly on feet. Sacrum is pink, but no broken area. Patient was place on contact when arrive on floor. 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 He was place on telemetry. Patient does have a right  central venous catheter that was placed 03/25/2012. Biochemist, clinical.

## 2012-03-27 ENCOUNTER — Inpatient Hospital Stay (HOSPITAL_COMMUNITY): Payer: BC Managed Care – PPO

## 2012-03-27 DIAGNOSIS — D649 Anemia, unspecified: Secondary | ICD-10-CM

## 2012-03-27 LAB — PREPARE FRESH FROZEN PLASMA
Unit division: 0
Unit division: 0

## 2012-03-27 LAB — COMPREHENSIVE METABOLIC PANEL
AST: 20 U/L (ref 0–37)
BUN: 50 mg/dL — ABNORMAL HIGH (ref 6–23)
CO2: 27 mEq/L (ref 19–32)
Calcium: 8.4 mg/dL (ref 8.4–10.5)
Creatinine, Ser: 5.13 mg/dL — ABNORMAL HIGH (ref 0.50–1.35)
GFR calc Af Amer: 15 mL/min — ABNORMAL LOW (ref >90)
GFR calc non Af Amer: 13 mL/min — ABNORMAL LOW (ref >90)

## 2012-03-27 LAB — CBC
HCT: 19.8 % — ABNORMAL LOW (ref 39.0–52.0)
MCH: 33 pg (ref 26.0–34.0)
MCV: 99 fL (ref 78.0–100.0)
Platelets: 123 10*3/uL — ABNORMAL LOW (ref 150–400)
RBC: 2 MIL/uL — ABNORMAL LOW (ref 4.22–5.81)

## 2012-03-27 LAB — PROTIME-INR
INR: 1.19 (ref 0.00–1.49)
Prothrombin Time: 14.9 seconds (ref 11.6–15.2)

## 2012-03-27 LAB — PREPARE RBC (CROSSMATCH)

## 2012-03-27 LAB — PHOSPHORUS: Phosphorus: 4.8 mg/dL — ABNORMAL HIGH (ref 2.3–4.6)

## 2012-03-27 MED ORDER — DARBEPOETIN ALFA-POLYSORBATE 200 MCG/0.4ML IJ SOLN
INTRAMUSCULAR | Status: AC
  Administered 2012-03-27: 200 ug via INTRAVENOUS
  Filled 2012-03-27: qty 0.4

## 2012-03-27 MED ORDER — MORPHINE SULFATE 2 MG/ML IJ SOLN
INTRAMUSCULAR | Status: AC
  Administered 2012-03-27: 2 mg via INTRAVENOUS
  Filled 2012-03-27: qty 1

## 2012-03-27 MED ORDER — SODIUM CHLORIDE 0.9 % IV SOLN
125.0000 mg | INTRAVENOUS | Status: DC
  Administered 2012-03-27 – 2012-04-05 (×5): 125 mg via INTRAVENOUS
  Filled 2012-03-27 (×9): qty 10

## 2012-03-27 MED ORDER — RENA-VITE PO TABS
1.0000 | ORAL_TABLET | Freq: Every day | ORAL | Status: DC
  Administered 2012-03-28 – 2012-04-05 (×9): 1 via ORAL
  Filled 2012-03-27 (×10): qty 1

## 2012-03-27 NOTE — Progress Notes (Signed)
Pt profile: [name] with ESRD, chronic pain and h/o DVT who presented to the ED 10/27 with RLQ pain, hypotension, anemia due to Large subcapsular and retroperitoneal hematoma involving the right kidney and pararenal tissues (based on CT abd). Received 4 units PRBCs and FFP.  Admitted by PCCM to ICU. BP much improved AM 10/28 and transferred to Renal floor.   Active problems: Retroperitoneal hematoma ESRD H/O DVT H/O IVC filter placement DM2  Lines, Tubes, etc: R radial A-line 10/27 >> 10/28 R IJ CVL 10/28 >>   Microbiology: MRSA PCR 10/27 >> NEG Blood 10/27 >>   Antibiotics:  none  Studies/Events: CT abdomen/pelvis 10/27: Large subcapsular and retroperitoneal hematoma involving the right kidney and pararenal tissues.    Consults:  CCS (wilson) 10/27: Rec Urology consult. Signed off 10/28 Urology 10/27: recommends repeat CT abdomen in 6 weeks to evaluate the right kidney for a mass and hematoma resolution Renal 10/27: managing HD   Best Practice: DVT: SCDs SUP: PPI Nutrition: renal diet Glycemic control: N/I Sedation/analgesia: PRN morphine    Subj: No new complaints. No distress. This morning hgb < 7gm% again. Currently in HD suite    Obj: Filed Vitals:   03/27/12 0830  BP: 94/42  Pulse: 88  Temp:   Resp: 17    Gen: chronically ill appearing, no distress HEENT: WNL Neck: JVP cannot be assessed due to habitus Chest: Clear anteriorly, no wheeze Cardiac: RRR s M Abd: Obese, soft, NT, NABS Ext: warm, trace bilateral ankle edema  BMET    Component Value Date/Time   NA 137 03/27/2012 0704   K 3.9 03/27/2012 0704   CL 98 03/27/2012 0704   CO2 27 03/27/2012 0704   GLUCOSE 123* 03/27/2012 0704   BUN 50* 03/27/2012 0704   CREATININE 5.13* 03/27/2012 0704   CALCIUM 8.4 03/27/2012 0704   GFRNONAA 13* 03/27/2012 0704   GFRAA 15* 03/27/2012 0704    CBC    Component Value Date/Time   WBC 13.4* 03/27/2012 0704   RBC 2.00* 03/27/2012 0704   HGB 6.6*  03/27/2012 0704   HCT 19.8* 03/27/2012 0704   PLT 123* 03/27/2012 0704   MCV 99.0 03/27/2012 0704   MCH 33.0 03/27/2012 0704   MCHC 33.3 03/27/2012 0704   RDW 18.7* 03/27/2012 0704   LYMPHSABS 1.5 08/24/2011 1030   MONOABS 0.7 08/24/2011 1030   EOSABS 0.0 08/24/2011 1030   BASOSABS 0.0 08/24/2011 1030    Lab 03/27/12 0704 03/26/12 1900 03/26/12 0737  HGB 6.6* 6.7* 7.9*  HCT 19.8* 20.3* 23.3*  WBC 13.4* 16.0* 24.2*  PLT 123* 136* 153     CXR: no new film   IMPRESSION/PLAN: 1) Large subcapsular and retroperitoneal hematoma involving the right kidney and pararenal tissues - likely due to excessive warfarin and uremic platelet dysfunction. S/p Off warfarin. Received FFP   0n 03/27/12: HGb <7gm%. No obvious bleed on bp/hr dysfunction  PLAN   - give another 1 unit prbc 9am 03/27/12  - Monitor CBC and coags; repeat at 4pm 03/27/12  - Consider Vitamin K if evidence of continued bleeding or PT remains markedly prolonged  - Repeat Ct abdomen in 4-6 wks per Urology  2) H/O DVT, s/p IVC filter  - Probably too high risk to resume warfarin at any time, certainly no warfarin for 6-8 weeks  3) ESRD - HD per Renal service 4) DM2 - not requiring SSI  Cont to monitor 5) Transferred to Renal floor 10/28. Keep on PCCM through 03/27/12. Will  ask triad to assume 03/28/12    Dr. Kalman Shan, M.D., [facility].C.P Pulmonary and Critical Care Medicine Staff Physician [facility] Pager: [phone], If no answer or between  15:00h - 7:00h: call [phone]  03/27/2012 8:56 AM

## 2012-03-27 NOTE — Procedures (Signed)
I was present at this session.  I have reviewed the session itself and made appropriate changes.  Refuses mobilization  [name] L 10/29/20139:41 AM

## 2012-03-27 NOTE — Progress Notes (Signed)
Subjective: Interval History: has complaints weak.  Objective: Vital signs in last 24 hours: Temp:  [97.6 F (36.4 C)-98.3 F (36.8 C)] 97.8 F (36.6 C) (10/29 0945) Pulse Rate:  [66-102] 100  (10/29 0945) Resp:  [15-22] 15  (10/29 0945) BP: (85-132)/(11-54) 95/49 mmHg (10/29 0945) SpO2:  [92 %-96 %] 92 % (10/29 0706) Weight:  [154.4 kg (340 lb 6.2 oz)] 154.4 kg (340 lb 6.2 oz) (10/29 0650) Weight change: 5 kg (11 lb 0.4 oz)  Intake/Output from previous day: 10/28 0701 - 10/29 0700 In: 282 [P.O.:240; I.V.:40; IV Piggyback:2] Out: 0  Intake/Output this shift:    General appearance: alert, morbidly obese, pale and slowed mentation Resp: diminished breath sounds bilaterally Cardio: S1, S2 normal and decreased HS GI: obese,pos bs, liver down 6 cm Extremities: edema 1+  Lab Results:  Basename 03/27/12 0704 03/26/12 1900  WBC 13.4* 16.0*  HGB 6.6* 6.7*  HCT 19.8* 20.3*  PLT 123* 136*   BMET:  Basename 03/27/12 0704 03/26/12 0730  NA 137 140  K 3.9 3.9  CL 98 101  CO2 27 26  GLUCOSE 123* 131*  BUN 50* 39*  CREATININE 5.13* 3.96*  CALCIUM 8.4 8.9   No results found for this basename: PTH:2 in the last 72 hours Iron Studies:  Basename 03/26/12 1000  IRON 31*  TIBC 189*  TRANSFERRIN --  FERRITIN --    Studies/Results: No results found.  I have reviewed the patient's current medications.  Assessment/Plan: 1 CRF on HD, solute, acid base, K ok. 2 Renal bleed Hb fell, given 1 U PRBC (5total) 3 anemia Fe low, given epo, add fe 4 Massive obesity 5 Lack of motivation ?? Depression 6 Hpth meds P HD, Transfuse, mobilize, ? antidepressant    LOS: 2 days   Kalan Yeley L 03/27/2012,9:54 AM

## 2012-03-27 NOTE — Progress Notes (Signed)
Unable to draw blood gas , venous from distal (brown) port per MD orders,RN aware.

## 2012-03-27 NOTE — Procedures (Signed)
Pt refused to transfer to recliner for hemodialysis treatment. "I'm just too sore."  Reports he has not been OOB, but has sat on the edge of the bed with help. "I can't sit in that recliner for 5 hours."  Discussed complications of immobility and potential increased LOS, but pt still refuses.  Will transport to HD in bed and follow up with nephrologist.

## 2012-03-27 NOTE — Progress Notes (Signed)
INITIAL ADULT NUTRITION ASSESSMENT Date: 03/27/2012   Time: 10:20 AM  Reason for Assessment: Malnutrition Screening  INTERVENTION: 1. Declined all snacks/supplements. Discussed need for protein-rich foods. 2. Encourage intake as able, RD to continue to follow.  DOCUMENTATION CODES Per approved criteria  -Morbid Obesity   ASSESSMENT: Male 46 y.o.  Dx: Anemia  Hx:  Past Medical History  Diagnosis Date  . Hypertension   . GERD (gastroesophageal reflux disease)   . DVT (deep venous thrombosis)   . Renal disorder   . Complication of anesthesia     " I am hard  to wake up "  . Diabetes mellitus   . ESRD on hemodialysis     Dialysis at [facility] in [place], [place]. Started dialysis in 2010. ESRD due to DM/HTN.   Past Surgical History  Procedure Date  . Gastric sleeve   . Kidney stones   . Dialysis port   . Compression hip screw 08/26/2011    Procedure: COMPRESSION HIP;  Surgeon: Velna Ochs, MD;  Location: [facility] OR;  Service: Orthopedics;  Laterality: Left;  DYNAMIC HIP SCREW COMPRESSION  . Ivc filter   . Cholecystostomy tube    Related Meds:     . darbepoetin (ARANESP) injection - DIALYSIS  200 mcg Intravenous Q Tue-HD  . escitalopram  20 mg Oral Daily  . ferric gluconate (FERRLECIT/NULECIT) IV  125 mg Intravenous Q T,Th,Sa-HD  . multivitamin  1 tablet Oral Daily  . OxyCODONE  60 mg Oral Q12H  . pantoprazole  40 mg Oral Q1200  . sodium chloride  500 mL Intravenous Once   Ht: 6' (182.9 cm)  Wt: 327 lb/149 kg s/p HD 10/28  Ideal Wt: 178 lb/80.9 kg % Ideal Wt: 184%  Wt Readings from Last 15 Encounters:  03/27/12 340 lb 6.2 oz (154.4 kg)  09/10/11 358 lb 7.5 oz (162.6 kg)  09/01/11 334 lb 10.5 oz (151.8 kg)  09/01/11 334 lb 10.5 oz (151.8 kg)  Usual Wt: 335 lb % Usual Wt: 98%  Body mass index is 46.17 kg/(m^2). Obese Class III, Extreme  Food/Nutrition Related Hx: pt with hx of gastric sleeve approx 3 years ago; has not eaten much since Saturday  2/2 increased n/v  Labs:  CMP     Component Value Date/Time   NA 137 03/27/2012 0704   K 3.9 03/27/2012 0704   CL 98 03/27/2012 0704   CO2 27 03/27/2012 0704   GLUCOSE 123* 03/27/2012 0704   BUN 50* 03/27/2012 0704   CREATININE 5.13* 03/27/2012 0704   CALCIUM 8.4 03/27/2012 0704   PROT 5.5* 03/27/2012 0704   ALBUMIN 2.1* 03/27/2012 0704   AST 20 03/27/2012 0704   ALT 54* 03/27/2012 0704   ALKPHOS 86 03/27/2012 0704   BILITOT 0.4 03/27/2012 0704   GFRNONAA 13* 03/27/2012 0704   GFRAA 15* 03/27/2012 0704   CBG (last 3)  No results found for this basename: GLUCAP:3 in the last 72 hours  Lab Results  Component Value Date   HGBA1C 5.3 08/25/2011   Phosphorus  Date/Time Value Range Status  03/27/2012  7:04 AM 4.8* 2.3 - 4.6 mg/dL Final   Magnesium  Date/Time Value Range Status  03/25/2012  1:33 AM 2.0  1.5 - 2.5 mg/dL Final    Intake/Output Summary (Last 24 hours) at 03/27/12 1023 Last data filed at 03/27/12 1000  Gross per 24 hour  Intake    492 ml  Output      0 ml  Net  492 ml   Diet Order: Renal 80-90  Supplements/Tube Feeding: Rena-Vit  IVF:    Estimated Nutritional Needs:   Kcal: 2200 - 2500 kcal Protein: 120 - 140 grams Fluid:  1.2 liters daily  Pt with n/v since Saturday per his report. States that all he has eaten since Saturday has been a [place] sandwich that he ate last night. Pt has been trying to lose weight since his gastric sleeve surgery approximately 3 years ago. He states that he weighed close to 800 lb at that time. Says he is continuing to try to lose weight, aims for 2 lb a week. Sees an RD at his HD center but not one for his weight loss. States that he has been told to take whey protein but doesn't like the taste. Pt refusing all supplements offered at this time as he is afraid it will make his emesis and nausea worsen. States, "I will get through this in 2 days and be able to eat again."  No skin breakdown noted. Pt is at nutrition risk  given Extreme Obesity, poor oral intake, and medical comorbidities.  NUTRITION DIAGNOSIS: Inadequate oral intake r/t GI distress and poor appetite AEB pt report and dietary recall.  MONITORING/EVALUATION(Goals): Goal: Pt to meet >/= 90% of their estimated nutrition needs Monitor: weight trends, lab trends, I/O's, PO intake  EDUCATION NEEDS: -Education needs addressed  Jarold Motto MS, RD, LDN Pager: [phone] After-hours pager: [phone]

## 2012-03-27 NOTE — Progress Notes (Signed)
Some confusion if RT IJ is in artery because of a "venous" samole showing PO2 150 on 03/26/12 at 20.30  In addition CXR report raises same question on 03/25/12 at 2:16am   RADIOLOGY REPORT*  Clinical Data: Central line placement.  PORTABLE CHEST - 1 VIEW  Comparison: 03/25/2012 at 0108 hours.  Findings: Interval placement of a right central venous catheter.  The tip is projected over the right upper mediastinum and is  directed somewhat towards the left. This suggest possible location  at the origin of the brachial cephalic vein. Arterial placement is  not excluded. No pneumothorax. No developing pleural fluid  collections. Normal heart size and pulmonary vascularity. No  focal airspace consolidation.  IMPRESSION:  Right central venous catheter is in the right upper mediastinum  directed towards the left, possibly representing location in the  origin of the brachial cephalic vein although arterial placement is  not excluded.  Original Report Authenticated By: Marlon Pel, M.D.   PLAN  - will get "vbg" from [facility] and decide

## 2012-03-28 ENCOUNTER — Inpatient Hospital Stay (HOSPITAL_COMMUNITY): Payer: BC Managed Care – PPO

## 2012-03-28 DIAGNOSIS — I2699 Other pulmonary embolism without acute cor pulmonale: Secondary | ICD-10-CM

## 2012-03-28 LAB — CBC
HCT: 21.5 % — ABNORMAL LOW (ref 39.0–52.0)
HCT: 23.6 % — ABNORMAL LOW (ref 39.0–52.0)
Hemoglobin: 6.8 g/dL — CL (ref 13.0–17.0)
Hemoglobin: 7.6 g/dL — ABNORMAL LOW (ref 13.0–17.0)
MCH: 31.4 pg (ref 26.0–34.0)
MCH: 31.9 pg (ref 26.0–34.0)
MCH: 32.2 pg (ref 26.0–34.0)
MCHC: 32.2 g/dL (ref 30.0–36.0)
MCHC: 32.5 g/dL (ref 30.0–36.0)
MCV: 99.1 fL (ref 78.0–100.0)
MCV: 99.5 fL (ref 78.0–100.0)
Platelets: 120 10*3/uL — ABNORMAL LOW (ref 150–400)
Platelets: 123 10*3/uL — ABNORMAL LOW (ref 150–400)
RBC: 2.11 MIL/uL — ABNORMAL LOW (ref 4.22–5.81)
RDW: 19.2 % — ABNORMAL HIGH (ref 11.5–15.5)
RDW: 20.6 % — ABNORMAL HIGH (ref 11.5–15.5)

## 2012-03-28 LAB — BLOOD GAS, VENOUS
Acid-Base Excess: 4.9 mmol/L — ABNORMAL HIGH (ref 0.0–2.0)
O2 Saturation: 67.9 %
Patient temperature: 98.6

## 2012-03-28 LAB — RENAL FUNCTION PANEL
Albumin: 2 g/dL — ABNORMAL LOW (ref 3.5–5.2)
BUN: 23 mg/dL (ref 6–23)
BUN: 27 mg/dL — ABNORMAL HIGH (ref 6–23)
Calcium: 8.3 mg/dL — ABNORMAL LOW (ref 8.4–10.5)
Creatinine, Ser: 3.01 mg/dL — ABNORMAL HIGH (ref 0.50–1.35)
Creatinine, Ser: 3.58 mg/dL — ABNORMAL HIGH (ref 0.50–1.35)
GFR calc non Af Amer: 24 mL/min — ABNORMAL LOW (ref >90)
Glucose, Bld: 107 mg/dL — ABNORMAL HIGH (ref 70–99)
Phosphorus: 3.7 mg/dL (ref 2.3–4.6)
Sodium: 139 mEq/L (ref 135–145)

## 2012-03-28 LAB — PREPARE RBC (CROSSMATCH)

## 2012-03-28 LAB — GLUCOSE, CAPILLARY: Glucose-Capillary: 170 mg/dL — ABNORMAL HIGH (ref 70–99)

## 2012-03-28 MED ORDER — LIDOCAINE-PRILOCAINE 2.5-2.5 % EX CREA: 1.0000 "application " | TOPICAL_CREAM | CUTANEOUS | Status: DC | PRN

## 2012-03-28 MED ORDER — LIDOCAINE HCL (PF) 1 % IJ SOLN: 5.0000 mL | INTRAMUSCULAR | Status: DC | PRN

## 2012-03-28 MED ORDER — PENTAFLUOROPROP-TETRAFLUOROETH EX AERO: 1.0000 "application " | INHALATION_SPRAY | CUTANEOUS | Status: DC | PRN

## 2012-03-28 MED ORDER — NEPRO/CARBSTEADY PO LIQD
237.0000 mL | ORAL | Status: DC | PRN
  Filled 2012-03-28: qty 237

## 2012-03-28 MED ORDER — SODIUM CHLORIDE 0.9 % IV SOLN: 100.0000 mL | INTRAVENOUS | Status: DC | PRN

## 2012-03-28 MED ORDER — HEPARIN SODIUM (PORCINE) 1000 UNIT/ML DIALYSIS
1000.0000 [IU] | INTRAMUSCULAR | Status: DC | PRN
  Filled 2012-03-28: qty 1

## 2012-03-28 MED ORDER — ALBUTEROL SULFATE (5 MG/ML) 0.5% IN NEBU
2.5000 mg | INHALATION_SOLUTION | RESPIRATORY_TRACT | Status: DC
  Administered 2012-03-28 – 2012-04-04 (×36): 2.5 mg via RESPIRATORY_TRACT
  Filled 2012-03-28 (×36): qty 0.5

## 2012-03-28 MED ORDER — ALTEPLASE 2 MG IJ SOLR
2.0000 mg | Freq: Once | INTRAMUSCULAR | Status: AC | PRN
  Filled 2012-03-28: qty 2

## 2012-03-28 MED ORDER — IOHEXOL 350 MG/ML SOLN
100.0000 mL | Freq: Once | INTRAVENOUS | Status: AC | PRN
  Administered 2012-03-28: 100 mL via INTRAVENOUS

## 2012-03-28 MED ORDER — NOREPINEPHRINE BITARTRATE 1 MG/ML IJ SOLN
2.0000 ug/min | INTRAMUSCULAR | Status: DC
  Administered 2012-03-28: 5 ug/min via INTRAVENOUS
  Filled 2012-03-28 (×2): qty 8

## 2012-03-28 MED ORDER — SODIUM CHLORIDE 0.9 % IV SOLN
100.0000 mL | INTRAVENOUS | Status: DC | PRN
  Administered 2012-04-02: 10 mL via INTRAVENOUS

## 2012-03-28 NOTE — Progress Notes (Signed)
In preparation for administration of unit of PRBC's, took patient's vital signs. Patient's BP was 82/51, HR 120, SpO2 86% on RA. Placed patient on 2LO2 and notified Dr.Ramiswamy of patient's low BP. Dr.Ramiswamy came and assessed patient. One unit of PRBC's was hung, Rapid Response Nurse was called in to assess patient as well. Per Dr.Ramiswamy patient transferred to [facility]2100. Called report to [place], RN on 2100. Transported patient to 2100 in bed with 2 L oxygen, on continuous telemetry. Patient belongings at his side to unit 2100.

## 2012-03-28 NOTE — Progress Notes (Signed)
*  PRELIMINARY RESULTS* Vascular Ultrasound Lower extremity venous duplex has been completed.  Preliminary findings: Evidence of non- occlusive DVT involving the right common femoral vein and popliteal vein. Evidence of non- occlusive DVT involving the left posterior tibial vein.  Farrel Demark, RDMS, RVT 03/28/2012, 1:48 PM

## 2012-03-28 NOTE — Progress Notes (Signed)
Pt profile: [name] with ESRD, chronic pain and h/o DVT who presented to the ED 10/27 with RLQ pain, hypotension, anemia due to Large subcapsular and retroperitoneal hematoma involving the right kidney and pararenal tissues (based on CT abd). Received 4 units PRBCs and FFP.  Admitted by PCCM to ICU. BP much improved AM 10/28 and transferred to Renal floor.   Active problems: Retroperitoneal hematoma ESRD H/O DVT H/O IVC filter placement DM2  Lines, Tubes, etc: R radial A-line 10/27 >> 10/28 R IJ CVL 10/28 >> 03/27/12 (removed in pm by night float over concern this might be arterial)  Microbiology: MRSA PCR 10/27 >> NEG Blood 10/27 >>   Antibiotics:  none  Studies/Events: CT abdomen/pelvis 10/27: Large subcapsular and retroperitoneal hematoma involving the right kidney and pararenal tissues.    Consults:  CCS (wilson) 10/27: Rec Urology consult. Signed off 10/28 Urology 10/27: recommends repeat CT abdomen in 6 weeks to evaluate the right kidney for a mass and hematoma resolution Renal 10/27: managing HD   Best Practice: DVT: SCDs SUP: PPI Nutrition: renal diet Glycemic control: N/I Sedation/analgesia: PRN morphine  EVENTS 03/27/12 - iin 6700. Got HD and PRBC. Rt Ij removed   SUBJECTIVE/OVERNIGHT/INTERVAL HX 03/28/12:   - RIJ "CVL"  Removed last nigth by night float MD Dr Earney Mallet over concern it might have been arterial and different location even if venous  - This am RN noticed drop in SBP 80s consistently when going to give PRBC. IN addition, pulse ox drop and needing nasal cannula oxygen -both new per RN  = Patient himself denies complaints   Obj: Filed Vitals:   03/28/12 1033  BP: 83/22  Pulse: 116  Temp:   Resp:     Gen: chronically ill appearing, no distress HEENT: WNL Neck: JVP cannot be assessed due to habitus Chest: Clear anteriorly, ? Left sided wheeze Cardiac: RRR s M Abd: Obese, soft, NT, NABS Ext: warm, trace bilateral ankle edema  BMET      Component Value Date/Time   NA 134* 03/28/2012 0505   K 3.8 03/28/2012 0505   CL 98 03/28/2012 0505   CO2 30 03/28/2012 0505   GLUCOSE 104* 03/28/2012 0505   BUN 23 03/28/2012 0505   CREATININE 3.01* 03/28/2012 0505   CALCIUM 8.3* 03/28/2012 0505   GFRNONAA 24* 03/28/2012 0505   GFRAA 28* 03/28/2012 0505    CBC    Component Value Date/Time   WBC 9.8 03/28/2012 0505   RBC 2.16* 03/28/2012 0505   HGB 6.9* 03/28/2012 0505   HCT 21.5* 03/28/2012 0505   PLT 120* 03/28/2012 0505   MCV 99.5 03/28/2012 0505   MCH 31.9 03/28/2012 0505   MCHC 32.1 03/28/2012 0505   RDW 19.2* 03/28/2012 0505   LYMPHSABS 1.5 08/24/2011 1030   MONOABS 0.7 08/24/2011 1030   EOSABS 0.0 08/24/2011 1030   BASOSABS 0.0 08/24/2011 1030    Lab 03/28/12 0505 03/27/12 2349 03/27/12 0704  HGB 6.9* 6.8* 6.6*  HCT 21.5* 20.9* 19.8*  WBC 9.8 10.4 13.4*  PLT 120* 123* 123*    Lab 03/28/12 0505 03/27/12 0704 03/25/12 0054  INR 1.16 1.19 2.16*    CXR No results found.      IMPRESSION/PLAN: 1) Large subcapsular and retroperitoneal hematoma involving the right kidney and pararenal tissues - likely due to excessive warfarin and uremic platelet dysfunction. S/p Off warfarin. Received FFP   0n 03/27/12: HGb <7gm%. No obvious bleed on bp/hr dysfunction. One unit PRBC given On  03/28/12 -  Hgb did not improve with PRBC yesterday and still < 7gm% but stable. New drop in sbp and desaturation. Concern for worsening RP hematoma v recurernt PE  PLAN   - give another 1 unit prbc stat on 03/28/12  - Monitor CBC and coags; repeat at 2.45pm 03/28/12  - move to ICU  - depending on course might need CVL  - Repeat Ct abdomen in 4-6 wks per Urology but might neeed earlier to document worsening hematoma  2) H/O DVT, s/p IVC filter  - If current desats represent PE, very bad prognosis because he cannot be anticoagulated or get another IVC filter  - r/o DVT, get dopplers stat  - CXR stat  - depending on course / results  of above  might need CT angiogram  - Too high risk to resume warfarin at any time, certainly no warfarin for 6-8 weeks  3) ESRD - HD per Renal service  4) DM2 - not requiring SSI  Cont to monitor 5) Transferred to Renal floor 10/28. Move back to ICU 03/28/12   31 mniutes critical care time   Dr. Kalman Shan, M.D., [facility].C.P Pulmonary and Critical Care Medicine Staff Physician [facility]  Pulmonary and Critical Care Pager: [phone], If no answer or between  15:00h - 7:00h: call [phone]  03/28/2012 11:21 AM

## 2012-03-28 NOTE — Procedures (Signed)
Asked by eMD to evaluate the patient with suspected arterial placement of CVL.  CXR 03/25/12 reports possible arterial placement.  Blood gas analysis of the sample obtained form the line on 03/26/12 demonstrate arterial values (SO2=98%).  Line removed.  Pressure held.  Sterile dressing applied.  No overt bleeding or hematoma noted.  RN instructed to monitor the site q15 minutes x 1 hour post removal.

## 2012-03-28 NOTE — Procedures (Signed)
Central Venous Catheter Insertion Procedure Note [name] [id number] [birth year]  Procedure: Insertion of Central Venous Catheter Indications: Drug and/or fluid administration and Frequent blood sampling  Procedure Details Consent: Risks of procedure as well as the alternatives and risks of each were explained to the (patient/caregiver).  Consent for procedure obtained. Time Out: Verified patient identification, verified procedure, site/side was marked, verified correct patient position, special equipment/implants available, medications/allergies/relevent history reviewed, required imaging and test results available.  Performed  Maximum sterile technique was used including antiseptics, cap, gloves, gown, hand hygiene, mask and sheet. Skin prep: Chlorhexidine; local anesthetic administered A antimicrobial bonded/coated triple lumen catheter was placed in the left internal jugular vein using the Seldinger technique.  Evaluation Blood flow good Complications: No apparent complications Patient did tolerate procedure well. Chest X-ray ordered to verify placement.  CXR: pending.   Ultrasound used for site verification, live visualisation of needle entry & guidewire prior to dilation  [name] V. 03/28/2012, 4:17 PM

## 2012-03-28 NOTE — Progress Notes (Signed)
Subjective: Interval History: has complaints weak.  Objective: Vital signs in last 24 hours: Temp:  [97.9 F (36.6 C)-98.8 F (37.1 C)] 98.8 F (37.1 C) (10/30 1024) Pulse Rate:  [55-120] 116  (10/30 1033) Resp:  [18-22] 18  (10/30 0837) BP: (72-98)/(22-54) 83/22 mmHg (10/30 1033) SpO2:  [86 %-98 %] 97 % (10/30 1033) Weight:  [156.5 kg (345 lb 0.3 oz)] 156.5 kg (345 lb 0.3 oz) (10/29 2010) Weight change: 2.1 kg (4 lb 10.1 oz)  Intake/Output from previous day: 10/29 0701 - 10/30 0700 In: 610 [P.O.:360; Blood:250] Out: 1130  Intake/Output this shift: Total I/O In: 240 [P.O.:240] Out: -   General appearance: alert, cooperative and morbidly obese Resp: rales bilaterally and rhonchi bilaterally Cardio: S1, S2 normal and systolic murmur: holosystolic 2/6, blowing at apex GI: obese, pos bs, liver down 6 cm Extremities: AVF LLA B&T  Lab Results:  Basename 03/28/12 0505 03/27/12 2349  WBC 9.8 10.4  HGB 6.9* 6.8*  HCT 21.5* 20.9*  PLT 120* 123*   BMET:  Basename 03/28/12 0505 03/27/12 0704  NA 134* 137  K 3.8 3.9  CL 98 98  CO2 30 27  GLUCOSE 104* 123*  BUN 23 50*  CREATININE 3.01* 5.13*  CALCIUM 8.3* 8.4   No results found for this basename: PTH:2 in the last 72 hours Iron Studies:  Basename 03/26/12 1000  IRON 31*  TIBC 189*  TRANSFERRIN --  FERRITIN --    Studies/Results: No results found.  I have reviewed the patient's current medications.  Assessment/Plan: 1 CRF stable , check K after tx.  HD in am 2 Low bp , follow closely  3 RP bleed no increment from 1 Unit yest, ongoing bleed 4 Morbid obesity P follow hb, K, HD   LOS: 3 days   Vernella Niznik L 03/28/2012,10:52 AM

## 2012-03-28 NOTE — Progress Notes (Signed)
Call from RN 8:33 AM on 03/28/2012 about low Hgb but stable vitals.   Lab 03/28/12 0505 03/27/12 2349 03/27/12 0704  HGB 6.9* 6.8* 6.6*  HCT 21.5* 20.9* 19.8*  WBC 9.8 10.4 13.4*  PLT 120* 123* 123*   Filed Vitals:   03/27/12 1235 03/27/12 1328 03/27/12 2010 03/28/12 0452  BP: 97/46 90/54 90/40  93/41  Pulse: 90 55 87 105  Temp: 97.9 F (36.6 C) 98.3 F (36.8 C) 97.9 F (36.6 C) 98.4 F (36.9 C)  TempSrc: Oral Oral Oral Oral  Resp: 20 20 20 19   Height:   6' (1.829 m)   Weight:   156.5 kg (345 lb 0.3 oz)   SpO2: 94% 98% 92% 91%     No obvious bleed  A Anemia ? Due to slow bleed v chronic/renal disease  P PRBC 1 unit REcheck cbc in pm    Dr. Kalman Shan, M.D., [facility].C.P Pulmonary and Critical Care Medicine Staff Physician [facility] [place] Pulmonary and Critical Care Pager: [phone], If no answer or between  15:00h - 7:00h: call [phone]  03/28/2012 8:34 AM

## 2012-03-29 ENCOUNTER — Inpatient Hospital Stay (HOSPITAL_COMMUNITY): Payer: BC Managed Care – PPO

## 2012-03-29 LAB — COMPREHENSIVE METABOLIC PANEL
ALT: 26 U/L (ref 0–53)
Alkaline Phosphatase: 85 U/L (ref 39–117)
Chloride: 95 mEq/L — ABNORMAL LOW (ref 96–112)
GFR calc Af Amer: 16 mL/min — ABNORMAL LOW (ref >90)
Glucose, Bld: 162 mg/dL — ABNORMAL HIGH (ref 70–99)
Potassium: 4.5 mEq/L (ref 3.5–5.1)
Sodium: 133 mEq/L — ABNORMAL LOW (ref 135–145)
Total Protein: 5.9 g/dL — ABNORMAL LOW (ref 6.0–8.3)

## 2012-03-29 LAB — TYPE AND SCREEN
Unit division: 0
Unit division: 0
Unit division: 0

## 2012-03-29 LAB — CK: Total CK: 23 U/L (ref 7–232)

## 2012-03-29 LAB — CBC
Platelets: 161 10*3/uL (ref 150–400)
RBC: 2.5 MIL/uL — ABNORMAL LOW (ref 4.22–5.81)
WBC: 13.3 10*3/uL — ABNORMAL HIGH (ref 4.0–10.5)

## 2012-03-29 LAB — TROPONIN I: Troponin I: 0.83 ng/mL (ref <0.30)

## 2012-03-29 MED ORDER — NOREPINEPHRINE BITARTRATE 1 MG/ML IJ SOLN
2.0000 ug/min | INTRAVENOUS | Status: DC
  Filled 2012-03-29 (×2): qty 4

## 2012-03-29 NOTE — Progress Notes (Signed)
CRITICAL VALUE ALERT  Critical value received: troponin 0.93  Date of notification:  03/29/12  Time of notification:  0929  Critical value read back:yes  Nurse who received alert:  Lina Sar, RN  MD notified (1st page):  Dr Mikel Cella  Time of first page: on unit  MD notified (2nd page):  Time of second page:  Responding MD:  Dr Mikel Cella  Time MD responded:  0930

## 2012-03-29 NOTE — Progress Notes (Signed)
Subjective: Interval History: has complaints weak, fatigue.  Objective: Vital signs in last 24 hours: Temp:  [97.9 F (36.6 C)-98.8 F (37.1 C)] 98.1 F (36.7 C) (10/31 0426) Pulse Rate:  [88-120] 101  (10/31 0700) Resp:  [15-27] 20  (10/31 0700) BP: (73-128)/(19-78) 119/54 mmHg (10/31 0700) SpO2:  [85 %-100 %] 98 % (10/31 0700) Weight:  [156.2 kg (344 lb 5.7 oz)] 156.2 kg (344 lb 5.7 oz) (10/31 0500) Weight change: -0.3 kg (-10.6 oz)  Intake/Output from previous day: 10/30 0701 - 10/31 0700 In: 1637.4 [P.O.:1000; I.V.:143.7; Blood:491.7; IV Piggyback:2] Out: -  Intake/Output this shift:    General appearance: cooperative, pale and slowed mentation Resp: diminished breath sounds bilaterally, rales bibasilar, rhonchi bilaterally and wheezes bilaterally Cardio: S1, S2 normal and systolic murmur: holosystolic 2/6, blowing at apex GI: obese,pos bs, soft, liver down 6 cm Extremities: edema 2+  Lab Results:  Basename 03/28/12 1535 03/28/12 0505  WBC 11.2* 9.8  HGB 7.6* 6.9*  HCT 23.6* 21.5*  PLT 125* 120*   BMET:  Basename 03/28/12 1552 03/28/12 0505  NA 139 134*  K 4.2 3.8  CL 102 98  CO2 31 30  GLUCOSE 107* 104*  BUN 27* 23  CREATININE 3.58* 3.01*  CALCIUM 8.3* 8.3*   No results found for this basename: PTH:2 in the last 72 hours Iron Studies:  Basename 03/26/12 1000  IRON 31*  TIBC 189*  TRANSFERRIN --  FERRITIN --    Studies/Results: Ct Abdomen Pelvis Wo Contrast  03/28/2012  *RADIOLOGY REPORT*  Clinical Data:  Right lower quadrant pain shortness of breath.  CT ANGIOGRAPHY CHEST WITH CONTRAST  Technique:  Multidetector CT imaging of the chest was performed using the standard protocol during bolus administration of intravenous contrast.  Multiplanar CT image reconstructions including MIPs were obtained to evaluate the vascular anatomy.  Contrast: OMNIPAQUE IOHEXOL 350 MG/ML SOLN  Comparison:  CTA abdomen pelvis 03/25/2012.  Findings:  Pulmonary arterial  opacification is satisfactory.  The bolus is suboptimal, degrading evaluation for pulmonary emboli in the segmental vessels.  No significant proximal embolus is present to at least the level of the lobe arteries.  The heart size is slightly enlarged.  A small pericardial effusion is present.  Small bilateral pleural effusions are noted.  No significant adenopathy is present.  The bilateral medial lower lobe airspace consolidation is present bilaterally.  Scattered areas of ground-glass attenuation present throughout both lungs.  Linear airspace consolidation is present in the medial left upper lobe.  There is posteromedial consolidation in the superior segment of the left lower lobe and within the upper lobe.  Review of the MIP images confirms the above findings.  IMPRESSION:  1.  No focal filling defects present to suggest a proximal pulmonary embolus.  Evaluation beyond the lobar arteries is severely degraded. 2.  Small bilateral pleural effusions are worse on the right. 3.  Consolidated airspace disease in the posteromedial lower lobes bilaterally. 4.  Mild cardiomegaly and a small pericardial effusion.  *RADIOLOGY REPORT*  Clinical Data:  Retroperitoneal hematoma  CT ABDOMEN AND PELVIS WITHOUT CONTRAST  Technique:  Multidetector CT imaging of the chest, abdomen and pelvis was performed following the standard protocol without IV contrast.  Comparison:   CTA abdomen pelvis 03/25/2012.  CT ABDOMEN AND PELVIS  Findings:  The liver and spleen are within normal limits. Postsurgical changes are again noted at the stomach.  A small hiatal hernia is present.  The duodenum and pancreas are within normal limits.  The  common bile duct is within normal limits following cholecystectomy.  Sub centimeter lymph nodes at the porta hepatis are stable.  A subcapsular hematoma of the right kidney is stable.  The additional retroperitoneal hematoma is stable as well.  There is no evidence for new or expanding hemorrhage.  Hemorrhage  extends into the retroperitoneal tissues of the pelvis.  The left kidney is atrophic.  The adrenal glands are normal bilaterally.  An IVC filter is in place.  The rectosigmoid colon is within normal limits.  The more proximal colon is unremarkable.  Contrast can be seen to the splenic flexure.  The appendix is visualized and normal.  Small bowel is unremarkable.  The peritoneal contents are displaced by the right retroperitoneal hematoma.  The bone windows demonstrate no focal lytic or blastic lesions.  No acute fractures are present.  IMPRESSION:  1.  Stable appearance of right subcapsular renal and retroperitoneal hematoma. 2.  No evidence for new or expanding hemorrhage. 3.  Mass effect with displacement of the intraperitoneal contents. 4.  Atherosclerosis. 5.  Left renal atrophy. 6.  Status post cholecystectomy.   Original Report Authenticated By: Jamesetta Orleans. MATTERN, M.D.    Ct Angio Chest Pe W/cm &/or Wo Cm  03/28/2012  *RADIOLOGY REPORT*  Clinical Data:  Right lower quadrant pain shortness of breath.  CT ANGIOGRAPHY CHEST WITH CONTRAST  Technique:  Multidetector CT imaging of the chest was performed using the standard protocol during bolus administration of intravenous contrast.  Multiplanar CT image reconstructions including MIPs were obtained to evaluate the vascular anatomy.  Contrast: OMNIPAQUE IOHEXOL 350 MG/ML SOLN  Comparison:  CTA abdomen pelvis 03/25/2012.  Findings:  Pulmonary arterial opacification is satisfactory.  The bolus is suboptimal, degrading evaluation for pulmonary emboli in the segmental vessels.  No significant proximal embolus is present to at least the level of the lobe arteries.  The heart size is slightly enlarged.  A small pericardial effusion is present.  Small bilateral pleural effusions are noted.  No significant adenopathy is present.  The bilateral medial lower lobe airspace consolidation is present bilaterally.  Scattered areas of ground-glass attenuation present  throughout both lungs.  Linear airspace consolidation is present in the medial left upper lobe.  There is posteromedial consolidation in the superior segment of the left lower lobe and within the upper lobe.  Review of the MIP images confirms the above findings.  IMPRESSION:  1.  No focal filling defects present to suggest a proximal pulmonary embolus.  Evaluation beyond the lobar arteries is severely degraded. 2.  Small bilateral pleural effusions are worse on the right. 3.  Consolidated airspace disease in the posteromedial lower lobes bilaterally. 4.  Mild cardiomegaly and a small pericardial effusion.  *RADIOLOGY REPORT*  Clinical Data:  Retroperitoneal hematoma  CT ABDOMEN AND PELVIS WITHOUT CONTRAST  Technique:  Multidetector CT imaging of the chest, abdomen and pelvis was performed following the standard protocol without IV contrast.  Comparison:   CTA abdomen pelvis 03/25/2012.  CT ABDOMEN AND PELVIS  Findings:  The liver and spleen are within normal limits. Postsurgical changes are again noted at the stomach.  A small hiatal hernia is present.  The duodenum and pancreas are within normal limits.  The common bile duct is within normal limits following cholecystectomy.  Sub centimeter lymph nodes at the porta hepatis are stable.  A subcapsular hematoma of the right kidney is stable.  The additional retroperitoneal hematoma is stable as well.  There is no evidence for new  or expanding hemorrhage.  Hemorrhage extends into the retroperitoneal tissues of the pelvis.  The left kidney is atrophic.  The adrenal glands are normal bilaterally.  An IVC filter is in place.  The rectosigmoid colon is within normal limits.  The more proximal colon is unremarkable.  Contrast can be seen to the splenic flexure.  The appendix is visualized and normal.  Small bowel is unremarkable.  The peritoneal contents are displaced by the right retroperitoneal hematoma.  The bone windows demonstrate no focal lytic or blastic lesions.  No  acute fractures are present.  IMPRESSION:  1.  Stable appearance of right subcapsular renal and retroperitoneal hematoma. 2.  No evidence for new or expanding hemorrhage. 3.  Mass effect with displacement of the intraperitoneal contents. 4.  Atherosclerosis. 5.  Left renal atrophy. 6.  Status post cholecystectomy.   Original Report Authenticated By: Jamesetta Orleans. MATTERN, M.D.    Dg Chest Port 1 View  03/28/2012  *RADIOLOGY REPORT*  Clinical Data: Central line placement.  PORTABLE CHEST - 1 VIEW  Comparison: 03/28/2012  Findings: Left central line is in place with the tip in the central left innominate vein.  No pneumothorax.  Cardiomegaly with vascular congestion.  Bibasilar atelectasis.  The suspect mild interstitial edema.  IMPRESSION: Left central line tip in the central left innominate vein.  No pneumothorax.  Continued bibasilar atelectasis and probable mild interstitial edema.   Original Report Authenticated By: Cyndie Chime, M.D.    Dg Chest Port 1 View  03/28/2012  *RADIOLOGY REPORT*  Clinical Data: Respiratory distress and chest pain.  Rule out pulmonary embolism.  PORTABLE CHEST - 1 VIEW  Comparison: 03/25/2012  Findings: Mildly degraded exam due to AP portable technique and patient body habitus.  The chin overlies the apices.  Moderate cardiomegaly, accentuated by low lung volumes.  Interval removal of right internal jugular line.  No right-sided pleural effusion.  Left pleural space poorly evaluated. No pneumothorax.  Mild interstitial edema, superimposed upon low lung volumes.  Linear opacity in the left apex is favored to represent atelectasis.  There is volume loss in the infrahilar regions bilaterally with patchy bibasilar airspace disease.  IMPRESSION:  1.  Cardiomegaly and mild interstitial edema.  This is superimposed upon low lung volumes. 2.  Volume loss in the infrahilar regions.  Airspace disease at the lung bases which could represent atelectasis. 3.  Multifactorial degradation,  including patient body habitus and low-volume AP portable technique.   Original Report Authenticated By: Consuello Bossier, M.D.     I have reviewed the patient's current medications.  Assessment/Plan: 1 CRF for HD.  No vol off 2 Low BP hemorrhage.  R/O infx, cardiac causes 3 Anemia stable 6U thus far 4 Renal bleed presumeably cause of low bp. 5 HPTH 6 Massive obesity P follow Hb, epo, fe, support bp, check enz    LOS: 4 days   [name] L 03/29/2012,7:16 AM

## 2012-03-29 NOTE — Progress Notes (Signed)
Pt profile: [name] with ESRD, chronic pain and h/o DVT who presented to the ED 10/27 with RLQ pain, hypotension, anemia due to Large subcapsular and retroperitoneal hematoma involving the right kidney and pararenal tissues (based on CT abd). Received 4 units PRBCs and FFP.  Admitted by PCCM to ICU. BP much improved AM 10/28 and transferred to Renal floor.   Active problems: Retroperitoneal hematoma ESRD H/O DVT H/O IVC filter placement DM2  Lines, Tubes, etc: R radial A-line 10/27 >> 10/28 R IJ CVL 10/28 >> 03/27/12 (removed in pm by night float over concern this might be arterial) [facility] CVL 10/30 >>  Microbiology: MRSA PCR 10/27 >> NEG Blood 10/27 >> neg  Antibiotics:  none  Studies/Events: CT abdomen/pelvis 10/27: Large subcapsular and retroperitoneal hematoma involving the right kidney and pararenal tissues.  10/30 >> CT abdomen - Stable appearance of right subcapsular renal and retroperitoneal hematoma CT angio neg for PE    Consults:  CCS (wilson) 10/27: Rec Urology consult. Signed off 10/28 Urology 10/27: recommends repeat CT abdomen in 6 weeks to evaluate the right kidney for a mass and hematoma resolution Renal 10/27: managing HD   Best Practice: DVT: SCDs SUP: PPI Nutrition: renal diet Glycemic control: N/I Sedation/analgesia: PRN morphine  EVENTS 03/27/12 - iin 6700. Got HD and PRBC. Rt Ij removed   SUBJECTIVE/OVERNIGHT/INTERVAL HX Denies CP, dyspnea  Obj: Filed Vitals:   03/29/12 1815  BP: 92/57  Pulse: 93  Temp:   Resp: 19    Gen: chronically ill appearing, no distress HEENT: WNL Neck: JVP cannot be assessed due to habitus Chest: Clear anteriorly, coarse BS bl Cardiac: RRR s M Abd: Obese, soft, NT, NABS Ext: warm, trace bilateral ankle edema  BMET    Component Value Date/Time   NA 133* 03/29/2012 1317   K 4.5 03/29/2012 1317   CL 95* 03/29/2012 1317   CO2 26 03/29/2012 1317   GLUCOSE 162* 03/29/2012 1317   BUN 36* 03/29/2012 1317   CREATININE 4.64* 03/29/2012 1317   CALCIUM 8.7 03/29/2012 1317   GFRNONAA 14* 03/29/2012 1317   GFRAA 16* 03/29/2012 1317    CBC    Component Value Date/Time   WBC 13.3* 03/29/2012 1702   RBC 2.50* 03/29/2012 1702   HGB 7.8* 03/29/2012 1702   HCT 24.5* 03/29/2012 1702   PLT 161 03/29/2012 1702   MCV 98.0 03/29/2012 1702   MCH 31.2 03/29/2012 1702   MCHC 31.8 03/29/2012 1702   RDW 20.1* 03/29/2012 1702   LYMPHSABS 1.5 08/24/2011 1030   MONOABS 0.7 08/24/2011 1030   EOSABS 0.0 08/24/2011 1030   BASOSABS 0.0 08/24/2011 1030    Lab 03/29/12 1702 03/28/12 1535 03/28/12 0505  HGB 7.8* 7.6* 6.9*  HCT 24.5* 23.6* 21.5*  WBC 13.3* 11.2* 9.8  PLT 161 125* 120*    Lab 03/28/12 0505 03/27/12 0704 03/25/12 0054  INR 1.16 1.19 2.16*    CXR Ct Abdomen Pelvis Wo Contrast  03/28/2012  *RADIOLOGY REPORT*  Clinical Data:  Right lower quadrant pain shortness of breath.  CT ANGIOGRAPHY CHEST WITH CONTRAST  Technique:  Multidetector CT imaging of the chest was performed using the standard protocol during bolus administration of intravenous contrast.  Multiplanar CT image reconstructions including MIPs were obtained to evaluate the vascular anatomy.  Contrast: OMNIPAQUE IOHEXOL 350 MG/ML SOLN  Comparison:  CTA abdomen pelvis 03/25/2012.  Findings:  Pulmonary arterial opacification is satisfactory.  The bolus is suboptimal, degrading evaluation for pulmonary emboli in the segmental vessels.  No significant proximal embolus is present to at least the level of the lobe arteries.  The heart size is slightly enlarged.  A small pericardial effusion is present.  Small bilateral pleural effusions are noted.  No significant adenopathy is present.  The bilateral medial lower lobe airspace consolidation is present bilaterally.  Scattered areas of ground-glass attenuation present throughout both lungs.  Linear airspace consolidation is present in the medial left upper lobe.  There is posteromedial consolidation  in the superior segment of the left lower lobe and within the upper lobe.  Review of the MIP images confirms the above findings.  IMPRESSION:  1.  No focal filling defects present to suggest a proximal pulmonary embolus.  Evaluation beyond the lobar arteries is severely degraded. 2.  Small bilateral pleural effusions are worse on the right. 3.  Consolidated airspace disease in the posteromedial lower lobes bilaterally. 4.  Mild cardiomegaly and a small pericardial effusion.  *RADIOLOGY REPORT*  Clinical Data:  Retroperitoneal hematoma  CT ABDOMEN AND PELVIS WITHOUT CONTRAST  Technique:  Multidetector CT imaging of the chest, abdomen and pelvis was performed following the standard protocol without IV contrast.  Comparison:   CTA abdomen pelvis 03/25/2012.  CT ABDOMEN AND PELVIS  Findings:  The liver and spleen are within normal limits. Postsurgical changes are again noted at the stomach.  A small hiatal hernia is present.  The duodenum and pancreas are within normal limits.  The common bile duct is within normal limits following cholecystectomy.  Sub centimeter lymph nodes at the porta hepatis are stable.  A subcapsular hematoma of the right kidney is stable.  The additional retroperitoneal hematoma is stable as well.  There is no evidence for new or expanding hemorrhage.  Hemorrhage extends into the retroperitoneal tissues of the pelvis.  The left kidney is atrophic.  The adrenal glands are normal bilaterally.  An IVC filter is in place.  The rectosigmoid colon is within normal limits.  The more proximal colon is unremarkable.  Contrast can be seen to the splenic flexure.  The appendix is visualized and normal.  Small bowel is unremarkable.  The peritoneal contents are displaced by the right retroperitoneal hematoma.  The bone windows demonstrate no focal lytic or blastic lesions.  No acute fractures are present.  IMPRESSION:  1.  Stable appearance of right subcapsular renal and retroperitoneal hematoma. 2.  No  evidence for new or expanding hemorrhage. 3.  Mass effect with displacement of the intraperitoneal contents. 4.  Atherosclerosis. 5.  Left renal atrophy. 6.  Status post cholecystectomy.   Original Report Authenticated By: Jamesetta Orleans. MATTERN, M.D.    Ct Angio Chest Pe W/cm &/or Wo Cm  03/28/2012  *RADIOLOGY REPORT*  Clinical Data:  Right lower quadrant pain shortness of breath.  CT ANGIOGRAPHY CHEST WITH CONTRAST  Technique:  Multidetector CT imaging of the chest was performed using the standard protocol during bolus administration of intravenous contrast.  Multiplanar CT image reconstructions including MIPs were obtained to evaluate the vascular anatomy.  Contrast: OMNIPAQUE IOHEXOL 350 MG/ML SOLN  Comparison:  CTA abdomen pelvis 03/25/2012.  Findings:  Pulmonary arterial opacification is satisfactory.  The bolus is suboptimal, degrading evaluation for pulmonary emboli in the segmental vessels.  No significant proximal embolus is present to at least the level of the lobe arteries.  The heart size is slightly enlarged.  A small pericardial effusion is present.  Small bilateral pleural effusions are noted.  No significant adenopathy is present.  The bilateral medial  lower lobe airspace consolidation is present bilaterally.  Scattered areas of ground-glass attenuation present throughout both lungs.  Linear airspace consolidation is present in the medial left upper lobe.  There is posteromedial consolidation in the superior segment of the left lower lobe and within the upper lobe.  Review of the MIP images confirms the above findings.  IMPRESSION:  1.  No focal filling defects present to suggest a proximal pulmonary embolus.  Evaluation beyond the lobar arteries is severely degraded. 2.  Small bilateral pleural effusions are worse on the right. 3.  Consolidated airspace disease in the posteromedial lower lobes bilaterally. 4.  Mild cardiomegaly and a small pericardial effusion.  *RADIOLOGY REPORT*   Clinical Data:  Retroperitoneal hematoma  CT ABDOMEN AND PELVIS WITHOUT CONTRAST  Technique:  Multidetector CT imaging of the chest, abdomen and pelvis was performed following the standard protocol without IV contrast.  Comparison:   CTA abdomen pelvis 03/25/2012.  CT ABDOMEN AND PELVIS  Findings:  The liver and spleen are within normal limits. Postsurgical changes are again noted at the stomach.  A small hiatal hernia is present.  The duodenum and pancreas are within normal limits.  The common bile duct is within normal limits following cholecystectomy.  Sub centimeter lymph nodes at the porta hepatis are stable.  A subcapsular hematoma of the right kidney is stable.  The additional retroperitoneal hematoma is stable as well.  There is no evidence for new or expanding hemorrhage.  Hemorrhage extends into the retroperitoneal tissues of the pelvis.  The left kidney is atrophic.  The adrenal glands are normal bilaterally.  An IVC filter is in place.  The rectosigmoid colon is within normal limits.  The more proximal colon is unremarkable.  Contrast can be seen to the splenic flexure.  The appendix is visualized and normal.  Small bowel is unremarkable.  The peritoneal contents are displaced by the right retroperitoneal hematoma.  The bone windows demonstrate no focal lytic or blastic lesions.  No acute fractures are present.  IMPRESSION:  1.  Stable appearance of right subcapsular renal and retroperitoneal hematoma. 2.  No evidence for new or expanding hemorrhage. 3.  Mass effect with displacement of the intraperitoneal contents. 4.  Atherosclerosis. 5.  Left renal atrophy. 6.  Status post cholecystectomy.   Original Report Authenticated By: Jamesetta Orleans. MATTERN, M.D.    Dg Chest Port 1 View  03/28/2012  *RADIOLOGY REPORT*  Clinical Data: Central line placement.  PORTABLE CHEST - 1 VIEW  Comparison: 03/28/2012  Findings: Left central line is in place with the tip in the central left innominate vein.  No  pneumothorax.  Cardiomegaly with vascular congestion.  Bibasilar atelectasis.  The suspect mild interstitial edema.  IMPRESSION: Left central line tip in the central left innominate vein.  No pneumothorax.  Continued bibasilar atelectasis and probable mild interstitial edema.   Original Report Authenticated By: Cyndie Chime, M.D.    Dg Chest Port 1 View  03/28/2012  *RADIOLOGY REPORT*  Clinical Data: Respiratory distress and chest pain.  Rule out pulmonary embolism.  PORTABLE CHEST - 1 VIEW  Comparison: 03/25/2012  Findings: Mildly degraded exam due to AP portable technique and patient body habitus.  The chin overlies the apices.  Moderate cardiomegaly, accentuated by low lung volumes.  Interval removal of right internal jugular line.  No right-sided pleural effusion.  Left pleural space poorly evaluated. No pneumothorax.  Mild interstitial edema, superimposed upon low lung volumes.  Linear opacity in the left apex is favored  to represent atelectasis.  There is volume loss in the infrahilar regions bilaterally with patchy bibasilar airspace disease.  IMPRESSION:  1.  Cardiomegaly and mild interstitial edema.  This is superimposed upon low lung volumes. 2.  Volume loss in the infrahilar regions.  Airspace disease at the lung bases which could represent atelectasis. 3.  Multifactorial degradation, including patient body habitus and low-volume AP portable technique.   Original Report Authenticated By: Consuello Bossier, M.D.         IMPRESSION/PLAN: 1) Large subcapsular and retroperitoneal hematoma involving the right kidney and pararenal tissues - likely due to excessive warfarin and uremic platelet dysfunction. S/p Off warfarin. Received FFP   0n 03/27/12: HGb <7gm%. No obvious bleed on bp/hr dysfunction. One unit PRBC given On  03/28/12 - Hgb did not improve with PRBC yesterday and still < 7gm% but stable. New drop in sbp and desaturation. >> 1 U PRBC  PLAN - stable hematoma     2) H/O DVT, s/p  IVC filter     dopplers 10/30 - non- occlusive DVT involving the right common femoral vein and popliteal vein. Evidence of non- occlusive DVT involving the left posterior tibial vein.   - Too high risk to resume warfarin at any time, certainly no warfarin for 6-8 weeks  3) Pos trops - ? Denotes cardiovascular event due to stress- needs evaln for underlying CAD in future 4) ESRD - HD per Renal service  5) DM2 - not requiring SSI  Cont to monitor  Transferred to Renal floor 10/28. Moved back to ICU 03/28/12, can tr to SDU 10/31  Cyril Mourning MD. FCCP. [facility] Pager [phone] If no response call [phone]     03/29/2012 6:27 PM

## 2012-03-29 NOTE — Progress Notes (Signed)
Name: [name] MRN: [id number] DOB: Oct 27, 1965  ELECTRONIC ICU PHYSICIAN NOTE  Problem:  Shock, still levophed dep s/p HD  Intervention:  Check cvp, titrate levophed  Sandrea Hughs 03/29/2012, 10:10 PM

## 2012-03-30 DIAGNOSIS — I319 Disease of pericardium, unspecified: Secondary | ICD-10-CM

## 2012-03-30 LAB — CORTISOL: Cortisol, Plasma: 19.6 ug/dL

## 2012-03-30 LAB — COMPREHENSIVE METABOLIC PANEL
CO2: 29 mEq/L (ref 19–32)
Calcium: 8.8 mg/dL (ref 8.4–10.5)
Creatinine, Ser: 3 mg/dL — ABNORMAL HIGH (ref 0.50–1.35)
GFR calc Af Amer: 28 mL/min — ABNORMAL LOW (ref >90)
GFR calc non Af Amer: 24 mL/min — ABNORMAL LOW (ref >90)
Glucose, Bld: 131 mg/dL — ABNORMAL HIGH (ref 70–99)

## 2012-03-30 LAB — CBC
Hemoglobin: 7.9 g/dL — ABNORMAL LOW (ref 13.0–17.0)
MCH: 31.3 pg (ref 26.0–34.0)
MCV: 100 fL (ref 78.0–100.0)
Platelets: 196 10*3/uL (ref 150–400)
RBC: 2.52 MIL/uL — ABNORMAL LOW (ref 4.22–5.81)

## 2012-03-30 LAB — PROCALCITONIN: Procalcitonin: 1.87 ng/mL

## 2012-03-30 LAB — PHOSPHORUS: Phosphorus: 3.2 mg/dL (ref 2.3–4.6)

## 2012-03-30 LAB — TROPONIN I: Troponin I: 0.89 ng/mL (ref <0.30)

## 2012-03-30 MED ORDER — SODIUM CHLORIDE 0.9 % IV SOLN: Freq: Once | INTRAVENOUS | Status: AC

## 2012-03-30 MED ORDER — HYDROCORTISONE SOD SUCCINATE 100 MG IJ SOLR
50.0000 mg | Freq: Three times a day (TID) | INTRAMUSCULAR | Status: DC
  Administered 2012-03-30 – 2012-04-02 (×9): 50 mg via INTRAVENOUS
  Filled 2012-03-30 (×12): qty 1

## 2012-03-30 MED ORDER — NOREPINEPHRINE BITARTRATE 1 MG/ML IJ SOLN
2.0000 ug/min | INTRAVENOUS | Status: DC
  Administered 2012-03-30: 15 ug/min via INTRAVENOUS
  Administered 2012-03-31: 4 ug/min via INTRAVENOUS
  Filled 2012-03-30 (×2): qty 16

## 2012-03-30 NOTE — Progress Notes (Signed)
[name] [id number]/2115-01   Pt profile: [name] with ESRD, chronic pain and h/o DVT who presented to the ED 10/27 with RLQ pain, hypotension, anemia due to Large subcapsular and retroperitoneal hematoma involving the right kidney and pararenal tissues (based on CT abd). Received 4 units PRBCs and FFP.  Admitted by PCCM to ICU. BP much improved AM 10/28 and transferred to Renal floor. Returned to ICU on 10/30 for hypotension.    Active problems: Retroperitoneal hematoma ESRD H/O DVT H/O IVC filter placement DM2  Lines, Tubes, etc: R radial A-line 10/27 >> 10/28 R IJ CVL 10/28 >> 03/27/12 (removed in pm by night float over concern this might be arterial) [facility] CVL 10/30 >>  Microbiology: MRSA PCR 10/27 >> NEG Blood 10/27 >> neg  Antibiotics:  none  Studies/Events: 10/27 CT abdomen/pelvis: Large subcapsular and retroperitoneal hematoma involving the right kidney and pararenal tissues.  03/27/12 - iin 6700. Got HD and PRBC. Rt Ij removed 10/30 >> CT abdomen - Stable appearance of right subcapsular renal and retroperitoneal hematoma CT angio neg for PE  Consults:  [facility] (wilson) 10/27: Rec Urology consult. Signed off 10/28 Urology 10/27: recommends repeat CT abdomen in 6 weeks to evaluate the right kidney for a mass and hematoma resolution Renal 10/27: managing HD   Best Practice: DVT: SCDs SUP: PPI Nutrition: renal diet Glycemic control: N/I Sedation/analgesia: PRN morphine  SUBJECTIVE/OVERNIGHT/INTERVAL HX Denies CP, dyspnea Restarted on levophed   Obj: Filed Vitals:   03/30/12 0700  BP: 115/62  Pulse: 94  Temp:   Resp: 17    Gen: chronically ill appearing, no distress, morbidly obese  HEENT: WNL Neck: JVP cannot be assessed due to habitus Chest: Clear anteriorly, coarse BS bl Cardiac: RRR s M Abd: Obese, soft, NT, NABS Ext: warm, trace bilateral ankle edema Buttocks: no wound  BMET    Component Value Date/Time   NA 137 03/30/2012 0500   K  3.6 03/30/2012 0500   CL 100 03/30/2012 0500   CO2 29 03/30/2012 0500   GLUCOSE 131* 03/30/2012 0500   BUN 17 03/30/2012 0500   CREATININE 3.00* 03/30/2012 0500   CALCIUM 8.8 03/30/2012 0500   GFRNONAA 24* 03/30/2012 0500   GFRAA 28* 03/30/2012 0500    CBC    Component Value Date/Time   WBC 13.9* 03/30/2012 0500   RBC 2.52* 03/30/2012 0500   HGB 7.9* 03/30/2012 0500   HCT 25.2* 03/30/2012 0500   PLT 196 03/30/2012 0500   MCV 100.0 03/30/2012 0500   MCH 31.3 03/30/2012 0500   MCHC 31.3 03/30/2012 0500   RDW 20.3* 03/30/2012 0500   LYMPHSABS 1.5 08/24/2011 1030   MONOABS 0.7 08/24/2011 1030   EOSABS 0.0 08/24/2011 1030   BASOSABS 0.0 08/24/2011 1030    Lab 03/30/12 0500 03/29/12 1702 03/28/12 1535  HGB 7.9* 7.8* 7.6*  HCT 25.2* 24.5* 23.6*  WBC 13.9* 13.3* 11.2*  PLT 196 161 125*    Lab 03/28/12 0505 03/27/12 0704 03/25/12 0054  INR 1.16 1.19 2.16*    CXR Ct Abdomen Pelvis Wo Contrast  03/28/2012  *RADIOLOGY REPORT*  Clinical Data:  Right lower quadrant pain shortness of breath.  CT ANGIOGRAPHY CHEST WITH CONTRAST  Technique:  Multidetector CT imaging of the chest was performed using the standard protocol during bolus administration of intravenous contrast.  Multiplanar CT image reconstructions including MIPs were obtained to evaluate the vascular anatomy.  Contrast: OMNIPAQUE IOHEXOL 350 MG/ML SOLN  Comparison:  CTA abdomen pelvis 03/25/2012.  Findings:  Pulmonary arterial opacification is satisfactory.  The bolus is suboptimal, degrading evaluation for pulmonary emboli in the segmental vessels.  No significant proximal embolus is present to at least the level of the lobe arteries.  The heart size is slightly enlarged.  A small pericardial effusion is present.  Small bilateral pleural effusions are noted.  No significant adenopathy is present.  The bilateral medial lower lobe airspace consolidation is present bilaterally.  Scattered areas of ground-glass attenuation present throughout both  lungs.  Linear airspace consolidation is present in the medial left upper lobe.  There is posteromedial consolidation in the superior segment of the left lower lobe and within the upper lobe.  Review of the MIP images confirms the above findings.  IMPRESSION:  1.  No focal filling defects present to suggest a proximal pulmonary embolus.  Evaluation beyond the lobar arteries is severely degraded. 2.  Small bilateral pleural effusions are worse on the right. 3.  Consolidated airspace disease in the posteromedial lower lobes bilaterally. 4.  Mild cardiomegaly and a small pericardial effusion.  *RADIOLOGY REPORT*  Clinical Data:  Retroperitoneal hematoma  CT ABDOMEN AND PELVIS WITHOUT CONTRAST  Technique:  Multidetector CT imaging of the chest, abdomen and pelvis was performed following the standard protocol without IV contrast.  Comparison:   CTA abdomen pelvis 03/25/2012.  CT ABDOMEN AND PELVIS  Findings:  The liver and spleen are within normal limits. Postsurgical changes are again noted at the stomach.  A small hiatal hernia is present.  The duodenum and pancreas are within normal limits.  The common bile duct is within normal limits following cholecystectomy.  Sub centimeter lymph nodes at the porta hepatis are stable.  A subcapsular hematoma of the right kidney is stable.  The additional retroperitoneal hematoma is stable as well.  There is no evidence for new or expanding hemorrhage.  Hemorrhage extends into the retroperitoneal tissues of the pelvis.  The left kidney is atrophic.  The adrenal glands are normal bilaterally.  An IVC filter is in place.  The rectosigmoid colon is within normal limits.  The more proximal colon is unremarkable.  Contrast can be seen to the splenic flexure.  The appendix is visualized and normal.  Small bowel is unremarkable.  The peritoneal contents are displaced by the right retroperitoneal hematoma.  The bone windows demonstrate no focal lytic or blastic lesions.  No acute fractures  are present.  IMPRESSION:  1.  Stable appearance of right subcapsular renal and retroperitoneal hematoma. 2.  No evidence for new or expanding hemorrhage. 3.  Mass effect with displacement of the intraperitoneal contents. 4.  Atherosclerosis. 5.  Left renal atrophy. 6.  Status post cholecystectomy.   Original Report Authenticated By: Jamesetta Orleans. MATTERN, M.D.    Ct Angio Chest Pe W/cm &/or Wo Cm  03/28/2012  *RADIOLOGY REPORT*  Clinical Data:  Right lower quadrant pain shortness of breath.  CT ANGIOGRAPHY CHEST WITH CONTRAST  Technique:  Multidetector CT imaging of the chest was performed using the standard protocol during bolus administration of intravenous contrast.  Multiplanar CT image reconstructions including MIPs were obtained to evaluate the vascular anatomy.  Contrast: OMNIPAQUE IOHEXOL 350 MG/ML SOLN  Comparison:  CTA abdomen pelvis 03/25/2012.  Findings:  Pulmonary arterial opacification is satisfactory.  The bolus is suboptimal, degrading evaluation for pulmonary emboli in the segmental vessels.  No significant proximal embolus is present to at least the level of the lobe arteries.  The heart size is slightly enlarged.  A small pericardial  effusion is present.  Small bilateral pleural effusions are noted.  No significant adenopathy is present.  The bilateral medial lower lobe airspace consolidation is present bilaterally.  Scattered areas of ground-glass attenuation present throughout both lungs.  Linear airspace consolidation is present in the medial left upper lobe.  There is posteromedial consolidation in the superior segment of the left lower lobe and within the upper lobe.  Review of the MIP images confirms the above findings.  IMPRESSION:  1.  No focal filling defects present to suggest a proximal pulmonary embolus.  Evaluation beyond the lobar arteries is severely degraded. 2.  Small bilateral pleural effusions are worse on the right. 3.  Consolidated airspace disease in the  posteromedial lower lobes bilaterally. 4.  Mild cardiomegaly and a small pericardial effusion.  *RADIOLOGY REPORT*  Clinical Data:  Retroperitoneal hematoma  CT ABDOMEN AND PELVIS WITHOUT CONTRAST  Technique:  Multidetector CT imaging of the chest, abdomen and pelvis was performed following the standard protocol without IV contrast.  Comparison:   CTA abdomen pelvis 03/25/2012.  CT ABDOMEN AND PELVIS  Findings:  The liver and spleen are within normal limits. Postsurgical changes are again noted at the stomach.  A small hiatal hernia is present.  The duodenum and pancreas are within normal limits.  The common bile duct is within normal limits following cholecystectomy.  Sub centimeter lymph nodes at the porta hepatis are stable.  A subcapsular hematoma of the right kidney is stable.  The additional retroperitoneal hematoma is stable as well.  There is no evidence for new or expanding hemorrhage.  Hemorrhage extends into the retroperitoneal tissues of the pelvis.  The left kidney is atrophic.  The adrenal glands are normal bilaterally.  An IVC filter is in place.  The rectosigmoid colon is within normal limits.  The more proximal colon is unremarkable.  Contrast can be seen to the splenic flexure.  The appendix is visualized and normal.  Small bowel is unremarkable.  The peritoneal contents are displaced by the right retroperitoneal hematoma.  The bone windows demonstrate no focal lytic or blastic lesions.  No acute fractures are present.  IMPRESSION:  1.  Stable appearance of right subcapsular renal and retroperitoneal hematoma. 2.  No evidence for new or expanding hemorrhage. 3.  Mass effect with displacement of the intraperitoneal contents. 4.  Atherosclerosis. 5.  Left renal atrophy. 6.  Status post cholecystectomy.   Original Report Authenticated By: Jamesetta Orleans. MATTERN, M.D.    Dg Chest Port 1 View  03/28/2012  *RADIOLOGY REPORT*  Clinical Data: Central line placement.  PORTABLE CHEST - 1 VIEW   Comparison: 03/28/2012  Findings: Left central line is in place with the tip in the central left innominate vein.  No pneumothorax.  Cardiomegaly with vascular congestion.  Bibasilar atelectasis.  The suspect mild interstitial edema.  IMPRESSION: Left central line tip in the central left innominate vein.  No pneumothorax.  Continued bibasilar atelectasis and probable mild interstitial edema.   Original Report Authenticated By: Cyndie Chime, M.D.    Dg Chest Port 1 View  03/28/2012  *RADIOLOGY REPORT*  Clinical Data: Respiratory distress and chest pain.  Rule out pulmonary embolism.  PORTABLE CHEST - 1 VIEW  Comparison: 03/25/2012  Findings: Mildly degraded exam due to AP portable technique and patient body habitus.  The chin overlies the apices.  Moderate cardiomegaly, accentuated by low lung volumes.  Interval removal of right internal jugular line.  No right-sided pleural effusion.  Left pleural space poorly evaluated.  No pneumothorax.  Mild interstitial edema, superimposed upon low lung volumes.  Linear opacity in the left apex is favored to represent atelectasis.  There is volume loss in the infrahilar regions bilaterally with patchy bibasilar airspace disease.  IMPRESSION:  1.  Cardiomegaly and mild interstitial edema.  This is superimposed upon low lung volumes. 2.  Volume loss in the infrahilar regions.  Airspace disease at the lung bases which could represent atelectasis. 3.  Multifactorial degradation, including patient body habitus and low-volume AP portable technique.   Original Report Authenticated By: Consuello Bossier, M.D.         IMPRESSION/PLAN: 1) Large subcapsular and retroperitoneal hematoma involving the right kidney and pararenal tissues - likely due to excessive warfarin and uremic platelet dysfunction. S/p Off warfarin. Received FFP   0n 03/27/12: HGb <7gm%. No obvious bleed on bp/hr dysfunction. One unit PRBC given On  03/28/12 - Hgb did not improve with PRBC yesterday and  still < 7gm% but stable. New drop in sbp and desaturation. >> 1 U PRBC  PLAN - stable hct     2) H/O DVT, s/p IVC filter     dopplers 10/30 - non- occlusive DVT involving the right common femoral vein and popliteal vein. Evidence of non- occlusive DVT involving the left posterior tibial vein.   - Too high risk to resume warfarin at any time, certainly no warfarin for 6-8 weeks  3) Pos trops - ? Denotes cardiovascular event due to stress- needs evaln for underlying CAD in future  4) ESRD - HD per Renal service, on nulecit and aranesp  5) DM2 - not requiring SSI  Cont to monitor  6) Hypotension - patient on levophed; unclear cause of shock - ? adrenal insuff  give stress dosed steroids, f/u cortisol   Get procalcitonin           -await echo results  .Si Raider Clinton Sawyer, MD, MBA 03/30/2012, 3:28 PM Family Medicine Resident, PGY-2 [phone] pager  Cyril Mourning MD. FCCP. [facility] Pager [phone] If no response call [phone]

## 2012-03-30 NOTE — Progress Notes (Signed)
Pt is on contact precautions.  Wife visiting without donning PPE for second day in a row; educated her 03/29/12 regarding need for PPE and asked her to wear PPE.  She refuses to.

## 2012-03-30 NOTE — Progress Notes (Signed)
  Echocardiogram 2D Echocardiogram has been performed.  [name] 03/30/2012, 3:32 PM

## 2012-03-30 NOTE — Progress Notes (Signed)
Subjective: Interval History: none.  Objective: Vital signs in last 24 hours: Temp:  [97.3 F (36.3 C)-99.4 F (37.4 C)] 99.4 F (37.4 C) (11/01 0425) Pulse Rate:  [90-103] 94  (11/01 0700) Resp:  [14-22] 17  (11/01 0700) BP: (89-124)/(43-75) 115/62 mmHg (11/01 0700) SpO2:  [94 %-100 %] 95 % (11/01 0700) Weight:  [152 kg (335 lb 1.6 oz)-152.2 kg (335 lb 8.6 oz)] 152.2 kg (335 lb 8.6 oz) (10/31 1838) Weight change: -4.2 kg (-9 lb 4.2 oz)  Intake/Output from previous day: 10/31 0701 - 11/01 0700 In: 957.3 [P.O.:500; I.V.:457.3] Out: 1  Intake/Output this shift:    General appearance: cooperative, morbidly obese and slowed mentation Resp: diminished breath sounds bilaterally, rhonchi bilaterally and wheezes bilaterally Cardio: S1, S2 normal and systolic murmur: holosystolic 2/6, blowing at apex GI: obese,pos bs, liver down 5 cm Extremities: edema 1+  Lab Results:  Basename 03/30/12 0500 03/29/12 1702  WBC 13.9* 13.3*  HGB 7.9* 7.8*  HCT 25.2* 24.5*  PLT 196 161   BMET:  Basename 03/30/12 0500 03/29/12 1317  NA 137 133*  K 3.6 4.5  CL 100 95*  CO2 29 26  GLUCOSE 131* 162*  BUN 17 36*  CREATININE 3.00* 4.64*  CALCIUM 8.8 8.7   No results found for this basename: PTH:2 in the last 72 hours Iron Studies: No results found for this basename: IRON,TIBC,TRANSFERRIN,FERRITIN in the last 72 hours  Studies/Results: Ct Abdomen Pelvis Wo Contrast  03/28/2012  *RADIOLOGY REPORT*  Clinical Data:  Right lower quadrant pain shortness of breath.  CT ANGIOGRAPHY CHEST WITH CONTRAST  Technique:  Multidetector CT imaging of the chest was performed using the standard protocol during bolus administration of intravenous contrast.  Multiplanar CT image reconstructions including MIPs were obtained to evaluate the vascular anatomy.  Contrast: OMNIPAQUE IOHEXOL 350 MG/ML SOLN  Comparison:  CTA abdomen pelvis 03/25/2012.  Findings:  Pulmonary arterial opacification is satisfactory.  The  bolus is suboptimal, degrading evaluation for pulmonary emboli in the segmental vessels.  No significant proximal embolus is present to at least the level of the lobe arteries.  The heart size is slightly enlarged.  A small pericardial effusion is present.  Small bilateral pleural effusions are noted.  No significant adenopathy is present.  The bilateral medial lower lobe airspace consolidation is present bilaterally.  Scattered areas of ground-glass attenuation present throughout both lungs.  Linear airspace consolidation is present in the medial left upper lobe.  There is posteromedial consolidation in the superior segment of the left lower lobe and within the upper lobe.  Review of the MIP images confirms the above findings.  IMPRESSION:  1.  No focal filling defects present to suggest a proximal pulmonary embolus.  Evaluation beyond the lobar arteries is severely degraded. 2.  Small bilateral pleural effusions are worse on the right. 3.  Consolidated airspace disease in the posteromedial lower lobes bilaterally. 4.  Mild cardiomegaly and a small pericardial effusion.  *RADIOLOGY REPORT*  Clinical Data:  Retroperitoneal hematoma  CT ABDOMEN AND PELVIS WITHOUT CONTRAST  Technique:  Multidetector CT imaging of the chest, abdomen and pelvis was performed following the standard protocol without IV contrast.  Comparison:   CTA abdomen pelvis 03/25/2012.  CT ABDOMEN AND PELVIS  Findings:  The liver and spleen are within normal limits. Postsurgical changes are again noted at the stomach.  A small hiatal hernia is present.  The duodenum and pancreas are within normal limits.  The common bile duct is within normal limits  following cholecystectomy.  Sub centimeter lymph nodes at the porta hepatis are stable.  A subcapsular hematoma of the right kidney is stable.  The additional retroperitoneal hematoma is stable as well.  There is no evidence for new or expanding hemorrhage.  Hemorrhage extends into the retroperitoneal  tissues of the pelvis.  The left kidney is atrophic.  The adrenal glands are normal bilaterally.  An IVC filter is in place.  The rectosigmoid colon is within normal limits.  The more proximal colon is unremarkable.  Contrast can be seen to the splenic flexure.  The appendix is visualized and normal.  Small bowel is unremarkable.  The peritoneal contents are displaced by the right retroperitoneal hematoma.  The bone windows demonstrate no focal lytic or blastic lesions.  No acute fractures are present.  IMPRESSION:  1.  Stable appearance of right subcapsular renal and retroperitoneal hematoma. 2.  No evidence for new or expanding hemorrhage. 3.  Mass effect with displacement of the intraperitoneal contents. 4.  Atherosclerosis. 5.  Left renal atrophy. 6.  Status post cholecystectomy.   Original Report Authenticated By: Jamesetta Orleans. MATTERN, M.D.    Ct Angio Chest Pe W/cm &/or Wo Cm  03/28/2012  *RADIOLOGY REPORT*  Clinical Data:  Right lower quadrant pain shortness of breath.  CT ANGIOGRAPHY CHEST WITH CONTRAST  Technique:  Multidetector CT imaging of the chest was performed using the standard protocol during bolus administration of intravenous contrast.  Multiplanar CT image reconstructions including MIPs were obtained to evaluate the vascular anatomy.  Contrast: OMNIPAQUE IOHEXOL 350 MG/ML SOLN  Comparison:  CTA abdomen pelvis 03/25/2012.  Findings:  Pulmonary arterial opacification is satisfactory.  The bolus is suboptimal, degrading evaluation for pulmonary emboli in the segmental vessels.  No significant proximal embolus is present to at least the level of the lobe arteries.  The heart size is slightly enlarged.  A small pericardial effusion is present.  Small bilateral pleural effusions are noted.  No significant adenopathy is present.  The bilateral medial lower lobe airspace consolidation is present bilaterally.  Scattered areas of ground-glass attenuation present throughout both lungs.  Linear  airspace consolidation is present in the medial left upper lobe.  There is posteromedial consolidation in the superior segment of the left lower lobe and within the upper lobe.  Review of the MIP images confirms the above findings.  IMPRESSION:  1.  No focal filling defects present to suggest a proximal pulmonary embolus.  Evaluation beyond the lobar arteries is severely degraded. 2.  Small bilateral pleural effusions are worse on the right. 3.  Consolidated airspace disease in the posteromedial lower lobes bilaterally. 4.  Mild cardiomegaly and a small pericardial effusion.  *RADIOLOGY REPORT*  Clinical Data:  Retroperitoneal hematoma  CT ABDOMEN AND PELVIS WITHOUT CONTRAST  Technique:  Multidetector CT imaging of the chest, abdomen and pelvis was performed following the standard protocol without IV contrast.  Comparison:   CTA abdomen pelvis 03/25/2012.  CT ABDOMEN AND PELVIS  Findings:  The liver and spleen are within normal limits. Postsurgical changes are again noted at the stomach.  A small hiatal hernia is present.  The duodenum and pancreas are within normal limits.  The common bile duct is within normal limits following cholecystectomy.  Sub centimeter lymph nodes at the porta hepatis are stable.  A subcapsular hematoma of the right kidney is stable.  The additional retroperitoneal hematoma is stable as well.  There is no evidence for new or expanding hemorrhage.  Hemorrhage extends into  the retroperitoneal tissues of the pelvis.  The left kidney is atrophic.  The adrenal glands are normal bilaterally.  An IVC filter is in place.  The rectosigmoid colon is within normal limits.  The more proximal colon is unremarkable.  Contrast can be seen to the splenic flexure.  The appendix is visualized and normal.  Small bowel is unremarkable.  The peritoneal contents are displaced by the right retroperitoneal hematoma.  The bone windows demonstrate no focal lytic or blastic lesions.  No acute fractures are present.   IMPRESSION:  1.  Stable appearance of right subcapsular renal and retroperitoneal hematoma. 2.  No evidence for new or expanding hemorrhage. 3.  Mass effect with displacement of the intraperitoneal contents. 4.  Atherosclerosis. 5.  Left renal atrophy. 6.  Status post cholecystectomy.   Original Report Authenticated By: Jamesetta Orleans. MATTERN, M.D.    Dg Chest Port 1 View  03/28/2012  *RADIOLOGY REPORT*  Clinical Data: Central line placement.  PORTABLE CHEST - 1 VIEW  Comparison: 03/28/2012  Findings: Left central line is in place with the tip in the central left innominate vein.  No pneumothorax.  Cardiomegaly with vascular congestion.  Bibasilar atelectasis.  The suspect mild interstitial edema.  IMPRESSION: Left central line tip in the central left innominate vein.  No pneumothorax.  Continued bibasilar atelectasis and probable mild interstitial edema.   Original Report Authenticated By: Cyndie Chime, M.D.    Dg Chest Port 1 View  03/28/2012  *RADIOLOGY REPORT*  Clinical Data: Respiratory distress and chest pain.  Rule out pulmonary embolism.  PORTABLE CHEST - 1 VIEW  Comparison: 03/25/2012  Findings: Mildly degraded exam due to AP portable technique and patient body habitus.  The chin overlies the apices.  Moderate cardiomegaly, accentuated by low lung volumes.  Interval removal of right internal jugular line.  No right-sided pleural effusion.  Left pleural space poorly evaluated. No pneumothorax.  Mild interstitial edema, superimposed upon low lung volumes.  Linear opacity in the left apex is favored to represent atelectasis.  There is volume loss in the infrahilar regions bilaterally with patchy bibasilar airspace disease.  IMPRESSION:  1.  Cardiomegaly and mild interstitial edema.  This is superimposed upon low lung volumes. 2.  Volume loss in the infrahilar regions.  Airspace disease at the lung bases which could represent atelectasis. 3.  Multifactorial degradation, including patient body habitus  and low-volume AP portable technique.   Original Report Authenticated By: Consuello Bossier, M.D.     I have reviewed the patient's current medications.  Assessment/Plan: 1 CRF stable 2 anemia stable ? Stable bleed 3 Unexplained hypotension  Cannot attrib this to bleeding at this time or vol. ? Narcotics, ? adrenal, / cardiac 4 Renal bleed Hb stabel 5 Morbid obesity 6 DM P HD in am, check cortisol, f/u bp.    LOS: 5 days   [name] L 03/30/2012,7:36 AM

## 2012-03-31 ENCOUNTER — Inpatient Hospital Stay (HOSPITAL_COMMUNITY): Payer: BC Managed Care – PPO

## 2012-03-31 DIAGNOSIS — K219 Gastro-esophageal reflux disease without esophagitis: Secondary | ICD-10-CM

## 2012-03-31 LAB — GLUCOSE, CAPILLARY: Glucose-Capillary: 119 mg/dL — ABNORMAL HIGH (ref 70–99)

## 2012-03-31 LAB — RENAL FUNCTION PANEL
Albumin: 1.8 g/dL — ABNORMAL LOW (ref 3.5–5.2)
BUN: 28 mg/dL — ABNORMAL HIGH (ref 6–23)
Chloride: 99 mEq/L (ref 96–112)
Creatinine, Ser: 4.1 mg/dL — ABNORMAL HIGH (ref 0.50–1.35)
Glucose, Bld: 132 mg/dL — ABNORMAL HIGH (ref 70–99)
Potassium: 4.1 mEq/L (ref 3.5–5.1)

## 2012-03-31 LAB — HEPATIC FUNCTION PANEL
Albumin: 2 g/dL — ABNORMAL LOW (ref 3.5–5.2)
Alkaline Phosphatase: 83 U/L (ref 39–117)
Total Protein: 6.2 g/dL (ref 6.0–8.3)

## 2012-03-31 LAB — CULTURE, BLOOD (ROUTINE X 2)
Culture: NO GROWTH
Culture: NO GROWTH

## 2012-03-31 LAB — CBC
Platelets: 172 10*3/uL (ref 150–400)
RDW: 19.7 % — ABNORMAL HIGH (ref 11.5–15.5)
WBC: 10.9 10*3/uL — ABNORMAL HIGH (ref 4.0–10.5)

## 2012-03-31 LAB — PROCALCITONIN: Procalcitonin: 1.68 ng/mL

## 2012-03-31 MED ORDER — MIDODRINE HCL 5 MG PO TABS
10.0000 mg | ORAL_TABLET | Freq: Three times a day (TID) | ORAL | Status: DC
  Administered 2012-03-31 – 2012-04-06 (×20): 10 mg via ORAL
  Filled 2012-03-31 (×24): qty 2

## 2012-03-31 NOTE — Procedures (Signed)
I was present at this session.  I have reviewed the session itself and made appropriate changes.Bp low early even with no UF, support with vol and pressors.  [name] L 11/2/201310:11 AM

## 2012-03-31 NOTE — Progress Notes (Signed)
Subjective: Interval History: none.  Objective: Vital signs in last 24 hours: Temp:  [97.4 F (36.3 C)-98.6 F (37 C)] 98.4 F (36.9 C) (11/02 0747) Pulse Rate:  [70-108] 70  (11/02 0700) Resp:  [12-21] 14  (11/02 0700) BP: (72-131)/(40-86) 98/50 mmHg (11/02 0700) SpO2:  [88 %-99 %] 98 % (11/02 0700) Weight change:   Intake/Output from previous day: 11/01 0701 - 11/02 0700 In: 579.1 [P.O.:420; I.V.:159.1] Out: -  Intake/Output this shift:    General appearance: morbidly obese, uncooperative and sedated not awakening Resp: diminished breath sounds bilaterally and rhonchi bilaterally Cardio: S1, S2 normal and systolic murmur: holosystolic 2/6, blowing at apex GI: obese,pos bs,soft Extremities: liver down 6 cm , pos bs, obese  Lab Results:  Basename 03/31/12 0447 03/30/12 0500  WBC 10.9* 13.9*  HGB 7.1* 7.9*  HCT 22.6* 25.2*  PLT 172 196   BMET:  Basename 03/31/12 0447 03/30/12 0500  NA 135 137  K 4.1 3.6  CL 99 100  CO2 30 29  GLUCOSE 132* 131*  BUN 28* 17  CREATININE 4.10* 3.00*  CALCIUM 8.6 8.8   No results found for this basename: PTH:2 in the last 72 hours Iron Studies: No results found for this basename: IRON,TIBC,TRANSFERRIN,FERRITIN in the last 72 hours  Studies/Results: No results found.  I have reviewed the patient's current medications.  Assessment/Plan: 1 CRF for HD keep even 2 Unexplained low BP add midodrine, EF ok. C&S neg, Cortisol ok, CVP ^ 3 Anemia lower , suspect is vol 4 Obesity 5 Renal Hemorrhage seems stable 6Immobility P Add midodrine, epo, HD,     LOS: 6 days   Niccolas Loeper L 03/31/2012,7:56 AM

## 2012-03-31 NOTE — Progress Notes (Signed)
[name] [id number] 2115/2115-01   Pt profile: [name] with ESRD, chronic pain and h/o DVT who presented to the ED 10/27 with RLQ pain, hypotension, anemia due to Large subcapsular and retroperitoneal hematoma involving the right kidney and pararenal tissues (based on CT abd). Received 4 units PRBCs and FFP.  Admitted by PCCM to ICU. BP much improved AM 10/28 and transferred to Renal floor. Returned to ICU on 10/30 for hypotension.   Lines, Tubes, etc: R radial A-line 10/27 >> 10/28 R IJ CVL 10/28 >> 03/27/12 (removed in pm by night float over concern this might be arterial) LIJ CVL 10/30 >>  Microbiology: MRSA PCR 10/27 >> NEG Blood 10/27 >> neg  Antibiotics:  none  Studies/Events: 10/27 CT abdomen/pelvis: Large subcapsular and retroperitoneal hematoma involving the right kidney and pararenal tissues.  03/27/12 - iin 6700. Got HD and PRBC. Rt Ij removed 10/30 >> CT abdomen - Stable appearance of right subcapsular renal and retroperitoneal hematoma CT angio neg for PE Echo 11/1: There is severe RV dysfunction. Also, there is a septal wiggle. This can sometimes suggest constriction. The pericardial effusion is small. The appearnce behind the heart is also unusual. EF 60%  Consults:  CCS (wilson) 10/27: Rec Urology consult. Signed off 10/28 Urology 10/27: recommends repeat CT abdomen in 6 weeks to evaluate the right kidney for a mass and hematoma resolution Renal 10/27: managing HD  Best Practice: DVT: SCDs SUP: PPI Nutrition: renal diet Glycemic control: N/I Sedation/analgesia: PRN morphine  SUBJECTIVE/OVERNIGHT/INTERVAL HX Denies CP, dyspnea Restarted on levophed, but now weaning. No complaints.   Objective BP 120/36  Pulse 79  Temp 98.4 F (36.9 C) (Oral)  Resp 15  Ht 6' (1.829 m)  Wt 155.5 kg (342 lb 13 oz)  BMI 46.49 kg/m2  SpO2 97%  Gen: chronically ill appearing, no distress, morbidly obese  HEENT: WNL Neck: JVP cannot be assessed due to  habitus Chest: Clear anteriorly, coarse BS bl Cardiac: RRR s M Abd: Obese, soft, NT, NABS Ext: warm, trace bilateral ankle edema Buttocks: no wound   IMPRESSION/PLAN:  Heme  Lab 03/31/12 0447 03/30/12 0500 03/29/12 1702 03/28/12 0505 03/27/12 0704 03/25/12 0054  HGB 7.1* 7.9* 7.8* -- -- --  PLT 172 196 161 -- -- --  INR -- -- -- 1.16 1.19 2.16*  1) Large subcapsular and retroperitoneal hematoma involving the right kidney and pararenal tissues - likely due to excessive warfarin and uremic platelet dysfunction. S/p Off warfarin. Received FFP 2) H/O DVT, s/p IVC filter 2010  dopplers 10/30 - non- occlusive DVT involving the right common femoral vein and popliteal vein. Evidence of non- occlusive DVT involving the left posterior tibial vein. Plan: - Too high risk to resume warfarin at any time, certainly no warfarin for 6-8 weeks -has chronic IVC filter Cbc in am -may need additional Tx prbc -clinically not bleeding  C/V  Lab 03/29/12 1917 03/29/12 1317 03/29/12 0830 03/25/12 0054  CKTOTAL -- -- 23 35  CKMB -- -- -- 4.2*  TROPONINI 0.89* 0.83* 0.93* --   1) Hypotension (asymptomatic): still pressor dependant, but weaning off 2) Pos trops - ? Denotes cardiovascular event due to stress 3) RV dysfunction  Plan: Not candidate for heparin or asa 7 day cortisol replacement, unless not off pressors , then would extend Hold antihypertensives Change SBP goal to > 90 or MAP 55 with good MS  Renal added midodrine  With pa htn, preload dep likely, consider bolus therapy , he is on limited  o2  Renal:  Lab 03/31/12 0447 03/30/12 0500 03/29/12 1317  NA 135 137 133*  K 4.1 3.6 4.5  CL 99 100 95*  CO2 30 29 26   BUN 28* 17 36*  CREATININE 4.10* 3.00* 4.64*   ESRD - P: HD per Renal service, on nulecit and aranesp Keep even to pos  Endocrine  Basename 03/31/12 0014 03/30/12 0851 03/29/12 1636  GLUCAP 119* 136* 139*   DM2 -  boarderline adrenal insuff.  Random cort:  19.6 Plan: HG protocol  Cont solucortef (started 11/1)  Pulmonary ABG    Component Value Date/Time   PHART 7.345* 03/26/2012 1930   PCO2ART 46.9* 03/26/2012 1930   PO2ART 57.4* 03/26/2012 1930   HCO3 29.5* 03/27/2012 1830   TCO2 31.0 03/27/2012 1830   ACIDBASEDEF 0.4 03/26/2012 2030   O2SAT 67.9 03/27/2012 1830  Obesity at risk for hypoxia P: pulm hygiene Wean FIo2 Can tolerate volume likely  ID:  Lab 03/31/12 0447 03/30/12 2020 03/30/12 0500 03/29/12 1702 03/28/12 1535 03/25/12 0445 03/25/12 0054  PROCALCITON 1.68 1.87 -- -- -- -- --  WBC 10.9* -- 13.9* 13.3* 11.2* -- --  LATICACIDVEN -- -- -- -- -- 1.9 3.5*   No current evidence of infection P: Monitor   Neuro: No current problem   Global: This is a 46 year old male w/ ESRD, prior DVT w/ chronic IVC placed back in 2010. Presented on 10/27 with RLQ pain, hypotension, anemia due to Large subcapsular and retroperitoneal hematoma involving the right kidney and pararenal tissues (based on CT abd). This was felt to be spontaneous in the setting of coumadin induced coagulopathy. INR was reversed, he was transfused & he clinically improved to point he could be moved to the SDU. He returned on 10/10 w/ asymptomatic hypotension (SBP in 80s) requiring pressor support. He has since received more PRBCs, was started on solucortef for boarderline adrenal insuff., and renal has started on midodrine. His current SBP is 110 on low dose levophed. He is tolerating HD. His hgb is holding. We will change his BP goal for SBP >90, cont 7d total solucortef and cont to monitor in ICU. Hope we can get him out of the ICU in next 24-48 hrs if he comes off levophed.   Ccm tmie 30 min   Mcarthur Rossetti. Tyson Alias, MD, FACP Pgr: [phone] [facility]

## 2012-04-01 LAB — CBC
HCT: 21.4 % — ABNORMAL LOW (ref 39.0–52.0)
Hemoglobin: 6.8 g/dL — CL (ref 13.0–17.0)
MCHC: 31.8 g/dL (ref 30.0–36.0)
RBC: 2.13 MIL/uL — ABNORMAL LOW (ref 4.22–5.81)
WBC: 9.8 10*3/uL (ref 4.0–10.5)

## 2012-04-01 LAB — RENAL FUNCTION PANEL
CO2: 31 mEq/L (ref 19–32)
CO2: 29 mEq/L (ref 19–32)
Calcium: 8.5 mg/dL (ref 8.4–10.5)
Chloride: 99 mEq/L (ref 96–112)
Chloride: 99 mEq/L (ref 96–112)
GFR calc Af Amer: 35 mL/min — ABNORMAL LOW (ref >90)
GFR calc Af Amer: 27 mL/min — ABNORMAL LOW (ref >90)
GFR calc non Af Amer: 30 mL/min — ABNORMAL LOW (ref >90)
GFR calc non Af Amer: 23 mL/min — ABNORMAL LOW (ref >90)
Glucose, Bld: 142 mg/dL — ABNORMAL HIGH (ref 70–99)
Glucose, Bld: 146 mg/dL — ABNORMAL HIGH (ref 70–99)
Potassium: 3.6 mEq/L (ref 3.5–5.1)
Potassium: 3.5 mEq/L (ref 3.5–5.1)
Sodium: 134 mEq/L — ABNORMAL LOW (ref 135–145)
Sodium: 135 mEq/L (ref 135–145)

## 2012-04-01 LAB — PROCALCITONIN: Procalcitonin: 1.23 ng/mL

## 2012-04-01 NOTE — Progress Notes (Signed)
[name] [id number] 2115/2115-01   Pt profile: [name] with ESRD, chronic pain and h/o DVT who presented to the ED 10/27 with RLQ pain, hypotension, anemia due to Large subcapsular and retroperitoneal hematoma involving the right kidney and pararenal tissues (based on CT abd). Received 4 units PRBCs and FFP.  Admitted by PCCM to ICU. BP much improved AM 10/28 and transferred to Renal floor. Returned to ICU on 10/30 for hypotension.   Lines, Tubes, etc: R radial A-line 10/27 >> 10/28 R IJ CVL 10/28 >> 03/27/12 (removed in pm by night float over concern this might be arterial) LIJ CVL 10/30 >>  Microbiology: MRSA PCR 10/27 >> NEG Blood 10/27 >> neg  Antibiotics:  none  Studies/Events: 10/27 CT abdomen/pelvis: Large subcapsular and retroperitoneal hematoma involving the right kidney and pararenal tissues.  03/27/12 - iin 6700. Got HD and PRBC. Rt Ij removed 10/30 >> CT abdomen - Stable appearance of right subcapsular renal and retroperitoneal hematoma CT angio neg for PE Echo 11/1: There is severe RV dysfunction. Also, there is a septal wiggle. This can sometimes suggest constriction. The pericardial effusion is small. The appearnce behind the heart is also unusual. EF 60%  Consults:  CCS (wilson) 10/27: Rec Urology consult. Signed off 10/28 Urology 10/27: recommends repeat CT abdomen in 6 weeks to evaluate the right kidney for a mass and hematoma resolution Renal 10/27: managing HD  Best Practice: DVT: SCDs SUP: PPI Nutrition: renal diet Glycemic control: N/I Sedation/analgesia: PRN morphine  SUBJECTIVE/OVERNIGHT/INTERVAL HX Off pressors finally!!   Objective BP 92/62  Pulse 65  Temp 97.5 F (36.4 C) (Oral)  Resp 17  Ht 6' (1.829 m)  Wt 165.8 kg (365 lb 8.4 oz)  BMI 49.57 kg/m2  SpO2 97%  Gen: chronically ill appearing, no distress, morbidly obese  HEENT: WNL Neck: JVP low likely Chest: Clear anteriorly, left mild coarse Cardiac: RRR s M Abd: Obese, soft,  NT, NABS, no new abdo pain Ext: warm, trace bilateral ankle edema Buttocks: no wound   IMPRESSION/PLAN:  Heme  Lab 04/01/12 0200 03/31/12 0447 03/30/12 0500 03/28/12 0505 03/27/12 0704  HGB 6.8* 7.1* 7.9* -- --  PLT 172 172 196 -- --  INR -- -- -- 1.16 1.19  1) Large subcapsular and retroperitoneal hematoma involving the right kidney and pararenal tissues - likely due to excessive warfarin and uremic platelet dysfunction. S/p Off warfarin. Received FFP 2) H/O DVT, s/p IVC filter 2010 3 anemia  dopplers 10/30 - non- occlusive DVT involving the right common femoral vein and popliteal vein. Evidence of non- occlusive DVT involving the left posterior tibial vein. Plan: - No coum -has chronic IVC filter Cbc in am -s/p 1 unit prbc, agree -clinically not bleeding, may need repeat CT  C/V  Lab 03/29/12 1917 03/29/12 1317 03/29/12 0830  CKTOTAL -- -- 23  CKMB -- -- --  TROPONINI 0.89* 0.83* 0.93*   1) Hypotension (asymptomatic): still pressor dependant, but weaning off 2) Pos trops - ? Denotes cardiovascular event due to stress 3) RV dysfunction  Plan: Not candidate for heparin or asa 7 day cortisol replacement remains plan added midodrine  May have benefit from pos balance, now off pressors  Renal:  Lab 04/01/12 1323 04/01/12 0200 03/31/12 0447  NA 135 134* 135  K 3.5 3.6 4.1  CL 99 99 99  CO2 29 31 30   BUN 24* 17 28*  CREATININE 3.05* 2.48* 4.10*   ESRD - P: HD per Renal service, on nulecit and  aranesp Keep even to pos  Endocrine  Basename 03/31/12 0014 03/30/12 0851  GLUCAP 119* 136*   DM2 -  boarderline adrenal insuff.  Random cort: 19.6 Plan: HG protocol  Cont solucortef (started 11/1), remain  Pulmonary ABG    Component Value Date/Time   PHART 7.345* 03/26/2012 1930   PCO2ART 46.9* 03/26/2012 1930   PO2ART 57.4* 03/26/2012 1930   HCO3 29.5* 03/27/2012 1830   TCO2 31.0 03/27/2012 1830   ACIDBASEDEF 0.4 03/26/2012 2030   O2SAT 67.9 03/27/2012  1830  Obesity at risk for hypoxia P: pulm hygiene Wean FIo2 Can tolerate volume likely, no edema after pos balance  ID:  Lab 04/01/12 0200 03/31/12 0447 03/30/12 2020 03/30/12 0500 03/29/12 1702  PROCALCITON 1.23 1.68 1.87 -- --  WBC 9.8 10.9* -- 13.9* 13.3*  LATICACIDVEN -- -- -- -- --   No current evidence of infection P: Monitor   Neuro: No current problem   Global: off presors, may have liked fluid pos balance. Cbc after 1 unit further  Mcarthur Rossetti. Tyson Alias, MD, FACP Pgr: [phone]  [facility]

## 2012-04-01 NOTE — Progress Notes (Signed)
Subjective: Interval History: has complaints steroids getting to him.  Objective: Vital signs in last 24 hours: Temp:  [97.7 F (36.5 C)-98.6 F (37 C)] 97.7 F (36.5 C) (11/03 0400) Pulse Rate:  [60-119] 60  (11/03 0700) Resp:  [12-20] 13  (11/03 0700) BP: (71-129)/(21-88) 90/41 mmHg (11/03 0700) SpO2:  [75 %-100 %] 100 % (11/03 0820) Weight:  [56.4 kg (124 lb 5.4 oz)-165.8 kg (365 lb 8.4 oz)] 165.8 kg (365 lb 8.4 oz) (11/03 0341) Weight change:   Intake/Output from previous day: 11/02 0701 - 11/03 0700 In: 250 [P.O.:112; I.V.:138] Out: 0  Intake/Output this shift:    General appearance: alert, cooperative, morbidly obese and pale Resp: diminished breath sounds bilaterally, rhonchi bilaterally and wheezes bilaterally Cardio: S1, S2 normal and systolic murmur: holosystolic 2/6, blowing at apex GI: obese,pos bs,liver 5 cm down Extremities: edema 2+  Lab Results:  Basename 04/01/12 0200 03/31/12 0447  WBC 9.8 10.9*  HGB 6.8* 7.1*  HCT 21.4* 22.6*  PLT 172 172   BMET:  Basename 04/01/12 0200 03/31/12 0447  NA 134* 135  K 3.6 4.1  CL 99 99  CO2 31 30  GLUCOSE 142* 132*  BUN 17 28*  CREATININE 2.48* 4.10*  CALCIUM 8.2* 8.6   No results found for this basename: PTH:2 in the last 72 hours Iron Studies: No results found for this basename: IRON,TIBC,TRANSFERRIN,FERRITIN in the last 72 hours  Studies/Results: No results found.  I have reviewed the patient's current medications.  Assessment/Plan: 1 CRF stable 2 Renal bleed hb lower, tx, follow 3 Anemia max epo,fe 4 Morbid obesity 5Hx DVT 6 OSA 7 Unexplained hypotension of NE, on mido P tx, check K after, mido    LOS: 7 days   Jenella Craigie L 04/01/2012,8:26 AM

## 2012-04-01 NOTE — Progress Notes (Signed)
At 0650, patient requesting oxycodone, informed patient of oxycodone schedule.  Patient states "i think the steroid ya'll are giving me is making me confused."  Patient noted to be neurologically intact  With GCS of 15.  Patient informed me he's felt confused for past couple of days but has not told anyone. Educated patient about need for steroid and his current stable vitals.  Also, Informed patient that I will share this information at handoff report, and I would also share that he did not sleep all night.  During the night patient noted to be watching tv, using his computer, watching movies on his computer.  Patient complains of worsening chronic back pain, requests morphine.  Explained to patient that morphine could also cause some confusion, patient states "I'm not that confused, I can have my morphine."  CAM-ICU negative. Morphine administered as per orders.  Patient's sister called, would like a doctor to update on brother's current status.  Reported to sister that brother's vital signs were stable at this time.  Continued with present management and care.

## 2012-04-02 LAB — COMPREHENSIVE METABOLIC PANEL
ALT: 9 U/L (ref 0–53)
BUN: 33 mg/dL — ABNORMAL HIGH (ref 6–23)
Calcium: 8.6 mg/dL (ref 8.4–10.5)
Creatinine, Ser: 3.69 mg/dL — ABNORMAL HIGH (ref 0.50–1.35)
GFR calc Af Amer: 22 mL/min — ABNORMAL LOW (ref >90)
GFR calc non Af Amer: 19 mL/min — ABNORMAL LOW (ref >90)
Glucose, Bld: 131 mg/dL — ABNORMAL HIGH (ref 70–99)
Sodium: 136 mEq/L (ref 135–145)
Total Protein: 5.5 g/dL — ABNORMAL LOW (ref 6.0–8.3)

## 2012-04-02 LAB — TYPE AND SCREEN
ABO/RH(D): O NEG
Unit division: 0

## 2012-04-02 LAB — CBC
HCT: 23.1 % — ABNORMAL LOW (ref 39.0–52.0)
MCH: 31.6 pg (ref 26.0–34.0)
MCHC: 32 g/dL (ref 30.0–36.0)
RDW: 19.2 % — ABNORMAL HIGH (ref 11.5–15.5)

## 2012-04-02 LAB — GLUCOSE, CAPILLARY
Glucose-Capillary: 108 mg/dL — ABNORMAL HIGH (ref 70–99)
Glucose-Capillary: 143 mg/dL — ABNORMAL HIGH (ref 70–99)

## 2012-04-02 MED ORDER — SODIUM CHLORIDE 0.9 % IJ SOLN
10.0000 mL | Freq: Two times a day (BID) | INTRAMUSCULAR | Status: DC
  Administered 2012-04-03: 10 mL

## 2012-04-02 MED ORDER — ALBUTEROL SULFATE (5 MG/ML) 0.5% IN NEBU
INHALATION_SOLUTION | RESPIRATORY_TRACT | Status: AC
  Filled 2012-04-02: qty 0.5

## 2012-04-02 MED ORDER — PREDNISONE 10 MG PO TABS: 10.0000 mg | ORAL_TABLET | Freq: Every day | ORAL | Status: DC

## 2012-04-02 MED ORDER — PREDNISONE 20 MG PO TABS
40.0000 mg | ORAL_TABLET | Freq: Every day | ORAL | Status: AC
  Administered 2012-04-02 – 2012-04-03 (×2): 40 mg via ORAL
  Filled 2012-04-02 (×2): qty 2

## 2012-04-02 MED ORDER — PREDNISONE 20 MG PO TABS: 20.0000 mg | ORAL_TABLET | Freq: Every day | ORAL | Status: DC

## 2012-04-02 MED ORDER — PREDNISONE 20 MG PO TABS
20.0000 mg | ORAL_TABLET | Freq: Every day | ORAL | Status: AC
  Administered 2012-04-04 – 2012-04-05 (×2): 20 mg via ORAL
  Filled 2012-04-02 (×2): qty 1

## 2012-04-02 MED ORDER — PREDNISONE 20 MG PO TABS
40.0000 mg | ORAL_TABLET | Freq: Every day | ORAL | Status: DC
  Filled 2012-04-02: qty 2

## 2012-04-02 MED ORDER — PREDNISONE 10 MG PO TABS
10.0000 mg | ORAL_TABLET | Freq: Every day | ORAL | Status: DC
  Administered 2012-04-06: 10 mg via ORAL
  Filled 2012-04-02 (×2): qty 1

## 2012-04-02 MED ORDER — SODIUM CHLORIDE 0.9 % IJ SOLN
10.0000 mL | INTRAMUSCULAR | Status: DC | PRN
  Administered 2012-04-02 – 2012-04-03 (×5): 10 mL

## 2012-04-02 NOTE — Progress Notes (Addendum)
Nutrition Follow-up  Intervention:  Continue current interventions; pt continues to decline supplements. Agreeable to [place] sandwich at HS. RD to add.  Assessment:   CT of abdomen showed large subcapsular and retroperitoneal hematoma involving the right kidney and pararenal tissues. Transferred to ICU 2/2 hypotension. Per most recent CCM note, pt is finally off pressors today, like tx out of ICU soon.  Intake of meals has greatly improved, consuming at least 90% of meals.  Potassium and phosphorus WNL.  Diet Order:  Renal 80 - 90  Meds: Scheduled Meds:   . albuterol  2.5 mg Nebulization Q4H  . albuterol      . darbepoetin (ARANESP) injection - DIALYSIS  200 mcg Intravenous Q Tue-HD  . escitalopram  20 mg Oral Daily  . ferric gluconate (FERRLECIT/NULECIT) IV  125 mg Intravenous Q T,Th,Sa-HD  . hydrocortisone sod succinate (SOLU-CORTEF) injection  50 mg Intravenous Q8H  . midodrine  10 mg Oral TID WC  . multivitamin  1 tablet Oral QHS  . OxyCODONE  60 mg Oral Q12H  . pantoprazole  40 mg Oral Q1200   Continuous Infusions:   . norepinephrine (LEVOPHED) Adult infusion Stopped (03/31/12 1700)   PRN Meds:.sodium chloride, ALPRAZolam, feeding supplement (NEPRO CARB STEADY), feeding supplement (NEPRO CARB STEADY), heparin, heparin, lidocaine, lidocaine, lidocaine-prilocaine, lidocaine-prilocaine, morphine injection, ondansetron (ZOFRAN) IV, pentafluoroprop-tetrafluoroeth, pentafluoroprop-tetrafluoroeth   CMP     Component Value Date/Time   NA 136 04/02/2012 0500   K 3.7 04/02/2012 0500   CL 100 04/02/2012 0500   CO2 29 04/02/2012 0500   GLUCOSE 131* 04/02/2012 0500   BUN 33* 04/02/2012 0500   CREATININE 3.69* 04/02/2012 0500   CALCIUM 8.6 04/02/2012 0500   PROT 5.5* 04/02/2012 0500   ALBUMIN 1.8* 04/02/2012 0500   AST 10 04/02/2012 0500   ALT 9 04/02/2012 0500   ALKPHOS 72 04/02/2012 0500   BILITOT 0.3 04/02/2012 0500   GFRNONAA 19* 04/02/2012 0500   GFRAA 22* 04/02/2012 0500   Phosphorus   Date/Time Value Range Status  04/02/2012  5:00 AM 3.7  2.3 - 4.6 mg/dL Final    CBG (last 3)   Basename 04/02/12 0831 03/31/12 0014  GLUCAP 108* 119*     Intake/Output Summary (Last 24 hours) at 04/02/12 1129 Last data filed at 04/02/12 1037  Gross per 24 hour  Intake    655 ml  Output      0 ml  Net    655 ml  BM 10/26  Weight Status:  344 lb/156.4 kg s/p HD 11/4 - wt up with significant positive fluid balance 327 lb/149 kg s/p HD 10/28  Estimated needs:  2200 - 2500 kcal, 120 - 140 grams protein, 1.2 liters daily  Nutrition Dx:  Inadequate oral intake r/t GI distress and poor appetite AEB pt report and dietary recall. Resolved.  New Nutrition Dx: Increased nutrient needs r/t HD AEB estimated needs.  Goal:  Pt to meet >/= 90% of their estimated nutrition needs - unmet.  Monitor:  weight trends, lab trends, I/O's, PO intake  Jarold Motto MS, [place], [place] Pager: [phone] After-hours pager: [phone]

## 2012-04-02 NOTE — Evaluation (Addendum)
Physical Therapy Evaluation Patient Details Name: [name] MRN: [id number] DOB: 05/01/1966 Today's Date: 04/02/2012 Time: 1191-4782 PT Time Calculation (min): 64 min  PT Assessment / Plan / Recommendation Clinical Impression  pt adm with N/V weakness and  anemia due to Large subcapsular and retroperitoneal hematoma involving the right kidney and pararenal tissues (based on CT abd)..  Pt could benefit from PT to maximize functional mobility as pt has become quite debilitated.  Will see on acute.    PT Assessment  Patient needs continued PT services    Follow Up Recommendations  Home health PT;Supervision - Intermittent    Does the patient have the potential to tolerate intense rehabilitation      Barriers to Discharge        Equipment Recommendations  None recommended by OT    Recommendations for Other Services     Frequency Min 3X/week    Precautions / Restrictions Precautions Precautions: Fall Precaution Comments: morbid obesity, foot drop in the LLE, history of left femur fracture in 3/13 Restrictions Weight Bearing Restrictions: No LLE Weight Bearing: Weight bearing as tolerated   Pertinent Vitals/Pain BP in supine 117/101, in sitting 120/57, SaO2 94% on RA,      Mobility  Bed Mobility Bed Mobility: Supine to Sit Supine to Sit: 3: Mod assist;HOB elevated Sitting - Scoot to Edge of Bed: 4: Min assist Sit to Supine: 4: Min assist;HOB flat Sit to Supine: Patient Percentage: 50% Details for Bed Mobility Assistance: Required assistance with transitioning from supine to sit by pulling on therapist's hand.  Needed assistance with lifting LLE in the bed when returning to supine. Transfers Transfers: Sit to Stand;Stand to Sit;Lateral/Scoot Transfers Sit to Stand: From bed;Without upper extremity assist;1: +2 Total assist Sit to Stand: Patient Percentage: 30% Stand to Sit: 1: +2 Total assist;With upper extremity assist;To bed Stand to Sit: Patient Percentage:  30% Lateral/Scoot Transfers: 1: +2 Total assist;From elevated surface Lateral Transfers: Patient Percentage: 50% Details for Transfer Assistance: vc's for hand placement; assist to translate weight forward over his BOS Ambulation/Gait Ambulation/Gait Assistance: Not tested (comment) Wheelchair Mobility Wheelchair Mobility: No    Shoulder Instructions     Exercises     PT Diagnosis: Generalized weakness;Acute pain  PT Problem List: Decreased strength;Decreased activity tolerance;Decreased range of motion;Decreased mobility;Decreased coordination;Decreased knowledge of use of DME;Impaired sensation;Obesity;Pain PT Treatment Interventions: DME instruction;Functional mobility training;Therapeutic activities;Patient/family education;Other (comment) (pregait)   PT Goals Acute Rehab PT Goals PT Goal Formulation: With patient Time For Goal Achievement: 04/16/12 Potential to Achieve Goals: Good Pt will go Supine/Side to Sit: with supervision;with HOB not 0 degrees (comment degree) PT Goal: Supine/Side to Sit - Progress: Goal set today Pt will go Sit to Stand: with mod assist PT Goal: Sit to Stand - Progress: Goal set today Pt will Transfer Bed to Chair/Chair to Bed: with min assist;with mod assist;Other (comment) (min with SB and mod with pivot) PT Transfer Goal: Bed to Chair/Chair to Bed - Progress: Goal set today Additional Goals Additional Goal #1: pregait activity at EOB with mod assist PT Goal: Additional Goal #1 - Progress: Goal set today  Visit Information  Last PT Received On: 04/02/12 Assistance Needed: +2 PT/OT Co-Evaluation/Treatment: Yes    Subjective Data  Subjective: I stood up for a minute about 3 weeks ago.  The doc said I could stand with as much weight as possible, but not walk yet Patient Stated Goal: Independent at least in the chair   Prior Functioning  Home Living Lives With:  Spouse Available Help at Discharge: Available 24 hours/day Type of Home: House Home  Access: Ramped entrance Home Layout: Multi-level;Able to live on main level with bedroom/bathroom Home Adaptive Equipment: Wheelchair - powered;Other (comment);Bedside commode/3-in-1;Walker - rolling Additional Comments: sliding board transfer to electric wheelchair Prior Function Level of Independence: Needs assistance Needs Assistance: Bathing;Dressing;Toileting Bath: Supervision/set-up Dressing: Supervision/set-up Toileting: Supervision/set-up Able to Take Stairs?: No Driving: Yes Vocation: Full time employment Communication Communication: No difficulties Dominant Hand: Right    Cognition  Overall Cognitive Status: Appears within functional limits for tasks assessed/performed Arousal/Alertness: Awake/alert Orientation Level: Appears intact for tasks assessed Behavior During Session: [facility] for tasks performed    Extremity/Trunk Assessment Right Upper Extremity Assessment RUE ROM/Strength/Tone: Deficits RUE ROM/Strength/Tone Deficits: Overall shoulder strength 4/5, all other joints 4+/5 RUE Sensation: WFL - Light Touch RUE Coordination: WFL - gross/fine motor Left Upper Extremity Assessment LUE ROM/Strength/Tone: Deficits LUE ROM/Strength/Tone Deficits: Overall shoulder strength 4/5, all other joints 4+/5 LUE Sensation: WFL - Light Touch LUE Coordination: WFL - gross/fine motor Right Lower Extremity Assessment RLE ROM/Strength/Tone: WFL for tasks assessed;Deficits RLE ROM/Strength/Tone Deficits: quads 4/5, hams ~4/5, hip flexors 2+/5,  df/pf 3/5 RLE Sensation: WFL - Light Touch RLE Coordination: WFL - gross motor Left Lower Extremity Assessment LLE ROM/Strength/Tone: Deficits LLE ROM/Strength/Tone Deficits: significantly weak at 3/5 quads, 2+/5 hams, >=2/5 hip flexors, foot drop pf 1-2/5 LLE Sensation: Deficits LLE Sensation Deficits: absent LT, diminished to pressure Trunk Assessment Trunk Assessment: Other exceptions Trunk Exceptions: decreased lumbar extension in  standing   Balance Balance Balance Assessed: Yes Static Sitting Balance Static Sitting - Balance Support: Feet supported;No upper extremity supported Static Sitting - Level of Assistance: 5: Stand by assistance Static Standing Balance Static Standing - Balance Support: No upper extremity supported Static Standing - Level of Assistance: 5: Stand by assistance  End of Session PT - End of Session Equipment Utilized During Treatment: Gait belt Activity Tolerance: Patient tolerated treatment well Patient left: in bed;with call bell/phone within reach Nurse Communication: Mobility status  GP     Malayja Freund, Eliseo Gum 04/02/2012, 5:28 PM  04/02/2012  [place], PT [phone] (pager)

## 2012-04-02 NOTE — Evaluation (Signed)
Occupational Therapy Evaluation Patient Details Name: [name] MRN: [id number] DOB: Oct 04, 1965 Today's Date: 04/02/2012 Time: 8469-6295 OT Time Calculation (min): 64 min  OT Assessment / Plan / Recommendation Clinical Impression  Pleasant 46 yr old man admitted with anemia due to Large subcapsular and retroperitoneal hematoma involving the right kidney and pararenal tissues.  Now presents with overall weakness and decreased independence with basic selfcare tasks.  Will benefit from acute OT services to increase strength and independence for return home with wife and 24 hour supervision.  Pt has all OT related DME and requests only PT home health services at discharge.      OT Assessment  Patient needs continued OT Services    Follow Up Recommendations  No OT follow up    Barriers to Discharge None    Equipment Recommendations  None recommended by OT       Frequency  Min 2X/week    Precautions / Restrictions Precautions Precautions: Fall Precaution Comments: morbid obesity, foot drop in the LLE, history of left femur fracture in 3/13 Restrictions Weight Bearing Restrictions: No LLE Weight Bearing: Weight bearing as tolerated   Pertinent Vitals/Pain O2 sats 90 % on room air, BP supine 117/101, in sitting 120/51    ADL  Eating/Feeding: Simulated;Independent Where Assessed - Eating/Feeding: Edge of bed Grooming: Simulated;Set up Where Assessed - Grooming: Unsupported sitting Upper Body Bathing: Simulated;Set up Where Assessed - Upper Body Bathing: Unsupported sitting Lower Body Bathing: Simulated;Minimal assistance Where Assessed - Lower Body Bathing: Supine, head of bed up;Rolling right and/or left Upper Body Dressing: Simulated;Set up Where Assessed - Upper Body Dressing: Unsupported sitting Lower Body Dressing: Performed;Moderate assistance Where Assessed - Lower Body Dressing: Supine, head of bed up;Rolling right and/or left Toilet Transfer: Other (comment) (not  performed pt uses bedpan currently) Toilet Transfer Equipment: Raised toilet seat with arms (or 3-in-1 over toilet) Toileting - Clothing Manipulation and Hygiene: Moderate assistance Where Assessed - Toileting Clothing Manipulation and Hygiene: Supine, head of bed flat;Rolling right and/or left Tub/Shower Transfer Method: Not assessed Equipment Used: Rolling walker;Gait belt Transfers/Ambulation Related to ADLs: Pt did not transfer but did perform sit to stand X 2 for 20 second intervals and total +2(pt 30 %) with use of wide RW for support.  Unable to achieve full hip or lumbar extension with standing. ADL Comments: Pt reports performing sponge baths and dressing at bed level.  Has bed at home that raises up and down.  Able to cross his LEs up to donn his sock with just min assist.  Performs rolling in bed to position bed pan and perform toileting tasks.  Has not been able to use his wide bedside toilet yet.  Is interested in performing UE strengthening exercises and working on selfcare tasks while in hospital but does not want inpatient rehab or HHOT.    OT Diagnosis: Generalized weakness;Acute pain  OT Problem List: Decreased strength;Decreased activity tolerance;Impaired balance (sitting and/or standing);Pain;Obesity;Cardiopulmonary status limiting activity OT Treatment Interventions: Self-care/ADL training;Therapeutic activities;DME and/or AE instruction;Patient/family education;Balance training;Therapeutic exercise   OT Goals Acute Rehab OT Goals OT Goal Formulation: With patient Time For Goal Achievement: 04/16/12 Potential to Achieve Goals: Good ADL Goals Pt Will Perform Lower Body Bathing: with set-up;Unsupported;Supine, rolling right and/or left;Supine, head of bed up ADL Goal: Lower Body Bathing - Progress: Goal set today Pt Will Perform Lower Body Dressing: with set-up;Supine, head of bed up;Supine, rolling right and/or left ADL Goal: Lower Body Dressing - Progress: Goal set  today Miscellaneous OT Goals Miscellaneous  OT Goal #1: Pt will perform bilateral UE therex with supervsion in order to increase shoulder strength and endurance for functional transfers and selfcare. OT Goal: Miscellaneous Goal #1 - Progress: Goal set today Miscellaneous OT Goal #2: Pt will perform toilet transfer to bed pan with setup at bed level. OT Goal: Miscellaneous Goal #2 - Progress: Goal set today Miscellaneous OT Goal #3: Pt will transition supine to sit EOB in preparation for selfcare tasks with supervision. OT Goal: Miscellaneous Goal #3 - Progress: Goal set today  Visit Information  Last OT Received On: 04/02/12 Assistance Needed: +2    Subjective Data  Subjective: "I still haven't been standing, my medical issues have affected my ability to complete my PT at home." Patient Stated Goal: Wants to get home and do his rehab (PT) there.   Prior Functioning     Home Living Lives With: Spouse Available Help at Discharge: Available 24 hours/day Type of Home: House Home Access: Ramped entrance Home Layout: Multi-level;Able to live on main level with bedroom/bathroom Home Adaptive Equipment: Wheelchair - powered;Other (comment);Bedside commode/3-in-1;Walker - rolling Additional Comments: sliding board transfer to electric wheelchair Prior Function Level of Independence: Needs assistance Needs Assistance: Bathing;Dressing;Toileting Bath: Supervision/set-up Dressing: Supervision/set-up Toileting: Supervision/set-up Able to Take Stairs?: No Driving: Yes Vocation: Full time employment Communication Communication: No difficulties Dominant Hand: Right         Vision/Perception Vision - Assessment Eye Alignment: Within Functional Limits Vision Assessment: Vision not tested Perception Perception: Within Functional Limits Praxis Praxis: Intact   Cognition  Overall Cognitive Status: Appears within functional limits for tasks assessed/performed Arousal/Alertness:  Awake/alert Orientation Level: Appears intact for tasks assessed Behavior During Session: [facility] for tasks performed    Extremity/Trunk Assessment Right Upper Extremity Assessment RUE ROM/Strength/Tone: Deficits RUE ROM/Strength/Tone Deficits: Overall shoulder strength 4/5, all other joints 4+/5 RUE Sensation: WFL - Light Touch RUE Coordination: WFL - gross/fine motor Left Upper Extremity Assessment LUE ROM/Strength/Tone: Deficits LUE ROM/Strength/Tone Deficits: Overall shoulder strength 4/5, all other joints 4+/5 LUE Sensation: WFL - Light Touch LUE Coordination: WFL - gross/fine motor Right Lower Extremity Assessment RLE ROM/Strength/Tone: WFL for tasks assessed;Deficits RLE ROM/Strength/Tone Deficits: quads 4/5, hams ~4/5, hip flexors 2+/5,  df/pf 3/5 RLE Sensation: WFL - Light Touch RLE Coordination: WFL - gross motor Left Lower Extremity Assessment LLE ROM/Strength/Tone: Deficits LLE ROM/Strength/Tone Deficits: significantly weak at 3/5 quads, 2+/5 hams, >=2/5 hip flexors, foot drop pf 1-2/5 LLE Sensation: Deficits LLE Sensation Deficits: absent LT, diminished to pressure Trunk Assessment Trunk Assessment: Other exceptions Trunk Exceptions: decreased lumbar extension in standing     Mobility Bed Mobility Bed Mobility: Supine to Sit Supine to Sit: 3: Mod assist;HOB elevated Sitting - Scoot to Edge of Bed: 4: Min assist Sit to Supine: 4: Min assist;HOB flat Sit to Supine: Patient Percentage: 50% Details for Bed Mobility Assistance: Required assistance with transitioning from supine to sit by pulling on therapist's hand.  Needed assistance with lifting LLE in the bed when returning to supine. Transfers Transfers: Sit to Stand Sit to Stand: From bed;Without upper extremity assist;1: +2 Total assist Sit to Stand: Patient Percentage: 30% Stand to Sit: 1: +2 Total assist;With upper extremity assist;To bed Stand to Sit: Patient Percentage: 30% Details for Transfer Assistance: vc's  for hand placement; assist to translate weight forward over his BOS           Balance Balance Balance Assessed: Yes Static Sitting Balance Static Sitting - Balance Support: Feet supported;No upper extremity supported Static Sitting - Level of  Assistance: 5: Stand by Theatre manager Standing - Balance Support: No upper extremity supported Static Standing - Level of Assistance: 5: Stand by assistance   End of Session OT - End of Session Equipment Utilized During Treatment: Gait belt Activity Tolerance: Patient limited by pain;Patient limited by fatigue Patient left: in bed;with call bell/phone within reach;with nursing in room Nurse Communication: Mobility status     [name] OTR/L  04/02/2012, 4:53 PM

## 2012-04-02 NOTE — Progress Notes (Signed)
[name] [id number]/[id number]   Pt profile: [name] with ESRD, chronic pain and h/o DVT who presented to the ED 10/27 with RLQ pain, hypotension, anemia due to Large subcapsular and retroperitoneal hematoma involving the right kidney and pararenal tissues (based on CT abd). Received 4 units PRBCs and FFP.  Admitted by PCCM to ICU. BP much improved AM 10/28 and transferred to Renal floor. Returned to ICU on 10/30 for hypotension. Off pressors on 11/3, but with persistent anemia.   Lines, Tubes, etc: R radial A-line 10/27 >> 10/28 R IJ CVL 10/28 >> 03/27/12 (removed in pm by night float over concern this might be arterial) LIJ CVL 10/30 >>  Microbiology: MRSA PCR 10/27 >> NEG Blood 10/27 >> neg  Antibiotics:  none  Studies/Events: 10/27 CT abdomen/pelvis: Large subcapsular and retroperitoneal hematoma involving the right kidney and pararenal tissues.  03/27/12 - iin 6700. Got HD and PRBC. Rt Ij removed 10/30 >> CT abdomen - Stable appearance of right subcapsular renal and retroperitoneal hematoma CT angio neg for PE Echo 11/1: There is severe RV dysfunction. Also, there is a septal wiggle. This can sometimes suggest constriction. The pericardial effusion is small. The appearnce behind the heart is also unusual. EF 60%  Consults:  CCS (wilson) 10/27: Rec Urology consult. Signed off 10/28 Urology 10/27: recommends repeat CT abdomen in 6 weeks to evaluate the right kidney for a mass and hematoma resolution Renal 10/27: managing HD  Best Practice: DVT: SCDs SUP: PPI Nutrition: renal diet Glycemic control: N/I Sedation/analgesia: PRN morphine  SUBJECTIVE/OVERNIGHT/INTERVAL HX Remains off pressors  Objective Temp:  [97.5 F (36.4 C)-98 F (36.7 C)] 97.5 F (36.4 C) (11/04 0400) Pulse Rate:  [56-114] 56  (11/04 1000) Resp:  [11-23] 13  (11/04 1000) BP: (77-130)/(28-104) 93/48 mmHg (11/04 1000) SpO2:  [77 %-100 %] 94 % (11/04 1100) Weight:  [344 lb 12.8 oz (156.4  kg)] 344 lb 12.8 oz (156.4 kg) (11/04 0600)  Gen: chronically ill appearing, no distress, morbidly obese  HEENT: WNL Neck: JVP low likely Chest: Clear anteriorly, left mild coarse Cardiac: RRR s M Abd: Obese, soft, NT, NABS, no new abdo pain Ext: warm, trace bilateral ankle edema Buttocks: no wound   IMPRESSION/PLAN:  Heme  Lab 04/02/12 0500 04/01/12 0200 03/31/12 0447 03/28/12 0505 03/27/12 0704  HGB 7.4* 6.8* 7.1* -- --  PLT 202 172 172 -- --  INR -- -- -- 1.16 1.19  1) Large subcapsular and retroperitoneal hematoma involving the right kidney and pararenal tissues - likely due to excessive warfarin and uremic platelet dysfunction. S/p Off warfarin. Received FFP 2) H/O DVT, s/p IVC filter 2010 3 anemia  dopplers 10/30 - non- occlusive DVT involving the right common femoral vein and popliteal vein. Evidence of non- occlusive DVT involving the left posterior tibial vein. Plan: - No coum -has chronic IVC filter -hct improved after 1 unit  Cbc in am   C/V  Lab 03/29/12 1917 03/29/12 1317 03/29/12 0830  CKTOTAL -- -- 23  CKMB -- -- --  TROPONINI 0.89* 0.83* 0.93*   1) Hypotension (asymptomatic): off pressor 2) Pos trops - ? Denotes cardiovascular event due to stress 3) RV dysfunction  Plan: Not candidate for heparin or asa 7 day cortisol replacement remains plan, can taper today Continue midodrine  Renal:  Lab 04/02/12 0500 04/01/12 1323 04/01/12 0200  NA 136 135 134*  K 3.7 3.5 3.6  CL 100 99 99  CO2 29 29 31   BUN 33* 24* 17  CREATININE 3.69* 3.05* 2.48*   ESRD - P: HD per Renal service, on nulecit and aranesp Keep even to pos  Endocrine  Basename 04/02/12 0831 03/31/12 0014  GLUCAP 108* 119*   DM2 -  boarderline adrenal insuff.  Random cort: 19.6 Plan: HG protocol  Cont solucortef (started 11/1), remain ,lower  And wean to pred an doff over 3-4 days  Pulmonary ABG    Component Value Date/Time   PHART 7.345* 03/26/2012 1930   PCO2ART 46.9*  03/26/2012 1930   PO2ART 57.4* 03/26/2012 1930   HCO3 29.5* 03/27/2012 1830   TCO2 31.0 03/27/2012 1830   ACIDBASEDEF 0.4 03/26/2012 2030   O2SAT 67.9 03/27/2012 1830  Obesity at risk for hypoxia P: pulm hygiene On 2L O2 via n/c   ID:  Lab 04/02/12 0500 04/01/12 0200 03/31/12 0447 03/30/12 2020 03/30/12 0500  PROCALCITON -- 1.23 1.68 1.87 --  WBC 10.3 9.8 10.9* -- 13.9*  LATICACIDVEN -- -- -- -- --   No current evidence of infection P: Monitor   Neuro: No current problem   Global: BP low-normal, and Hgb low but stable, appropriate for transfer out of the ICU To traid, med floor  [employer]. Tyson Alias, MD, FACP Pgr: [phone] [facility]

## 2012-04-02 NOTE — Progress Notes (Signed)
[name]   Subjective:   Interval History: hypotension, anemia due to Large subcapsular and retroperitoneal hematoma involving the right kidney and pararenal tissues (based on CT abd).    Objective:  Vital signs in last 24 hours:  Temp:  [97.5 F (36.4 C)-98 F (36.7 C)] 97.5 F (36.4 C) (11/04 0400) Pulse Rate:  [57-83] 57  (11/04 0700) Resp:  [11-23] 14  (11/04 0700) BP: (77-134)/(28-118) 94/42 mmHg (11/04 0700) SpO2:  [77 %-100 %] 94 % (11/04 0700) Weight:  [156.4 kg (344 lb 12.8 oz)] 156.4 kg (344 lb 12.8 oz) (11/04 0600)  Weight change: 0.9 kg (1 lb 15.8 oz) Filed Weights   03/31/12 1600 04/01/12 0341 04/02/12 0600  Weight: 56.4 kg (124 lb 5.4 oz) 165.8 kg (365 lb 8.4 oz) 156.4 kg (344 lb 12.8 oz)    Intake/Output: I/O last 3 completed shifts: In: 801 [P.O.:500; I.V.:20; Blood:281] Out: -    Intake/Output this shift:     Gen: chronically ill appearing, no distress, morbidly obese  HEENT: WNL  Neck: JVP low likely  Chest: Clear anteriorly, left mild coarse  Cardiac: RRR s M  Abd: Obese, soft, NT, NABS, no new abdo pain  Ext: warm, trace bilateral ankle edema  Buttocks: no wound    Basic Metabolic Panel:  Lab 04/02/12 2956 04/01/12 1323 04/01/12 0200 03/31/12 0447 03/30/12 0500  NA 136 135 134* 135 137  K 3.7 3.5 3.6 4.1 3.6  CL 100 99 99 99 100  CO2 29 29 31 30 29   GLUCOSE 131* 146* 142* 132* 131*  BUN 33* 24* 17 28* 17  CREATININE 3.69* 3.05* 2.48* 4.10* 3.00*  CALCIUM 8.6 8.5 8.2* -- --  MG -- -- -- -- --  PHOS 3.7 3.2 2.9 3.8 3.2    Liver Function Tests:  Lab 04/02/12 0500 04/01/12 1323 04/01/12 0200 03/31/12 0447 03/30/12 0500 03/29/12 1317 03/27/12 0704  AST 10 -- -- 20 10 12 20   ALT 9 -- -- 15 21 26  54*  ALKPHOS 72 -- -- 83 89 85 86  BILITOT 0.3 -- -- 0.4 0.5 0.4 0.4  PROT 5.5* -- -- 6.2 6.1 5.9* 5.5*  ALBUMIN 1.8* 1.8* 1.7* 1.8*2.0* 2.0* -- --   No results found for this basename: LIPASE:5,AMYLASE:5 in  the last 168 hours No results found for this basename: AMMONIA:3 in the last 168 hours  CBC:  Lab 04/02/12 0500 04/01/12 0200 03/31/12 0447 03/30/12 0500 03/29/12 1702  WBC 10.3 9.8 10.9* 13.9* 13.3*  NEUTROABS -- -- -- -- --  HGB 7.4* 6.8* 7.1* 7.9* 7.8*  HCT 23.1* 21.4* 22.6* 25.2* 24.5*  MCV 98.7 100.5* 99.6 100.0 98.0  PLT 202 172 172 196 161    Cardiac Enzymes:  Lab 03/29/12 1917 03/29/12 1317 03/29/12 0830  CKTOTAL -- -- 23  CKMB -- -- --  CKMBINDEX -- -- --  TROPONINI 0.89* 0.83* 0.93*    BNP: No components found with this basename: POCBNP:5  CBG:  Lab 03/31/12 0014 03/30/12 0851 03/29/12 1636 03/29/12 1204 03/29/12 0837  GLUCAP 119* 136* 139* 142* 135*    Microbiology: Results for orders placed during the hospital encounter of 03/25/12  CULTURE, BLOOD (ROUTINE X 2)     Status: Normal   Collection Time   03/25/12 12:50 AM      Component Value Range Status Comment   Specimen Description BLOOD IV START   Final    Special Requests BOTTLES DRAWN AEROBIC AND ANAEROBIC [facility] EACH  Final    Culture  Setup Time 03/25/2012 17:15   Final    Culture NO GROWTH 5 DAYS   Final    Report Status 03/31/2012 FINAL   Final   CULTURE, BLOOD (ROUTINE X 2)     Status: Normal   Collection Time   03/25/12  1:25 AM      Component Value Range Status Comment   Specimen Description BLOOD RIGHT ARM   Final    Special Requests BOTTLES DRAWN AEROBIC ONLY 5CC   Final    Culture  Setup Time 03/25/2012 17:15   Final    Culture NO GROWTH 5 DAYS   Final    Report Status 03/31/2012 FINAL   Final   MRSA PCR SCREENING     Status: Normal   Collection Time   03/25/12  9:00 AM      Component Value Range Status Comment   MRSA by PCR NEGATIVE  NEGATIVE Final     Coagulation Studies: No results found for this basename: LABPROT:5,INR:5 in the last 72 hours  Urinalysis: No results found for this basename:  COLORURINE:2,APPERANCEUR:2,LABSPEC:2,PHURINE:2,GLUCOSEU:2,HGBUR:2,BILIRUBINUR:2,KETONESUR:2,PROTEINUR:2,UROBILINOGEN:2,NITRITE:2,LEUKOCYTESUR:2 in the last 72 hours    Imaging: No results found.   Medications:      . norepinephrine (LEVOPHED) Adult infusion Stopped (03/31/12 1700)      . albuterol  2.5 mg Nebulization Q4H  . darbepoetin (ARANESP) injection - DIALYSIS  200 mcg Intravenous Q Tue-HD  . escitalopram  20 mg Oral Daily  . ferric gluconate (FERRLECIT/NULECIT) IV  125 mg Intravenous Q T,Th,Sa-HD  . hydrocortisone sod succinate (SOLU-CORTEF) injection  50 mg Intravenous Q8H  . midodrine  10 mg Oral TID WC  . multivitamin  1 tablet Oral QHS  . OxyCODONE  60 mg Oral Q12H  . pantoprazole  40 mg Oral Q1200   sodium chloride, ALPRAZolam, feeding supplement (NEPRO CARB STEADY), feeding supplement (NEPRO CARB STEADY), heparin, heparin, lidocaine, lidocaine, lidocaine-prilocaine, lidocaine-prilocaine, morphine injection, ondansetron (ZOFRAN) IV, pentafluoroprop-tetrafluoroeth, pentafluoroprop-tetrafluoroeth  Assessment/ Plan:  [name] with ESRD, chronic pain and h/o DVT who presented to the ED 10/27 with RLQ pain, hypotension, anemia due to Large subcapsular and retroperitoneal hematoma involving the right kidney and pararenal tissues (based on CT abd). Received 4 units PRBCs and FFP. Admitted by PCCM to ICU. BP much improved AM 10/28 and transferred to Renal floor. Returned to ICU on 10/30 for hypotension.   1.ESRD TTS dialysis  2. Retroperitoneal hematoma Stable  3. Hypotension Midodrine 10 mg TID  4. Anemia. 200 mcg Aranesp and Iron   No dialysis planned today. Will plan HD in AM no heparin     LOS: 8 [name] W @TODAY @7 :55 AM

## 2012-04-03 ENCOUNTER — Inpatient Hospital Stay (HOSPITAL_COMMUNITY): Payer: BC Managed Care – PPO

## 2012-04-03 LAB — CBC
HCT: 24.7 % — ABNORMAL LOW (ref 39.0–52.0)
MCH: 31.5 pg (ref 26.0–34.0)
MCHC: 31.6 g/dL (ref 30.0–36.0)
MCV: 99.6 fL (ref 78.0–100.0)
Platelets: 226 10*3/uL (ref 150–400)
RDW: 18.9 % — ABNORMAL HIGH (ref 11.5–15.5)

## 2012-04-03 LAB — RENAL FUNCTION PANEL
Albumin: 2.1 g/dL — ABNORMAL LOW (ref 3.5–5.2)
BUN: 52 mg/dL — ABNORMAL HIGH (ref 6–23)
Calcium: 8.8 mg/dL (ref 8.4–10.5)
Creatinine, Ser: 4.95 mg/dL — ABNORMAL HIGH (ref 0.50–1.35)
GFR calc non Af Amer: 13 mL/min — ABNORMAL LOW (ref >90)

## 2012-04-03 MED ORDER — DARBEPOETIN ALFA-POLYSORBATE 200 MCG/0.4ML IJ SOLN
INTRAMUSCULAR | Status: AC
  Administered 2012-04-03: 200 ug via INTRAVENOUS
  Filled 2012-04-03: qty 0.4

## 2012-04-03 MED ORDER — MORPHINE SULFATE 2 MG/ML IJ SOLN
INTRAMUSCULAR | Status: AC
  Administered 2012-04-03: 2 mg via INTRAVENOUS
  Filled 2012-04-03: qty 1

## 2012-04-03 NOTE — Progress Notes (Deleted)
Pulled out xanax 1mg  for this pt in 2041, but meant to pull out 0.5mg  for another pt in 2040. Only 0.5mg  was given to the right pt in 2040, which was ordered, and the other 0.5mg  was returned to the pyxis.

## 2012-04-03 NOTE — Progress Notes (Signed)
I have seen and examined this patient and agree with the plan of care. Patient seen on dialysis with no complaints. Volume goal 3 L BFR 400 Time extended to 5 hrs No heparin due to [facility].  Junnie Loschiavo W 04/03/2012, 7:56 AM

## 2012-04-03 NOTE — Progress Notes (Signed)
[facility] PROGRESS NOTE  Sione Baumgarten BJY:[id number] DOB: October 30, 1965 DOA: 03/25/2012 PCP: No primary provider on file.  Assessment/Plan:   1. Large subcapsular and retroperitoneal hematoma involving the right kidney and pararenal tissues  Possibly from coagulopathy from excessive warfarin. He is currently off coumadin. And he is asymptomatic.   2. DVT s/p IVC filter.   3. Anemia: H&H stable.  Transfuse if Hemoglobin is less than 7.  4. Hypotension: possibly from bleed. Resolved now. Off pressors. On midodrine and steroid taper..  5. ESRD on HD further management as per Renal.   6. Diabetes Mellitus: uncontrolled.  CBG (last 3)   Basename 04/02/12 1112 04/02/12 0831  GLUCAP 143* 108*    DVT prophylaxis   Consults:  CCS (wilson) 10/27: Rec Urology consult. Signed off 10/28  Urology 10/27: recommends repeat CT abdomen in 6 weeks to evaluate the right kidney for a mass and hematoma resolution  Renal 10/27: managing HD   HPI/Subjective: Comfortable.   Objective: Filed Vitals:   04/03/12 1130 04/03/12 1150 04/03/12 1200 04/03/12 1252  BP: 110/51 114/56 94/52 104/56  Pulse: 62 62 61 61  Temp:   97.6 F (36.4 C)   TempSrc:   Oral Oral  Resp: 14 12 14 16   Height:      Weight:   156 kg (343 lb 14.7 oz)   SpO2:   94% 94%    Intake/Output Summary (Last 24 hours) at 04/03/12 2001 Last data filed at 04/03/12 1220  Gross per 24 hour  Intake    240 ml  Output   2200 ml  Net  -1960 ml   Filed Weights   04/03/12 0513 04/03/12 0700 04/03/12 1200  Weight: 162.3 kg (357 lb 12.9 oz) 158.2 kg (348 lb 12.3 oz) 156 kg (343 lb 14.7 oz)    Exam:   General:  Alert afebrile comfortable lying in bed.  Cardiovascular: s1s2  Respiratory: decreased at bases  Abdomen: SOFT NT ND BS+    Data Reviewed: Basic Metabolic Panel:  Lab 04/03/12 0865 04/02/12 0500 04/01/12 1323 04/01/12 0200 03/31/12 0447  NA 133* 136 135 134* 135  K 4.0 3.7 3.5 3.6 4.1  CL 98 100  99 99 99  CO2 26 29 29 31 30   GLUCOSE 115* 131* 146* 142* 132*  BUN 52* 33* 24* 17 28*  CREATININE 4.95* 3.69* 3.05* 2.48* 4.10*  CALCIUM 8.8 8.6 8.5 8.2* 8.6  MG -- -- -- -- --  PHOS 4.6 3.7 3.2 2.9 3.8   Liver Function Tests:  Lab 04/03/12 0750 04/02/12 0500 04/01/12 1323 04/01/12 0200 03/31/12 0447 03/30/12 0500 03/29/12 1317  AST -- 10 -- -- 20 10 12   ALT -- 9 -- -- 15 21 26   ALKPHOS -- 72 -- -- 83 89 85  BILITOT -- 0.3 -- -- 0.4 0.5 0.4  PROT -- 5.5* -- -- 6.2 6.1 5.9*  ALBUMIN 2.1* 1.8* 1.8* 1.7* 1.8*2.0* -- --   No results found for this basename: LIPASE:5,AMYLASE:5 in the last 168 hours No results found for this basename: AMMONIA:5 in the last 168 hours CBC:  Lab 04/03/12 0750 04/02/12 0500 04/01/12 0200 03/31/12 0447 03/30/12 0500  WBC 10.1 10.3 9.8 10.9* 13.9*  NEUTROABS -- -- -- -- --  HGB 7.8* 7.4* 6.8* 7.1* 7.9*  HCT 24.7* 23.1* 21.4* 22.6* 25.2*  MCV 99.6 98.7 100.5* 99.6 100.0  PLT 226 202 172 172 196   Cardiac Enzymes:  Lab 03/29/12 1917 03/29/12 1317 03/29/12 0830  CKTOTAL -- --  23  CKMB -- -- --  CKMBINDEX -- -- --  TROPONINI 0.89* 0.83* 0.93*   BNP (last 3 results) No results found for this basename: PROBNP:3 in the last 8760 hours CBG:  Lab 04/02/12 1112 04/02/12 0831 03/31/12 0014 03/30/12 0851 03/29/12 1636  GLUCAP 143* 108* 119* 136* 139*    Recent Results (from the past 240 hour(s))  CULTURE, BLOOD (ROUTINE X 2)     Status: Normal   Collection Time   03/25/12 12:50 AM      Component Value Range Status Comment   Specimen Description BLOOD IV START   Final    Special Requests BOTTLES DRAWN AEROBIC AND ANAEROBIC [facility] EACH   Final    Culture  Setup Time 03/25/2012 17:15   Final    Culture NO GROWTH 5 DAYS   Final    Report Status 03/31/2012 FINAL   Final   CULTURE, BLOOD (ROUTINE X 2)     Status: Normal   Collection Time   03/25/12  1:25 AM      Component Value Range Status Comment   Specimen Description BLOOD RIGHT ARM   Final     Special Requests BOTTLES DRAWN AEROBIC ONLY 5CC   Final    Culture  Setup Time 03/25/2012 17:15   Final    Culture NO GROWTH 5 DAYS   Final    Report Status 03/31/2012 FINAL   Final   MRSA PCR SCREENING     Status: Normal   Collection Time   03/25/12  9:00 AM      Component Value Range Status Comment   MRSA by PCR NEGATIVE  NEGATIVE Final      Studies: No results found.  Scheduled Meds:   . albuterol  2.5 mg Nebulization Q4H  . [EXPIRED] albuterol      . darbepoetin (ARANESP) injection - DIALYSIS  200 mcg Intravenous Q Tue-HD  . escitalopram  20 mg Oral Daily  . ferric gluconate (FERRLECIT/NULECIT) IV  125 mg Intravenous Q T,Th,Sa-HD  . midodrine  10 mg Oral TID WC  . multivitamin  1 tablet Oral QHS  . OxyCODONE  60 mg Oral Q12H  . pantoprazole  40 mg Oral Q1200  . [COMPLETED] predniSONE  40 mg Oral Q breakfast   Followed by  . predniSONE  20 mg Oral Q breakfast   Followed by  . predniSONE  10 mg Oral Q breakfast  . sodium chloride  10-40 mL Intracatheter Q12H   Continuous Infusions:   Principal Problem:  *Anemia Active Problems:  Retroperitoneal hemorrhage  Hemorrhagic shock  Coagulopathy  History of DVT (deep vein thrombosis)        [name]  [facility] Pager [phone] If 8PM-8AM, please contact night-coverage at [email or web address], password [facility] 04/03/2012, 8:01 PM  LOS: 9 days

## 2012-04-04 MED ORDER — OXYCODONE HCL ER 40 MG PO T12A
60.0000 mg | EXTENDED_RELEASE_TABLET | Freq: Three times a day (TID) | ORAL | Status: DC
  Administered 2012-04-04 – 2012-04-06 (×5): 60 mg via ORAL
  Filled 2012-04-04: qty 1
  Filled 2012-04-04: qty 6
  Filled 2012-04-04 (×2): qty 2
  Filled 2012-04-04: qty 1
  Filled 2012-04-04: qty 6

## 2012-04-04 MED ORDER — MORPHINE SULFATE 4 MG/ML IJ SOLN: 4.0000 mg | INTRAMUSCULAR | Status: DC | PRN

## 2012-04-04 MED ORDER — ALBUTEROL SULFATE (5 MG/ML) 0.5% IN NEBU
2.5000 mg | INHALATION_SOLUTION | Freq: Three times a day (TID) | RESPIRATORY_TRACT | Status: DC
  Administered 2012-04-04 (×2): 2.5 mg via RESPIRATORY_TRACT
  Filled 2012-04-04 (×3): qty 0.5

## 2012-04-04 MED ORDER — MORPHINE SULFATE 2 MG/ML IJ SOLN
2.0000 mg | INTRAMUSCULAR | Status: DC | PRN
  Administered 2012-04-04 – 2012-04-05 (×5): 2 mg via INTRAVENOUS
  Filled 2012-04-04 (×5): qty 1

## 2012-04-04 MED ORDER — MORPHINE SULFATE 4 MG/ML IJ SOLN
3.0000 mg | INTRAMUSCULAR | Status: DC | PRN
  Administered 2012-04-04: 3 mg via INTRAVENOUS
  Filled 2012-04-04: qty 1

## 2012-04-04 MED ORDER — ALBUTEROL SULFATE (5 MG/ML) 0.5% IN NEBU: 2.5000 mg | INHALATION_SOLUTION | Freq: Four times a day (QID) | RESPIRATORY_TRACT | Status: DC | PRN

## 2012-04-04 NOTE — Progress Notes (Signed)
[facility] ROUNDING NOTE   Subjective:   Interval History: hypotension, anemia due to Large subcapsular and retroperitoneal hematoma involving the right kidney and pararenal tissues    Objective:  Vital signs in last 24 hours:  Temp:  [97.3 F (36.3 C)-98.4 F (36.9 C)] 97.3 F (36.3 C) (11/06 1610) Pulse Rate:  [53-63] 61  (11/06 0613) Resp:  [12-18] 16  (11/06 0613) BP: (94-126)/(39-56) 102/52 mmHg (11/06 0613) SpO2:  [93 %-96 %] 95 % (11/06 0838) Weight:  [155.6 kg (343 lb 0.6 oz)-156 kg (343 lb 14.7 oz)] 155.6 kg (343 lb 0.6 oz) (11/06 9604)  Weight change: -6.3 kg (-13 lb 14.2 oz) Filed Weights   04/03/12 0700 04/03/12 1200 04/04/12 0613  Weight: 158.2 kg (348 lb 12.3 oz) 156 kg (343 lb 14.7 oz) 155.6 kg (343 lb 0.6 oz)    Intake/Output: I/O last 3 completed shifts: In: 480 [P.O.:480] Out: 2201 [Other:2200; Stool:1]   Intake/Output this shift:     Gen: chronically ill appearing, no distress, morbidly obese  HEENT: WNL  Neck: JVP low likely  Chest: Clear anteriorly, left mild coarse  Cardiac: RRR s M  Abd: Obese, soft, NT, NABS, no new abdo pain  Ext: warm, trace bilateral ankle edema      Basic Metabolic Panel:  Lab 04/03/12 5409 04/02/12 0500 04/01/12 1323 04/01/12 0200 03/31/12 0447  NA 133* 136 135 134* 135  K 4.0 3.7 3.5 3.6 4.1  CL 98 100 99 99 99  CO2 26 29 29 31 30   GLUCOSE 115* 131* 146* 142* 132*  BUN 52* 33* 24* 17 28*  CREATININE 4.95* 3.69* 3.05* 2.48* 4.10*  CALCIUM 8.8 8.6 8.5 -- --  MG -- -- -- -- --  PHOS 4.6 3.7 3.2 2.9 3.8    Liver Function Tests:  Lab 04/03/12 0750 04/02/12 0500 04/01/12 1323 04/01/12 0200 03/31/12 0447 03/30/12 0500 03/29/12 1317  AST -- 10 -- -- 20 10 12   ALT -- 9 -- -- 15 21 26   ALKPHOS -- 72 -- -- 83 89 85  BILITOT -- 0.3 -- -- 0.4 0.5 0.4  PROT -- 5.5* -- -- 6.2 6.1 5.9*  ALBUMIN 2.1* 1.8* 1.8* 1.7* 1.8*2.0* -- --   No results found for this basename: LIPASE:5,AMYLASE:5 in the last 168  hours No results found for this basename: AMMONIA:3 in the last 168 hours  CBC:  Lab 04/03/12 0750 04/02/12 0500 04/01/12 0200 03/31/12 0447 03/30/12 0500  WBC 10.1 10.3 9.8 10.9* 13.9*  NEUTROABS -- -- -- -- --  HGB 7.8* 7.4* 6.8* 7.1* 7.9*  HCT 24.7* 23.1* 21.4* 22.6* 25.2*  MCV 99.6 98.7 100.5* 99.6 100.0  PLT 226 202 172 172 196    Cardiac Enzymes:  Lab 03/29/12 1917 03/29/12 1317 03/29/12 0830  CKTOTAL -- -- 23  CKMB -- -- --  CKMBINDEX -- -- --  TROPONINI 0.89* 0.83* 0.93*    BNP: No components found with this basename: POCBNP:5  CBG:  Lab 04/02/12 1112 04/02/12 0831 03/31/12 0014 03/30/12 0851 03/29/12 1636  GLUCAP 143* 108* 119* 136* 139*    Microbiology: Results for orders placed during the hospital encounter of 03/25/12  CULTURE, BLOOD (ROUTINE X 2)     Status: Normal   Collection Time   03/25/12 12:50 AM      Component Value Range Status Comment   Specimen Description BLOOD IV START   Final    Special Requests BOTTLES DRAWN AEROBIC AND ANAEROBIC [facility] EACH   Final  Culture  Setup Time 03/25/2012 17:15   Final    Culture NO GROWTH 5 DAYS   Final    Report Status 03/31/2012 FINAL   Final   CULTURE, BLOOD (ROUTINE X 2)     Status: Normal   Collection Time   03/25/12  1:25 AM      Component Value Range Status Comment   Specimen Description BLOOD RIGHT ARM   Final    Special Requests BOTTLES DRAWN AEROBIC ONLY 5CC   Final    Culture  Setup Time 03/25/2012 17:15   Final    Culture NO GROWTH 5 DAYS   Final    Report Status 03/31/2012 FINAL   Final   MRSA PCR SCREENING     Status: Normal   Collection Time   03/25/12  9:00 AM      Component Value Range Status Comment   MRSA by PCR NEGATIVE  NEGATIVE Final     Coagulation Studies:  Basename 04/02/12 1355  LABPROT 14.0  INR 1.09    Urinalysis: No results found for this basename:  COLORURINE:2,APPERANCEUR:2,LABSPEC:2,PHURINE:2,GLUCOSEU:2,HGBUR:2,BILIRUBINUR:2,KETONESUR:2,PROTEINUR:2,UROBILINOGEN:2,NITRITE:2,LEUKOCYTESUR:2 in the last 72 hours    Imaging: No results found.   Medications:        . albuterol  2.5 mg Nebulization TID  . darbepoetin (ARANESP) injection - DIALYSIS  200 mcg Intravenous Q Tue-HD  . escitalopram  20 mg Oral Daily  . ferric gluconate (FERRLECIT/NULECIT) IV  125 mg Intravenous Q T,Th,Sa-HD  . midodrine  10 mg Oral TID WC  . multivitamin  1 tablet Oral QHS  . OxyCODONE  60 mg Oral Q12H  . pantoprazole  40 mg Oral Q1200  . predniSONE  20 mg Oral Q breakfast   Followed by  . predniSONE  10 mg Oral Q breakfast  . sodium chloride  10-40 mL Intracatheter Q12H  . [DISCONTINUED] albuterol  2.5 mg Nebulization Q4H   sodium chloride, albuterol, ALPRAZolam, feeding supplement (NEPRO CARB STEADY), feeding supplement (NEPRO CARB STEADY), heparin, heparin, lidocaine, lidocaine, lidocaine-prilocaine, lidocaine-prilocaine, morphine injection, ondansetron (ZOFRAN) IV, pentafluoroprop-tetrafluoroeth, pentafluoroprop-tetrafluoroeth, sodium chloride  Assessment/ Plan:   [name] with ESRD, chronic pain and h/o DVT who presented to the ED 10/27 with RLQ pain, hypotension, anemia due to Large subcapsular and retroperitoneal hematoma involving the right kidney and pararenal tissues (based on CT abd). Received 4 units PRBCs and FFP. Admitted by PCCM to ICU. BP much improved AM 10/28 and transferred to Renal floor. Returned to ICU on 10/30 for hypotension.   1.ESRD TTS dialysis  2. Retroperitoneal hematoma Stable   3. Hypotension Midodrine 10 mg TID   4. Anemia. 200 mcg Aranesp and Iron   No dialysis planned today. Will plan HD in AM no heparin   LOS: 10 [name] @TODAY @9 :02 AM

## 2012-04-04 NOTE — Progress Notes (Signed)
PT Cancellation Note  Patient Details Name: [name] MRN: [id number] DOB: Jun 23, 1965   Cancelled Treatment:    Reason Eval/Treat Not Completed: Other (comment) (pt gone off floor in W/C when OT/PT needed to see him) 04/04/2012  [place] Bing, PT [phone] (pager)    [place] Amon, Eliseo Gum 04/04/2012, 4:22 PM

## 2012-04-04 NOTE — Progress Notes (Signed)
[facility] PROGRESS NOTE  [name] GNF:[id number] DOB: 22-Nov-1965 DOA: 03/25/2012 PCP: No primary provider on file.  Assessment/Plan: Principal Problem:  *Anemia Active Problems:  Retroperitoneal hemorrhage  Hemorrhagic shock  Coagulopathy  History of DVT (deep vein thrombosis)    1. Retroperitoneal Hematoma: Patenet presented with RLQ abdominal pain, associated with hypotension, against a background of coumadin-induced coagulopathy/INR>6. Imaging studies revealed a large subcapsular and retroperitoneal hematoma involving the right kidney and pararenal tissues. Coumadin was discontinued, and patient was managed reversal of anticoagulation with FFP, Vit K and DDAVP. Clinically he has improved, with amelioration of RLQ pain. He did require transfusion of 1 unit PRBC on 04/03/12. Will repeat abdominal CT on 04/05/12.  2. History of DVT: Patient had a lower extremity DVI in 2009, and hd IVC filter placed in 2011. He had been on anticoagulation with Heparin, till 11/2010, when he was instituted on Coumadin. Patient is of course, no longer a candidate for anticoagulation, due to #1 above.  Venous doppler of 03/28/12, showed findings consistent with deep vein thrombosis involving the right common femoral vein, right popliteal vein, and left posterial tibial vein. Fortunately, CTA showed no evidence of pulmonary embolism.  3. Anemia: This is multifactorial, due to chronic disease and acute blood los from hematoma. Transfusing prn. Hemoglobin is 7.8 today.  4. Hypotension: This was evident on presentation, with SBP in the 50s, requiring resuscitation with iv fluids, iv steroids and pressors. Resolved now. Off pressors. On midodrine and steroid taper. Patient has chronically low, normal BP.  5. ESRD on HD: Managing per renal. Dr Arta Silence provided nephrology consultation.  6. Diabetes Mellitus: Controlled on SSI   Code Status: Full Code.  Family Communication:  Disposition Plan: To be  determined.    Brief narrative: [name] with ESRD, chronic pain and h/o DVT who presented to the ED with RLQ pain and hypotension. He has held his coumadin since Thursday as he was told his INR was greater than 6. Yesterday he was feeling weak with nausea, vomiting and RLQ pain. His chronic back pain has worsened. He reports small amounts of blood in his emesis, however in the ED no blood was noted on lavage and was guiac negative on rectal. CT abdomen and pelvis done in the ED showing right retroperitoneal hematoma. He denies fever, chills, SOB, or chest pain. In ED he received 2 L NS, 2 units FFP, vitamin K, and DDAVP. Blood pressure remained labile with SBP ranging from 50s-90s.   Consultants:  Dr Delano Metz, nephrologist.  Dr Jerilee Field, urologist.  Dr Gaynelle Adu, surgeon.   Procedures:  CT abdomen/Pelvis.   CTA.   Antibiotics:  Ertapenem on 03/24/12 only.   HPI/Subjective: Still has some RLQ abdominal pain, but much less.   Objective: Vital signs in last 24 hours: Temp:  [97.3 F (36.3 C)-98.4 F (36.9 C)] 97.3 F (36.3 C) (11/06 8469) Pulse Rate:  [57-61] 61  (11/06 0613) Resp:  [16-18] 16  (11/06 0613) BP: (102-112)/(49-52) 102/52 mmHg (11/06 0613) SpO2:  [93 %-96 %] 95 % (11/06 0838) Weight:  [155.6 kg (343 lb 0.6 oz)] 155.6 kg (343 lb 0.6 oz) (11/06 6295) Weight change: -6.3 kg (-13 lb 14.2 oz) Last BM Date: 04/03/12  Intake/Output from previous day: 11/05 0701 - 11/06 0700 In: 480 [P.O.:480] Out: 2201 [Stool:1] Total I/O In: 240 [P.O.:240] Out: -    Physical Exam: General: Comfortable, alert, communicative, fully oriented, not short of breath at rest.  HEENT:  Moderate clinical pallor, no  jaundice, no conjunctival injection or discharge. NECK:  Supple, JVP not seen, no carotid bruits, no palpable lymphadenopathy, no palpable goiter. CHEST:  Clinically clear to auscultation, no wheezes, no crackles. HEART:  Sounds 1 and 2 heard, normal,  regular, no murmurs. ABDOMEN:  Morbidly obese, soft, non-tender, no palpable organomegaly, no palpable masses, normal bowel sounds. GENITALIA:  Not examined. . LOWER EXTREMITIES:  No pitting edema, palpable peripheral pulses. MUSCULOSKELETAL SYSTEM:  Unremarkable.  CENTRAL NERVOUS SYSTEM:  No focal neurologic deficit on gross examination.  Lab Results:  Basename 04/03/12 0750 04/02/12 0500  WBC 10.1 10.3  HGB 7.8* 7.4*  HCT 24.7* 23.1*  PLT 226 202    Basename 04/03/12 0750 04/02/12 0500  NA 133* 136  K 4.0 3.7  CL 98 100  CO2 26 29  GLUCOSE 115* 131*  BUN 52* 33*  CREATININE 4.95* 3.69*  CALCIUM 8.8 8.6   No results found for this or any previous visit (from the past 240 hour(s)).   Studies/Results: No results found.  Medications: Scheduled Meds:   . albuterol  2.5 mg Nebulization TID  . darbepoetin (ARANESP) injection - DIALYSIS  200 mcg Intravenous Q Tue-HD  . escitalopram  20 mg Oral Daily  . ferric gluconate (FERRLECIT/NULECIT) IV  125 mg Intravenous Q T,Th,Sa-HD  . midodrine  10 mg Oral TID WC  . multivitamin  1 tablet Oral QHS  . OxyCODONE  60 mg Oral Q12H  . pantoprazole  40 mg Oral Q1200  . predniSONE  20 mg Oral Q breakfast   Followed by  . predniSONE  10 mg Oral Q breakfast  . sodium chloride  10-40 mL Intracatheter Q12H  . [DISCONTINUED] albuterol  2.5 mg Nebulization Q4H   Continuous Infusions:  PRN Meds:.sodium chloride, albuterol, ALPRAZolam, feeding supplement (NEPRO CARB STEADY), feeding supplement (NEPRO CARB STEADY), heparin, heparin, lidocaine, lidocaine, lidocaine-prilocaine, lidocaine-prilocaine, morphine injection, ondansetron (ZOFRAN) IV, pentafluoroprop-tetrafluoroeth, pentafluoroprop-tetrafluoroeth, sodium chloride    LOS: 10 days   [name],[name]  [facility] Pager [phone]. If 8PM-8AM, please contact night-coverage at [email or web address], password [facility] 04/04/2012, 3:03 PM  LOS: 10 days

## 2012-04-04 NOTE — Progress Notes (Signed)
OT Cancellation Note  Patient Details Name: [name] MRN: [id number] DOB: Apr 26, 1966   Cancelled Treatment:    Reason Eval/Treat Not Completed: Other (comment) (Pt off unit in wheelchair)  [name], [name] 04/04/2012, 3:24 PM

## 2012-04-05 ENCOUNTER — Inpatient Hospital Stay (HOSPITAL_COMMUNITY): Payer: BC Managed Care – PPO

## 2012-04-05 LAB — RENAL FUNCTION PANEL
Albumin: 2.2 g/dL — ABNORMAL LOW (ref 3.5–5.2)
BUN: 53 mg/dL — ABNORMAL HIGH (ref 6–23)
CO2: 29 mEq/L (ref 19–32)
Calcium: 8.4 mg/dL (ref 8.4–10.5)
Creatinine, Ser: 4.51 mg/dL — ABNORMAL HIGH (ref 0.50–1.35)
GFR calc Af Amer: 17 mL/min — ABNORMAL LOW (ref >90)
GFR calc non Af Amer: 13 mL/min — ABNORMAL LOW (ref >90)
GFR calc non Af Amer: 15 mL/min — ABNORMAL LOW (ref >90)
Glucose, Bld: 150 mg/dL — ABNORMAL HIGH (ref 70–99)
Phosphorus: 4.2 mg/dL (ref 2.3–4.6)
Phosphorus: 4.6 mg/dL (ref 2.3–4.6)
Potassium: 4.1 mEq/L (ref 3.5–5.1)
Sodium: 140 mEq/L (ref 135–145)

## 2012-04-05 LAB — CBC
HCT: 26.5 % — ABNORMAL LOW (ref 39.0–52.0)
MCH: 31.4 pg (ref 26.0–34.0)
MCHC: 30.5 g/dL (ref 30.0–36.0)
MCHC: 30.6 g/dL (ref 30.0–36.0)
MCV: 102.7 fL — ABNORMAL HIGH (ref 78.0–100.0)
Platelets: 219 10*3/uL (ref 150–400)
Platelets: 229 10*3/uL (ref 150–400)
RBC: 2.55 MIL/uL — ABNORMAL LOW (ref 4.22–5.81)
RDW: 19.1 % — ABNORMAL HIGH (ref 11.5–15.5)
WBC: 13.6 10*3/uL — ABNORMAL HIGH (ref 4.0–10.5)

## 2012-04-05 MED ORDER — ALBUTEROL SULFATE (5 MG/ML) 0.5% IN NEBU: 2.5000 mg | INHALATION_SOLUTION | RESPIRATORY_TRACT | Status: DC | PRN

## 2012-04-05 NOTE — Progress Notes (Signed)
[facility] PROGRESS NOTE  [name] AVW:[id number] DOB: 1966-03-23 DOA: 03/25/2012 PCP: No primary provider on file.  Assessment/Plan: Principal Problem:  *Anemia Active Problems:  Retroperitoneal hemorrhage  Hemorrhagic shock  Coagulopathy  History of DVT (deep vein thrombosis)    1. Retroperitoneal Hematoma: Patient presented with RLQ abdominal pain, associated with hypotension, against a background of coumadin-induced coagulopathy/INR>6. Imaging studies revealed a large subcapsular and retroperitoneal hematoma involving the right kidney and pararenal tissues. Coumadin was discontinued, and patient was managed reversal of anticoagulation with FFP, Vit K and DDAVP. Clinically he has improved, with amelioration of RLQ pain. He did require transfusion of 1 unit PRBC on 04/03/12. Repeat abdominal/pelvic CT on 04/05/12, showed persistent large right perinephric and retroperitoneal hematoma, which is stable. Patient will need a follow up CT scan in a month or so, as well as urology follow up.  2. History of DVT: Patient had a lower extremity DVT in 2009, and had IVC filter placed in 2011. He had been on anticoagulation with Heparin, till 11/2010, when he was instituted on Coumadin. Patient is of course, no longer a candidate for anticoagulation, due to #1 above.  Venous doppler of 03/28/12, showed findings consistent with deep vein thrombosis involving the right common femoral vein, right popliteal vein, and left posterial tibial vein. Fortunately, CTA showed no evidence of pulmonary embolism.  3. Anemia: This is multifactorial, due to chronic disease and acute blood loss from hematoma. Patient did receive some units of PRBC during his hospitalization. Hemoglobin has remained stable, and is 8.1 today.   4. Hypotension: This was evident on presentation, with SBP in the 50s, requiring resuscitation with iv fluids, iv steroids and pressors. Resolved now. Off pressors. On midodrine and steroid  taper. Patient has chronically low, normal BP.  5. ESRD on HD: Usual dialysis schedule is T-T-S. Managing per renal. Dr Arta Silence provided nephrology consultation.  6. Diabetes Mellitus: Controlled on SSI   Code Status: Full Code.  Family Communication:  Disposition Plan: Aiming discharge on 04/06/12.    Brief narrative: [name] with ESRD, chronic pain and h/o DVT who presented to the ED with RLQ pain and hypotension. He has held his coumadin since Thursday as he was told his INR was greater than 6. Yesterday he was feeling weak with nausea, vomiting and RLQ pain. His chronic back pain has worsened. He reports small amounts of blood in his emesis, however in the ED no blood was noted on lavage and was guiac negative on rectal. CT abdomen and pelvis done in the ED showing right retroperitoneal hematoma. He denies fever, chills, SOB, or chest pain. In ED he received 2 L NS, 2 units FFP, vitamin K, and DDAVP. Blood pressure remained labile with SBP ranging from 50s-90s.   Consultants:  Dr Delano Metz, nephrologist.  Dr Jerilee Field, urologist.  Dr Gaynelle Adu, surgeon.   Procedures:  CT abdomen/Pelvis.   CTA.   Antibiotics:  Ertapenem on 03/24/12 only.   HPI/Subjective: No new issues.   Objective: Vital signs in last 24 hours: Temp:  [96.6 F (35.9 C)-97.6 F (36.4 C)] 96.6 F (35.9 C) (11/07 1325) Pulse Rate:  [60-74] 65  (11/07 1500) Resp:  [13-22] 13  (11/07 1500) BP: (82-115)/(35-59) 107/35 mmHg (11/07 1500) SpO2:  [95 %-100 %] 98 % (11/07 1343) Weight:  [161.8 kg (356 lb 11.3 oz)] 161.8 kg (356 lb 11.3 oz) (11/07 1325) Weight change:  Last BM Date: 04/03/12  Intake/Output from previous day: 11/06 0701 - 11/07 0700  In: 240 [P.O.:240] Out: -  Total I/O In: 360 [P.O.:360] Out: -    Physical Exam: General: Comfortable on HD, alert, communicative, fully oriented, not short of breath at rest.  HEENT:  Moderate clinical pallor, no jaundice, no conjunctival  injection or discharge. NECK:  Supple, JVP not seen, no carotid bruits, no palpable lymphadenopathy, no palpable goiter. CHEST:  Clinically clear to auscultation, no wheezes, no crackles. HEART:  Sounds 1 and 2 heard, normal, regular, no murmurs. ABDOMEN:  Morbidly obese, soft, non-tender, no palpable organomegaly, no palpable masses, normal bowel sounds. GENITALIA:  Not examined. . LOWER EXTREMITIES:  No pitting edema, palpable peripheral pulses. MUSCULOSKELETAL SYSTEM:  Unremarkable.  CENTRAL NERVOUS SYSTEM:  No focal neurologic deficit on gross examination.  Lab Results:  [facility] 04/05/12 1346 04/05/12 0433  WBC 13.6* 11.5*  HGB 8.1* 8.0*  HCT 26.5* 26.2*  PLT 229 219    Basename 04/05/12 1346 04/04/12 2345  NA 139 140  K 4.1 3.6  CL 101 103  CO2 26 29  GLUCOSE 162* 150*  BUN 53* 46*  CREATININE 4.90* 4.51*  CALCIUM 8.7 8.4   No results found for this or any previous visit (from the past 240 hour(s)).   Studies/Results: Ct Abdomen Pelvis Wo Contrast  04/05/2012  *RADIOLOGY REPORT*  Clinical Data: Follow-up retroperitoneal hematoma.  CT ABDOMEN AND PELVIS WITHOUT CONTRAST  Technique:  Multidetector CT imaging of the abdomen and pelvis was performed following the standard protocol without intravenous contrast.  Comparison: 03/28/2012.  Findings: Persistent small bilateral pleural effusions and bibasilar atelectasis. A small amount of pericardial fluid is stable.  The large perinephric and retroperitoneal hematoma is stable.  No new hemorrhages seen.  Stable surgical changes involving the stomach.  Stable splenomegaly.  IMPRESSION: Persistent large right perinephric and retroperitoneal hematoma. Small bilateral pleural effusions and bibasilar atelectasis.   Original Report Authenticated By: Rudie Meyer, M.D.     Medications: Scheduled Meds:    . darbepoetin (ARANESP) injection - DIALYSIS  200 mcg Intravenous Q Tue-HD  . escitalopram  20 mg Oral Daily  . ferric gluconate  (FERRLECIT/NULECIT) IV  125 mg Intravenous Q T,Th,Sa-HD  . midodrine  10 mg Oral TID WC  . multivitamin  1 tablet Oral QHS  . OxyCODONE  60 mg Oral Q8H  . pantoprazole  40 mg Oral Q1200  . [COMPLETED] predniSONE  20 mg Oral Q breakfast   Followed by  . predniSONE  10 mg Oral Q breakfast  . sodium chloride  10-40 mL Intracatheter Q12H  . [DISCONTINUED] albuterol  2.5 mg Nebulization TID  . [DISCONTINUED] OxyCODONE  60 mg Oral Q12H   Continuous Infusions:  PRN Meds:.sodium chloride, albuterol, ALPRAZolam, feeding supplement (NEPRO CARB STEADY), feeding supplement (NEPRO CARB STEADY), heparin, heparin, lidocaine, lidocaine, lidocaine-prilocaine, lidocaine-prilocaine, morphine injection, ondansetron (ZOFRAN) IV, pentafluoroprop-tetrafluoroeth, pentafluoroprop-tetrafluoroeth, sodium chloride, [DISCONTINUED] albuterol, [DISCONTINUED]  morphine injection    LOS: 11 days   Yania Bogie,[name]  [facility] Pager [phone]. If 8PM-8AM, please contact night-coverage at [email or web address], password [facility] 04/05/2012, 4:51 PM  LOS: 11 days

## 2012-04-05 NOTE — Progress Notes (Signed)
Physical Therapy Treatment Patient Details Name: [name] MRN: [id number] DOB: 05-03-1966 Today's Date: 04/05/2012 Time: 4098-1191 PT Time Calculation (min): 34 min  PT Assessment / Plan / Recommendation Comments on Treatment Session  Patient s/p anemia with decr mobility secondary to decr endurance and decr strength bil LEs.  Would only agree to exercise today.  States he feels comfortable going home and using scooter and that sister assisted him to scooter yesterday and it went well.  HHPT f/u recommended.      Follow Up Recommendations  Home health PT;Supervision - Intermittent     Does the patient have the potential to tolerate intense rehabilitation     Barriers to Discharge        Equipment Recommendations  None recommended by PT    Recommendations for Other Services    Frequency Min 3X/week   Plan Discharge plan remains appropriate;Frequency remains appropriate    Precautions / Restrictions Precautions Precautions: Fall   Pertinent Vitals/Pain VSS, Some pain    Mobility  Bed Mobility Bed Mobility: Rolling Left;Left Sidelying to Sit;Sitting - Scoot to Edge of Bed Rolling Left: 7: Independent Left Sidelying to Sit: 7: Independent Supine to Sit: Not tested (comment) Sitting - Scoot to Edge of Bed: 5: Supervision Sit to Supine: Not Tested (comment) Transfers Transfers: Not assessed Ambulation/Gait Ambulation/Gait Assistance: Not tested (comment) Stairs: No Wheelchair Mobility Wheelchair Mobility: No    Exercises General Exercises - Lower Extremity Ankle Circles/Pumps: Both;10 reps;Seated;AAROM (Pt. used sheet around forefoot for stretch and mvement) Long Arc Quad: AAROM;Both;10 reps;Seated (used sheet for active assist) Heel Slides: AAROM;Both;5 reps;Seated Hip ABduction/ADduction: AROM;Both;10 reps;Supine Straight Leg Raises: AROM;Both;5 reps;Supine    PT Goals Acute Rehab PT Goals PT Goal: Supine/Side to Sit - Progress: Met Additional Goals PT  Goal: Additional Goal #1 - Progress: Progressing toward goal  Visit Information  Last PT Received On: 04/05/12 Assistance Needed: +1    Subjective Data  Subjective: "I am too tired to do anything but exercise."   Cognition  Overall Cognitive Status: Appears within functional limits for tasks assessed/performed Arousal/Alertness: Awake/alert Orientation Level: Appears intact for tasks assessed Behavior During Session: [facility] for tasks performed    Balance  Balance Balance Assessed: Yes Static Sitting Balance Static Sitting - Balance Support: No upper extremity supported;Feet supported Static Sitting - Level of Assistance: 5: Stand by assistance Static Sitting - Comment/# of Minutes: Performed exercises sitting EOB.   Dynamic Sitting Balance Dynamic Sitting - Balance Support: No upper extremity supported Dynamic Sitting - Level of Assistance: Not tested (comment) Static Standing Balance Static Standing - Level of Assistance: Not tested (comment)  End of Session PT - End of Session Equipment Utilized During Treatment: Oxygen Activity Tolerance: Patient limited by fatigue Patient left: in bed;with call bell/phone within reach Nurse Communication: Mobility status       INGOLD,Frederick Marro 04/05/2012, 2:07 PM  [facility] Acute Rehabilitation [phone] (pager)

## 2012-04-05 NOTE — Discharge Summary (Signed)
Physician Discharge Summary  Lum Stillinger ZOX:[id number] DOB: [birth year] DOA: 03/25/2012  PCP: No primary provider on file.  Admit date: 03/25/2012 Discharge date: 04/06/2012  Time spent: 40  minutes  Recommendations for Outpatient Follow-up:  1. Follow up with primary nephrologist. 2. Follow up with Dr Mena Goes,  urologist.   Discharge Diagnoses:  Principal Problem:  *Anemia Active Problems:  Retroperitoneal hemorrhage  Hemorrhagic shock  Coagulopathy  History of DVT (deep vein thrombosis)   Discharge Condition: Satisfactory.   Diet recommendation: Renal 60-2-2.  Filed Weights   04/05/12 1325 04/05/12 1845 04/06/12 0700  Weight: 161.8 kg (356 lb 11.3 oz) 158.9 kg (350 lb 5 oz) 159.9 kg (352 lb 8.3 oz)    History of present illness:  Brief narrative:  [name] with ESRD, chronic pain and h/o DVT who presented to the ED with RLQ pain and hypotension. He has held his coumadin since Thursday as he was told his INR was greater than 6. Yesterday he was feeling weak with nausea, vomiting and RLQ pain. His chronic back pain has worsened. He reports small amounts of blood in his emesis, however in the ED no blood was noted on lavage and was guiac negative on rectal. CT abdomen and pelvis done in the ED showing right retroperitoneal hematoma. He denies fever, chills, SOB, or chest pain. In ED he received 2 L NS, 2 units FFP, vitamin K, and DDAVP. Blood pressure remained labile with SBP ranging from 50s-90s.   Hospital Course:  1. Retroperitoneal Hematoma: Patient presented with RLQ abdominal pain, associated with hypotension, against a background of coumadin-induced coagulopathy/INR>6. Imaging studies revealed a large subcapsular and retroperitoneal hematoma involving the right kidney and pararenal tissues. Coumadin was discontinued, and patient was managed reversal of anticoagulation with FFP, Vit K and DDAVP. Clinically, as of 04/04/12, he had improved, with amelioration of RLQ pain. He  did require transfusion of 1 unit PRBC on 04/03/12. Repeat abdominal/pelvic CT on 04/05/12, showed persistent large right perinephric and retroperitoneal hematoma, which is stable. Patient will need a follow up CT scan in a month or so, as well as urology follow up.  2. History of DVT: Patient had a lower extremity DVT in 2009, and had IVC filter placed in 2011. He had been on anticoagulation with Heparin, till 11/2010, when he was instituted on Coumadin. Patient is of course, no longer a candidate for anticoagulation, due to #1 above. Venous doppler of 03/28/12, showed findings consistent with deep vein thrombosis involving the right common femoral vein, right popliteal vein, and left posterial tibial vein. Fortunately, CTA showed no evidence of pulmonary embolism.  3. Anemia: This is multifactorial, due to chronic disease and acute blood loss from hematoma. Patient did receive some units of PRBC during his hospitalization. Hemoglobin has remained stable, and was 8.1 on 04/06/12. 4. Hypotension: This was evident on presentation, with SBP in the 50s, requiring resuscitation with iv fluids, iv steroids and pressors. Resolved now. Off pressors. On Midodrine. Patient has chronically low, normal BP.  5. ESRD on HD: Usual dialysis schedule is T-T-S. Dr Arta Silence provided nephrology consultation.  6. Diabetes Mellitus: Controlled on SSI, during this hospitalization.   Procedures:  See Below.   Consultations: Dr Delano Metz, nephrologist.  Dr Jerilee Field, urologist.  Dr Gaynelle Adu, surgeon.   Discharge Exam: Filed Vitals:   04/05/12 1827 04/05/12 1845 04/05/12 2021 04/06/12 0700  BP: 98/43  109/42 103/35  Pulse: 60 61 66 63  Temp:  98.4 F (36.9 C) 97.6 F (  36.4 C) 98.2 F (36.8 C)  TempSrc:  Oral Oral Oral  Resp: 16 18 18 18   Height:      Weight:  158.9 kg (350 lb 5 oz)  159.9 kg (352 lb 8.3 oz)  SpO2:  100% 96% 99%    General: Comfortable on HD, alert, communicative, fully oriented, not  short of breath at rest.  HEENT: Moderate clinical pallor, no jaundice, no conjunctival injection or discharge.  NECK: Supple, JVP not seen, no carotid bruits, no palpable lymphadenopathy, no palpable goiter.  CHEST: Clinically clear to auscultation, no wheezes, no crackles.  HEART: Sounds 1 and 2 heard, normal, regular, no murmurs.  ABDOMEN: Morbidly obese, soft, non-tender, no palpable organomegaly, no palpable masses, normal bowel sounds.  GENITALIA: Not examined. .  LOWER EXTREMITIES: No pitting edema, palpable peripheral pulses.  MUSCULOSKELETAL SYSTEM: Unremarkable.  CENTRAL NERVOUS SYSTEM: No focal neurologic deficit on gross examination.  Discharge Instructions      Discharge Orders    Future Orders Please Complete By Expires   Diet - low sodium heart healthy      Home Health      Questions: Responses:   To provide the following care/treatments PT   Face-to-face encounter      Comments:   I Lakeesha Fontanilla,[name] certify that this patient is under my care and that I, or a nurse practitioner or physician's assistant working with me, had a face-to-face encounter that meets the physician face-to-face encounter requirements with this patient on 04/06/2012.   Questions: Responses:   The encounter with the patient was in whole, or in part, for the following medical condition, which is the primary reason for home health care Retroperitonal hematoma, ESRD, anemia.   I certify that, based on my findings, the following services are medically necessary home health services Physical therapy   My clinical findings support the need for the above services Complex treatment plan/patient with lack knowledge disease process and treatment   Further, I certify that my clinical findings support that this patient is homebound due to: Shortness of Breath with activity   To provide the following care/treatments PT   Increase activity slowly          Medication List     As of 04/06/2012 11:48 AM    STOP  taking these medications         warfarin 10 MG tablet   Commonly known as: COUMADIN      TAKE these medications         ALPRAZolam 1 MG tablet   Commonly known as: XANAX   Take 1 mg by mouth every 6 (six) hours as needed. anxiety      carisoprodol 350 MG tablet   Commonly known as: SOMA   Take 350 mg by mouth 3 (three) times daily.      epoetin alfa [address]109 UNIT/ML injection   Commonly known as: EPOGEN,PROCRIT   Inject 6,500 Units into the skin 3 (three) times a week. Receives at [facility]. Last visit 08/23/11      escitalopram 20 MG tablet   Commonly known as: LEXAPRO   Take 20 mg by mouth daily.      feeding supplement (NEPRO CARB STEADY) Liqd   Take 237 mLs by mouth as needed (missed meal during dialysis.).      midodrine 10 MG tablet   Commonly known as: PROAMATINE   Take 1 tablet (10 mg total) by mouth 3 (three) times daily with meals.      multivitamin  Tabs tablet   Take 1 tablet by mouth daily.      omeprazole 20 MG capsule   Commonly known as: PRILOSEC   Take 20 mg by mouth 2 (two) times daily.      Oxycodone HCl 60 MG Tb12   Take 1 tablet (60 mg total) by mouth every 8 (eight) hours.      sevelamer 800 MG tablet   Commonly known as: RENVELA   Take 800-1,600 mg by mouth See admin instructions. Two capsules with each meal, and one capsule with each snack         Follow-up Information    Please follow up. (Follow up with your primary nephrologist as needed)       Schedule an appointment as soon as possible for a visit with Mena Goes Lowella Petties, MD.   Contact information:   [address]                         [facility] [place] [address] [phone]           The results of significant diagnostics from this hospitalization (including imaging, microbiology, ancillary and laboratory) are listed below for reference.    Significant Diagnostic Studies: Ct Abdomen Pelvis Wo Contrast  04/05/2012   *RADIOLOGY REPORT*  Clinical Data: Follow-up retroperitoneal hematoma.  CT ABDOMEN AND PELVIS WITHOUT CONTRAST  Technique:  Multidetector CT imaging of the abdomen and pelvis was performed following the standard protocol without intravenous contrast.  Comparison: 03/28/2012.  Findings: Persistent small bilateral pleural effusions and bibasilar atelectasis. A small amount of pericardial fluid is stable.  The large perinephric and retroperitoneal hematoma is stable.  No new hemorrhages seen.  Stable surgical changes involving the stomach.  Stable splenomegaly.  IMPRESSION: Persistent large right perinephric and retroperitoneal hematoma. Small bilateral pleural effusions and bibasilar atelectasis.   Original Report Authenticated By: Rudie Meyer, M.D.    Ct Abdomen Pelvis Wo Contrast  03/28/2012  *RADIOLOGY REPORT*  Clinical Data:  Right lower quadrant pain shortness of breath.  CT ANGIOGRAPHY CHEST WITH CONTRAST  Technique:  Multidetector CT imaging of the chest was performed using the standard protocol during bolus administration of intravenous contrast.  Multiplanar CT image reconstructions including MIPs were obtained to evaluate the vascular anatomy.  Contrast: OMNIPAQUE IOHEXOL 350 MG/ML SOLN  Comparison:  CTA abdomen pelvis 03/25/2012.  Findings:  Pulmonary arterial opacification is satisfactory.  The bolus is suboptimal, degrading evaluation for pulmonary emboli in the segmental vessels.  No significant proximal embolus is present to at least the level of the lobe arteries.  The heart size is slightly enlarged.  A small pericardial effusion is present.  Small bilateral pleural effusions are noted.  No significant adenopathy is present.  The bilateral medial lower lobe airspace consolidation is present bilaterally.  Scattered areas of ground-glass attenuation present throughout both lungs.  Linear airspace consolidation is present in the medial left upper lobe.  There is posteromedial consolidation in  the superior segment of the left lower lobe and within the upper lobe.  Review of the MIP images confirms the above findings.  IMPRESSION:  1.  No focal filling defects present to suggest a proximal pulmonary embolus.  Evaluation beyond the lobar arteries is severely degraded. 2.  Small bilateral pleural effusions are worse on the right. 3.  Consolidated airspace disease in the posteromedial lower lobes bilaterally. 4.  Mild cardiomegaly and a small pericardial effusion.  *RADIOLOGY  REPORT*  Clinical Data:  Retroperitoneal hematoma  CT ABDOMEN AND PELVIS WITHOUT CONTRAST  Technique:  Multidetector CT imaging of the chest, abdomen and pelvis was performed following the standard protocol without IV contrast.  Comparison:   CTA abdomen pelvis 03/25/2012.  CT ABDOMEN AND PELVIS  Findings:  The liver and spleen are within normal limits. Postsurgical changes are again noted at the stomach.  A small hiatal hernia is present.  The duodenum and pancreas are within normal limits.  The common bile duct is within normal limits following cholecystectomy.  Sub centimeter lymph nodes at the porta hepatis are stable.  A subcapsular hematoma of the right kidney is stable.  The additional retroperitoneal hematoma is stable as well.  There is no evidence for new or expanding hemorrhage.  Hemorrhage extends into the retroperitoneal tissues of the pelvis.  The left kidney is atrophic.  The adrenal glands are normal bilaterally.  An IVC filter is in place.  The rectosigmoid colon is within normal limits.  The more proximal colon is unremarkable.  Contrast can be seen to the splenic flexure.  The appendix is visualized and normal.  Small bowel is unremarkable.  The peritoneal contents are displaced by the right retroperitoneal hematoma.  The bone windows demonstrate no focal lytic or blastic lesions.  No acute fractures are present.  IMPRESSION:  1.  Stable appearance of right subcapsular renal and retroperitoneal hematoma. 2.  No  evidence for new or expanding hemorrhage. 3.  Mass effect with displacement of the intraperitoneal contents. 4.  Atherosclerosis. 5.  Left renal atrophy. 6.  Status post cholecystectomy.   Original Report Authenticated By: Jamesetta Orleans. MATTERN, M.D.    Ct Angio Chest Pe W/cm &/or Wo Cm  03/28/2012  *RADIOLOGY REPORT*  Clinical Data:  Right lower quadrant pain shortness of breath.  CT ANGIOGRAPHY CHEST WITH CONTRAST  Technique:  Multidetector CT imaging of the chest was performed using the standard protocol during bolus administration of intravenous contrast.  Multiplanar CT image reconstructions including MIPs were obtained to evaluate the vascular anatomy.  Contrast: OMNIPAQUE IOHEXOL 350 MG/ML SOLN  Comparison:  CTA abdomen pelvis 03/25/2012.  Findings:  Pulmonary arterial opacification is satisfactory.  The bolus is suboptimal, degrading evaluation for pulmonary emboli in the segmental vessels.  No significant proximal embolus is present to at least the level of the lobe arteries.  The heart size is slightly enlarged.  A small pericardial effusion is present.  Small bilateral pleural effusions are noted.  No significant adenopathy is present.  The bilateral medial lower lobe airspace consolidation is present bilaterally.  Scattered areas of ground-glass attenuation present throughout both lungs.  Linear airspace consolidation is present in the medial left upper lobe.  There is posteromedial consolidation in the superior segment of the left lower lobe and within the upper lobe.  Review of the MIP images confirms the above findings.  IMPRESSION:  1.  No focal filling defects present to suggest a proximal pulmonary embolus.  Evaluation beyond the lobar arteries is severely degraded. 2.  Small bilateral pleural effusions are worse on the right. 3.  Consolidated airspace disease in the posteromedial lower lobes bilaterally. 4.  Mild cardiomegaly and a small pericardial effusion.  *RADIOLOGY REPORT*   Clinical Data:  Retroperitoneal hematoma  CT ABDOMEN AND PELVIS WITHOUT CONTRAST  Technique:  Multidetector CT imaging of the chest, abdomen and pelvis was performed following the standard protocol without IV contrast.  Comparison:   CTA abdomen pelvis 03/25/2012.  CT ABDOMEN  AND PELVIS  Findings:  The liver and spleen are within normal limits. Postsurgical changes are again noted at the stomach.  A small hiatal hernia is present.  The duodenum and pancreas are within normal limits.  The common bile duct is within normal limits following cholecystectomy.  Sub centimeter lymph nodes at the porta hepatis are stable.  A subcapsular hematoma of the right kidney is stable.  The additional retroperitoneal hematoma is stable as well.  There is no evidence for new or expanding hemorrhage.  Hemorrhage extends into the retroperitoneal tissues of the pelvis.  The left kidney is atrophic.  The adrenal glands are normal bilaterally.  An IVC filter is in place.  The rectosigmoid colon is within normal limits.  The more proximal colon is unremarkable.  Contrast can be seen to the splenic flexure.  The appendix is visualized and normal.  Small bowel is unremarkable.  The peritoneal contents are displaced by the right retroperitoneal hematoma.  The bone windows demonstrate no focal lytic or blastic lesions.  No acute fractures are present.  IMPRESSION:  1.  Stable appearance of right subcapsular renal and retroperitoneal hematoma. 2.  No evidence for new or expanding hemorrhage. 3.  Mass effect with displacement of the intraperitoneal contents. 4.  Atherosclerosis. 5.  Left renal atrophy. 6.  Status post cholecystectomy.   Original Report Authenticated By: Jamesetta Orleans. MATTERN, M.D.    Ct Angio Pelvis W/cm &/or Wo/cm  03/25/2012  *RADIOLOGY REPORT*  Clinical Data:  Nausea, vomiting, and diarrhea.  Hypotension.  CT ANGIOGRAPHY ABDOMEN AND PELVIS  Technique:  Multidetector CT imaging of the abdomen and pelvis was performed using  the standard protocol during bolus administration of intravenous contrast.  Multiplanar reconstructed images including MIPs were obtained and reviewed to evaluate the vascular anatomy.  Contrast: OMNIPAQUE IOHEXOL 350 MG/ML SOLN  Comparison:  11/18/2010  Findings:  There is a large subcapsular hematoma involving the right kidney with compression of the renal parenchyma.  There is hemorrhage and hematoma in the right pararenal fascia and fat and extending into the adjacent soft tissues, extending down along the pericolic gutter into the right pelvis.  The differential diagnosis includes a ruptured renal cell carcinoma versus laceration or renal arterial injury.  No renal hematoma measures about 13.5 x 11.4 cm transverse dimension.  NG tube in the stomach.  Apparent postoperative changes in the stomach.  The liver, spleen, contracted gallbladder, pancreas, left kidney, abdominal aorta, and retroperitoneal lymph nodes are unremarkable.  IVC filter.  The stomach, small bowel, and colon are not abnormally distended.  There is a small anterior abdominal wall hernia containing transverse colon without bowel distension.  No free air in the abdomen.  Pelvis:  Right pelvic hematoma extending down from the right kidney as previously described.  This appears to be extraperitoneal.  The bladder is decompressed.  The prostate gland is not enlarged.  No significant pelvic lymphadenopathy.  Degenerative changes in the spine.  Postoperative changes in the left hip.   Review of the MIP images confirms the above findings.  IMPRESSION: Large subcapsular and retroperitoneal hematoma involving the right kidney and pararenal tissues.  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  Findings were discussed with Dr. Lavella Lemons at 0536 hours on 03/25/2012.   Original Report Authenticated By: Marlon Pel, M.D.    Ct Angio Abdomen W/cm &/or Wo  Contrast  03/25/2012  *RADIOLOGY REPORT*  Clinical Data:  Nausea, vomiting, and diarrhea.  Hypotension.  CT ANGIOGRAPHY ABDOMEN AND PELVIS  Technique:  Multidetector CT imaging of the abdomen and pelvis was performed using the standard protocol during bolus administration of intravenous contrast.  Multiplanar reconstructed images including MIPs were obtained and reviewed to evaluate the vascular anatomy.  Contrast: OMNIPAQUE IOHEXOL 350 MG/ML SOLN  Comparison:  11/18/2010  Findings:  There is a large subcapsular hematoma involving the right kidney with compression of the renal parenchyma.  There is hemorrhage and hematoma in the right pararenal fascia and fat and extending into the adjacent soft tissues, extending down along the pericolic gutter into the right pelvis.  The differential diagnosis includes a ruptured renal cell carcinoma versus laceration or renal arterial injury.  No renal hematoma measures about 13.5 x 11.4 cm transverse dimension.  NG tube in the stomach.  Apparent postoperative changes in the stomach.  The liver, spleen, contracted gallbladder, pancreas, left kidney, abdominal aorta, and retroperitoneal lymph nodes are unremarkable.  IVC filter.  The stomach, small bowel, and colon are not abnormally distended.  There is a small anterior abdominal wall hernia containing transverse colon without bowel distension.  No free air in the abdomen.  Pelvis:  Right pelvic hematoma extending down from the right kidney as previously described.  This appears to be extraperitoneal.  The bladder is decompressed.  The prostate gland is not enlarged.  No significant pelvic lymphadenopathy.  Degenerative changes in the spine.  Postoperative changes in the left hip.   Review of the MIP images confirms the above findings.  IMPRESSION: Large subcapsular and retroperitoneal hematoma involving the right kidney and pararenal tissues.  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  Findings were discussed with Dr. Lavella Lemons at 0536 hours on 03/25/2012.   Original Report Authenticated By: Marlon Pel, M.D.    Dg Chest Port 1 View  03/28/2012  *RADIOLOGY REPORT*  Clinical Data: Central line placement.  PORTABLE CHEST - 1 VIEW  Comparison: 03/28/2012  Findings: Left central line is in place with the tip in the central left innominate vein.  No pneumothorax.  Cardiomegaly with vascular congestion.  Bibasilar atelectasis.  The suspect mild interstitial edema.  IMPRESSION: Left central line tip in the central left innominate vein.  No pneumothorax.  Continued bibasilar atelectasis and probable mild interstitial edema.   Original Report Authenticated By: Cyndie Chime, M.D.    Dg Chest Port 1 View  03/28/2012  *RADIOLOGY REPORT*  Clinical Data: Respiratory distress and chest pain.  Rule out pulmonary embolism.  PORTABLE CHEST - 1 VIEW  Comparison: 03/25/2012  Findings: Mildly degraded exam due to AP portable technique and patient body habitus.  The chin overlies the apices.  Moderate cardiomegaly, accentuated by low lung volumes.  Interval removal of right internal jugular line.  No right-sided pleural effusion.  Left pleural space poorly evaluated. No pneumothorax.  Mild interstitial edema, superimposed upon low lung volumes.  Linear opacity in the left apex is favored to represent atelectasis.  There is volume loss in the infrahilar regions bilaterally with patchy bibasilar airspace disease.  IMPRESSION:  1.  Cardiomegaly and mild interstitial edema.  This is superimposed upon low lung volumes. 2.  Volume loss in the infrahilar regions.  Airspace disease at the lung bases which could represent atelectasis. 3.  Multifactorial degradation, including patient body habitus and low-volume AP portable technique.   Original Report Authenticated By: Consuello Bossier, M.D.  Dg Chest Portable 1 View  03/25/2012  *RADIOLOGY REPORT*   Clinical Data: Central line placement.  PORTABLE CHEST - 1 VIEW  Comparison: 03/25/2012 at 0108 hours.  Findings: Interval placement of a right central venous catheter. The tip is projected over the right upper mediastinum and is directed somewhat towards the left.  This suggest possible location at the origin of the brachial cephalic vein.  Arterial placement is not excluded.  No pneumothorax.  No developing pleural fluid collections.  Normal heart size and pulmonary vascularity.  No focal airspace consolidation.  IMPRESSION: Right central venous catheter is in the right upper mediastinum directed towards the left, possibly representing location in the origin of the brachial cephalic vein although arterial placement is not excluded.   Original Report Authenticated By: Marlon Pel, M.D.    Dg Chest Portable 1 View  03/25/2012  *RADIOLOGY REPORT*  Clinical Data: Abdominal pain, nausea/vomiting/diarrhea  PORTABLE CHEST - 1 VIEW  Comparison: 08/24/2011  Findings: Cardiomegaly.  No frank interstitial edema.  Stable right infrahilar prominence, likely vascular.  No pleural effusion or pneumothorax.  IMPRESSION: Cardiomegaly.  No frank interstitial edema.   Original Report Authenticated By: Charline Bills, M.D.     Microbiology: No results found for this or any previous visit (from the past 240 hour(s)).   Labs: Basic Metabolic Panel:  Lab 04/06/12 4098 04/05/12 1346 04/04/12 2345 04/03/12 0750 04/02/12 0500  NA 140 139 140 133* 136  K 3.7 4.1 3.6 4.0 3.7  CL 102 101 103 98 100  CO2 31 26 29 26 29   GLUCOSE 87 162* 150* 115* 131*  BUN 29* 53* 46* 52* 33*  CREATININE 2.98* 4.90* 4.51* 4.95* 3.69*  CALCIUM 8.6 8.7 8.4 8.8 8.6  MG -- -- -- -- --  PHOS 3.2 4.6 4.2 4.6 3.7   Liver Function Tests:  Lab 04/06/12 0555 04/05/12 1346 04/04/12 2345 04/03/12 0750 04/02/12 0500 03/31/12 0447  AST -- -- -- -- 10 20  ALT -- -- -- -- 9 15  ALKPHOS -- -- -- -- 72 83  BILITOT -- -- -- -- 0.3 0.4    PROT -- -- -- -- 5.5* 6.2  ALBUMIN 2.2* 2.2* 2.0* 2.1* 1.8* --   No results found for this basename: LIPASE:5,AMYLASE:5 in the last 168 hours No results found for this basename: AMMONIA:5 in the last 168 hours CBC:  Lab 04/06/12 0555 04/05/12 1346 04/05/12 0433 04/03/12 0750 04/02/12 0500  WBC 12.4* 13.6* 11.5* 10.1 10.3  NEUTROABS -- -- -- -- --  HGB 8.1* 8.1* 8.0* 7.8* 7.4*  HCT 26.5* 26.5* 26.2* 24.7* 23.1*  MCV 103.5* 101.9* 102.7* 99.6 98.7  PLT 231 229 219 226 202   Cardiac Enzymes: No results found for this basename: CKTOTAL:5,CKMB:5,CKMBINDEX:5,TROPONINI:5 in the last 168 hours BNP: BNP (last 3 results) No results found for this basename: PROBNP:3 in the last 8760 hours CBG:  Lab 04/02/12 1112 04/02/12 0831 03/31/12 0014  GLUCAP 143* 108* 119*       Signed:  Marlo Arriola,[name]  [facility] 04/06/2012, 11:48 AM

## 2012-04-05 NOTE — Progress Notes (Signed)
Occupational Therapy Treatment Patient Details Name: Taivon Haroon MRN: [id number] DOB: [birth year] Today's Date: 04/05/2012 Time: 4098-1191 OT Time Calculation (min): 49 min  OT Assessment / Plan / Recommendation Comments on Treatment Session Pt still with increased abdominal pain in sitting, limiting his ability to stand and tolerate intense therapy.  Only needed supervision for transfer to EOB from supine today.    Follow Up Recommendations  No OT follow up       Equipment Recommendations  None recommended by OT       Frequency Min 2X/week   Plan Discharge plan remains appropriate    Precautions / Restrictions   morbid obesity, left foot drop, fall risk  Pertinent Vitals/Pain Pain 6/10 in his abdominal area, O2 sats 96% or greater during session    ADL  Transfers/Ambulation Related to ADLs: Pt did not transfer out of the bed today. ADL Comments: Pt transferred to EOB with close supervision and HOB elevated.  Able to sit EOB to perform UE exercises for bilateral UEs with min facilitation.  Performed 1 set each of shoulder flexion, and horizontal abduction, 10 repetitions each.  Pt needed min facilitation to complete reps using the blue theraband.  Feel he will benefit from attempting exercises using the lighter resistance orange band next session.      OT Goals Miscellaneous OT Goals OT Goal: Miscellaneous Goal #1 - Progress: Progressing toward goals OT Goal: Miscellaneous Goal #3 - Progress: Met  Visit Information  Last OT Received On: 04/05/12 Assistance Needed: +1 PT/OT Co-Evaluation/Treatment: Yes    Subjective Data  Subjective: "I don't feel like getting up, can we work on the side of the bed?" Patient Stated Goal: Wants to go home as soon as he can.   Prior Functioning       Cognition  Overall Cognitive Status: Appears within functional limits for tasks assessed/performed Arousal/Alertness: Awake/alert Orientation Level: Appears intact for tasks  assessed Behavior During Session: [facility] for tasks performed    Mobility  Shoulder Instructions Bed Mobility Bed Mobility: Supine to Sit;Sit to Supine Supine to Sit: 5: Supervision;HOB elevated;With rails Sitting - Scoot to Edge of Bed: 5: Supervision Sit to Supine: With rail;4: Min assist       Exercises      Balance Balance Balance Assessed: Yes Static Sitting Balance Static Sitting - Balance Support: No upper extremity supported Static Sitting - Level of Assistance: 5: Stand by assistance Dynamic Sitting Balance Dynamic Sitting - Balance Support: No upper extremity supported Dynamic Sitting - Level of Assistance: 5: Stand by assistance;Other (comment) (20 mins during exercises)   End of Session OT - End of Session Activity Tolerance: Patient limited by pain Patient left: in bed;with call bell/phone within reach;with nursing in room Nurse Communication: Mobility status  GO     Wylene Weissman 04/05/2012, 1:11 PM

## 2012-04-06 LAB — RENAL FUNCTION PANEL
Albumin: 2.2 g/dL — ABNORMAL LOW (ref 3.5–5.2)
Calcium: 8.6 mg/dL (ref 8.4–10.5)
Creatinine, Ser: 2.98 mg/dL — ABNORMAL HIGH (ref 0.50–1.35)
GFR calc Af Amer: 28 mL/min — ABNORMAL LOW (ref >90)
GFR calc non Af Amer: 24 mL/min — ABNORMAL LOW (ref >90)
Sodium: 140 mEq/L (ref 135–145)

## 2012-04-06 LAB — CBC
MCH: 31.6 pg (ref 26.0–34.0)
MCV: 103.5 fL — ABNORMAL HIGH (ref 78.0–100.0)
Platelets: 231 10*3/uL (ref 150–400)
RDW: 19.5 % — ABNORMAL HIGH (ref 11.5–15.5)
WBC: 12.4 10*3/uL — ABNORMAL HIGH (ref 4.0–10.5)

## 2012-04-06 MED ORDER — NEPRO/CARBSTEADY PO LIQD: 237.0000 mL | ORAL | Status: DC | PRN

## 2012-04-06 MED ORDER — OXYCODONE HCL 60 MG PO TB12: 60.0000 mg | ORAL_TABLET | Freq: Three times a day (TID) | ORAL | Status: DC

## 2012-04-06 MED ORDER — MIDODRINE HCL 10 MG PO TABS: 10.0000 mg | ORAL_TABLET | Freq: Three times a day (TID) | ORAL | Status: DC

## 2012-04-06 NOTE — Progress Notes (Signed)
[facility] ROUNDING NOTE   Subjective:   Interval History:resting comfortably, anemia due to Large subcapsular and retroperitoneal hematoma involving the right kidney and pararenal tissues      Objective:  Vital signs in last 24 hours:  Temp:  [96.6 F (35.9 C)-98.4 F (36.9 C)] 98.2 F (36.8 C) (11/08 0700) Pulse Rate:  [58-74] 63  (11/08 0700) Resp:  [13-22] 18  (11/08 0700) BP: (82-137)/(35-55) 103/35 mmHg (11/08 0700) SpO2:  [96 %-100 %] 99 % (11/08 0700) Weight:  [158.9 kg (350 lb 5 oz)-161.8 kg (356 lb 11.3 oz)] 159.9 kg (352 lb 8.3 oz) (11/08 0700)  Weight change:  Filed Weights   04/05/12 1325 04/05/12 1845 04/06/12 0700  Weight: 161.8 kg (356 lb 11.3 oz) 158.9 kg (350 lb 5 oz) 159.9 kg (352 lb 8.3 oz)    Intake/Output: I/O last 3 completed shifts: In: 840 [P.O.:840] Out: 3400 [Other:3400]   Intake/Output this shift:    Gen: chronically ill appearing, no distress, morbidly obese  HEENT: WNL  Neck: JVP low likely  Chest: Clear anteriorly, left mild coarse  Cardiac: RRR s M  Abd: Obese, soft, NT, NABS, no new abdo pain  Ext: warm, trace bilateral ankle edema     Basic Metabolic Panel:  Lab 04/06/12 2952 04/05/12 1346 04/04/12 2345 04/03/12 0750 04/02/12 0500  NA 140 139 140 133* 136  K 3.7 4.1 3.6 4.0 3.7  CL 102 101 103 98 100  CO2 31 26 29 26 29   GLUCOSE 87 162* 150* 115* 131*  BUN 29* 53* 46* 52* 33*  CREATININE 2.98* 4.90* 4.51* 4.95* 3.69*  CALCIUM 8.6 8.7 8.4 -- --  MG -- -- -- -- --  PHOS 3.2 4.6 4.2 4.6 3.7    Liver Function Tests:  Lab 04/06/12 0555 04/05/12 1346 04/04/12 2345 04/03/12 0750 04/02/12 0500 03/31/12 0447  AST -- -- -- -- 10 20  ALT -- -- -- -- 9 15  ALKPHOS -- -- -- -- 72 83  BILITOT -- -- -- -- 0.3 0.4  PROT -- -- -- -- 5.5* 6.2  ALBUMIN 2.2* 2.2* 2.0* 2.1* 1.8* --   No results found for this basename: LIPASE:5,AMYLASE:5 in the last 168 hours No results found for this basename: AMMONIA:3 in the last  168 hours  CBC:  Lab 04/06/12 0555 04/05/12 1346 04/05/12 0433 04/03/12 0750 04/02/12 0500  WBC 12.4* 13.6* 11.5* 10.1 10.3  NEUTROABS -- -- -- -- --  HGB 8.1* 8.1* 8.0* 7.8* 7.4*  HCT 26.5* 26.5* 26.2* 24.7* 23.1*  MCV 103.5* 101.9* 102.7* 99.6 98.7  PLT 231 229 219 226 202    Cardiac Enzymes: No results found for this basename: CKTOTAL:5,CKMB:5,CKMBINDEX:5,TROPONINI:5 in the last 168 hours  BNP: No components found with this basename: POCBNP:5  CBG:  Lab 04/02/12 1112 04/02/12 0831 03/31/12 0014  GLUCAP 143* 108* 119*    Microbiology: Results for orders placed during the hospital encounter of 03/25/12  CULTURE, BLOOD (ROUTINE X 2)     Status: Normal   Collection Time   03/25/12 12:50 AM      Component Value Range Status Comment   Specimen Description BLOOD IV START   Final    Special Requests BOTTLES DRAWN AEROBIC AND ANAEROBIC [facility] EACH   Final    Culture  Setup Time 03/25/2012 17:15   Final    Culture NO GROWTH 5 DAYS   Final    Report Status 03/31/2012 FINAL   Final   CULTURE, BLOOD (ROUTINE X  2)     Status: Normal   Collection Time   03/25/12  1:25 AM      Component Value Range Status Comment   Specimen Description BLOOD RIGHT ARM   Final    Special Requests BOTTLES DRAWN AEROBIC ONLY 5CC   Final    Culture  Setup Time 03/25/2012 17:15   Final    Culture NO GROWTH 5 DAYS   Final    Report Status 03/31/2012 FINAL   Final   MRSA PCR SCREENING     Status: Normal   Collection Time   03/25/12  9:00 AM      Component Value Range Status Comment   MRSA by PCR NEGATIVE  NEGATIVE Final     Coagulation Studies: No results found for this basename: LABPROT:5,INR:5 in the last 72 hours  Urinalysis: No results found for this basename: COLORURINE:2,APPERANCEUR:2,LABSPEC:2,PHURINE:2,GLUCOSEU:2,HGBUR:2,BILIRUBINUR:2,KETONESUR:2,PROTEINUR:2,UROBILINOGEN:2,NITRITE:2,LEUKOCYTESUR:2 in the last 72 hours    Imaging: Ct Abdomen Pelvis Wo Contrast  04/05/2012  *RADIOLOGY  REPORT*  Clinical Data: Follow-up retroperitoneal hematoma.  CT ABDOMEN AND PELVIS WITHOUT CONTRAST  Technique:  Multidetector CT imaging of the abdomen and pelvis was performed following the standard protocol without intravenous contrast.  Comparison: 03/28/2012.  Findings: Persistent small bilateral pleural effusions and bibasilar atelectasis. A small amount of pericardial fluid is stable.  The large perinephric and retroperitoneal hematoma is stable.  No new hemorrhages seen.  Stable surgical changes involving the stomach.  Stable splenomegaly.  IMPRESSION: Persistent large right perinephric and retroperitoneal hematoma. Small bilateral pleural effusions and bibasilar atelectasis.   Original Report Authenticated By: Rudie Meyer, M.D.      Medications:        . darbepoetin (ARANESP) injection - DIALYSIS  200 mcg Intravenous Q Tue-HD  . escitalopram  20 mg Oral Daily  . ferric gluconate (FERRLECIT/NULECIT) IV  125 mg Intravenous Q T,Th,Sa-HD  . midodrine  10 mg Oral TID WC  . multivitamin  1 tablet Oral QHS  . OxyCODONE  60 mg Oral Q8H  . pantoprazole  40 mg Oral Q1200  . predniSONE  10 mg Oral Q breakfast  . sodium chloride  10-40 mL Intracatheter Q12H  . [DISCONTINUED] albuterol  2.5 mg Nebulization TID   sodium chloride, albuterol, ALPRAZolam, feeding supplement (NEPRO CARB STEADY), feeding supplement (NEPRO CARB STEADY), heparin, heparin, lidocaine, lidocaine, lidocaine-prilocaine, lidocaine-prilocaine, morphine injection, ondansetron (ZOFRAN) IV, pentafluoroprop-tetrafluoroeth, pentafluoroprop-tetrafluoroeth, sodium chloride, [DISCONTINUED] albuterol  Assessment/ Plan:  [name] with ESRD, chronic pain and h/o DVT who presented to the ED 10/27 with RLQ pain, hypotension, anemia due to Large subcapsular and retroperitoneal hematoma involving the right kidney and pararenal tissues (based on CT abd). Received 4 units PRBCs and FFP. Admitted by PCCM to ICU. BP much improved AM 10/28 and  transferred to Renal floor. Returned to ICU on 10/30 for hypotension.   1.ESRD TTS dialysis   2. Retroperitoneal hematoma Stable   3. Hypotension Midodrine 10 mg TID   4. Anemia. 200 mcg Aranesp and Iron   No dialysis planned today. Will plan HD in AM no heparin   LOS: 12 [name] @TODAY @10 :01 AM

## 2012-04-06 NOTE — Care Management Note (Signed)
    Page 1 of 2   04/06/2012     2:37:00 PM   CARE MANAGEMENT NOTE 04/06/2012  Patient:  [name], [name]   Account Number:  [id number]  Date Initiated:  03/28/2012  Documentation initiated by:  Lib[place]as [place]  Subjective/Objective Assessment:   retroperitoneal bleed.  hypotensive.     Action/Plan:   PTA, PT INDEPENDENT, LIVES WITH SPOUSE.  PT USES MOTORIZED SCOOTER MOST OF THE TIME, WITH SLIDE BOARD TO TRANSFER.   Anticipated DC Date:  04/04/2012   Anticipated DC Plan:  HOME W HOME HEALTH SERVICES      DC Planning Services  CM consult      [facility] Choice  HOME HEALTH   Choice offered to / List presented to:  C-1 Patient        HH arranged  HH-2 PT      [facility] agency  [facility]   Status of service:  Completed, signed off Medicare Important Message given?   (If response is "NO", the following Medicare IM given date fields will be blank) Date Medicare IM given:   Date Additional Medicare IM given:    Discharge Disposition:  HOME W HOME HEALTH SERVICES  Per UR Regulation:  Reviewed for med. necessity/level of care/duration of stay  If discussed at Long Length of Stay Meetings, dates discussed:   04/05/2012    Comments:  ContactOjas, [place] Spouse [phone]   [place],Cammie Sister [phone]  04/06/12 Dioselina Brumbaugh,RN,BSN [phone] PT FOR DISCHARGE TODAY.  NEEDS HOME HEALTH FOLLOW UP; REFERRAL TO [facility], PER PT CHOICE.  START OF CARE 24-48H POST DC DATE.  DENIES ADDITIONAL NEEDS AT HOME.  04-02-12 10am Avie Arenas, RNBSN [phone] Plan for tx out ofICU today.  Discussed Discharge options with patient - refused Ltach or rehab, states gets along fine at home with w/c and slide board.  Transports self to dialysis.  Does not have any doubt that he will be able to continue this. Had attentive wife and children that do assist him.  Uses [facility] at home when needed.  Will need orders for [facility] on discharge.   03-28-12 1:30pm Avie Arenas, RNBSN [phone] tx to ICU  due to hypotension.

## 2012-04-06 NOTE — Progress Notes (Signed)
Morphine I.V. Given for lower back pain and for rlq pain "Hurts" , pt. Stated . Recheck patient and stated did not help any. Gave him his xanax and 10 p.m. oxycotin Patient went to sleep. Explain to patient I was told he was going home tomorrow and needed to cut back on Morphine and he was in agreement at this time.Marland Kitchen

## 2012-04-06 NOTE — Progress Notes (Signed)
Pt for discharge home today with home health PT per [facility], IJ  D/C, catheter intact and dressed with vasoline gauze and pressure held for 5 minutes after removal.  Pt instructed to lay flat for 30 minutes after removal, but pt left after approximately 20 minutes instead.  D/C instructions and Rx given with verbalized understanding.  Family at bedside to assist pt with discharge.Pt left via personal motorized wheelchair.

## 2012-04-06 NOTE — Progress Notes (Signed)
Awaken patient to put back on tele.  Gave him xanax for rest.

## 2012-04-06 NOTE — Progress Notes (Signed)
Patient was awaken to be put back on tele. Patient had been sleeping comfort. All night. And when awaken he immed. Ask for morphine . Went to get him xanax to help him rest and he had follow asleep stood at door and he did not awaken. Will cont. To monitor pt.

## 2012-06-04 ENCOUNTER — Inpatient Hospital Stay (HOSPITAL_COMMUNITY)
Admission: EM | Admit: 2012-06-04 | Discharge: 2012-06-12 | DRG: 871 | Disposition: A | Discharge status: Home Health | Payer: Medicare Other | Attending: Internal Medicine | Admitting: Internal Medicine

## 2012-06-04 ENCOUNTER — Emergency Department (HOSPITAL_COMMUNITY): Payer: Medicare Other

## 2012-06-04 ENCOUNTER — Encounter (HOSPITAL_COMMUNITY): Payer: Self-pay | Admitting: *Deleted

## 2012-06-04 DIAGNOSIS — Z6841 Body Mass Index (BMI) 40.0 and over, adult: Secondary | ICD-10-CM

## 2012-06-04 DIAGNOSIS — R6521 Severe sepsis with septic shock: Secondary | ICD-10-CM | POA: Diagnosis present

## 2012-06-04 DIAGNOSIS — A419 Sepsis, unspecified organism: Principal | ICD-10-CM | POA: Diagnosis present

## 2012-06-04 DIAGNOSIS — E119 Type 2 diabetes mellitus without complications: Secondary | ICD-10-CM | POA: Diagnosis present

## 2012-06-04 DIAGNOSIS — D72829 Elevated white blood cell count, unspecified: Secondary | ICD-10-CM

## 2012-06-04 DIAGNOSIS — G894 Chronic pain syndrome: Secondary | ICD-10-CM | POA: Diagnosis present

## 2012-06-04 DIAGNOSIS — R4182 Altered mental status, unspecified: Secondary | ICD-10-CM | POA: Diagnosis present

## 2012-06-04 DIAGNOSIS — K7200 Acute and subacute hepatic failure without coma: Secondary | ICD-10-CM

## 2012-06-04 DIAGNOSIS — E872 Acidosis, unspecified: Secondary | ICD-10-CM | POA: Diagnosis present

## 2012-06-04 DIAGNOSIS — Z992 Dependence on renal dialysis: Secondary | ICD-10-CM

## 2012-06-04 DIAGNOSIS — H9190 Unspecified hearing loss, unspecified ear: Secondary | ICD-10-CM | POA: Diagnosis present

## 2012-06-04 DIAGNOSIS — G4733 Obstructive sleep apnea (adult) (pediatric): Secondary | ICD-10-CM | POA: Diagnosis present

## 2012-06-04 DIAGNOSIS — Z86718 Personal history of other venous thrombosis and embolism: Secondary | ICD-10-CM

## 2012-06-04 DIAGNOSIS — D649 Anemia, unspecified: Secondary | ICD-10-CM

## 2012-06-04 DIAGNOSIS — N186 End stage renal disease: Secondary | ICD-10-CM | POA: Diagnosis present

## 2012-06-04 DIAGNOSIS — J15211 Pneumonia due to Methicillin susceptible Staphylococcus aureus: Secondary | ICD-10-CM | POA: Diagnosis present

## 2012-06-04 DIAGNOSIS — I120 Hypertensive chronic kidney disease with stage 5 chronic kidney disease or end stage renal disease: Secondary | ICD-10-CM | POA: Diagnosis present

## 2012-06-04 DIAGNOSIS — H9319 Tinnitus, unspecified ear: Secondary | ICD-10-CM | POA: Diagnosis present

## 2012-06-04 DIAGNOSIS — J9600 Acute respiratory failure, unspecified whether with hypoxia or hypercapnia: Secondary | ICD-10-CM | POA: Diagnosis present

## 2012-06-04 DIAGNOSIS — K219 Gastro-esophageal reflux disease without esophagitis: Secondary | ICD-10-CM | POA: Diagnosis present

## 2012-06-04 DIAGNOSIS — F17200 Nicotine dependence, unspecified, uncomplicated: Secondary | ICD-10-CM | POA: Diagnosis present

## 2012-06-04 LAB — COMPREHENSIVE METABOLIC PANEL
ALT: 1654 U/L — ABNORMAL HIGH (ref 0–53)
AST: 2606 U/L — ABNORMAL HIGH (ref 0–37)
Albumin: 3 g/dL — ABNORMAL LOW (ref 3.5–5.2)
Alkaline Phosphatase: 120 U/L — ABNORMAL HIGH (ref 39–117)
BUN: 43 mg/dL — ABNORMAL HIGH (ref 6–23)
Potassium: 4.2 mEq/L (ref 3.5–5.1)
Sodium: 140 mEq/L (ref 135–145)
Total Protein: 7.7 g/dL (ref 6.0–8.3)

## 2012-06-04 LAB — URINALYSIS, ROUTINE W REFLEX MICROSCOPIC
Glucose, UA: NEGATIVE mg/dL
Ketones, ur: 15 mg/dL — AB
pH: 5.5 (ref 5.0–8.0)

## 2012-06-04 LAB — CBC WITH DIFFERENTIAL/PLATELET
Basophils Absolute: 0 10*3/uL (ref 0.0–0.1)
Basophils Relative: 0 % (ref 0–1)
Eosinophils Absolute: 0 10*3/uL (ref 0.0–0.7)
MCH: 30.7 pg (ref 26.0–34.0)
MCHC: 30.7 g/dL (ref 30.0–36.0)
Neutrophils Relative %: 92 % — ABNORMAL HIGH (ref 43–77)
Platelets: 223 10*3/uL (ref 150–400)
RDW: 16.9 % — ABNORMAL HIGH (ref 11.5–15.5)

## 2012-06-04 LAB — URINE MICROSCOPIC-ADD ON

## 2012-06-04 NOTE — ED Provider Notes (Signed)
History     CSN: [id number]  Arrival date & time 06/04/12  2042   First MD Initiated Contact with Patient 06/04/12 2056      No chief complaint on file.   (Consider location/radiation/quality/duration/timing/severity/associated sxs/prior treatment) HPI  [name] is a 47 y.o. male complaining of acute onset of loss of hearing earlier in the day. Patient also has altered mental status as per his wife. As per his wife he has been afebrile, not complaining of pain, not coughing.   Past Medical History  Diagnosis Date  . Hypertension   . GERD (gastroesophageal reflux disease)   . DVT (deep venous thrombosis)   . Renal disorder   . Complication of anesthesia     " I am hard  to wake up "  . Diabetes mellitus   . ESRD on hemodialysis     Dialysis at [facility] in [place], [place]. Started dialysis in 2010. ESRD due to DM/HTN.    Past Surgical History  Procedure Date  . Gastric sleeve   . Kidney stones   . Dialysis port   . Compression hip screw 08/26/2011    Procedure: COMPRESSION HIP;  Surgeon: Velna Ochs, MD;  Location: [facility] OR;  Service: Orthopedics;  Laterality: Left;  DYNAMIC HIP SCREW COMPRESSION  . Ivc filter   . Cholecystostomy tube     No family history on file.  History  Substance Use Topics  . Smoking status: Current Every Day Smoker    Types: Cigarettes  . Smokeless tobacco: Never Used  . Alcohol Use: No      Review of Systems  Unable to perform ROS: Mental status change    Allergies  Piperacillin sod-tazobactam so  Home Medications   Current Outpatient Rx  Name  Route  Sig  Dispense  Refill  . ALPRAZOLAM 1 MG PO TABS   Oral   Take 1 mg by mouth every 6 (six) hours as needed. anxiety         . CARISOPRODOL 350 MG PO TABS   Oral   Take 350 mg by mouth 3 (three) times daily.         . EPOETIN ALFA 40981 UNIT/ML IJ SOLN   Subcutaneous   Inject 6,500 Units into the skin 3 (three) times a week. Receives at [facility].  Last visit 08/23/11         . ESCITALOPRAM OXALATE 20 MG PO TABS   Oral   Take 20 mg by mouth daily.         Marland Kitchen MIDODRINE HCL 10 MG PO TABS   Oral   Take 1 tablet (10 mg total) by mouth 3 (three) times daily with meals.   90 tablet   0   . RENA-VITE PO TABS   Oral   Take 1 tablet by mouth daily.         Marland Kitchen NEPRO/CARB STEADY PO LIQD   Oral   Take 237 mLs by mouth as needed (missed meal during dialysis.).   30 Can   0   . OMEPRAZOLE 20 MG PO CPDR   Oral   Take 20 mg by mouth 2 (two) times daily.         . OXYCODONE HCL ER 60 MG PO TB12   Oral   Take 1 tablet (60 mg total) by mouth every 8 (eight) hours.   21 tablet   0   . SEVELAMER CARBONATE 800 MG PO TABS   Oral   Take 800-1,600  mg by mouth See admin instructions. Two capsules with each meal, and one capsule with each snack           BP 111/72  Pulse 100  Temp 98.3 F (36.8 C) (Oral)  Resp 18  SpO2 90%  Physical Exam  Nursing note and vitals reviewed. Constitutional: He is oriented to person, place, and time. He appears well-developed and well-nourished. No distress.       Morbidly obese, somnolent  HENT:  Head: Normocephalic and atraumatic.  Mouth/Throat: Oropharynx is clear and moist.  Eyes: Conjunctivae normal and EOM are normal. Pupils are equal, round, and reactive to light.  Neck: Normal range of motion.  Cardiovascular: Normal rate, regular rhythm and intact distal pulses.   Pulmonary/Chest: Effort normal. No stridor. No respiratory distress. He has no wheezes. He has no rales. He exhibits no tenderness.       Bilateral crackles at the bases  Abdominal: Soft. Bowel sounds are normal. He exhibits no distension and no mass. There is no tenderness. There is no rebound and no guarding.  Musculoskeletal: Normal range of motion.  Neurological: He is alert and oriented to person, place, and time.       Patient is following commands and communicates via writing.  Psychiatric: He has a normal mood and  affect.    ED Course  Procedures (including critical care time)  Labs Reviewed  CBC WITH DIFFERENTIAL - Abnormal; Notable for the following:    WBC 23.1 (*)     RBC 3.45 (*)     Hemoglobin 10.6 (*)     HCT 34.5 (*)     RDW 16.9 (*)     Neutrophils Relative 92 (*)     Neutro Abs 21.3 (*)     Lymphocytes Relative 6 (*)     Monocytes Relative 2 (*)     All other components within normal limits  COMPREHENSIVE METABOLIC PANEL - Abnormal; Notable for the following:    Glucose, Bld 143 (*)     BUN 43 (*)     Creatinine, Ser 6.19 (*)     Albumin 3.0 (*)     AST 2606 (*)     ALT 1654 (*)     Alkaline Phosphatase 120 (*)     GFR calc non Af Amer 10 (*)     GFR calc Af Amer 11 (*)     All other components within normal limits  URINALYSIS, ROUTINE W REFLEX MICROSCOPIC - Abnormal; Notable for the following:    Color, Urine AMBER (*)  BIOCHEMICALS MAY BE AFFECTED BY COLOR   APPearance CLOUDY (*)     Hgb urine dipstick MODERATE (*)     Bilirubin Urine SMALL (*)     Ketones, ur 15 (*)     Protein, ur >300 (*)     Leukocytes, UA MODERATE (*)     All other components within normal limits  CBC WITH DIFFERENTIAL - Abnormal; Notable for the following:    WBC 22.1 (*)     RBC 3.36 (*)     Hemoglobin 10.3 (*)     HCT 33.5 (*)     RDW 16.9 (*)     Neutrophils Relative 91 (*)     Neutro Abs 20.1 (*)     Lymphocytes Relative 7 (*)     Monocytes Relative 2 (*)     All other components within normal limits  AMMONIA - Abnormal; Notable for the following:    Ammonia 68 (*)  All other components within normal limits  URINE MICROSCOPIC-ADD ON - Abnormal; Notable for the following:    Squamous Epithelial / LPF MANY (*)     All other components within normal limits  SALICYLATE LEVEL - Abnormal; Notable for the following:    Salicylate Lvl <2.0 (*)     All other components within normal limits  LIPASE, BLOOD - Abnormal; Notable for the following:    Lipase 76 (*)     All other components  within normal limits  PROTIME-INR - Abnormal; Notable for the following:    Prothrombin Time 15.8 (*)     All other components within normal limits  ETHANOL  ACETAMINOPHEN LEVEL  URINE RAPID DRUG SCREEN (HOSP PERFORMED)  CULTURE, BLOOD (ROUTINE X 2)  CULTURE, BLOOD (ROUTINE X 2)   Dg Chest 2 View  06/04/2012  *RADIOLOGY REPORT*  Clinical Data: Rhonchi.  Altered level of consciousness.  CHEST - 2 VIEW  Comparison: 03/28/2012.  Findings: Cardiomegaly.  Bilateral right greater than left perihilar opacities, likely vascular.  Early congestive heart failure.  Bilateral perihilar infiltrates felt less likely. Negative osseous structures.  Worsening aeration compared with priors.  IMPRESSION: Cardiomegaly with probable early CHF.   Original Report Authenticated By: Davonna Belling, M.D.    Ct Head Wo Contrast  06/04/2012  *RADIOLOGY REPORT*  Clinical Data: Severe headache.  Altered mental status.  End-stage renal disease.  Morbid obesity.  CT HEAD WITHOUT CONTRAST  Technique:  Contiguous axial images were obtained from the base of the skull through the vertex without contrast.  Comparison: None.  Findings: The patient had difficulty remaining motionless for the study.  Images are suboptimal.  Small or subtle lesions could be overlooked. There is no evidence for acute infarction, intracranial hemorrhage, mass lesion, hydrocephalus, or extra-axial fluid.  Mild hyperostosis.  Slight atrophy.  No definite white matter disease. Clear sinuses and mastoids.  Negative orbits.  IMPRESSION: Motion degraded exam.  Slight atrophy is suspected.  No acute intracranial findings.   Original Report Authenticated By: Davonna Belling, M.D.      No diagnosis found.    MDM  Patient with ESRD and diabetes presenting with altered mental status. Patient is a leukocytosis of 23  LFTs are significantly elevated also it appears the patient has a urinary tract infection.  Case signed out to Dr. Norlene Campbell at shift  Change         Wynetta Emery, PA-C 06/05/12 [phone]

## 2012-06-04 NOTE — ED Notes (Signed)
Pt. Alert and oriented x4. States "I feel like I can hear better but it's just fuzzy".

## 2012-06-04 NOTE — ED Notes (Signed)
Patient transported to X-ray and CT 

## 2012-06-04 NOTE — ED Notes (Addendum)
Per EMS:  Pt called EMS due to decreased hearing and tinnitus.  Pt st's this started this afternoon, pt has been in bed all day long but that is normal for him - pt woke up and tried to walk which he normally doesn't do and pt started calling EMS his wife's name, pt is now alert and oriented x3, EMS st's no signs of stroke but L pupil was smaller than right but was still reactive .  Pt st's he can hear some but can't understand due to the tinnitus and this is pt's main concern.  EMS st's they auscultated expiratory rales but pt is not SOB.

## 2012-06-05 ENCOUNTER — Inpatient Hospital Stay (HOSPITAL_COMMUNITY): Payer: Medicare Other

## 2012-06-05 ENCOUNTER — Encounter (HOSPITAL_COMMUNITY): Payer: Self-pay

## 2012-06-05 ENCOUNTER — Emergency Department (HOSPITAL_COMMUNITY): Payer: Medicare Other

## 2012-06-05 DIAGNOSIS — N186 End stage renal disease: Secondary | ICD-10-CM

## 2012-06-05 DIAGNOSIS — R4182 Altered mental status, unspecified: Secondary | ICD-10-CM

## 2012-06-05 DIAGNOSIS — A419 Sepsis, unspecified organism: Secondary | ICD-10-CM | POA: Diagnosis present

## 2012-06-05 DIAGNOSIS — G4733 Obstructive sleep apnea (adult) (pediatric): Secondary | ICD-10-CM

## 2012-06-05 DIAGNOSIS — J9600 Acute respiratory failure, unspecified whether with hypoxia or hypercapnia: Secondary | ICD-10-CM | POA: Diagnosis present

## 2012-06-05 DIAGNOSIS — H9190 Unspecified hearing loss, unspecified ear: Secondary | ICD-10-CM | POA: Diagnosis present

## 2012-06-05 DIAGNOSIS — R7401 Elevation of levels of liver transaminase levels: Secondary | ICD-10-CM | POA: Diagnosis present

## 2012-06-05 LAB — POCT I-STAT 3, ART BLOOD GAS (G3+)
Acid-base deficit: 7 mmol/L — ABNORMAL HIGH (ref 0.0–2.0)
Bicarbonate: 25.3 mEq/L — ABNORMAL HIGH (ref 20.0–24.0)
O2 Saturation: 92 %
O2 Saturation: 96 %
O2 Saturation: 91 %
O2 Saturation: 99 %
Patient temperature: 98.3
TCO2: 25 mmol/L (ref 0–100)
TCO2: 24 mmol/L (ref 0–100)
pCO2 arterial: 66.5 mmHg (ref 35.0–45.0)
pCO2 arterial: 65.3 mmHg (ref 35.0–45.0)
pCO2 arterial: 70.6 mmHg (ref 35.0–45.0)
pO2, Arterial: 78 mmHg — ABNORMAL LOW (ref 80.0–100.0)
pO2, Arterial: 99 mmHg (ref 80.0–100.0)

## 2012-06-05 LAB — ETHANOL: Alcohol, Ethyl (B): <11 mg/dL (ref 0–11)

## 2012-06-05 LAB — CBC WITH DIFFERENTIAL/PLATELET
Basophils Absolute: 0 10*3/uL (ref 0.0–0.1)
Basophils Relative: 0 % (ref 0–1)
HCT: 33.5 % — ABNORMAL LOW (ref 39.0–52.0)
MCHC: 30.7 g/dL (ref 30.0–36.0)
Monocytes Absolute: 0.5 10*3/uL (ref 0.1–1.0)
Neutro Abs: 20.1 10*3/uL — ABNORMAL HIGH (ref 1.7–7.7)
Neutrophils Relative %: 91 % — ABNORMAL HIGH (ref 43–77)
Platelets: 237 10*3/uL (ref 150–400)
RDW: 16.9 % — ABNORMAL HIGH (ref 11.5–15.5)
WBC: 22.1 10*3/uL — ABNORMAL HIGH (ref 4.0–10.5)

## 2012-06-05 LAB — GLUCOSE, CAPILLARY
Glucose-Capillary: 136 mg/dL — ABNORMAL HIGH (ref 70–99)
Glucose-Capillary: 112 mg/dL — ABNORMAL HIGH (ref 70–99)

## 2012-06-05 LAB — AMMONIA: Ammonia: 68 umol/L — ABNORMAL HIGH (ref 11–60)

## 2012-06-05 LAB — PROTIME-INR
INR: 1.29 (ref 0.00–1.49)
Prothrombin Time: 15.8 seconds — ABNORMAL HIGH (ref 11.6–15.2)

## 2012-06-05 LAB — CBC
HCT: 30.9 % — ABNORMAL LOW (ref 39.0–52.0)
MCH: 30.7 pg (ref 26.0–34.0)
MCHC: 30.7 g/dL (ref 30.0–36.0)
MCV: 100 fL (ref 78.0–100.0)
RDW: 16.9 % — ABNORMAL HIGH (ref 11.5–15.5)

## 2012-06-05 LAB — CARBOXYHEMOGLOBIN
Methemoglobin: 1.6 % — ABNORMAL HIGH (ref 0.0–1.5)
O2 Saturation: 87.5 %
Total hemoglobin: 9.7 g/dL — ABNORMAL LOW (ref 13.5–18.0)

## 2012-06-05 LAB — RENAL FUNCTION PANEL
Albumin: 2.7 g/dL — ABNORMAL LOW (ref 3.5–5.2)
BUN: 47 mg/dL — ABNORMAL HIGH (ref 6–23)
Calcium: 8.5 mg/dL (ref 8.4–10.5)
Chloride: 99 mEq/L (ref 96–112)
Creatinine, Ser: 6.03 mg/dL — ABNORMAL HIGH (ref 0.50–1.35)
Phosphorus: 5.5 mg/dL — ABNORMAL HIGH (ref 2.3–4.6)

## 2012-06-05 LAB — COMPREHENSIVE METABOLIC PANEL
Albumin: 2.7 g/dL — ABNORMAL LOW (ref 3.5–5.2)
BUN: 48 mg/dL — ABNORMAL HIGH (ref 6–23)
Calcium: 8.5 mg/dL (ref 8.4–10.5)
Chloride: 100 mEq/L (ref 96–112)
Creatinine, Ser: 6.21 mg/dL — ABNORMAL HIGH (ref 0.50–1.35)
Total Bilirubin: 0.3 mg/dL (ref 0.3–1.2)
Total Protein: 6.8 g/dL (ref 6.0–8.3)

## 2012-06-05 LAB — TROPONIN I
Troponin I: 1.44 ng/mL (ref <0.30)
Troponin I: 0.91 ng/mL (ref <0.30)

## 2012-06-05 LAB — ACETAMINOPHEN LEVEL: Acetaminophen (Tylenol), Serum: <15 ug/mL (ref 10–30)

## 2012-06-05 LAB — LACTIC ACID, PLASMA: Lactic Acid, Venous: 1.6 mmol/L (ref 0.5–2.2)

## 2012-06-05 LAB — MRSA PCR SCREENING: MRSA by PCR: NEGATIVE

## 2012-06-05 MED ORDER — ONDANSETRON HCL 4 MG/2ML IJ SOLN
INTRAMUSCULAR | Status: AC
  Filled 2012-06-05: qty 2

## 2012-06-05 MED ORDER — CIPROFLOXACIN IN D5W 400 MG/200ML IV SOLN: 400.0000 mg | INTRAVENOUS | Status: DC

## 2012-06-05 MED ORDER — OXYCODONE HCL ER 40 MG PO T12A
60.0000 mg | EXTENDED_RELEASE_TABLET | Freq: Three times a day (TID) | ORAL | Status: DC
  Administered 2012-06-05 – 2012-06-08 (×6): 60 mg via ORAL
  Filled 2012-06-05 (×6): qty 1

## 2012-06-05 MED ORDER — VANCOMYCIN HCL 10 G IV SOLR
1500.0000 mg | INTRAVENOUS | Status: DC
  Administered 2012-06-06 – 2012-06-07 (×2): 1500 mg via INTRAVENOUS
  Filled 2012-06-05 (×4): qty 1500

## 2012-06-05 MED ORDER — SEVELAMER CARBONATE 800 MG PO TABS
800.0000 mg | ORAL_TABLET | ORAL | Status: DC | PRN
  Filled 2012-06-05: qty 1

## 2012-06-05 MED ORDER — ALBUTEROL SULFATE HFA 108 (90 BASE) MCG/ACT IN AERS
6.0000 | INHALATION_SPRAY | RESPIRATORY_TRACT | Status: DC | PRN
  Filled 2012-06-05: qty 6.7

## 2012-06-05 MED ORDER — SEVELAMER CARBONATE 800 MG PO TABS: 800.0000 mg | ORAL_TABLET | ORAL | Status: DC

## 2012-06-05 MED ORDER — VANCOMYCIN HCL IN DEXTROSE 1-5 GM/200ML-% IV SOLN
1000.0000 mg | Freq: Once | INTRAVENOUS | Status: AC
  Administered 2012-06-05: 1000 mg via INTRAVENOUS
  Filled 2012-06-05: qty 200

## 2012-06-05 MED ORDER — NOREPINEPHRINE BITARTRATE 1 MG/ML IJ SOLN
2.0000 ug/min | INTRAVENOUS | Status: DC | PRN
  Administered 2012-06-05: 10 ug/min via INTRAVENOUS
  Filled 2012-06-05 (×2): qty 16

## 2012-06-05 MED ORDER — CIPROFLOXACIN IN D5W 400 MG/200ML IV SOLN
400.0000 mg | Freq: Once | INTRAVENOUS | Status: AC
  Administered 2012-06-05: 400 mg via INTRAVENOUS
  Filled 2012-06-05: qty 200

## 2012-06-05 MED ORDER — PHENYLEPHRINE HCL 10 MG/ML IJ SOLN
30.0000 ug/min | INTRAVENOUS | Status: DC
  Administered 2012-06-05: 20 ug/min via INTRAVENOUS
  Filled 2012-06-05: qty 1

## 2012-06-05 MED ORDER — PRISMASOL BGK 4/2.5 32-4-2.5 MEQ/L IV SOLN
INTRAVENOUS | Status: DC
  Administered 2012-06-05 – 2012-06-06 (×4): via INTRAVENOUS_CENTRAL
  Filled 2012-06-05 (×33): qty 5000

## 2012-06-05 MED ORDER — VANCOMYCIN HCL 10 G IV SOLR
1500.0000 mg | Freq: Once | INTRAVENOUS | Status: AC
  Administered 2012-06-05: 1500 mg via INTRAVENOUS
  Filled 2012-06-05: qty 1500

## 2012-06-05 MED ORDER — NEPRO/CARBSTEADY PO LIQD: 237.0000 mL | ORAL | Status: DC | PRN

## 2012-06-05 MED ORDER — SODIUM CHLORIDE 0.9 % IV SOLN
INTRAVENOUS | Status: DC
  Administered 2012-06-05: 20 mL/h via INTRAVENOUS
  Administered 2012-06-09: 20 mL via INTRAVENOUS

## 2012-06-05 MED ORDER — ROCURONIUM BROMIDE 50 MG/5ML IV SOLN
50.0000 mg | Freq: Once | INTRAVENOUS | Status: AC
  Administered 2012-06-05: 50 mg via INTRAVENOUS
  Filled 2012-06-05: qty 5

## 2012-06-05 MED ORDER — ESCITALOPRAM OXALATE 20 MG PO TABS
20.0000 mg | ORAL_TABLET | Freq: Every day | ORAL | Status: DC
  Administered 2012-06-06 – 2012-06-12 (×6): 20 mg via ORAL
  Filled 2012-06-05 (×8): qty 1

## 2012-06-05 MED ORDER — SODIUM CHLORIDE 0.9 % IJ SOLN
3.0000 mL | Freq: Two times a day (BID) | INTRAMUSCULAR | Status: DC
  Administered 2012-06-05 – 2012-06-12 (×5): 3 mL via INTRAVENOUS

## 2012-06-05 MED ORDER — PRISMASOL BGK 4/2.5 32-4-2.5 MEQ/L IV SOLN
INTRAVENOUS | Status: DC
  Administered 2012-06-05: 14:00:00 via INTRAVENOUS_CENTRAL
  Filled 2012-06-05 (×4): qty 5000

## 2012-06-05 MED ORDER — OXYCODONE HCL 60 MG PO TB12: 60.0000 mg | ORAL_TABLET | Freq: Three times a day (TID) | ORAL | Status: DC

## 2012-06-05 MED ORDER — ALBUTEROL SULFATE HFA 108 (90 BASE) MCG/ACT IN AERS
6.0000 | INHALATION_SPRAY | Freq: Four times a day (QID) | RESPIRATORY_TRACT | Status: DC
  Administered 2012-06-05 – 2012-06-08 (×11): 6 via RESPIRATORY_TRACT
  Filled 2012-06-05: qty 6.7

## 2012-06-05 MED ORDER — MIDODRINE HCL 5 MG PO TABS
10.0000 mg | ORAL_TABLET | Freq: Three times a day (TID) | ORAL | Status: DC
  Administered 2012-06-06 – 2012-06-12 (×20): 10 mg via ORAL
  Filled 2012-06-05 (×25): qty 2

## 2012-06-05 MED ORDER — SODIUM CHLORIDE 0.9 % IV BOLUS (SEPSIS): 500.0000 mL | Freq: Once | INTRAVENOUS | Status: DC

## 2012-06-05 MED ORDER — PANTOPRAZOLE SODIUM 40 MG PO TBEC: 40.0000 mg | DELAYED_RELEASE_TABLET | Freq: Every day | ORAL | Status: DC

## 2012-06-05 MED ORDER — PRISMASOL BGK 4/2.5 32-4-2.5 MEQ/L IV SOLN
INTRAVENOUS | Status: DC
  Administered 2012-06-05: 14:00:00 via INTRAVENOUS_CENTRAL
  Filled 2012-06-05 (×5): qty 5000

## 2012-06-05 MED ORDER — HEPARIN SODIUM (PORCINE) 1000 UNIT/ML DIALYSIS
1000.0000 [IU] | INTRAMUSCULAR | Status: DC | PRN
  Filled 2012-06-05: qty 6

## 2012-06-05 MED ORDER — SODIUM CHLORIDE 0.9 % IV BOLUS (SEPSIS): 500.0000 mL | INTRAVENOUS | Status: DC | PRN

## 2012-06-05 MED ORDER — CIPROFLOXACIN IN D5W 400 MG/200ML IV SOLN
400.0000 mg | Freq: Two times a day (BID) | INTRAVENOUS | Status: DC
  Administered 2012-06-05 – 2012-06-08 (×6): 400 mg via INTRAVENOUS
  Filled 2012-06-05 (×7): qty 200

## 2012-06-05 MED ORDER — CHLORHEXIDINE GLUCONATE 0.12 % MT SOLN
OROMUCOSAL | Status: AC
  Administered 2012-06-05: 15 mL
  Filled 2012-06-05: qty 15

## 2012-06-05 MED ORDER — FENTANYL CITRATE 0.05 MG/ML IJ SOLN
50.0000 ug | INTRAMUSCULAR | Status: DC | PRN
  Administered 2012-06-05: 100 ug via INTRAVENOUS
  Administered 2012-06-05 (×2): 50 ug via INTRAVENOUS
  Administered 2012-06-05 – 2012-06-06 (×6): 100 ug via INTRAVENOUS
  Administered 2012-06-07: 50 ug via INTRAVENOUS
  Administered 2012-06-07 – 2012-06-09 (×2): 100 ug via INTRAVENOUS
  Administered 2012-06-09: 50 ug via INTRAVENOUS
  Filled 2012-06-05 (×13): qty 2

## 2012-06-05 MED ORDER — ETOMIDATE 2 MG/ML IV SOLN
30.0000 mg | Freq: Once | INTRAVENOUS | Status: AC
  Administered 2012-06-05: 30 mg via INTRAVENOUS
  Filled 2012-06-05: qty 15

## 2012-06-05 MED ORDER — SEVELAMER CARBONATE 800 MG PO TABS
1600.0000 mg | ORAL_TABLET | Freq: Three times a day (TID) | ORAL | Status: DC
  Administered 2012-06-06 – 2012-06-12 (×11): 1600 mg via ORAL
  Filled 2012-06-05 (×25): qty 2

## 2012-06-05 MED ORDER — METRONIDAZOLE IVPB CUSTOM
250.0000 mg | Freq: Three times a day (TID) | INTRAVENOUS | Status: DC
  Administered 2012-06-05 – 2012-06-09 (×12): 250 mg via INTRAVENOUS
  Filled 2012-06-05 (×16): qty 50

## 2012-06-05 MED ORDER — SODIUM CHLORIDE 0.9 % IV BOLUS (SEPSIS)
1000.0000 mL | Freq: Once | INTRAVENOUS | Status: AC
  Administered 2012-06-05: 1000 mL via INTRAVENOUS

## 2012-06-05 MED ORDER — RENA-VITE PO TABS
1.0000 | ORAL_TABLET | Freq: Every day | ORAL | Status: DC
  Administered 2012-06-06 – 2012-06-11 (×6): 1 via ORAL
  Filled 2012-06-05 (×8): qty 1

## 2012-06-05 MED ORDER — MIDAZOLAM HCL 2 MG/2ML IJ SOLN
2.0000 mg | INTRAMUSCULAR | Status: DC | PRN
  Administered 2012-06-05 – 2012-06-06 (×6): 2 mg via INTRAVENOUS
  Administered 2012-06-06: 4 mg via INTRAVENOUS
  Filled 2012-06-05: qty 2
  Filled 2012-06-05: qty 4
  Filled 2012-06-05 (×5): qty 2

## 2012-06-05 MED ORDER — ONDANSETRON HCL 4 MG/2ML IJ SOLN
4.0000 mg | Freq: Three times a day (TID) | INTRAMUSCULAR | Status: DC | PRN
  Administered 2012-06-05 – 2012-06-07 (×2): 4 mg via INTRAVENOUS
  Filled 2012-06-05: qty 2

## 2012-06-05 MED ORDER — DOBUTAMINE IN D5W 4-5 MG/ML-% IV SOLN
2.5000 ug/kg/min | INTRAVENOUS | Status: DC | PRN
  Filled 2012-06-05: qty 250

## 2012-06-05 MED ORDER — PANTOPRAZOLE SODIUM 40 MG IV SOLR
40.0000 mg | INTRAVENOUS | Status: DC
  Administered 2012-06-05 – 2012-06-12 (×8): 40 mg via INTRAVENOUS
  Filled 2012-06-05 (×9): qty 40

## 2012-06-05 MED ORDER — SODIUM CHLORIDE 0.9 % FOR CRRT
INTRAVENOUS_CENTRAL | Status: DC | PRN
  Filled 2012-06-05: qty 1000

## 2012-06-05 MED ORDER — HEPARIN SODIUM (PORCINE) 5000 UNIT/ML IJ SOLN
5000.0000 [IU] | Freq: Three times a day (TID) | INTRAMUSCULAR | Status: DC
  Administered 2012-06-05 – 2012-06-11 (×19): 5000 [IU] via SUBCUTANEOUS
  Filled 2012-06-05 (×22): qty 1

## 2012-06-05 MED ORDER — METRONIDAZOLE IN NACL 5-0.79 MG/ML-% IV SOLN
500.0000 mg | Freq: Once | INTRAVENOUS | Status: AC
  Administered 2012-06-05: 500 mg via INTRAVENOUS
  Filled 2012-06-05: qty 100

## 2012-06-05 NOTE — Progress Notes (Deleted)
Xray placement of line confirmed with Anders Simmonds

## 2012-06-05 NOTE — Procedures (Signed)
Intubation Procedure Note Jayke Caul [id number] 01/02/1966  Procedure: Intubation Indications: Respiratory insufficiency  Procedure Details Consent: Unable to obtain consent because of emergent medical necessity. Time Out: Verified patient identification, verified procedure, site/side was marked, verified correct patient position, special equipment/implants available, medications/allergies/relevent history reviewed, required imaging and test results available.  Performed  Necessary equipment prepared Preoxygenated with 100% O2 #4 Mac blade Cords well visualized 8.0 ETT placed on first attempt Position confirmed with EZCap and auscultation Secured @ 24 cm @ incisors  Evaluation Hemodynamic Status: BP stable throughout; O2 sats: stable throughout Patient's Current Condition: stable Complications: No apparent complications Patient did tolerate procedure well. Chest X-ray ordered to verify placement.  CXR: tube position acceptable.   Billy Fischer 06/05/2012

## 2012-06-05 NOTE — Progress Notes (Signed)
CRITICAL VALUE ALERT  Critical value received:  Troponin 1.44  Date of notification:  06/05/2012  Time of notification:  0900  Critical value read back:yesyes  Nurse who received alert:  Acey Lav  MD notified (1st page):  alva  Time of first page:  0900  MD notified (2nd page):  Time of second page:  Responding MD:  alva  Time MD responded:  [phone]

## 2012-06-05 NOTE — Procedures (Signed)
Arterial Catheter Insertion Procedure Note [name] [id number] [birth year]  Procedure: Insertion of Arterial Catheter  Indications: Blood pressure monitoring  Procedure Details Consent: Risks of procedure as well as the alternatives and risks of each were explained to the (patient/caregiver).  Consent for procedure obtained. Time Out: Verified patient identification, verified procedure, site/side was marked, verified correct patient position, special equipment/implants available, medications/allergies/relevent history reviewed, required imaging and test results available.  Performed  Maximum sterile technique was used including antiseptics. Skin prep: Chlorhexidine; local anesthetic administered 20 gauge catheter was inserted into right radial artery using the Seldinger technique.  Evaluation Blood flow good; BP tracing good. Complications: No apparent complications.   [name] 06/05/2012

## 2012-06-05 NOTE — H&P (Addendum)
PULMONARY  / CRITICAL CARE MEDICINE  Name: [name] MRN: [id number] DOB: 1965/07/08    LOS: 1  REFERRING MD :  [facility]  CHIEF COMPLAINT:  Altered mental status & deafness  LINES / TUBES:   CULTURES: Blood 1/7 >>  ANTIBIOTICS: cipro 1/7 >> vanc 1/7 >> Flagyl 1/7 >>  SIGNIFICANT EVENTS:  1/7 low BP- tr to 2300  LEVEL OF CARE:  ICU PRIMARY SERVICE:  PCCM CONSULTANTS:  renal CODE STATUSfull DIET:  npo DVT Px:  Sq heaprin, IVC filter GI Px:  protonix  HISTORY OF PRESENT ILLNESS:  32 /M morbidly obese, ESRD - on HD T/T/S BIBEMS for chief c/o acute onset decreased hearing and tinnitus.As per his wife he has been afebrile, not complaining of pain, not coughing. Labs showed leucocytosis & elevated liver enzymes with nml bili & INR. Of note nml LFTs in 11/13 with nml hep profile. Low BP recorded intermittently ? Cuff issues , 1.5 L fluid given, empiric abx & admitted to ICU for potential CVVH. Of note, head CT neg & US abdomen - Status post cholecystectomy and right nephrectomy.  Large fluid collection in the right renal fossa correlates with liquefaction of hematoma seen on prior CT scan. PMH - adm 10/13 for retroperitoneal hematoma, has IVC filter with DVT     PAST MEDICAL HISTORY :  Past Medical History  Diagnosis Date  . Hypertension   . GERD (gastroesophageal reflux disease)   . DVT (deep venous thrombosis)   . Renal disorder   . Complication of anesthesia     " I am hard  to wake up "  . ESRD on hemodialysis     Dialysis at [facility] in [place], [place]. Started dialysis in 2010. ESRD due to DM/HTN.  . Diabetes mellitus    Past Surgical History  Procedure Date  . Gastric sleeve   . Kidney stones   . Dialysis port   . Compression hip screw 08/26/2011    Procedure: COMPRESSION HIP;  Surgeon: Velna Ochs, MD;  Location: [facility] OR;  Service: Orthopedics;  Laterality: Left;  DYNAMIC HIP SCREW COMPRESSION  . Ivc filter   . Cholecystostomy tube     Prior to Admission medications   Medication Sig Start Date End Date Taking? Authorizing Provider  ALPRAZolam Prudy Feeler) 1 MG tablet Take 1 mg by mouth every 6 (six) hours as needed. anxiety    Historical Provider, MD  carisoprodol (SOMA) 350 MG tablet Take 350 mg by mouth 3 (three) times daily.    Historical Provider, MD  epoetin alfa (EPOGEN,PROCRIT) 27253 UNIT/ML injection Inject 6,500 Units into the skin 3 (three) times a week. Receives at [facility]. Last visit 08/23/11    Historical Provider, MD  escitalopram (LEXAPRO) 20 MG tablet Take 20 mg by mouth daily.    Historical Provider, MD  midodrine (PROAMATINE) 10 MG tablet Take 1 tablet (10 mg total) by mouth 3 (three) times daily with meals. 04/06/12   Laveda Norman, MD  multivitamin (RENA-VIT) TABS tablet Take 1 tablet by mouth daily.    Historical Provider, MD  Nutritional Supplements (FEEDING SUPPLEMENT, NEPRO CARB STEADY,) LIQD Take 237 mLs by mouth as needed (missed meal during dialysis.). 04/06/12   Laveda Norman, MD  omeprazole (PRILOSEC) 20 MG capsule Take 20 mg by mouth 2 (two) times daily.    Historical Provider, MD  Oxycodone HCl 60 MG TB12 Take 1 tablet (60 mg total) by mouth every 8 (eight) hours. 04/06/12   Laveda Norman,  MD  sevelamer (RENVELA) 800 MG tablet Take 800-1,600 mg by mouth See admin instructions. Two capsules with each meal, and one capsule with each snack    Historical Provider, MD   Allergies  Allergen Reactions  . Piperacillin Sod-Tazobactam So Hives    FAMILY HISTORY:  No family history on file. SOCIAL HISTORY:  reports that he has been smoking Cigarettes.  He has never used smokeless tobacco. He reports that he does not drink alcohol or use illicit drugs.  REVIEW OF SYSTEMS:   Unable to obtained detialed ROS due to deafness Tried to reach wife & left message  INTERVAL HISTORY:   VITAL SIGNS: Temp:  [97.4 F (36.3 C)-99.5 F (37.5 C)] 97.4 F (36.3 C) (01/07 0615) Pulse Rate:  [79-100] 79  (01/07  0715) Resp:  [13-21] 16  (01/07 0715) BP: (78-111)/(39-72) 79/39 mmHg (01/07 0715) SpO2:  [90 %-97 %] 96 % (01/07 0715) Weight:  [159.9 kg (352 lb 8.3 oz)] 159.9 kg (352 lb 8.3 oz) (01/07 0500) HEMODYNAMICS:   VENTILATOR SETTINGS:   INTAKE / OUTPUT: Intake/Output    None     PHYSICAL EXAMINATION: Gen. obese, in no distress, normal affect ENT - no lesions, no post nasal drip Neck: No JVD, no thyromegaly, no carotid bruits Lungs: no use of accessory muscles, no dullness to percussion, clear without rales or rhonchi  Cardiovascular: Rhythm regular, heart sounds  normal, no murmurs, no peripheral edema, no rub Abdomen: soft and non-tender, no hepatosplenomegaly, BS normal., scars midline & of GB drains Musculoskeletal: No deformities, no cyanosis or clubbing, LUE AVF- good thrill Neuro:  alert, non focal Skin:  Warm, no lesions/ rash   LABS: Cbc  Lab 06/05/12 0532 06/05/12 0033 06/04/12 2226  WBC 17.7* -- --  HGB 9.5* 10.3* 10.6*  HCT 30.9* 33.5* 34.5*  PLT 203 237 223    Chemistry   Lab 06/05/12 0532 06/04/12 2226  NA 138 140  K 4.0 4.2  CL 100 100  CO2 24 25  BUN 48* 43*  CREATININE 6.21* 6.19*  CALCIUM 8.5 9.4  MG -- --  PHOS -- --  GLUCOSE 126* 143*    Liver fxn  Lab 06/05/12 0532 06/04/12 2226  AST 1499* 2606*  ALT 1435* 1654*  ALKPHOS 108 120*  BILITOT 0.3 0.3  PROT 6.8 7.7  ALBUMIN 2.7* 3.0*   coags  Lab 06/05/12 0033  APTT --  INR 1.29   Sepsis markers  Lab 06/05/12 0309  LATICACIDVEN 1.6  PROCALCITON --   Cardiac markers No results found for this basename: CKTOTAL:3,CKMB:3,TROPONINI:3 in the last 168 hours BNP No results found for this basename: PROBNP:3 in the last 168 hours ABG No results found for this basename: PHART:3,PCO2ART:3,PO2ART:3,HCO3:3,TCO2:3 in the last 168 hours  CBG trend No results found for this basename: GLUCAP:5 in the last 168 hours  IMAGING: pCXR - early CHF  ECG: nSR, ? RVH  DIAGNOSES: Principal  Problem:  *Transaminitis Active Problems:  ESRD (end stage renal disease) on dialysis  Hearing loss   ASSESSMENT / PLAN: Urosepsis, elevated liver enzymes ? Shock liver & acute onset deafness? Due to hypotension Hypercarbic resp failure  PULMONARY  ASSESSMENT:w/f edema/ fluid overload OSA Hypercarbic resp failure PLAN:  BiPAP Minimise fluids  CARDIOVASCULAR  ASSESSMENT: hypotension PLAN:  Ct midodrine Place A-line to assess cuff issue vs real, low lactate reassuring Trop x1  RENAL  ASSESSMENT:  ESRD PLAN:   HD if BP permits Otherwise, CVVH - will place trialysis catheter  GASTROINTESTINAL  ASSESSMENT:  Elevated liver enzymes ? Medication induced, hep profile neg 11/13, neg tylenol PLAN:   Npo Supportive care  HEMATOLOGIC  ASSESSMENT:  DVT 10/13 s/p IVC filter, h/o retroperitoneal bleed PLAN:  Sq heaprin  INFECTIOUS  ASSESSMENT:  Unclear source ? UTI PLAN:   Empiric cipro/ flagyl/vanc  ENDOCRINE  ASSESSMENT:  npo   PLAN:   CBG checks  NEUROLOGIC  ASSESSMENT:  Deafness, acute onset ? Hypotension related- no ototoxins given  No ear wax PLAN:   monitor    I have personally obtained a history, examined the patient, evaluated laboratory and imaging results, formulated the assessment and plan and placed orders. CRITICAL CARE: The patient is critically ill with multiple organ systems failure and requires high complexity decision making for assessment and support, frequent evaluation and titration of therapies, application of advanced monitoring technologies and extensive interpretation of multiple databases. Critical Care Time devoted to patient care services described in this note is 60 minutes.   Cyril Mourning MD. Tonny Bollman. [facility] Pager [phone] If no response call [phone]     06/05/2012, 7:43 AM

## 2012-06-05 NOTE — ED Provider Notes (Addendum)
Care as soon from prior team. Patient with altered mental status and decreased hearing as his chief complaint. Noted to have significantly elevated LFTs. No specific focal source found. Discussed with hospitalist for admission. He discussed case with nephrology who feels patient with low blood pressures will need CVVH. Hospitalist to discuss with the intensivist for admission to have this carried out. Patient has been afebrile. He has had some low blood pressures which has responded to IV fluids. Due to patient's body habitus, obtaining a blood pressure in him is difficult. Antibiotics have been given for potential for infectious source. He has history of allergy/hives to Zosyn, treating with vancomycin, Cipro and Flagyl.  Abd ultrasound shows that he has had his gallbladder and right kidney removed, I cannot find any records in our system where this was done.  Pt reports he has drainage tube placed in his gallbladder twice in [place].   CRITICAL CARE Performed by: Olivia Mackie   Total critical care time: 30 min  Critical care time was exclusive of separately billable procedures and treating other patients.  Critical care was necessary to treat or prevent imminent or life-threatening deterioration.  Critical care was time spent personally by me on the following activities: development of treatment plan with patient and/or surrogate as well as nursing, discussions with consultants, evaluation of patient's response to treatment, examination of patient, obtaining history from patient or surrogate, ordering and performing treatments and interventions, ordering and review of laboratory studies, ordering and review of radiographic studies, pulse oximetry and re-evaluation of patient's condition.   Results for orders placed during the hospital encounter of 06/04/12  CBC WITH DIFFERENTIAL      Component Value Range   WBC 23.1 (*) 4.0 - 10.5 K/uL   RBC 3.45 (*) 4.22 - 5.81 MIL/uL   Hemoglobin 10.6 (*)  13.0 - 17.0 g/dL   HCT 40.9 (*) 81.1 - 91.4 %   MCV 100.0  78.0 - 100.0 fL   MCH 30.7  26.0 - 34.0 pg   MCHC 30.7  30.0 - 36.0 g/dL   RDW 78.2 (*) 95.6 - 21.3 %   Platelets 223  150 - 400 K/uL   Neutrophils Relative 92 (*) 43 - 77 %   Neutro Abs 21.3 (*) 1.7 - 7.7 K/uL   Lymphocytes Relative 6 (*) 12 - 46 %   Lymphs Abs 1.4  0.7 - 4.0 K/uL   Monocytes Relative 2 (*) 3 - 12 %   Monocytes Absolute 0.5  0.1 - 1.0 K/uL   Eosinophils Relative 0  0 - 5 %   Eosinophils Absolute 0.0  0.0 - 0.7 K/uL   Basophils Relative 0  0 - 1 %   Basophils Absolute 0.0  0.0 - 0.1 K/uL  COMPREHENSIVE METABOLIC PANEL      Component Value Range   Sodium 140  135 - 145 mEq/L   Potassium 4.2  3.5 - 5.1 mEq/L   Chloride 100  96 - 112 mEq/L   CO2 25  19 - 32 mEq/L   Glucose, Bld 143 (*) 70 - 99 mg/dL   BUN 43 (*) 6 - 23 mg/dL   Creatinine, Ser 0.86 (*) 0.50 - 1.35 mg/dL   Calcium 9.4  8.4 - 57.8 mg/dL   Total Protein 7.7  6.0 - 8.3 g/dL   Albumin 3.0 (*) 3.5 - 5.2 g/dL   AST 4696 (*) 0 - 37 U/L   ALT 1654 (*) 0 - 53 U/L   Alkaline Phosphatase 120 (*)  39 - 117 U/L   Total Bilirubin 0.3  0.3 - 1.2 mg/dL   GFR calc non Af Amer 10 (*) >90 mL/min   GFR calc Af Amer 11 (*) >90 mL/min  URINALYSIS, ROUTINE W REFLEX MICROSCOPIC      Component Value Range   Color, Urine AMBER (*) YELLOW   APPearance CLOUDY (*) CLEAR   Specific Gravity, Urine 1.019  1.005 - 1.030   pH 5.5  5.0 - 8.0   Glucose, UA NEGATIVE  NEGATIVE mg/dL   Hgb urine dipstick MODERATE (*) NEGATIVE   Bilirubin Urine SMALL (*) NEGATIVE   Ketones, ur 15 (*) NEGATIVE mg/dL   Protein, ur >638 (*) NEGATIVE mg/dL   Urobilinogen, UA 0.2  0.0 - 1.0 mg/dL   Nitrite NEGATIVE  NEGATIVE   Leukocytes, UA MODERATE (*) NEGATIVE  CBC WITH DIFFERENTIAL      Component Value Range   WBC 22.1 (*) 4.0 - 10.5 K/uL   RBC 3.36 (*) 4.22 - 5.81 MIL/uL   Hemoglobin 10.3 (*) 13.0 - 17.0 g/dL   HCT 75.6 (*) 43.3 - 29.5 %   MCV 99.7  78.0 - 100.0 fL   MCH 30.7  26.0  - 34.0 pg   MCHC 30.7  30.0 - 36.0 g/dL   RDW 18.8 (*) 41.6 - 60.6 %   Platelets 237  150 - 400 K/uL   Neutrophils Relative 91 (*) 43 - 77 %   Neutro Abs 20.1 (*) 1.7 - 7.7 K/uL   Lymphocytes Relative 7 (*) 12 - 46 %   Lymphs Abs 1.5  0.7 - 4.0 K/uL   Monocytes Relative 2 (*) 3 - 12 %   Monocytes Absolute 0.5  0.1 - 1.0 K/uL   Eosinophils Relative 0  0 - 5 %   Eosinophils Absolute 0.0  0.0 - 0.7 K/uL   Basophils Relative 0  0 - 1 %   Basophils Absolute 0.0  0.0 - 0.1 K/uL  AMMONIA      Component Value Range   Ammonia 68 (*) 11 - 60 umol/L  URINE MICROSCOPIC-ADD ON      Component Value Range   Squamous Epithelial / LPF MANY (*) RARE   WBC, UA 11-20  <3 WBC/hpf   RBC / HPF 7-10  <3 RBC/hpf   Bacteria, UA RARE  RARE   Urine-Other LESS THAN 10 mL OF URINE SUBMITTED    SALICYLATE LEVEL      Component Value Range   Salicylate Lvl <2.0 (*) 2.8 - 20.0 mg/dL  ETHANOL      Component Value Range   Alcohol, Ethyl (B) <11  0 - 11 mg/dL  ACETAMINOPHEN LEVEL      Component Value Range   Acetaminophen (Tylenol), Serum <15.0  10 - 30 ug/mL  LIPASE, BLOOD      Component Value Range   Lipase 76 (*) 11 - 59 U/L  PROTIME-INR      Component Value Range   Prothrombin Time 15.8 (*) 11.6 - 15.2 seconds   INR 1.29  0.00 - 1.49  LACTIC ACID, PLASMA      Component Value Range   Lactic Acid, Venous 1.6  0.5 - 2.2 mmol/L   Dg Chest 2 View  06/04/2012  *RADIOLOGY REPORT*  Clinical Data: Rhonchi.  Altered level of consciousness.  CHEST - 2 VIEW  Comparison: 03/28/2012.  Findings: Cardiomegaly.  Bilateral right greater than left perihilar opacities, likely vascular.  Early congestive heart failure.  Bilateral perihilar infiltrates felt less likely.  Negative osseous structures.  Worsening aeration compared with priors.  IMPRESSION: Cardiomegaly with probable early CHF.   Original Report Authenticated By: Davonna Belling, M.D.    Ct Head Wo Contrast  06/04/2012  *RADIOLOGY REPORT*  Clinical Data: Severe  headache.  Altered mental status.  End-stage renal disease.  Morbid obesity.  CT HEAD WITHOUT CONTRAST  Technique:  Contiguous axial images were obtained from the base of the skull through the vertex without contrast.  Comparison: None.  Findings: The patient had difficulty remaining motionless for the study.  Images are suboptimal.  Small or subtle lesions could be overlooked. There is no evidence for acute infarction, intracranial hemorrhage, mass lesion, hydrocephalus, or extra-axial fluid.  Mild hyperostosis.  Slight atrophy.  No definite white matter disease. Clear sinuses and mastoids.  Negative orbits.  IMPRESSION: Motion degraded exam.  Slight atrophy is suspected.  No acute intracranial findings.   Original Report Authenticated By: Davonna Belling, M.D.    US Abdomen Complete  06/05/2012  *RADIOLOGY REPORT*  Clinical Data:  Elevated liver function tests.  COMPLETE ABDOMINAL ULTRASOUND  Comparison:  CT abdomen and pelvis 04/05/2012 and 03/28/2012.  Findings:  Gallbladder:  Removed.  Common bile duct:  Measures 0.7 cm.  Liver:  No focal lesion identified.  Within normal limits in parenchymal echogenicity.  IVC:  Appears normal.  Pancreas:  Not well seen but no focal abnormality is identified.  Spleen:  Measures 14.1 cm.  No focal lesion.  Right Kidney:  Removed.  There is a large fluid collection in the right renal fossa measuring 14.7 x 10.2 x 13.5 cm.  The collection is predominantly hypoechoic and correlates with liquefaction of hematoma seen on prior CT scan.  Left Kidney:  Measures 12.3 cm and demonstrates cortical thinning. No stone or hydronephrosis.  Abdominal aorta:  No aneurysm identified.  IMPRESSION:  1.  No focal abnormality or abnormality to explain elevated liver function tests. 2.  Status post cholecystectomy and right nephrectomy. 3.  Large fluid collection in the right renal fossa correlates with liquefaction of hematoma seen on prior CT scan.   Original Report Authenticated By: Holley Dexter, M.D.      Olivia Mackie, MD 06/05/12 [phone]    Date: 06/04/2012  Rate: 92  Rhythm: normal sinus rhythm  QRS Axis: normal  Intervals: normal  ST/T Wave abnormalities: normal  Conduction Disutrbances:none  Narrative Interpretation: low voltage throughout  Old EKG Reviewed: changes noted    Olivia Mackie, MD 06/05/12 [phone]

## 2012-06-05 NOTE — Procedures (Signed)
Central Venous hemodialysis Catheter Insertion Procedure Note [name] [id number] March 17, 1966  Procedure: Insertion of Central Venous Catheter Indications: hemodialysis   Procedure Details Consent: Unable to obtain consent because of emergent medical necessity. Time Out: Verified patient identification, verified procedure, site/side was marked, verified correct patient position, special equipment/implants available, medications/allergies/relevent history reviewed, required imaging and test results available.  Performed  Maximum sterile technique was used including antiseptics, cap, gloves, gown, hand hygiene, mask and sheet. Skin prep: Chlorhexidine; local anesthetic administered Real time [name] was used to ID and cannulate the right IJ  A antimicrobial bonded/coated triple lumen catheter was placed in the right internal jugular vein using the Seldinger technique.  Evaluation Blood flow good Complications: No apparent complications Patient did tolerate procedure well. Chest X-ray ordered to verify placement.  CXR: normal/pending .  BABCOCK,PETE 06/05/2012, 9:28 AM   Ultrasound used for site verification, live visualisation of needle entry & guidewire prior to dilation  ALVA,RAKESH V.

## 2012-06-05 NOTE — Progress Notes (Signed)
Respiratory therapy note- bipap placed post ABG on 4l/min [facility] per Dr. Vassie Loll. 10/5 at 50%. VT- 534 RR-16, comfortable at this time.

## 2012-06-05 NOTE — Progress Notes (Signed)
Dr Hyman Hopes notified of HD line placement

## 2012-06-05 NOTE — Consult Note (Signed)
Referring Provider: No ref. provider found Primary Care Physician:  Pcp Not In System Primary Nephrologist:  Dr. Emmie Niemann [facility]ph[facility]  Reason for Consultation:  Management of ESRD, Anemia and Secondary Hyperparathyroidism  HPI: ESRD - on HD T/T/S BIBEMS for chief c/o acute onset decreased hearing and tinnitus. Shock responsive to pressors. Found to have elevated liver enzymes.   Past Medical History  Diagnosis Date  . Hypertension   . GERD (gastroesophageal reflux disease)   . DVT (deep venous thrombosis)   . Renal disorder   . Complication of anesthesia     " I am hard  to wake up "  . ESRD on hemodialysis     Dialysis at [facility] in [place], [place]. Started dialysis in 2010. ESRD due to DM/HTN.  . Diabetes mellitus     Past Surgical History  Procedure Date  . Gastric sleeve   . Kidney stones   . Dialysis port   . Compression hip screw 08/26/2011    Procedure: COMPRESSION HIP;  Surgeon: Velna Ochs, MD;  Location: [facility] OR;  Service: Orthopedics;  Laterality: Left;  DYNAMIC HIP SCREW COMPRESSION  . Ivc filter   . Cholecystostomy tube     Prior to Admission medications   Medication Sig Start Date End Date Taking? Authorizing Provider  ALPRAZolam Prudy Feeler) 1 MG tablet Take 1 mg by mouth every 6 (six) hours as needed. anxiety    Historical Provider, MD  carisoprodol (SOMA) 350 MG tablet Take 350 mg by mouth 3 (three) times daily.    Historical Provider, MD  epoetin alfa (EPOGEN,PROCRIT) 87564 UNIT/ML injection Inject 6,500 Units into the skin 3 (three) times a week. Receives at [facility]. Last visit 08/23/11    Historical Provider, MD  escitalopram (LEXAPRO) 20 MG tablet Take 20 mg by mouth daily.    Historical Provider, MD  midodrine (PROAMATINE) 10 MG tablet Take 1 tablet (10 mg total) by mouth 3 (three) times daily with meals. 04/06/12   Laveda Norman, MD  multivitamin (RENA-VIT) TABS tablet Take 1 tablet by mouth daily.    Historical Provider, MD  Nutritional  Supplements (FEEDING SUPPLEMENT, NEPRO CARB STEADY,) LIQD Take 237 mLs by mouth as needed (missed meal during dialysis.). 04/06/12   Laveda Norman, MD  omeprazole (PRILOSEC) 20 MG capsule Take 20 mg by mouth 2 (two) times daily.    Historical Provider, MD  Oxycodone HCl 60 MG TB12 Take 1 tablet (60 mg total) by mouth every 8 (eight) hours. 04/06/12   Laveda Norman, MD  sevelamer (RENVELA) 800 MG tablet Take 800-1,600 mg by mouth See admin instructions. Two capsules with each meal, and one capsule with each snack    Historical Provider, MD    Current Facility-Administered Medications  Medication Dose Route Frequency Provider Last Rate Last Dose  . ciprofloxacin (CIPRO) IVPB 400 mg  400 mg Intravenous Q12H Oretha Milch, MD      . sodium chloride 0.9 % bolus 500 mL  500 mL Intravenous Once Oretha Milch, MD       And  . sodium chloride 0.9 % bolus 500 mL  500 mL Intravenous Q30 min PRN Oretha Milch, MD       And  . norepinephrine (LEVOPHED) 16 mg in dextrose 5 % 250 mL infusion  2-30 mcg/min Intravenous PRN Oretha Milch, MD 15 mL/hr at 06/05/12 1100 16 mcg/min at 06/05/12 1100   And  . DOBUTamine (DOBUTREX) infusion 4000 mcg/mL  2.5-10 mcg/kg/min Intravenous PRN  Oretha Milch, MD      . escitalopram [facility]) tablet 20 mg  20 mg Oral Daily Hillary Bow, DO      . feeding supplement (NEPRO CARB STEADY) liquid 237 mL  237 mL Oral PRN Hillary Bow, DO      . heparin injection 1,000-6,000 Units  1,000-6,000 Units CRRT PRN Garnetta Buddy, MD      . heparin injection 5,000 Units  5,000 Units Subcutaneous Q8H Hillary Bow, DO   5,000 Units at 06/05/12 1610  . metroNIDAZOLE (FLAGYL) IVPB 250 mg  250 mg Intravenous Q8H Hillary Bow, DO      . midodrine (PROAMATINE) tablet 10 mg  10 mg Oral TID WC Hillary Bow, DO      . multivitamin (RENA-VIT) tablet 1 tablet  1 tablet Oral QHS Hillary Bow, DO      . ondansetron [facility]) 4 MG/2ML injection           . ondansetron (ZOFRAN) injection 4  mg  4 mg Intravenous Q8H PRN Oretha Milch, MD   4 mg at 06/05/12 1142  . OxyCODONE (OXYCONTIN) 12 hr tablet 60 mg  60 mg Oral Q8H Sendil Mordecai Rasmussen, MD      . pantoprazole (PROTONIX) injection 40 mg  40 mg Intravenous Q24H Oretha Milch, MD   40 mg at 06/05/12 1026  . phenylephrine (NEO-SYNEPHRINE) 10,000 mcg in dextrose 5 % 250 mL infusion  30-200 mcg/min Intravenous Titrated Oretha Milch, MD 15 mL/hr at 06/05/12 1100 10 mcg/min at 06/05/12 1100  . prismasol BGK 4/2.5 5,000 mL dialysis replacement fluid   CRRT Continuous Garnetta Buddy, MD      . prismasol BGK 4/2.5 5,000 mL dialysis replacement fluid   CRRT Continuous Garnetta Buddy, MD      . prismasol BGK 4/2.5 5,000 mL dialysis solution   CRRT Continuous Garnetta Buddy, MD      . sevelamer (RENVELA) tablet 1,600 mg  1,600 mg Oral TID WC Olivia Mackie, MD      . sevelamer (RENVELA) tablet 800 mg  800 mg Oral PRN Olivia Mackie, MD      . sodium chloride 0.9 % injection 3 mL  3 mL Intravenous Q12H Hillary Bow, DO   3 mL at 06/05/12 0956  . sodium chloride 0.9 % primer fluid for CRRT   CRRT PRN Garnetta Buddy, MD      . vancomycin [facility]) 1,500 mg in sodium chloride 0.9 % 250 mL IVPB  1,500 mg Intravenous Q24H Oretha Milch, MD        Allergies as of 06/04/2012 - Review Complete 03/25/2012  Allergen Reaction Noted  . Piperacillin sod-tazobactam so Hives 08/24/2011    No family history on file.  History   Social History  . Marital Status: Married    Spouse Name: N/A    Number of Children: N/A  . Years of Education: N/A   Occupational History  . Not on file.   Social History Main Topics  . Smoking status: Current Every Day Smoker    Types: Cigarettes  . Smokeless tobacco: Never Used  . Alcohol Use: No  . Drug Use: No  . Sexually Active: Not Currently   Other Topics Concern  . Not on file   Social History Narrative  . No narrative on file    Review of Systems: Gen: Denies any fever, chills, sweats, anorexia, +  fatigue, + weakness, +  malaise, weight loss, and sleep disorder HEENT: No visual complaints, No history of Retinopathy. Normal external appearance No Epistaxis or Sore throat. No sinusitis.   CV: Denies chest pain, angina, palpitations, syncope, orthopnea, PND, peripheral edema, and claudication. Midodrine for blood pressure support as outpatient Resp: Denies dyspnea at rest, dyspnea with exercise, cough, sputum, wheezing, coughing up blood, and pleurisy. GI: Denies vomiting blood, jaundice, and fecal incontinence.   Denies dysphagia or odynophagia. GU :ESRD MS: Denies joint pain, limitation of movement, and swelling, stiffness, low back pain, extremity pain. Denies muscle weakness, cramps, atrophy.  No use of non steroidal antiinflammatory drugs. Derm: Denies rash, itching, dry skin, hives, moles, warts, or unhealing ulcers.  Psych: Denies depression, anxiety, memory loss, suicidal ideation, hallucinations, paranoia, and confusion. Heme: Denies bruising, bleeding, and enlarged lymph nodes. Neuro: No headache.  No diplopia. No dysarthria.  No dysphasia.  No history of CVA.  No Seizures. No paresthesias.   weakness. Endocrine + DM.  No Thyroid disease.  No Adrenal disease.  Physical Exam: Vital signs in last 24 hours: Temp:  [97.2 F (36.2 C)-99.5 F (37.5 C)] 97.2 F (36.2 C) (01/07 0831) Pulse Rate:  [72-100] 76  (01/07 1115) Resp:  [12-24] 18  (01/07 1115) BP: (52-111)/(28-72) 52/29 mmHg (01/07 0745) SpO2:  [90 %-100 %] 96 % (01/07 1115) Arterial Line BP: (81-133)/(38-62) 110/48 mmHg (01/07 1115) FiO2 (%):  [36 %] 36 % (01/07 0814) Weight:  [159.9 kg (352 lb 8.3 oz)] 159.9 kg (352 lb 8.3 oz) (01/07 0500)   General:   Chronically Ill Head:  Normocephalic and atraumatic. Eyes:  Sclera clear, no icterus.   Conjunctiva pink. Nose:  No deformity, discharge,  or lesions. Mouth:  No deformity or lesions, dentition normal. Neck:  Supple; no masses or thyromegaly. JVP not elevated Lungs:   Clear throughout to auscultation.   No wheezes, crackles, or rhonchi. No acute distress. Heart:  Regular rate and rhythm; no murmurs, clicks, rubs,  or gallops. Abdomen:  Soft, nontender and nondistended. No masses, hepatosplenomegaly or hernias noted. Normal bowel sounds, without guarding, and without rebound.   Msk:  Symmetrical without gross deformities. Normal posture. Pulses:  No carotid, renal, femoral bruits. DP and PT symmetrical and equal Extremities:  Without clubbing or edema. LUE AVF Neurologic:  Alert and  oriented x4;  grossly normal neurologically. Skin:  Intact without significant lesions or rashes. Cervical Nodes:  No significant cervical adenopathy. Psych:  Alert and cooperative. Normal mood and affect.  Intake/Output from previous day: 01/06 0701 - 01/07 0700 In: 100 [IV Piggyback:100] Out: -  Intake/Output this shift: Total I/O In: 654.4 [I.V.:144.4; IV Piggyback:510] Out: -   Lab Results:  Basename 06/05/12 0532 06/05/12 0033 06/04/12 2226  WBC 17.7* 22.1* 23.1*  HGB 9.5* 10.3* 10.6*  HCT 30.9* 33.5* 34.5*  PLT 203 237 223   BMET  Basename 06/05/12 0532 06/04/12 2226  NA 138 140  K 4.0 4.2  CL 100 100  CO2 24 25  GLUCOSE 126* 143*  BUN 48* 43*  CREATININE 6.21* 6.19*  CALCIUM 8.5 9.4  PHOS -- --   LFT  Basename 06/05/12 0532  PROT 6.8  ALBUMIN 2.7*  AST 1499*  ALT 1435*  ALKPHOS 108  BILITOT 0.3  BILIDIR --  IBILI --   PT/INR  Basename 06/05/12 0033  LABPROT 15.8*  INR 1.29   Hepatitis Panel No results found for this basename: HEPBSAG,HCVAB,HEPAIGM,HEPBIGM in the last 72 hours  Studies/Results: Dg Chest 2 View  06/04/2012  *  RADIOLOGY REPORT*  Clinical Data: Rhonchi.  Altered level of consciousness.  CHEST - 2 VIEW  Comparison: 03/28/2012.  Findings: Cardiomegaly.  Bilateral right greater than left perihilar opacities, likely vascular.  Early congestive heart failure.  Bilateral perihilar infiltrates felt less likely. Negative osseous  structures.  Worsening aeration compared with priors.  IMPRESSION: Cardiomegaly with probable early CHF.   Original Report Authenticated By: Davonna Belling, M.D.    Ct Head Wo Contrast  06/04/2012  *RADIOLOGY REPORT*  Clinical Data: Severe headache.  Altered mental status.  End-stage renal disease.  Morbid obesity.  CT HEAD WITHOUT CONTRAST  Technique:  Contiguous axial images were obtained from the base of the skull through the vertex without contrast.  Comparison: None.  Findings: The patient had difficulty remaining motionless for the study.  Images are suboptimal.  Small or subtle lesions could be overlooked. There is no evidence for acute infarction, intracranial hemorrhage, mass lesion, hydrocephalus, or extra-axial fluid.  Mild hyperostosis.  Slight atrophy.  No definite white matter disease. Clear sinuses and mastoids.  Negative orbits.  IMPRESSION: Motion degraded exam.  Slight atrophy is suspected.  No acute intracranial findings.   Original Report Authenticated By: Davonna Belling, M.D.    US Abdomen Complete  06/05/2012  *RADIOLOGY REPORT*  Clinical Data:  Elevated liver function tests.  COMPLETE ABDOMINAL ULTRASOUND  Comparison:  CT abdomen and pelvis 04/05/2012 and 03/28/2012.  Findings:  Gallbladder:  Removed.  Common bile duct:  Measures 0.7 cm.  Liver:  No focal lesion identified.  Within normal limits in parenchymal echogenicity.  IVC:  Appears normal.  Pancreas:  Not well seen but no focal abnormality is identified.  Spleen:  Measures 14.1 cm.  No focal lesion.  Right Kidney:  Removed.  There is a large fluid collection in the right renal fossa measuring 14.7 x 10.2 x 13.5 cm.  The collection is predominantly hypoechoic and correlates with liquefaction of hematoma seen on prior CT scan.  Left Kidney:  Measures 12.3 cm and demonstrates cortical thinning. No stone or hydronephrosis.  Abdominal aorta:  No aneurysm identified.  IMPRESSION:  1.  No focal abnormality or abnormality to explain elevated  liver function tests. 2.  Status post cholecystectomy and right nephrectomy. 3.  Large fluid collection in the right renal fossa correlates with liquefaction of hematoma seen on prior CT scan.   Original Report Authenticated By: Holley Dexter, M.D.    Dg Chest Port 1 View  06/05/2012  *RADIOLOGY REPORT*  Clinical Data: Central line placement  PORTABLE CHEST - 1 VIEW  Comparison: 06/04/2012  Findings: Stable cardiomegaly and prominent bilateral hila likely related to some degree of pulmonary artery enlargement bilaterally. This is similar to the prior study.  There is a new right central venous line via internal jugular approach.  Central line distally projects to the left of the spine.  The patient is somewhat rotated toward the left.  IMPRESSION: No pneumothorax.  Cannot verify appropriate venous positioning of central line.  Based on central line contour, arterial positioning is not excluded.  This may be due to patient rotation.   Original Report Authenticated By: Esperanza Heir, M.D.     Assessment/Plan:  ESRD TTS   Shock    blood pressure too low for conventional HD, will try CVVHD. Agree with work up and empiric antibiotics  Metabolic acidosis and electrolytes  Plan CVVHDF  Anemia will follow   LOS: 1 Ronni Osterberg W @TODAY @12 :25 PM

## 2012-06-05 NOTE — Progress Notes (Signed)
Tried to contact wife on phone number provided to get more information, got voice mail, left message.

## 2012-06-05 NOTE — Progress Notes (Addendum)
ANTIBIOTIC CONSULT NOTE - INITIAL  Pharmacy Consult for vancomycin  Indication: Possible ascending cholangitis  Allergies  Allergen Reactions  . Piperacillin Sod-Tazobactam So Hives    Patient Measurements: Height: 6' 0.05" (183 cm) Weight: 352 lb 8.3 oz (159.9 kg) (Per 04/06/12 documentation) IBW/kg (Calculated) : 77.71   Vital Signs: Temp: 99.5 F (37.5 C) (01/06 2337) Temp src: Rectal (01/06 2337) BP: 105/57 mmHg (01/07 0120) Pulse Rate: 88  (01/07 0120) Intake/Output from previous day:   Intake/Output from this shift:    Labs:  Basename 06/05/12 0033 06/04/12 2226  WBC 22.1* 23.1*  HGB 10.3* 10.6*  PLT 237 223  LABCREA -- --  CREATININE -- 6.19*   Estimated Creatinine Clearance: 23.8 ml/min (by C-G formula based on Cr of 6.19). No results found for this basename: VANCOTROUGH:2,VANCOPEAK:2,VANCORANDOM:2,GENTTROUGH:2,GENTPEAK:2,GENTRANDOM:2,TOBRATROUGH:2,TOBRAPEAK:2,TOBRARND:2,AMIKACINPEAK:2,AMIKACINTROU:2,AMIKACIN:2, in the last 72 hours   Microbiology: No results found for this or any previous visit (from the past 720 hour(s)).  Medical History: Past Medical History  Diagnosis Date  . Hypertension   . GERD (gastroesophageal reflux disease)   . DVT (deep venous thrombosis)   . Renal disorder   . Complication of anesthesia     " I am hard  to wake up "  . ESRD on hemodialysis     Dialysis at [facility] in [place], [place]. Started dialysis in 2010. ESRD due to DM/HTN.  . Diabetes mellitus     Medications:  Scheduled:    . escitalopram  20 mg Oral Daily  . heparin  5,000 Units Subcutaneous Q8H  . midodrine  10 mg Oral TID WC  . multivitamin  1 tablet Oral Daily  . Oxycodone HCl  60 mg Oral Q8H  . pantoprazole  40 mg Oral Daily  . sevelamer  800-1,600 mg Oral See admin instructions  . sodium chloride  3 mL Intravenous Q12H  . [COMPLETED] vancomycin  1,000 mg Intravenous Once   Assessment: 47 yo male who presented to ED with impaired hearing  and altered mental status. Patient found to have AST 2606 and ALT 1654. Pharmacy to manage vancomycin for possible ascending cholangitis. Patient has already received vancomycin 1gm IV x 1. Patient is with ESRD on HD prior to admission. Per EDP note, possible CVVH for patient due to low blood pressure.  Goal of Therapy:  Pre-HD vancomycin level 15-25 mcg/mL Post-HD vancomycin level 5-15 mcg/mL   Plan:  1. Vancomycin 1.5 gm IV x 1 (total of 2.5gm vancomycin loading dose) 2. Follow-up for HD schedule vs possible CVVH.   3. Monitor orders for renal / hepatic dose adjustments.   Reynolds, Mellody Drown 06/05/2012,5:37 AM  Adding Cipro to regimen for gram negative coverage. Plan to start CVVHD, will begin cipro 400mg  IV q12h and continue vanc with 1500mg  IV q24h while on CVVHD.   Verlene Mayer, PharmD, BCPS Pager [phone]

## 2012-06-06 ENCOUNTER — Inpatient Hospital Stay (HOSPITAL_COMMUNITY): Payer: Medicare Other

## 2012-06-06 DIAGNOSIS — I059 Rheumatic mitral valve disease, unspecified: Secondary | ICD-10-CM

## 2012-06-06 DIAGNOSIS — D649 Anemia, unspecified: Secondary | ICD-10-CM

## 2012-06-06 LAB — COMPREHENSIVE METABOLIC PANEL
AST: 416 U/L — ABNORMAL HIGH (ref 0–37)
Albumin: 2.8 g/dL — ABNORMAL LOW (ref 3.5–5.2)
Alkaline Phosphatase: 122 U/L — ABNORMAL HIGH (ref 39–117)
BUN: 36 mg/dL — ABNORMAL HIGH (ref 6–23)
Chloride: 98 mEq/L (ref 96–112)
Potassium: 3.9 mEq/L (ref 3.5–5.1)
Total Bilirubin: 0.4 mg/dL (ref 0.3–1.2)

## 2012-06-06 LAB — POCT I-STAT 3, ART BLOOD GAS (G3+)
Acid-base deficit: 3 mmol/L — ABNORMAL HIGH (ref 0.0–2.0)
Acid-base deficit: 1 mmol/L (ref 0.0–2.0)
Bicarbonate: 25.6 mEq/L — ABNORMAL HIGH (ref 20.0–24.0)
Bicarbonate: 25.2 mEq/L — ABNORMAL HIGH (ref 20.0–24.0)
O2 Saturation: 93 %
O2 Saturation: 93 %
Patient temperature: 98
Patient temperature: 98
TCO2: 28 mmol/L (ref 0–100)
TCO2: 27 mmol/L (ref 0–100)
pO2, Arterial: 81 mmHg (ref 80.0–100.0)
pO2, Arterial: 73 mmHg — ABNORMAL LOW (ref 80.0–100.0)

## 2012-06-06 LAB — HEPATITIS PANEL, ACUTE
HCV Ab: NEGATIVE
Hep A IgM: NEGATIVE
Hepatitis B Surface Ag: NEGATIVE

## 2012-06-06 LAB — PHOSPHORUS: Phosphorus: 3.7 mg/dL (ref 2.3–4.6)

## 2012-06-06 LAB — CBC
HCT: 31 % — ABNORMAL LOW (ref 39.0–52.0)
Platelets: 185 10*3/uL (ref 150–400)
RBC: 3.18 MIL/uL — ABNORMAL LOW (ref 4.22–5.81)
RDW: 16.6 % — ABNORMAL HIGH (ref 11.5–15.5)
WBC: 17.6 10*3/uL — ABNORMAL HIGH (ref 4.0–10.5)

## 2012-06-06 LAB — RENAL FUNCTION PANEL
BUN: 32 mg/dL — ABNORMAL HIGH (ref 6–23)
Chloride: 101 mEq/L (ref 96–112)
Creatinine, Ser: 4.06 mg/dL — ABNORMAL HIGH (ref 0.50–1.35)
Glucose, Bld: 124 mg/dL — ABNORMAL HIGH (ref 70–99)
Potassium: 3.7 mEq/L (ref 3.5–5.1)

## 2012-06-06 LAB — GLUCOSE, CAPILLARY: Glucose-Capillary: 122 mg/dL — ABNORMAL HIGH (ref 70–99)

## 2012-06-06 MED ORDER — NOREPINEPHRINE BITARTRATE 1 MG/ML IJ SOLN
2.0000 ug/min | INTRAVENOUS | Status: DC
  Filled 2012-06-06: qty 4

## 2012-06-06 MED ORDER — NEPRO/CARBSTEADY PO LIQD
1000.0000 mL | ORAL | Status: DC
  Administered 2012-06-06: 30 mL
  Filled 2012-06-06 (×7): qty 1000

## 2012-06-06 MED ORDER — CHLORHEXIDINE GLUCONATE 0.12 % MT SOLN
OROMUCOSAL | Status: AC
  Administered 2012-06-06: 15 mL
  Filled 2012-06-06: qty 15

## 2012-06-06 MED ORDER — MIDAZOLAM HCL 2 MG/2ML IJ SOLN
2.0000 mg | INTRAMUSCULAR | Status: DC | PRN
  Administered 2012-06-06 (×2): 2 mg via INTRAVENOUS
  Filled 2012-06-06 (×2): qty 2

## 2012-06-06 MED ORDER — PRO-STAT SUGAR FREE PO LIQD
30.0000 mL | Freq: Every day | ORAL | Status: DC
  Administered 2012-06-06 – 2012-06-10 (×7): 30 mL
  Filled 2012-06-06 (×36): qty 30

## 2012-06-06 NOTE — Progress Notes (Addendum)
INITIAL NUTRITION ASSESSMENT  DOCUMENTATION CODES Per approved criteria  -Morbid Obesity   INTERVENTION:  Initiate Nepro formula at 10 ml/hr and increase by 10 ml every 4 hours to goal rate of 30 ml/hr with Prostat liquid protein 6 times daily to provide 1896 kcals (70% of estimated kcal needs), 148 gm protein (92% of estimated protein needs), 523 ml of free water RD to follow for nutrition care plan  NUTRITION DIAGNOSIS: Inadequate oral intake related to inability to eat as evidenced by NPO status  Goal: Initiate EN support within next 24 hours if prolonged intubation expected  Monitor:  EN initiation & tolerance, respiratory status, weight, labs, I/O's  Reason for Assessment: Consult, Malnutrition Screening Tool Report, Low Braden  47 y.o. male  Admitting Dx: Transaminitis  ASSESSMENT: Patient is currently intubated on ventilator support MV: 13.1 Temp: 36.8  Patient with hx of ESRD on HD admitted with decreased hearing and tinnitus; labs showed leucocytosis and elevated liver enzymes; admitted to ICU for CVVHD; OGT in place; + norepinephrine drip; patient at risk for skin breakdown given low braden score.  Height: Ht Readings from Last 1 Encounters:  06/05/12 6' 0.05" (1.83 m)    Weight: Wt Readings from Last 1 Encounters:  06/06/12 339 lb 1.1 oz (153.8 kg)    Ideal Body Weight: 178 lb  % Ideal Body Weight: 190%  Wt Readings from Last 10 Encounters:  06/06/12 339 lb 1.1 oz (153.8 kg)  04/06/12 352 lb 8.3 oz (159.9 kg)  09/10/11 358 lb 7.5 oz (162.6 kg)  09/01/11 334 lb 10.5 oz (151.8 kg)  09/01/11 334 lb 10.5 oz (151.8 kg)    Usual Body Weight: unable to obtain  % Usual Body Weight: ---  BMI:  Body mass index is 45.93 kg/(m^2).  Estimated Nutritional Needs: Kcal: 2700 Protein: 160-170 gm Fluid: per MD  Skin: Intact  Diet Order: NPO  EDUCATION NEEDS: -No education needs identified at this time   Intake/Output Summary (Last 24 hours) at  06/06/12 1004 Last data filed at 06/06/12 0900  Gross per 24 hour  Intake 1568.2 ml  Output   1802 ml  Net -233.8 ml    Labs:   Lab 06/06/12 0415 06/05/12 1648 06/05/12 0532  NA 137 139 138  K 3.9 3.9 4.0  CL 98 99 100  CO2 21 20 24   BUN 36* 47* 48*  CREATININE 4.68* 6.03* 6.21*  CALCIUM 9.1 8.5 8.5  MG -- -- --  PHOS 3.7 5.5* --  GLUCOSE 124* 150* 126*    CBG (last 3)   Basename 06/06/12 0741 06/05/12 2257 06/05/12 1602  GLUCAP 122* 112* 136*    Scheduled Meds:   . albuterol  6 puff Inhalation Q6H  . ciprofloxacin  400 mg Intravenous Q12H  . escitalopram  20 mg Oral Daily  . heparin  5,000 Units Subcutaneous Q8H  . metronidazole  250 mg Intravenous Q8H  . midodrine  10 mg Oral TID WC  . multivitamin  1 tablet Oral QHS  . OxyCODONE  60 mg Oral Q8H  . pantoprazole (PROTONIX) IV  40 mg Intravenous Q24H  . sevelamer  1,600 mg Oral TID WC  . sodium chloride  3 mL Intravenous Q12H  . vancomycin  1,500 mg Intravenous Q24H    Continuous Infusions:   . sodium chloride 20 mL/hr at 06/06/12 0400  . phenylephrine (NEO-SYNEPHRINE) Adult infusion 5 mcg/min (06/05/12 1200)  . dialysis replacement fluid (prismasate) 300 mL/hr at 06/05/12 1359  . dialysis replacement fluid (  prismasate) 200 mL/hr at 06/05/12 1359  . dialysate (PRISMASATE) 2,000 mL/hr at 06/06/12 0900    Past Medical History  Diagnosis Date  . Hypertension   . GERD (gastroesophageal reflux disease)   . DVT (deep venous thrombosis)   . Renal disorder   . Complication of anesthesia     " I am hard  to wake up "  . ESRD on hemodialysis     Dialysis at [facility] in [place], [place]. Started dialysis in 2010. ESRD due to DM/HTN.  . Diabetes mellitus     Past Surgical History  Procedure Date  . Gastric sleeve   . Kidney stones   . Dialysis port   . Compression hip screw 08/26/2011    Procedure: COMPRESSION HIP;  Surgeon: Velna Ochs, MD;  Location: [facility] OR;  Service: Orthopedics;   Laterality: Left;  DYNAMIC HIP SCREW COMPRESSION  . Ivc filter   . Cholecystostomy tube     Kirkland Hun, RD, LDN Pager #: [phone] After-Hours Pager #: [phone]

## 2012-06-06 NOTE — Progress Notes (Signed)
Echocardiogram 2D Echocardiogram has been performed.  [name] 06/06/2012, 10:46 AM

## 2012-06-06 NOTE — Progress Notes (Signed)
increased ps to10 til MD rounds

## 2012-06-06 NOTE — H&P (Signed)
PULMONARY  / CRITICAL CARE MEDICINE  Name: [name] MRN: [id number] DOB: May 14, 1966    LOS: 2 days  REFERRING MD :  [facility]  CHIEF COMPLAINT:  Altered mental status & deafness   HISTORY OF PRESENT ILLNESS:  16 /M morbidly obese, ESRD - on HD T/T/S BIBEMS for chief c/o acute onset decreased hearing and tinnitus.As per his wife he has been afebrile, not complaining of pain, not coughing. Labs showed leucocytosis & elevated liver enzymes with nml bili & INR. Of note nml LFTs in 11/13 with nml hep profile. Low BP recorded intermittently ? Cuff issues , 1.5 L fluid given, empiric abx & admitted to ICU for potential CVVH. Of note, head CT neg & US abdomen - Status post cholecystectomy and right nephrectomy.  Large fluid collection in the right renal fossa correlates with liquefaction of hematoma seen on prior CT scan. PMH - adm 10/13 for retroperitoneal hematoma, has IVC filter with DVT  LEVEL OF CARE:  ICU PRIMARY SERVICE:  PCCM CONSULTANTS:  renal CODE STATUSfull DIET:  npo DVT Px:  Sq heaprin, IVC filter GI Px:  protonix     LINES / TUBES: ETT 06/06/11 Rt IJ HD trialysis cath 06/06/11>>  CULTURES: Blood 1/7 >>  Results for orders placed during the hospital encounter of 06/04/12  CULTURE, BLOOD (ROUTINE X 2)     Status: Normal (Preliminary result)   Collection Time   06/04/12 11:58 PM      Component Value Range Status Comment   Specimen Description BLOOD RIGHT ARM   Final    Special Requests BOTTLES DRAWN AEROBIC AND ANAEROBIC 10CC EACH   Final    Culture  Setup Time 06/05/2012 04:09   Final    Culture     Final    Value:        BLOOD CULTURE RECEIVED NO GROWTH TO DATE CULTURE WILL BE HELD FOR 5 DAYS BEFORE ISSUING A FINAL NEGATIVE REPORT   Report Status PENDING   Incomplete   CULTURE, BLOOD (ROUTINE X 2)     Status: Normal (Preliminary result)   Collection Time   06/05/12 12:05 AM      Component Value Range Status Comment   Specimen Description BLOOD RIGHT HAND   Final    Special Requests BOTTLES DRAWN AEROBIC AND ANAEROBIC 10CC EACH   Final    Culture  Setup Time 06/05/2012 04:09   Final    Culture     Final    Value:        BLOOD CULTURE RECEIVED NO GROWTH TO DATE CULTURE WILL BE HELD FOR 5 DAYS BEFORE ISSUING A FINAL NEGATIVE REPORT   Report Status PENDING   Incomplete   MRSA PCR SCREENING     Status: Normal   Collection Time   06/05/12  6:53 AM      Component Value Range Status Comment   MRSA by PCR NEGATIVE  NEGATIVE Final   CULTURE, RESPIRATORY     Status: Normal (Preliminary result)   Collection Time   06/05/12  4:12 PM      Component Value Range Status Comment   Specimen Description TRACHEAL ASPIRATE   Final    Special Requests NONE   Final    Gram Stain     Final    Value: ABUNDANT WBC PRESENT, PREDOMINANTLY PMN     RARE SQUAMOUS EPITHELIAL CELLS PRESENT     FEW GRAM POSITIVE COCCI     IN PAIRS FEW GRAM POSITIVE RODS  FEW YEAST   Culture Culture reincubated for better growth   Final    Report Status PENDING   Incomplete      ANTIBIOTICS: cipro 1/7 >> vanc 1/7 >> Flagyl 1/7 >>  Anti-infectives     Start     Dose/Rate Route Frequency Ordered Stop   06/06/12 0800   vancomycin (VANCOCIN) 1,500 mg in sodium chloride 0.9 % 250 mL IVPB        1,500 mg 125 mL/hr over 120 Minutes Intravenous Every 24 hours 06/05/12 0901     06/06/12 0600   ciprofloxacin (CIPRO) IVPB 400 mg  Status:  Discontinued        400 mg 200 mL/hr over 60 Minutes Intravenous Every 24 hours 06/05/12 0638 06/05/12 0900   06/06/12 0000   ciprofloxacin (CIPRO) IVPB 400 mg  Status:  Discontinued        400 mg 200 mL/hr over 60 Minutes Intravenous Every 24 hours 06/05/12 0532 06/05/12 0638   06/05/12 1700   ciprofloxacin (CIPRO) IVPB 400 mg        400 mg 200 mL/hr over 60 Minutes Intravenous Every 12 hours 06/05/12 0901     06/05/12 1400   metroNIDAZOLE (FLAGYL) IVPB 250 mg        250 mg 50 mL/hr over 60 Minutes Intravenous Every 8 hours 06/05/12 0532     06/05/12  0730   vancomycin (VANCOCIN) 1,500 mg in sodium chloride 0.9 % 500 mL IVPB        1,500 mg 250 mL/hr over 120 Minutes Intravenous  Once 06/05/12 0639 06/05/12 1041   06/05/12 0315   vancomycin (VANCOCIN) IVPB 1000 mg/200 mL premix        1,000 mg 200 mL/hr over 60 Minutes Intravenous  Once 06/05/12 0311 06/05/12 0501   06/05/12 0315   ciprofloxacin (CIPRO) IVPB 400 mg        400 mg 200 mL/hr over 60 Minutes Intravenous  Once 06/05/12 0311 06/05/12 0607   06/05/12 0315   metroNIDAZOLE (FLAGYL) IVPB 500 mg        500 mg 100 mL/hr over 60 Minutes Intravenous  Once 06/05/12 0311 06/05/12 0711           SIGNIFICANT EVENTS:  1/7 low BP- tr to 2300. Intubated due to resp acidosis. On CVVH. On pressors  INTERVAL HISTORY:  06/07/11: Off pressors. Hearing is regained equally and bialterally. Normal WUA. Doing well on SBt and looked good for extubation but showed resp acidosis   VITAL SIGNS: Temp:  [97.3 F (36.3 C)-98.5 F (36.9 C)] 97.5 F (36.4 C) (01/08 0700) Pulse Rate:  [63-102] 71  (01/08 1000) Resp:  [11-33] 16  (01/08 1000) BP: (129)/(54) 129/54 mmHg (01/08 0933) SpO2:  [90 %-100 %] 97 % (01/08 1000) Arterial Line BP: (91-153)/(38-76) 100/38 mmHg (01/08 1000) FiO2 (%):  [39.5 %-100 %] 39.9 % (01/08 1000) Weight:  [153.6 kg (338 lb 10 oz)-153.8 kg (339 lb 1.1 oz)] 153.8 kg (339 lb 1.1 oz) (01/08 0500) HEMODYNAMICS: CVP:  [11 mmHg-12 mmHg] 12 mmHg VENTILATOR SETTINGS: Vent Mode:  [-] PRVC FiO2 (%):  [39.5 %-100 %] 39.9 % Set Rate:  [16 bmp] 16 bmp Vt Set:  [620 mL] 620 mL PEEP:  [4.5 cmH20-5 cmH20] 5 cmH20 Pressure Support:  [5 cmH20-10 cmH20] 10 cmH20 Plateau Pressure:  [22 cmH20-28 cmH20] 28 cmH20 INTAKE / OUTPUT: Intake/Output      01/07 0701 - 01/08 0700 01/08 0701 - 01/09 0700   I.V. (mL/kg) 727.1 (4.7)  75.5 (0.5)   NG/GT 90 30   IV Piggyback 1060 250   Total Intake(mL/kg) 1877.1 (12.2) 355.5 (2.3)   Emesis/NG output 450    Other 1066 459   Total Output 1516  459   Net +361.1 -103.5          PHYSICAL EXAMINATION: Gen. obese, in no distress, normal affect ENT - no lesions, no post nasal drip. NORMAL HEARING. TRACH SCAR + Neck: No JVD, no thyromegaly, no carotid bruits Lungs: no use of accessory muscles, no dullness to percussion, clear without rales or rhonchi  Cardiovascular: Rhythm regular, heart sounds  normal, no murmurs, no peripheral edema, no rub Abdomen: soft and non-tender, no hepatosplenomegaly, BS normal., scars midline & of GB drains Musculoskeletal: No deformities, no cyanosis or clubbing, LUE AVF- good thrill Neuro:  alert, non focal. NORMAL WUA Skin:  Warm, no lesions/ rash   LABS: Cbc  Lab 06/06/12 0415 06/05/12 0532 06/05/12 0033  WBC 17.6* -- --  HGB 9.7* 9.5* 10.3*  HCT 31.0* 30.9* 33.5*  PLT 185 203 237    Chemistry   Lab 06/06/12 0415 06/05/12 1648 06/05/12 0532  NA 137 139 138  K 3.9 3.9 4.0  CL 98 99 100  CO2 21 20 24   BUN 36* 47* 48*  CREATININE 4.68* 6.03* 6.21*  CALCIUM 9.1 8.5 8.5  MG -- -- --  PHOS 3.7 5.5* --  GLUCOSE 124* 150* 126*    Liver fxn  Lab 06/06/12 0415 06/05/12 1648 06/05/12 0532 06/04/12 2226  AST 416* -- 1499* 2606*  ALT 1154* -- 1435* 1654*  ALKPHOS 122* -- 108 120*  BILITOT 0.4 -- 0.3 0.3  PROT 7.3 -- 6.8 7.7  ALBUMIN 2.8* 2.7* 2.7* --   coags  Lab 06/05/12 0033  APTT --  INR 1.29   Sepsis markers  Lab 06/05/12 0309  LATICACIDVEN 1.6  PROCALCITON --   Cardiac markers  Lab 06/05/12 2124 06/05/12 1648 06/05/12 0830  CKTOTAL -- -- --  CKMB -- -- --  TROPONINI 0.91* 1.21* 1.44*   BNP No results found for this basename: PROBNP:3 in the last 168 hours ABG  Lab 06/06/12 1034 06/06/12 0927 06/05/12 1650  PHART 7.320* 7.202* 7.222*  PCO2ART 48.7* 64.9* 55.2*  PO2ART 73.0* 81.0 164.0*  HCO3 25.2* 25.6* 22.8  TCO2 27 28 24     CBG trend  Lab 06/06/12 0741 06/05/12 2257 06/05/12 1602 06/05/12 0829  GLUCAP 122* 112* 136* 100*    IMAGING: pCXR - early  CHF  ECG: nSR, ? RVH  DIAGNOSES: Principal Problem:  *Transaminitis Active Problems:  ESRD (end stage renal disease) on dialysis  Hearing loss  Septic shock(785.52)  Acute respiratory failure   ASSESSMENT / PLAN: Urosepsis, elevated liver enzymes ? Shock liver & acute onset deafness? Due to hypotension Hypercarbic resp failure  PULMONARY  ASSESSMENT:w/f edema/ fluid overload OSA Hypercarbic resp failure  On 06/06/2012: Looked good for extubation but had Acute Resp Acidosis on SBT and normal ABG when rested  PLAN:   FUll vent suppert; no extubation 06/07/11  CARDIOVASCULAR No results found for this basename: PROBNP:5 in the last 168 hours   Lab 06/05/12 2124 06/05/12 1648 06/05/12 0830  TROPONINI 0.91* 1.21* 1.44*     ASSESSMENT: hypotension needing midodrine and pressors On 06/06/2012: Off pressors since 06/07/11 morning  PLAN:  Midodrine per renal Get ECHO for mild trop leak  RENAL  ASSESSMENT:  ESRD On 06/06/2012: On CVVH since 06/06/11  PLAN:   Per renal  GASTROINTESTINAL  Lab 06/06/12 0415 06/05/12 1648 06/05/12 0532 06/05/12 0033 06/04/12 2226  AST 416* -- 1499* -- 2606*  ALT 1154* -- 1435* -- 1654*  ALKPHOS 122* -- 108 -- 120*  BILITOT 0.4 -- 0.3 -- 0.3  PROT 7.3 -- 6.8 -- 7.7  ALBUMIN 2.8* 2.7* 2.7* -- 3.0*  INR -- -- -- 1.29 --      ASSESSMENT:  Elevated liver enzymes ? Medication induced, hep profile neg 11/13, neg tylenol PLAN:   Start tube feeds Supportive care  HEMATOLOGIC\  Lab 06/06/12 0415 06/05/12 0532 06/05/12 0033  HGB 9.7* 9.5* 10.3*  HCT 31.0* 30.9* 33.5*  WBC 17.6* 17.7* 22.1*  PLT 185 203 237    Lab 06/05/12 0033  INR 1.29     ASSESSMENT:  DVT 10/13 s/p IVC filter, h/o retroperitoneal bleed PLAN:  Sq heaprin  INFECTIOUS No results found for this basename: PROCALCITON:5 in the last 168 hours   ASSESSMENT:  Unclear source ? UTI PLAN:   Empiric cipro/ flagyl/vanc PCT check  ENDOCRINE  ASSESSMENT:  npo   PLAN:    CBG checks  NEUROLOGIC  ASSESSMENT:  Deafness, acute onset ? Hypotension related- no ototoxins given. No ear wax  On 06/06/2012: hearning normal. Deafness likely low bp related  PLAN:   monitor   CRITICAL CARE: The patient is critically ill with multiple organ systems failure and requires high complexity decision making for assessment and support, frequent evaluation and titration of therapies, application of advanced monitoring technologies and extensive interpretation of multiple databases. Critical Care Time devoted to patient care services described in this note is .     Dr. Kalman Shan, M.D., [facility].C.P Pulmonary and Critical Care Medicine Staff Physician [facility] Pager: [phone], If no answer or between  15:00h - 7:00h: call [phone]  06/06/2012 10:55 AM

## 2012-06-07 ENCOUNTER — Inpatient Hospital Stay (HOSPITAL_COMMUNITY): Payer: Medicare Other

## 2012-06-07 LAB — POCT I-STAT 3, ART BLOOD GAS (G3+)
Acid-base deficit: 2 mmol/L (ref 0.0–2.0)
Bicarbonate: 26.6 mEq/L — ABNORMAL HIGH (ref 20.0–24.0)
O2 Saturation: 94 %
Patient temperature: 98.3
TCO2: 28 mmol/L (ref 0–100)
pCO2 arterial: 51.7 mmHg — ABNORMAL HIGH (ref 35.0–45.0)
pH, Arterial: 7.319 — ABNORMAL LOW (ref 7.350–7.450)
pH, Arterial: 7.294 — ABNORMAL LOW (ref 7.350–7.450)
pO2, Arterial: 78 mmHg — ABNORMAL LOW (ref 80.0–100.0)

## 2012-06-07 LAB — GLUCOSE, CAPILLARY
Glucose-Capillary: 111 mg/dL — ABNORMAL HIGH (ref 70–99)
Glucose-Capillary: 116 mg/dL — ABNORMAL HIGH (ref 70–99)

## 2012-06-07 LAB — PROCALCITONIN: Procalcitonin: 3.62 ng/mL

## 2012-06-07 NOTE — Progress Notes (Signed)
[facility] ROUNDING NOTE   Subjective:   Interval History: appears clinically improved  Objective:  Vital signs in last 24 hours:  Temp:  [97.4 F (36.3 C)-98.6 F (37 C)] 97.4 F (36.3 C) (01/09 1921) Pulse Rate:  [57-76] 57  (01/09 2100) Resp:  [12-23] 12  (01/09 2100) BP: (82-149)/(39-86) 120/53 mmHg (01/09 2100) SpO2:  [93 %-99 %] 96 % (01/09 2100) Arterial Line BP: (51-110)/(34-66) 65/56 mmHg (01/09 1200) FiO2 (%):  [39.8 %-40.8 %] 40 % (01/09 0800) Weight:  [154 kg (339 lb 8.1 oz)] 154 kg (339 lb 8.1 oz) (01/09 0500)  Weight change: 0.4 kg (14.1 oz) Filed Weights   06/05/12 1335 06/06/12 0500 06/07/12 0500  Weight: 153.6 kg (338 lb 10 oz) 153.8 kg (339 lb 1.1 oz) 154 kg (339 lb 8.1 oz)    Intake/Output: I/O last 3 completed shifts: In: 3003.6 [P.O.:100; I.V.:743.6; NG/GT:860; IV Piggyback:1300] Out: 534 [Other:534]   Intake/Output this shift:  Total I/O In: 40 [I.V.:40] Out: -   CVS- RRR RS- CTA intubated ABD- BS present soft non-distended EXT- no edema   Basic Metabolic Panel:  Lab 06/06/12 8657 06/06/12 0415 06/05/12 1648 06/05/12 0532 06/04/12 2226  NA 136 137 139 138 140  K 3.7 3.9 3.9 4.0 4.2  CL 101 98 99 100 100  CO2 21 21 20 24 25   GLUCOSE 124* 124* 150* 126* 143*  BUN 32* 36* 47* 48* 43*  CREATININE 4.06* 4.68* 6.03* 6.21* 6.19*  CALCIUM 8.8 9.1 8.5 -- --  MG -- -- -- -- --  PHOS 2.9 3.7 5.5* -- --    Liver Function Tests:  Lab 06/06/12 1626 06/06/12 0415 06/05/12 1648 06/05/12 0532 06/04/12 2226  AST -- 416* -- 1499* 2606*  ALT -- 1154* -- 1435* 1654*  ALKPHOS -- 122* -- 108 120*  BILITOT -- 0.4 -- 0.3 0.3  PROT -- 7.3 -- 6.8 7.7  ALBUMIN 2.4* 2.8* 2.7* 2.7* 3.0*    Lab 06/05/12 0033  LIPASE 76*  AMYLASE --    Lab 06/05/12 0033  AMMONIA 68*    CBC:  Lab 06/06/12 0415 06/05/12 0532 06/05/12 0033 06/04/12 2226  WBC 17.6* 17.7* 22.1* 23.1*  NEUTROABS -- -- 20.1* 21.3*  HGB 9.7* 9.5* 10.3* 10.6*  HCT 31.0*  30.9* 33.5* 34.5*  MCV 97.5 100.0 99.7 100.0  PLT 185 203 237 223    Cardiac Enzymes:  Lab 06/05/12 2124 06/05/12 1648 06/05/12 0830  CKTOTAL -- -- --  CKMB -- -- --  CKMBINDEX -- -- --  TROPONINI 0.91* 1.21* 1.44*    BNP: No components found with this basename: POCBNP:5  CBG:  Lab 06/07/12 0740 06/06/12 2312 06/06/12 1533 06/06/12 0741 06/05/12 2257  GLUCAP 116* 111* 107* 122* 112*    Microbiology: Results for orders placed during the hospital encounter of 06/04/12  CULTURE, BLOOD (ROUTINE X 2)     Status: Normal (Preliminary result)   Collection Time   06/04/12 11:58 PM      Component Value Range Status Comment   Specimen Description BLOOD RIGHT ARM   Final    Special Requests BOTTLES DRAWN AEROBIC AND ANAEROBIC 10CC EACH   Final    Culture  Setup Time 06/05/2012 04:09   Final    Culture     Final    Value:        BLOOD CULTURE RECEIVED NO GROWTH TO DATE CULTURE WILL BE HELD FOR 5 DAYS BEFORE ISSUING A FINAL NEGATIVE REPORT   Report Status  PENDING   Incomplete   CULTURE, BLOOD (ROUTINE X 2)     Status: Normal (Preliminary result)   Collection Time   06/05/12 12:05 AM      Component Value Range Status Comment   Specimen Description BLOOD RIGHT HAND   Final    Special Requests BOTTLES DRAWN AEROBIC AND ANAEROBIC 10CC EACH   Final    Culture  Setup Time 06/05/2012 04:09   Final    Culture     Final    Value:        BLOOD CULTURE RECEIVED NO GROWTH TO DATE CULTURE WILL BE HELD FOR 5 DAYS BEFORE ISSUING A FINAL NEGATIVE REPORT   Report Status PENDING   Incomplete   MRSA PCR SCREENING     Status: Normal   Collection Time   06/05/12  6:53 AM      Component Value Range Status Comment   MRSA by PCR NEGATIVE  NEGATIVE Final   CULTURE, RESPIRATORY     Status: Normal (Preliminary result)   Collection Time   06/05/12  4:12 PM      Component Value Range Status Comment   Specimen Description TRACHEAL ASPIRATE   Final    Special Requests NONE   Final    Gram Stain     Final     Value: ABUNDANT WBC PRESENT, PREDOMINANTLY PMN     RARE SQUAMOUS EPITHELIAL CELLS PRESENT     FEW GRAM POSITIVE COCCI     IN PAIRS FEW GRAM POSITIVE RODS     FEW YEAST   Culture Culture reincubated for better growth   Final    Report Status PENDING   Incomplete     Coagulation Studies:  Basename 06/05/12 0033  LABPROT 15.8*  INR 1.29    Urinalysis:  Basename 06/04/12 2332  COLORURINE AMBER*  LABSPEC 1.019  PHURINE 5.5  GLUCOSEU NEGATIVE  HGBUR MODERATE*  BILIRUBINUR SMALL*  KETONESUR 15*  PROTEINUR >300*  UROBILINOGEN 0.2  NITRITE NEGATIVE  LEUKOCYTESUR MODERATE*      Imaging: Dg Chest Port 1 View  06/07/2012  *RADIOLOGY REPORT*  Clinical Data: Checked ETT  PORTABLE CHEST - 1 VIEW  Comparison: 06/06/2012  Findings: Cardiomegaly with pulmonary vascular congestion and mild interstitial edema.  Bibasilar opacities, likely atelectasis.  No pneumothorax.  Endotracheal tube terminates 3.5 cm above the carina.  Stable right IJ venous catheter.  Enteric tube courses below the diaphragm.  IMPRESSION: Cardiomegaly with mild interstitial edema.  Stable support apparatus as above.   Original Report Authenticated By: Charline Bills, M.D.    Dg Chest Port 1 View  06/06/2012  *RADIOLOGY REPORT*  Clinical Data: Respiratory failure.  PORTABLE CHEST - 1 VIEW  Comparison: 06/05/2012.  Findings: Endotracheal tube is in satisfactory position. Nasogastric tube is followed into the stomach.  Right IJ dialysis catheter tip projects over the SVC.  Heart is enlarged, as before. Suspect pulmonary arterial hypertension.  Diffuse interstitial prominence and indistinctness with bibasilar atelectasis.  Probable left pleural effusion.  IMPRESSION: Congestive heart failure and bibasilar air space disease, stable.   Original Report Authenticated By: Leanna Battles, M.D.      Medications:      . sodium chloride 20 mL/hr at 06/07/12 1900  . dialysis replacement fluid (prismasate) Stopped (06/06/12 1323)    . dialysis replacement fluid (prismasate) Stopped (06/06/12 1322)  . dialysate (PRISMASATE) Stopped (06/06/12 1323)      . albuterol  6 puff Inhalation Q6H  . ciprofloxacin  400 mg Intravenous Q12H  .  escitalopram  20 mg Oral Daily  . feeding supplement (NEPRO CARB STEADY)  1,000 mL Per Tube Q24H  . feeding supplement  30 mL Per Tube 6 X Daily  . heparin  5,000 Units Subcutaneous Q8H  . metronidazole  250 mg Intravenous Q8H  . midodrine  10 mg Oral TID WC  . multivitamin  1 tablet Oral QHS  . OxyCODONE  60 mg Oral Q8H  . pantoprazole (PROTONIX) IV  40 mg Intravenous Q24H  . sevelamer  1,600 mg Oral TID WC  . sodium chloride  3 mL Intravenous Q12H  . vancomycin  1,500 mg Intravenous Q24H   albuterol, feeding supplement (NEPRO CARB STEADY), fentaNYL, heparin, midazolam, ondansetron, sevelamer, sodium chloride  Assessment/ Plan:  ESRD TTS  Shock improved off pressors Metabolic acidosis and electrolytes stable  Anemia hb 9's  Plan hd in am   LOS: 3 Dayanara Sherrill W @TODAY @9 :43 PM

## 2012-06-07 NOTE — ED Provider Notes (Signed)
Medical screening examination/treatment/procedure(s) were performed by non-physician practitioner and as supervising physician I was immediately available for consultation/collaboration.   Wiatt Mahabir M Shavawn Stobaugh, DO 06/07/12 0758 

## 2012-06-07 NOTE — Progress Notes (Signed)
PULMONARY  / CRITICAL CARE MEDICINE  Name: [name] MRN: [id number] DOB: [birth year]    LOS: 3 days  REFERRING MD :  [facility]  CHIEF COMPLAINT:  Altered mental status & deafness   HISTORY OF PRESENT ILLNESS:  74 /M morbidly obese, ESRD - on HD T/T/S BIBEMS for chief c/o acute onset decreased hearing and tinnitus.As per his wife he has been afebrile, not complaining of pain, not coughing. Labs showed leucocytosis & elevated liver enzymes with nml bili & INR. Of note nml LFTs in 11/13 with nml hep profile. Low BP recorded intermittently ? Cuff issues , 1.5 L fluid given, empiric abx & admitted to ICU for potential CVVH. Of note, head CT neg & US abdomen - Status post cholecystectomy and right nephrectomy.  Large fluid collection in the right renal fossa correlates with liquefaction of hematoma seen on prior CT scan. PMH - adm 10/13 for retroperitoneal hematoma, has IVC filter with DVT  LEVEL OF CARE:  ICU PRIMARY SERVICE:  PCCM CONSULTANTS:  renal CODE STATUSfull DIET:  npo DVT Px:  Sq heaprin, IVC filter GI Px:  protonix     LINES / TUBES: ETT 06/06/11 Rt IJ HD trialysis cath 06/06/11>>  CULTURES: Blood 1/7 >>  Results for orders placed during the hospital encounter of 06/04/12  CULTURE, BLOOD (ROUTINE X 2)     Status: Normal (Preliminary result)   Collection Time   06/04/12 11:58 PM      Component Value Range Status Comment   Specimen Description BLOOD RIGHT ARM   Final    Special Requests BOTTLES DRAWN AEROBIC AND ANAEROBIC 10CC EACH   Final    Culture  Setup Time 06/05/2012 04:09   Final    Culture     Final    Value:        BLOOD CULTURE RECEIVED NO GROWTH TO DATE CULTURE WILL BE HELD FOR 5 DAYS BEFORE ISSUING A FINAL NEGATIVE REPORT   Report Status PENDING   Incomplete   CULTURE, BLOOD (ROUTINE X 2)     Status: Normal (Preliminary result)   Collection Time   06/05/12 12:05 AM      Component Value Range Status Comment   Specimen Description BLOOD RIGHT HAND   Final    Special Requests BOTTLES DRAWN AEROBIC AND ANAEROBIC 10CC EACH   Final    Culture  Setup Time 06/05/2012 04:09   Final    Culture     Final    Value:        BLOOD CULTURE RECEIVED NO GROWTH TO DATE CULTURE WILL BE HELD FOR 5 DAYS BEFORE ISSUING A FINAL NEGATIVE REPORT   Report Status PENDING   Incomplete   MRSA PCR SCREENING     Status: Normal   Collection Time   06/05/12  6:53 AM      Component Value Range Status Comment   MRSA by PCR NEGATIVE  NEGATIVE Final   CULTURE, RESPIRATORY     Status: Normal (Preliminary result)   Collection Time   06/05/12  4:12 PM      Component Value Range Status Comment   Specimen Description TRACHEAL ASPIRATE   Final    Special Requests NONE   Final    Gram Stain     Final    Value: ABUNDANT WBC PRESENT, PREDOMINANTLY PMN     RARE SQUAMOUS EPITHELIAL CELLS PRESENT     FEW GRAM POSITIVE COCCI     IN PAIRS FEW GRAM POSITIVE RODS  FEW YEAST   Culture Culture reincubated for better growth   Final    Report Status PENDING   Incomplete      ANTIBIOTICS: cipro 1/7 >> vanc 1/7 >> Flagyl 1/7 >>  Anti-infectives     Start     Dose/Rate Route Frequency Ordered Stop   06/06/12 0800   vancomycin (VANCOCIN) 1,500 mg in sodium chloride 0.9 % 250 mL IVPB        1,500 mg 125 mL/hr over 120 Minutes Intravenous Every 24 hours 06/05/12 0901     06/06/12 0600   ciprofloxacin (CIPRO) IVPB 400 mg  Status:  Discontinued        400 mg 200 mL/hr over 60 Minutes Intravenous Every 24 hours 06/05/12 0638 06/05/12 0900   06/06/12 0000   ciprofloxacin (CIPRO) IVPB 400 mg  Status:  Discontinued        400 mg 200 mL/hr over 60 Minutes Intravenous Every 24 hours 06/05/12 0532 06/05/12 0638   06/05/12 1700   ciprofloxacin (CIPRO) IVPB 400 mg        400 mg 200 mL/hr over 60 Minutes Intravenous Every 12 hours 06/05/12 0901     06/05/12 1400   metroNIDAZOLE (FLAGYL) IVPB 250 mg        250 mg 50 mL/hr over 60 Minutes Intravenous Every 8 hours 06/05/12 0532     06/05/12  0730   vancomycin (VANCOCIN) 1,500 mg in sodium chloride 0.9 % 500 mL IVPB        1,500 mg 250 mL/hr over 120 Minutes Intravenous  Once 06/05/12 0639 06/05/12 1041   06/05/12 0315   vancomycin (VANCOCIN) IVPB 1000 mg/200 mL premix        1,000 mg 200 mL/hr over 60 Minutes Intravenous  Once 06/05/12 0311 06/05/12 0501   06/05/12 0315   ciprofloxacin (CIPRO) IVPB 400 mg        400 mg 200 mL/hr over 60 Minutes Intravenous  Once 06/05/12 0311 06/05/12 0607   06/05/12 0315   metroNIDAZOLE (FLAGYL) IVPB 500 mg        500 mg 100 mL/hr over 60 Minutes Intravenous  Once 06/05/12 0311 06/05/12 0711           SIGNIFICANT EVENTS:  1/7 low BP- tr to 2300. Intubated due to resp acidosis. On CVVH. On pressors 06/07/11: Off pressors. Hearing is regained equally and bialterally. Normal WUA. Doing well on SBt and looked good for extubation but showed resp acidosis   SUBJECTIVE/OVERNIGHT/INTERVAL HX 06/08/11: WENT BACK on pressors yesterday but off levophed as of last night. Doing SBT this am. Looks good. ABG pending.    VITAL SIGNS: Temp:  [97.4 F (36.3 C)-99 F (37.2 C)] 98.3 F (36.8 C) (01/09 0737) Pulse Rate:  [58-92] 68  (01/09 0722) Resp:  [13-24] 20  (01/09 0722) BP: (79-136)/(40-57) 119/54 mmHg (01/09 0700) SpO2:  [89 %-100 %] 97 % (01/09 0722) Arterial Line BP: (49-160)/(37-77) 65/61 mmHg (01/09 0700) FiO2 (%):  [39.5 %-40.8 %] 40 % (01/09 0722) Weight:  [154 kg (339 lb 8.1 oz)] 154 kg (339 lb 8.1 oz) (01/09 0500) HEMODYNAMICS: CVP:  [12 mmHg-13 mmHg] 12 mmHg VENTILATOR SETTINGS: Vent Mode:  [-] CPAP FiO2 (%):  [39.5 %-40.8 %] 40 % Set Rate:  [16 bmp] 16 bmp Vt Set:  [620 mL] 620 mL PEEP:  [4.6 cmH20-5 cmH20] 5 cmH20 Pressure Support:  [5 cmH20] 5 cmH20 Plateau Pressure:  [19 cmH20-28 cmH20] 20 cmH20 INTAKE / OUTPUT: Intake/Output  01/08 0701 - 01/09 0700 01/09 0701 - 01/10 0700   I.V. (mL/kg) 503.6 (3.3)    NG/GT 740    IV Piggyback 800    Total Intake(mL/kg)  2043.6 (13.3)    Emesis/NG output     Other 534    Total Output 534    Net +1509.6           PHYSICAL EXAMINATION: Gen. obese, in no distress, normal affect ENT - no lesions, no post nasal drip. NORMAL HEARING. TRACH SCAR + Neck: No JVD, no thyromegaly, no carotid bruits Lungs: no use of accessory muscles, no dullness to percussion, clear without rales or rhonchi  Cardiovascular: Rhythm regular, heart sounds  normal, no murmurs, no peripheral edema, no rub Abdomen: soft and non-tender, no hepatosplenomegaly, BS normal., scars midline & of GB drains Musculoskeletal: No deformities, no cyanosis or clubbing, LUE AVF- good thrill Neuro:  alert, non focal. NORMAL WUA Skin:  Warm, no lesions/ rash   LABS: Cbc  Lab 06/06/12 0415 06/05/12 0532 06/05/12 0033  WBC 17.6* -- --  HGB 9.7* 9.5* 10.3*  HCT 31.0* 30.9* 33.5*  PLT 185 203 237    Chemistry   Lab 06/06/12 1626 06/06/12 0415 06/05/12 1648  NA 136 137 139  K 3.7 3.9 3.9  CL 101 98 99  CO2 21 21 20   BUN 32* 36* 47*  CREATININE 4.06* 4.68* 6.03*  CALCIUM 8.8 9.1 8.5  MG -- -- --  PHOS 2.9 3.7 5.5*  GLUCOSE 124* 124* 150*    Liver fxn  Lab 06/06/12 1626 06/06/12 0415 06/05/12 1648 06/05/12 0532 06/04/12 2226  AST -- 416* -- 1499* 2606*  ALT -- 1154* -- 1435* 1654*  ALKPHOS -- 122* -- 108 120*  BILITOT -- 0.4 -- 0.3 0.3  PROT -- 7.3 -- 6.8 7.7  ALBUMIN 2.4* 2.8* 2.7* -- --   coags  Lab 06/05/12 0033  APTT --  INR 1.29   Sepsis markers  Lab 06/07/12 0356 06/06/12 1115 06/05/12 0309  LATICACIDVEN -- -- 1.6  PROCALCITON 3.62 3.10 --   Cardiac markers  Lab 06/05/12 2124 06/05/12 1648 06/05/12 0830  CKTOTAL -- -- --  CKMB -- -- --  TROPONINI 0.91* 1.21* 1.44*   BNP No results found for this basename: PROBNP:3 in the last 168 hours ABG  Lab 06/06/12 1034 06/06/12 0927 06/05/12 1650  PHART 7.320* 7.202* 7.222*  PCO2ART 48.7* 64.9* 55.2*  PO2ART 73.0* 81.0 164.0*  HCO3 25.2* 25.6* 22.8  TCO2 27 28  24     CBG trend  Lab 06/07/12 0740 06/06/12 2312 06/06/12 1533 06/06/12 0741 06/05/12 2257  GLUCAP 116* 111* 107* 122* 112*    Dg Chest Port 1 View  06/07/2012  *RADIOLOGY REPORT*  Clinical Data: Checked ETT  PORTABLE CHEST - 1 VIEW  Comparison: 06/06/2012  Findings: Cardiomegaly with pulmonary vascular congestion and mild interstitial edema.  Bibasilar opacities, likely atelectasis.  No pneumothorax.  Endotracheal tube terminates 3.5 cm above the carina.  Stable right IJ venous catheter.  Enteric tube courses below the diaphragm.  IMPRESSION: Cardiomegaly with mild interstitial edema.  Stable support apparatus as above.   Original Report Authenticated By: Charline Bills, M.D.    Dg Chest Port 1 View  06/06/2012  *RADIOLOGY REPORT*  Clinical Data: Respiratory failure.  PORTABLE CHEST - 1 VIEW  Comparison: 06/05/2012.  Findings: Endotracheal tube is in satisfactory position. Nasogastric tube is followed into the stomach.  Right IJ dialysis catheter tip projects over the SVC.  Heart is  enlarged, as before. Suspect pulmonary arterial hypertension.  Diffuse interstitial prominence and indistinctness with bibasilar atelectasis.  Probable left pleural effusion.  IMPRESSION: Congestive heart failure and bibasilar air space disease, stable.   Original Report Authenticated By: Leanna Battles, M.D.    Dg Chest Port 1 View  06/05/2012  *RADIOLOGY REPORT*  Clinical Data: 47 year old male with respiratory difficulty - intubation.  PORTABLE CHEST - 1 VIEW  Comparison: 06/05/2012 and prior chest radiographs  Findings: An endotracheal tube is now identified with tip 1.3 cm above the carina - consider 1 cm retraction. An NG tube is present entering the stomach with tip off the field of view. A right IJ central venous catheter is again noted with tip overlying the mid - lower SVC. Cardiomegaly and increasing bilateral lower lung atelectasis/consolidation noted. There is no evidence of pneumothorax. No other changes  noted.  IMPRESSION: Support apparatus as described - consider slight retraction (1 cm) of endotracheal tube.  Increasing bilateral lower lung atelectasis/consolidation.   Original Report Authenticated By: Harmon Pier, M.D.    Dg Chest Port 1 View  06/05/2012  *RADIOLOGY REPORT*  Clinical Data: Central line placement  PORTABLE CHEST - 1 VIEW  Comparison: 06/04/2012  Findings: Stable cardiomegaly and prominent bilateral hila likely related to some degree of pulmonary artery enlargement bilaterally. This is similar to the prior study.  There is a new right central venous line via internal jugular approach.  Central line distally projects to the left of the spine.  The patient is somewhat rotated toward the left.  IMPRESSION: No pneumothorax.  Cannot verify appropriate venous positioning of central line.  Based on central line contour, arterial positioning is not excluded.  This may be due to patient rotation.   Original Report Authenticated By: Esperanza Heir, M.D.      DIAGNOSES: Principal Problem:  *Transaminitis Active Problems:  ESRD (end stage renal disease) on dialysis  Hearing loss  Septic shock(785.52)  Acute respiratory failure   ASSESSMENT / PLAN: Urosepsis, elevated liver enzymes ? Shock liver & acute onset deafness? Due to hypotension Hypercarbic resp failure  PULMONARY  ASSESSMENT:w/f edema/ fluid overload OSA Hypercarbic resp failure - could not extubate 06/07/11 due to resp acidosis during weaning  On 06/07/2012: Looks good for extubation   PLAN:  Check abg on PSV and if good, extubate    CARDIOVASCULAR No results found for this basename: PROBNP:5 in the last 168 hours    Lab 06/05/12 2124 06/05/12 1648 06/05/12 0830  TROPONINI 0.91* 1.21* 1.44*     ASSESSMENT: hypotension needing midodrine and pressors. Ass mild trop leak at admi but echo 06/07/11 is normal On 06/07/2012: Off pressors since 06/07/11 pm. ECHO 06/07/11: Normal  PLAN:  Midodrine per  renal   RENAL  ASSESSMENT:  ESRD On 06/07/2012: s/p CVVH 06/06/11 to 06/07/11  PLAN:   Per renal  GASTROINTESTINAL  Lab 06/06/12 1626 06/06/12 0415 06/05/12 1648 06/05/12 0532 06/05/12 0033 06/04/12 2226  AST -- 416* -- 1499* -- 2606*  ALT -- 1154* -- 1435* -- 1654*  ALKPHOS -- 122* -- 108 -- 120*  BILITOT -- 0.4 -- 0.3 -- 0.3  PROT -- 7.3 -- 6.8 -- 7.7  ALBUMIN 2.4* 2.8* 2.7* 2.7* -- 3.0*  INR -- -- -- -- 1.29 --      ASSESSMENT:  Elevated liver enzymes ? Medication induced, hep profile neg 11/13, neg tylenol PLAN:   Start tube feeds Supportive care Recheck lft 1.10/13  HEMATOLOGIC\  Lab 06/06/12 0415 06/05/12 0532 06/05/12 0033  HGB 9.7* 9.5* 10.3*  HCT 31.0* 30.9* 33.5*  WBC 17.6* 17.7* 22.1*  PLT 185 203 237    Lab 06/05/12 0033  INR 1.29     ASSESSMENT:  DVT 10/13 s/p IVC filter, h/o retroperitoneal bleed PLAN:  Sq heaprin only - PRBC for hgb </= 6.9gm%    - exceptions are   -  if ACS susepcted/confirmed then transfuse for hgb </= 8.0gm%,  or    -  If septic shock first 24h and scvo2 < 70% then transfuse for hgb </= 9.0gm%   - active bleeding with hemodynamic instability, then transfuse regardless of hemoglobin value   At at all times try to transfuse 1 unit prbc as possible with exception of active hemorrhage    INFECTIOUS  Lab 06/07/12 0356 06/06/12 1115  PROCALCITON 3.62 3.10     ASSESSMENT:  Unclear source ? UTI PLAN:   Empiric cipro/ flagyl/vanc PCT check  ENDOCRINE  ASSESSMENT:  npo   PLAN:   CBG checks  NEUROLOGIC  ASSESSMENT:  Deafness, acute onset ? Hypotension related- no ototoxins given. No ear wax  On 06/07/2012: hearning normal since 06/07/11. Deafness likely low bp related  PLAN:   monitor   CRITICAL CARE: The patient is critically ill with multiple organ systems failure and requires high complexity decision making for assessment and support, frequent evaluation and titration of therapies, application of advanced  monitoring technologies and extensive interpretation of multiple databases. Critical Care Time devoted to patient care services described in this note is .     Dr. Kalman Shan, M.D., [facility].C.P Pulmonary and Critical Care Medicine Staff Physician [facility] [place] [facility] Pager: [phone], If no answer or between  15:00h - 7:00h: call [phone]  06/07/2012 8:31 AM

## 2012-06-07 NOTE — Evaluation (Addendum)
Clinical/Bedside Swallow Evaluation Patient Details  Name: [name] MRN: [id number] Date of Birth: July 27, 1965  Today's Date: 06/07/2012 Time: 7829-5621 SLP Time Calculation (min): 18 min  Past Medical History:  Past Medical History  Diagnosis Date  . Hypertension   . GERD (gastroesophageal reflux disease)   . DVT (deep venous thrombosis)   . Renal disorder   . Complication of anesthesia     " I am hard  to wake up "  . ESRD on hemodialysis     Dialysis at [facility] in [place], [place]. Started dialysis in 2010. ESRD due to DM/HTN.  . Diabetes mellitus    Past Surgical History:  Past Surgical History  Procedure Date  . Gastric sleeve   . Kidney stones   . Dialysis port   . Compression hip screw 08/26/2011    Procedure: COMPRESSION HIP;  Surgeon: Velna Ochs, MD;  Location: [facility] OR;  Service: Orthopedics;  Laterality: Left;  DYNAMIC HIP SCREW COMPRESSION  . Ivc filter   . Cholecystostomy tube    HPI:  76 /M morbidly obese, ESRD - on HD T/T/S BIBEMS for chief c/o acute onset decreased hearing and tinnitus.As per his wife he has been afebrile, not complaining of pain, not coughing. Labs showed leucocytosis & elevated liver enzymes with nml bili & INR. Of note nml LFTs in 11/13 with nml hep profile. On day of admission 06/05/12 low BP- Intubated due to resp acidosis. On CVVH. Hearing regained 1/8, extubated in am of 1/9.    Assessment / Plan / Recommendation Clinical Impression  Pt demonstrated swallow function WNL. Minimal risk of silent aspiration post extubation. Recommend initiating regular diet, thin liquids. No SLP f/u needed.     Aspiration Risk  Mild    Diet Recommendation Regular;Thin liquid   Liquid Administration via: Cup;Straw Medication Administration: Whole meds with liquid Supervision: Patient able to self feed Postural Changes and/or Swallow Maneuvers: Seated upright 90 degrees    Other  Recommendations Oral Care Recommendations: Oral care BID    Follow Up Recommendations  None    Frequency and Duration        Pertinent Vitals/Pain NA    SLP Swallow Goals     Swallow Study Prior Functional Status       General HPI: 49 /M morbidly obese, ESRD - on HD T/T/S BIBEMS for chief c/o acute onset decreased hearing and tinnitus.As per his wife he has been afebrile, not complaining of pain, not coughing. Labs showed leucocytosis & elevated liver enzymes with nml bili & INR. Of note nml LFTs in 11/13 with nml hep profile. On day of admission 06/05/12 low BP- Intubated due to resp acidosis. On CVVH. Hearing regained 1/8, extubated in am of 1/9.  Type of Study: Bedside swallow evaluation Previous Swallow Assessment: none Diet Prior to this Study: NPO Temperature Spikes Noted: No Respiratory Status: Room air History of Recent Intubation: Yes Length of Intubations (days): 2 days Date extubated: 06/07/12 Behavior/Cognition: Cooperative;Lethargic;Pleasant mood Oral Cavity - Dentition: Adequate natural dentition Self-Feeding Abilities: Able to feed self Patient Positioning: Upright in bed Baseline Vocal Quality: Hoarse Volitional Cough: Strong Volitional Swallow: Able to elicit    Oral/Motor/Sensory Function Overall Oral Motor/Sensory Function: Appears within functional limits for tasks assessed   Ice Chips     Thin Liquid Thin Liquid: Within functional limits Presentation: Cup;Straw;Self Fed    Nectar Thick Nectar Thick Liquid: Not tested   Honey Thick Honey Thick Liquid: Not tested   Puree Puree: Within functional limits  Solid   GO    Solid: Within functional limits       Yunique Dearcos, Riley Nearing 06/07/2012,4:34 PM

## 2012-06-07 NOTE — Procedures (Signed)
Extubation Procedure Note  Patient Details:   Name: [name] DOB: 1965-09-03 MRN: [id number]   Airway Documentation:     Evaluation  O2 sats: stable throughout and currently acceptable Complications: No apparent complications Patient did tolerate procedure well. Bilateral Breath Sounds: Clear Suctioning: Airway Yes Pt awake and alert. Extubated per MD order, placed on 3L [place], sat 95%. Positive cuff leak, BBS coarse. Pt able to vocalize.  Arloa Koh 06/07/2012, 8:55 AM

## 2012-06-08 LAB — IRON AND TIBC
Saturation Ratios: 8 % — ABNORMAL LOW (ref 20–55)
TIBC: 201 ug/dL — ABNORMAL LOW (ref 215–435)
UIBC: 184 ug/dL (ref 125–400)

## 2012-06-08 LAB — BLOOD GAS, ARTERIAL
Drawn by: 31996
TCO2: 26.8 mmol/L (ref 0–100)
pCO2 arterial: 56 mmHg — ABNORMAL HIGH (ref 35.0–45.0)
pH, Arterial: 7.272 — ABNORMAL LOW (ref 7.350–7.450)
pO2, Arterial: 65.9 mmHg — ABNORMAL LOW (ref 80.0–100.0)

## 2012-06-08 LAB — CBC
HCT: 27.9 % — ABNORMAL LOW (ref 39.0–52.0)
Hemoglobin: 8.5 g/dL — ABNORMAL LOW (ref 13.0–17.0)
MCH: 30 pg (ref 26.0–34.0)
MCV: 98.6 fL (ref 78.0–100.0)
Platelets: 138 10*3/uL — ABNORMAL LOW (ref 150–400)
RBC: 2.83 MIL/uL — ABNORMAL LOW (ref 4.22–5.81)
WBC: 13.1 10*3/uL — ABNORMAL HIGH (ref 4.0–10.5)

## 2012-06-08 LAB — GLUCOSE, CAPILLARY
Glucose-Capillary: 88 mg/dL (ref 70–99)
Glucose-Capillary: 93 mg/dL (ref 70–99)

## 2012-06-08 LAB — COMPREHENSIVE METABOLIC PANEL
CO2: 23 mEq/L (ref 19–32)
Calcium: 8.9 mg/dL (ref 8.4–10.5)
Chloride: 99 mEq/L (ref 96–112)
Creatinine, Ser: 5.04 mg/dL — ABNORMAL HIGH (ref 0.50–1.35)
GFR calc Af Amer: 15 mL/min — ABNORMAL LOW (ref >90)
GFR calc non Af Amer: 13 mL/min — ABNORMAL LOW (ref >90)
Glucose, Bld: 100 mg/dL — ABNORMAL HIGH (ref 70–99)
Total Bilirubin: 0.3 mg/dL (ref 0.3–1.2)

## 2012-06-08 LAB — VANCOMYCIN, TROUGH: Vancomycin Tr: 44.4 ug/mL (ref 10.0–20.0)

## 2012-06-08 LAB — PROCALCITONIN: Procalcitonin: 2.64 ng/mL

## 2012-06-08 MED ORDER — CIPROFLOXACIN IN D5W 400 MG/200ML IV SOLN
400.0000 mg | INTRAVENOUS | Status: DC
  Administered 2012-06-09: 400 mg via INTRAVENOUS
  Filled 2012-06-08: qty 200

## 2012-06-08 MED ORDER — MIDODRINE HCL 5 MG PO TABS
ORAL_TABLET | ORAL | Status: AC
  Administered 2012-06-08: 10 mg via ORAL
  Filled 2012-06-08: qty 2

## 2012-06-08 MED ORDER — HEPARIN SODIUM (PORCINE) 1000 UNIT/ML DIALYSIS: 20.0000 [IU]/kg | INTRAMUSCULAR | Status: DC | PRN

## 2012-06-08 MED ORDER — ALTEPLASE 2 MG IJ SOLR
2.0000 mg | Freq: Once | INTRAMUSCULAR | Status: AC | PRN
  Filled 2012-06-08: qty 2

## 2012-06-08 MED ORDER — ALBUTEROL SULFATE HFA 108 (90 BASE) MCG/ACT IN AERS
2.0000 | INHALATION_SPRAY | Freq: Four times a day (QID) | RESPIRATORY_TRACT | Status: DC
  Administered 2012-06-08 – 2012-06-12 (×12): 2 via RESPIRATORY_TRACT
  Filled 2012-06-08: qty 6.7

## 2012-06-08 MED ORDER — PENTAFLUOROPROP-TETRAFLUOROETH EX AERO: 1.0000 "application " | INHALATION_SPRAY | CUTANEOUS | Status: DC | PRN

## 2012-06-08 MED ORDER — LIDOCAINE-PRILOCAINE 2.5-2.5 % EX CREA
1.0000 "application " | TOPICAL_CREAM | CUTANEOUS | Status: DC | PRN
  Filled 2012-06-08: qty 5

## 2012-06-08 MED ORDER — NEPRO/CARBSTEADY PO LIQD
237.0000 mL | ORAL | Status: DC | PRN
  Filled 2012-06-08: qty 237

## 2012-06-08 MED ORDER — SODIUM CHLORIDE 0.9 % IV SOLN: 100.0000 mL | INTRAVENOUS | Status: DC | PRN

## 2012-06-08 MED ORDER — HEPARIN SODIUM (PORCINE) 1000 UNIT/ML DIALYSIS: 1000.0000 [IU] | INTRAMUSCULAR | Status: DC | PRN

## 2012-06-08 MED ORDER — DARBEPOETIN ALFA-POLYSORBATE 150 MCG/0.3ML IJ SOLN
150.0000 ug | INTRAMUSCULAR | Status: DC
  Administered 2012-06-09: 150 ug via INTRAVENOUS
  Filled 2012-06-08 (×2): qty 0.3

## 2012-06-08 MED ORDER — LIDOCAINE HCL (PF) 1 % IJ SOLN: 5.0000 mL | INTRAMUSCULAR | Status: DC | PRN

## 2012-06-08 NOTE — Progress Notes (Signed)
PULMONARY  / CRITICAL CARE MEDICINE  Name: [name] MRN: [id number] DOB: 03/14/1966    LOS: 4 days  REFERRING MD :  [facility]  CHIEF COMPLAINT:  Altered mental status & deafness   HISTORY OF PRESENT ILLNESS:  79 /M morbidly obese, ESRD - on HD T/T/S BIBEMS for chief c/o acute onset decreased hearing and tinnitus.As per his wife he has been afebrile, not complaining of pain, not coughing. Labs showed leucocytosis & elevated liver enzymes with nml bili & INR. Of note nml LFTs in 11/13 with nml hep profile. Of note, patient in resp acidosis at admit and took more opiates than usual   Low BP recorded intermittently ? Cuff issues , 1.5 L fluid given, empiric abx & admitted to ICU for potential CVVH. Of note, head CT neg & US abdomen - Status post cholecystectomy and right nephrectomy.  Large fluid collection in the right renal fossa correlates with liquefaction of hematoma seen on prior CT scan. PMH - adm 10/13 for retroperitoneal hematoma, has IVC filter with DVT  LEVEL OF CARE:  ICU PRIMARY SERVICE:  PCCM CONSULTANTS:  renal CODE STATUSfull DIET:  npo DVT Px:  Sq heaprin, IVC filter GI Px:  protonix     LINES / TUBES: ETT 06/06/11 Rt IJ HD trialysis cath 06/06/11>>  CULTURES: Blood 1/7 >>  Results for orders placed during the hospital encounter of 06/04/12  CULTURE, BLOOD (ROUTINE X 2)     Status: Normal (Preliminary result)   Collection Time   06/04/12 11:58 PM      Component Value Range Status Comment   Specimen Description BLOOD RIGHT ARM   Final    Special Requests BOTTLES DRAWN AEROBIC AND ANAEROBIC 10CC EACH   Final    Culture  Setup Time 06/05/2012 04:09   Final    Culture     Final    Value:        BLOOD CULTURE RECEIVED NO GROWTH TO DATE CULTURE WILL BE HELD FOR 5 DAYS BEFORE ISSUING A FINAL NEGATIVE REPORT   Report Status PENDING   Incomplete   CULTURE, BLOOD (ROUTINE X 2)     Status: Normal (Preliminary result)   Collection Time   06/05/12 12:05 AM      Component  Value Range Status Comment   Specimen Description BLOOD RIGHT HAND   Final    Special Requests BOTTLES DRAWN AEROBIC AND ANAEROBIC 10CC EACH   Final    Culture  Setup Time 06/05/2012 04:09   Final    Culture     Final    Value:        BLOOD CULTURE RECEIVED NO GROWTH TO DATE CULTURE WILL BE HELD FOR 5 DAYS BEFORE ISSUING A FINAL NEGATIVE REPORT   Report Status PENDING   Incomplete   MRSA PCR SCREENING     Status: Normal   Collection Time   06/05/12  6:53 AM      Component Value Range Status Comment   MRSA by PCR NEGATIVE  NEGATIVE Final   CULTURE, RESPIRATORY     Status: Normal (Preliminary result)   Collection Time   06/05/12  4:12 PM      Component Value Range Status Comment   Specimen Description TRACHEAL ASPIRATE   Final    Special Requests NONE   Final    Gram Stain     Final    Value: ABUNDANT WBC PRESENT, PREDOMINANTLY PMN     RARE SQUAMOUS EPITHELIAL CELLS PRESENT     FEW  GRAM POSITIVE COCCI     IN PAIRS FEW GRAM POSITIVE RODS     FEW YEAST   Culture Culture reincubated for better growth   Final    Report Status PENDING   Incomplete      ANTIBIOTICS: cipro 1/7 >> vanc 1/7 >> Flagyl 1/7 >>  Anti-infectives     Start     Dose/Rate Route Frequency Ordered Stop   06/06/12 0800   vancomycin (VANCOCIN) 1,500 mg in sodium chloride 0.9 % 250 mL IVPB  Status:  Discontinued        1,500 mg 125 mL/hr over 120 Minutes Intravenous Every 24 hours 06/05/12 0901 06/08/12 0856   06/06/12 0600   ciprofloxacin (CIPRO) IVPB 400 mg  Status:  Discontinued        400 mg 200 mL/hr over 60 Minutes Intravenous Every 24 hours 06/05/12 0638 06/05/12 0900   06/06/12 0000   ciprofloxacin (CIPRO) IVPB 400 mg  Status:  Discontinued        400 mg 200 mL/hr over 60 Minutes Intravenous Every 24 hours 06/05/12 0532 06/05/12 0638   06/05/12 1700   ciprofloxacin (CIPRO) IVPB 400 mg        400 mg 200 mL/hr over 60 Minutes Intravenous Every 12 hours 06/05/12 0901     06/05/12 1400   metroNIDAZOLE  (FLAGYL) IVPB 250 mg        250 mg 50 mL/hr over 60 Minutes Intravenous Every 8 hours 06/05/12 0532     06/05/12 0730   vancomycin (VANCOCIN) 1,500 mg in sodium chloride 0.9 % 500 mL IVPB        1,500 mg 250 mL/hr over 120 Minutes Intravenous  Once 06/05/12 0639 06/05/12 1041   06/05/12 0315   vancomycin (VANCOCIN) IVPB 1000 mg/200 mL premix        1,000 mg 200 mL/hr over 60 Minutes Intravenous  Once 06/05/12 0311 06/05/12 0501   06/05/12 0315   ciprofloxacin (CIPRO) IVPB 400 mg        400 mg 200 mL/hr over 60 Minutes Intravenous  Once 06/05/12 0311 06/05/12 0607   06/05/12 0315   metroNIDAZOLE (FLAGYL) IVPB 500 mg        500 mg 100 mL/hr over 60 Minutes Intravenous  Once 06/05/12 0311 06/05/12 0711           SIGNIFICANT EVENTS:  1/7 low BP- tr to 2300. Intubated due to resp acidosis. On CVVH. On pressors 06/07/11: Off pressors. Hearing is regained equally and bialterally. Normal WUA. Doing well on SBt and looked good for extubation but showed resp acidosis 06/08/11 - extubated  SUBJECTIVE/OVERNIGHT/INTERVAL HX   06/08/11: Got 60mg  oxycontin yesterday morning -> held last night due to drowsiness, given 60mg  again this morning -> drowsy  Again (d/w sister - baseline dose is the short acting oxycodone at half of what he got with [name] at 30mg  prn which he normally needs bid). Sister  DPOA says he behaves likes this when he gets lot of opiates   VITAL SIGNS: Temp:  [97.4 F (36.3 C)-98.2 F (36.8 C)] 97.6 F (36.4 C) (01/10 0812) Pulse Rate:  [57-76] 76  (01/10 0900) Resp:  [12-21] 20  (01/10 0900) BP: (90-149)/(38-86) 108/40 mmHg (01/10 0900) SpO2:  [93 %-99 %] 96 % (01/10 0900) Arterial Line BP: (51-86)/(34-56) 65/56 mmHg (01/09 1200) Weight:  [151.3 kg (333 lb 8.9 oz)] 151.3 kg (333 lb 8.9 oz) (01/10 0600) HEMODYNAMICS: CVP:  [6 mmHg-14 mmHg] 14 mmHg VENTILATOR SETTINGS:   INTAKE /  OUTPUT: Intake/Output      01/09 0701 - 01/10 0700 01/10 0701 - 01/11 0700   P.O. 100     I.V. (mL/kg) 430 (2.8) 20 (0.1)   NG/GT 120    IV Piggyback 800    Total Intake(mL/kg) 1450 (9.6) 20 (0.1)   Other     Total Output     Net +1450 +20          PHYSICAL EXAMINATION: Gen. obese, in no distress, normal affect ENT - no lesions, no post nasal drip. NORMAL HEARING. TRACH SCAR + Neck: No JVD, no thyromegaly, no carotid bruits Lungs: no use of accessory muscles, no dullness to percussion, clear without rales or rhonchi  Cardiovascular: Rhythm regular, heart sounds  normal, no murmurs, no peripheral edema, no rub Abdomen: soft and non-tender, no hepatosplenomegaly, BS normal., scars midline & of GB drains Musculoskeletal: No deformities, no cyanosis or clubbing, LUE AVF- good thrill Neuro:  ASTERIXIS +, RASS -1/-2 and ? confused Skin:  Warm, no lesions/ rash   LABS: Cbc  Lab 06/08/12 0725 06/06/12 0415 06/05/12 0532  WBC 13.1* -- --  HGB 8.5* 9.7* 9.5*  HCT 27.9* 31.0* 30.9*  PLT 138* 185 203    Chemistry   Lab 06/08/12 0645 06/06/12 1626 06/06/12 0415  NA 135 136 137  K 3.9 3.7 3.9  CL 99 101 98  CO2 23 21 21   BUN 50* 32* 36*  CREATININE 5.04* 4.06* 4.68*  CALCIUM 8.9 8.8 9.1  MG -- -- --  PHOS 4.7* 2.9 3.7  GLUCOSE 100* 124* 124*    Liver fxn  Lab 06/08/12 0645 06/06/12 1626 06/06/12 0415 06/05/12 0532  AST 61* -- 416* 1499*  ALT 501* -- 1154* 1435*  ALKPHOS 102 -- 122* 108  BILITOT 0.3 -- 0.4 0.3  PROT 6.6 -- 7.3 6.8  ALBUMIN 2.4* 2.4* 2.8* --   coags  Lab 06/05/12 0033  APTT --  INR 1.29   Sepsis markers  Lab 06/08/12 0645 06/07/12 0356 06/06/12 1115 06/05/12 0309  LATICACIDVEN -- -- -- 1.6  PROCALCITON 2.64 3.62 3.10 --   Cardiac markers  Lab 06/05/12 2124 06/05/12 1648 06/05/12 0830  CKTOTAL -- -- --  CKMB -- -- --  TROPONINI 0.91* 1.21* 1.44*   BNP No results found for this basename: PROBNP:3 in the last 168 hours ABG  Lab 06/07/12 0959 06/07/12 0837 06/06/12 1034  PHART 7.294* 7.319* 7.320*  PCO2ART 51.0* 51.7* 48.7*    PO2ART 62.0* 78.0* 73.0*  HCO3 24.8* 26.6* 25.2*  TCO2 26 28 27     CBG trend  Lab 06/08/12 0809 06/08/12 0358 06/08/12 0038 06/07/12 0740 06/06/12 2312  GLUCAP 85 79 72 116* 111*    Dg Chest Port 1 View  06/07/2012  *RADIOLOGY REPORT*  Clinical Data: Checked ETT  PORTABLE CHEST - 1 VIEW  Comparison: 06/06/2012  Findings: Cardiomegaly with pulmonary vascular congestion and mild interstitial edema.  Bibasilar opacities, likely atelectasis.  No pneumothorax.  Endotracheal tube terminates 3.5 cm above the carina.  Stable right IJ venous catheter.  Enteric tube courses below the diaphragm.  IMPRESSION: Cardiomegaly with mild interstitial edema.  Stable support apparatus as above.   Original Report Authenticated By: Charline Bills, M.D.    Dg Chest Port 1 View  06/06/2012  *RADIOLOGY REPORT*  Clinical Data: Respiratory failure.  PORTABLE CHEST - 1 VIEW  Comparison: 06/05/2012.  Findings: Endotracheal tube is in satisfactory position. Nasogastric tube is followed into the stomach.  Right IJ dialysis catheter tip  projects over the SVC.  Heart is enlarged, as before. Suspect pulmonary arterial hypertension.  Diffuse interstitial prominence and indistinctness with bibasilar atelectasis.  Probable left pleural effusion.  IMPRESSION: Congestive heart failure and bibasilar air space disease, stable.   Original Report Authenticated By: Leanna Battles, M.D.      DIAGNOSES: Principal Problem:  *Transaminitis Active Problems:  ESRD (end stage renal disease) on dialysis  Hearing loss  Septic shock(785.52)  Acute respiratory failure   ASSESSMENT / PLAN: Urosepsis, elevated liver enzymes ? Shock liver & acute onset deafness? Due to hypotension Hypercarbic resp failure  PULMONARY  ASSESSMENT:w/f edema/ fluid overload OSA Hypercarbic resp failure - could not extubate 06/07/11 due to resp acidosis during weaning. S/p extubation 06/08/11  On 06/08/2012: Maintaing airway though encephaloapthic post  oxycontin  PLAN:  Check abg stat; might need bipap    CARDIOVASCULAR No results found for this basename: PROBNP:5 in the last 168 hours    Lab 06/05/12 2124 06/05/12 1648 06/05/12 0830  TROPONINI 0.91* 1.21* 1.44*     ASSESSMENT: hypotension needing midodrine and pressors. Ass mild trop leak at admi but echo 06/07/11 is normal On 06/08/2012: Off pressors since 06/07/11 pm. ECHO 06/07/11: Normal  PLAN:  Midodrine per renal   RENAL  ASSESSMENT:  ESRD On 06/08/2012: s/p CVVH 06/06/11 to 06/07/11, Undergoing HD on 06/09/11  PLAN:   Per renal  GASTROINTESTINAL  Lab 06/08/12 0645 06/06/12 1626 06/06/12 0415 06/05/12 1648 06/05/12 0532 06/05/12 0033 06/04/12 2226  AST 61* -- 416* -- 1499* -- 2606*  ALT 501* -- 1154* -- 1435* -- 1654*  ALKPHOS 102 -- 122* -- 108 -- 120*  BILITOT 0.3 -- 0.4 -- 0.3 -- 0.3  PROT 6.6 -- 7.3 -- 6.8 -- 7.7  ALBUMIN 2.4* 2.4* 2.8* 2.7* 2.7* -- --  INR -- -- -- -- -- 1.29 --      ASSESSMENT:  Elevated liver enzymes ? Medication induced, hep profile neg 11/13, neg tylenol - on 06/08/2012: Improving LFT   PLAN:   Supportive care   HEMATOLOGIC\  Lab 06/08/12 0725 06/06/12 0415 06/05/12 0532  HGB 8.5* 9.7* 9.5*  HCT 27.9* 31.0* 30.9*  WBC 13.1* 17.6* 17.7*  PLT 138* 185 203    Lab 06/05/12 0033  INR 1.29     ASSESSMENT:  DVT 10/13 s/p IVC filter, h/o retroperitoneal bleed PLAN:  Sq heaprin only - PRBC for hgb </= 6.9gm%    - exceptions are   -  if ACS susepcted/confirmed then transfuse for hgb </= 8.0gm%,  or    -  If septic shock first 24h and scvo2 < 70% then transfuse for hgb </= 9.0gm%   - active bleeding with hemodynamic instability, then transfuse regardless of hemoglobin value   At at all times try to transfuse 1 unit prbc as possible with exception of active hemorrhage    INFECTIOUS  Lab 06/08/12 0645 06/07/12 0356 06/06/12 1115  PROCALCITON 2.64 3.62 3.10     ASSESSMENT:  Unclear source ? UTI PLAN:   Empiric cipro/  flagyl/vanc PCT check  ENDOCRINE  ASSESSMENT:  npo   PLAN:   CBG checks  NEUROLOGIC  ASSESSMENT:  - Baselione chronic back pain on oxycodone 30mg  bid prn. Dr Andris Baumann [phone] -  Deafness, acute onset ? Hypotension related- no ototoxins given. No ear wax  On 06/08/2012: hearning normal since 06/07/11. Deafness likely low bp related. However, got [place] oxycontin at double the home dose of short acting oxycodone and now enceophalopathic  PLAN:  Monitor Dc oxycontin Use fent prn Needs opd pain mgmt re-assessment (sister describes that xanax 'knocks him out')  GLOBAL  - keep in ICU due to opioid related enchelopatphy     CRITICAL CARE: The patient is critically ill with multiple organ systems failure and requires high complexity decision making for assessment and support, frequent evaluation and titration of therapies, application of advanced monitoring technologies and extensive interpretation of multiple databases. Critical Care Time devoted to patient care services described in this note is .     Dr. Kalman Shan, M.D., [facility].C.P Pulmonary and Critical Care Medicine Staff Physician [facility] Pager: [phone], If no answer or between  15:00h - 7:00h: call [phone]  06/08/2012 9:14 AM

## 2012-06-08 NOTE — Procedures (Signed)
I have seen and examined this patient and agree with the plan of care . Patient receiving intermittent 4 hour dialysis and has no complaints. BP 110 systolic with midodrine. Now off all pressors and tolerating ultrafiltration. AVF Left arm appears to be functioning well. Removing 3 l but will keep SBP above 85 [name] W 06/08/2012, 8:31 AM

## 2012-06-08 NOTE — Progress Notes (Signed)
ANTIBIOTIC CONSULT NOTE - INITIAL  Pharmacy Consult for vancomycin  Indication: r/o urosepsis  Allergies  Allergen Reactions  . Piperacillin Sod-Tazobactam So Hives    Patient Measurements: Height: 6' 0.05" (183 cm) Weight: 333 lb 8.9 oz (151.3 kg) IBW/kg (Calculated) : 77.71   Vital Signs: Temp: 98.3 F (36.8 C) (01/10 1130) Temp src: Oral (01/10 1130) BP: 109/50 mmHg (01/10 1100) Pulse Rate: 73  (01/10 1100) Intake/Output from previous day: 01/09 0701 - 01/10 0700 In: 1450 [P.O.:100; I.V.:430; NG/GT:120; IV Piggyback:800] Out: -  Intake/Output from this shift: Total I/O In: 80 [I.V.:80] Out: -   Labs:  Basename 06/08/12 0725 06/08/12 0645 06/06/12 1626 06/06/12 0415  WBC 13.1* -- -- 17.6*  HGB 8.5* -- -- 9.7*  PLT 138* -- -- 185  LABCREA -- -- -- --  CREATININE -- 5.04* 4.06* 4.68*   Estimated Creatinine Clearance: 28.3 ml/min (by C-G formula based on Cr of 5.04).  Basename 06/08/12 0715  VANCOTROUGH 44.4*  VANCOPEAK --  Drue Dun --  GENTTROUGH --  GENTPEAK --  GENTRANDOM --  TOBRATROUGH --  Nolen Mu --  TOBRARND --  AMIKACINPEAK --  AMIKACINTROU --  AMIKACIN --     Microbiology: Recent Results (from the past 720 hour(s))  CULTURE, BLOOD (ROUTINE X 2)     Status: Normal (Preliminary result)   Collection Time   06/04/12 11:58 PM      Component Value Range Status Comment   Specimen Description BLOOD RIGHT ARM   Final    Special Requests BOTTLES DRAWN AEROBIC AND ANAEROBIC 10CC EACH   Final    Culture  Setup Time 06/05/2012 04:09   Final    Culture     Final    Value:        BLOOD CULTURE RECEIVED NO GROWTH TO DATE CULTURE WILL BE HELD FOR 5 DAYS BEFORE ISSUING A FINAL NEGATIVE REPORT   Report Status PENDING   Incomplete   CULTURE, BLOOD (ROUTINE X 2)     Status: Normal (Preliminary result)   Collection Time   06/05/12 12:05 AM      Component Value Range Status Comment   Specimen Description BLOOD RIGHT HAND   Final    Special Requests BOTTLES  DRAWN AEROBIC AND ANAEROBIC 10CC EACH   Final    Culture  Setup Time 06/05/2012 04:09   Final    Culture     Final    Value:        BLOOD CULTURE RECEIVED NO GROWTH TO DATE CULTURE WILL BE HELD FOR 5 DAYS BEFORE ISSUING A FINAL NEGATIVE REPORT   Report Status PENDING   Incomplete   MRSA PCR SCREENING     Status: Normal   Collection Time   06/05/12  6:53 AM      Component Value Range Status Comment   MRSA by PCR NEGATIVE  NEGATIVE Final   CULTURE, RESPIRATORY     Status: Normal (Preliminary result)   Collection Time   06/05/12  4:12 PM      Component Value Range Status Comment   Specimen Description TRACHEAL ASPIRATE   Final    Special Requests NONE   Final    Gram Stain     Final    Value: ABUNDANT WBC PRESENT, PREDOMINANTLY PMN     RARE SQUAMOUS EPITHELIAL CELLS PRESENT     FEW GRAM POSITIVE COCCI     IN PAIRS FEW GRAM POSITIVE RODS     FEW YEAST   Culture  Final    Value: ABUNDANT STAPHYLOCOCCUS AUREUS     Note: RIFAMPIN AND GENTAMICIN SHOULD NOT BE USED AS SINGLE DRUGS FOR TREATMENT OF STAPH INFECTIONS.   Report Status PENDING   Incomplete     Medical History: Past Medical History  Diagnosis Date  . Hypertension   . GERD (gastroesophageal reflux disease)   . DVT (deep venous thrombosis)   . Renal disorder   . Complication of anesthesia     " I am hard  to wake up "  . ESRD on hemodialysis     Dialysis at [facility] in [place], [place]. Started dialysis in 2010. ESRD due to DM/HTN.  . Diabetes mellitus     Medications:  Scheduled:     . albuterol  2 puff Inhalation Q6H  . ciprofloxacin  400 mg Intravenous Q24H  . darbepoetin (ARANESP) injection - DIALYSIS  150 mcg Intravenous Q Sat-HD  . escitalopram  20 mg Oral Daily  . feeding supplement (NEPRO CARB STEADY)  1,000 mL Per Tube Q24H  . feeding supplement  30 mL Per Tube 6 X Daily  . heparin  5,000 Units Subcutaneous Q8H  . metronidazole  250 mg Intravenous Q8H  . midodrine  10 mg Oral TID WC  .  multivitamin  1 tablet Oral QHS  . pantoprazole (PROTONIX) IV  40 mg Intravenous Q24H  . sevelamer  1,600 mg Oral TID WC  . sodium chloride  3 mL Intravenous Q12H  . [DISCONTINUED] albuterol  6 puff Inhalation Q6H  . [DISCONTINUED] ciprofloxacin  400 mg Intravenous Q12H  . [DISCONTINUED] OxyCODONE  60 mg Oral Q8H  . [DISCONTINUED] vancomycin  1,500 mg Intravenous Q24H   Assessment: 47 yo male who presented to ED with impaired hearing and altered mental status. Patient found to have AST 2606 and ALT 1654. Pharmacy to manage vancomycin for possible urosepsis. Patient has already received vancomycin 1gm IV x 1. Patient is with ESRD on HD prior to admission. Vanc trough today =44, above goal. Patient was receiving in accordance with CVVHD dosing, now with IHD, explaining elevated level. Will adjust. Goal of Therapy:  Pre-HD vancomycin level 15-25 mcg/mL Post-HD vancomycin level 5-15 mcg/mL   Plan:  Hold vanc today, will likely result in level of 25 after 4 hours of HD. Will not need another dose until after next HD session if to be continued. F/u C&S.  Verlene Mayer, PharmD, BCPS Pager [phone] 06/08/2012,12:00 PM

## 2012-06-09 LAB — CBC
HCT: 27.7 % — ABNORMAL LOW (ref 39.0–52.0)
Hemoglobin: 8.5 g/dL — ABNORMAL LOW (ref 13.0–17.0)
MCH: 30.4 pg (ref 26.0–34.0)
MCHC: 30.7 g/dL (ref 30.0–36.0)
MCV: 98.9 fL (ref 78.0–100.0)
RBC: 2.8 MIL/uL — ABNORMAL LOW (ref 4.22–5.81)

## 2012-06-09 LAB — RENAL FUNCTION PANEL
BUN: 31 mg/dL — ABNORMAL HIGH (ref 6–23)
CO2: 21 mEq/L (ref 19–32)
Calcium: 9 mg/dL (ref 8.4–10.5)
Chloride: 97 mEq/L (ref 96–112)
Creatinine, Ser: 4.09 mg/dL — ABNORMAL HIGH (ref 0.50–1.35)

## 2012-06-09 LAB — GLUCOSE, CAPILLARY: Glucose-Capillary: 93 mg/dL (ref 70–99)

## 2012-06-09 LAB — CULTURE, RESPIRATORY W GRAM STAIN

## 2012-06-09 MED ORDER — LIDOCAINE-PRILOCAINE 2.5-2.5 % EX CREA: 1.0000 "application " | TOPICAL_CREAM | CUTANEOUS | Status: DC | PRN

## 2012-06-09 MED ORDER — CEFAZOLIN SODIUM 1-5 GM-% IV SOLN
1.0000 g | INTRAVENOUS | Status: DC
  Administered 2012-06-09 – 2012-06-11 (×3): 1 g via INTRAVENOUS
  Filled 2012-06-09 (×5): qty 50

## 2012-06-09 MED ORDER — PENTAFLUOROPROP-TETRAFLUOROETH EX AERO: 1.0000 "application " | INHALATION_SPRAY | CUTANEOUS | Status: DC | PRN

## 2012-06-09 MED ORDER — HEPARIN SODIUM (PORCINE) 1000 UNIT/ML DIALYSIS: 20.0000 [IU]/kg | INTRAMUSCULAR | Status: DC | PRN

## 2012-06-09 MED ORDER — VANCOMYCIN HCL IN DEXTROSE 1-5 GM/200ML-% IV SOLN
1000.0000 mg | Freq: Once | INTRAVENOUS | Status: AC
  Administered 2012-06-09: 1000 mg via INTRAVENOUS
  Filled 2012-06-09: qty 200

## 2012-06-09 MED ORDER — NEPRO/CARBSTEADY PO LIQD: 237.0000 mL | ORAL | Status: DC | PRN

## 2012-06-09 MED ORDER — DARBEPOETIN ALFA-POLYSORBATE 150 MCG/0.3ML IJ SOLN
INTRAMUSCULAR | Status: AC
  Administered 2012-06-09: 150 ug via INTRAVENOUS
  Filled 2012-06-09: qty 0.3

## 2012-06-09 MED ORDER — SODIUM CHLORIDE 0.9 % IV SOLN: 100.0000 mL | INTRAVENOUS | Status: DC | PRN

## 2012-06-09 MED ORDER — LIDOCAINE HCL (PF) 1 % IJ SOLN: 5.0000 mL | INTRAMUSCULAR | Status: DC | PRN

## 2012-06-09 MED ORDER — HEPARIN SODIUM (PORCINE) 1000 UNIT/ML DIALYSIS: 1000.0000 [IU] | INTRAMUSCULAR | Status: DC | PRN

## 2012-06-09 MED ORDER — ALPRAZOLAM 0.5 MG PO TABS
0.5000 mg | ORAL_TABLET | Freq: Two times a day (BID) | ORAL | Status: DC | PRN
  Administered 2012-06-09 – 2012-06-11 (×3): 0.5 mg via ORAL
  Filled 2012-06-09 (×3): qty 1

## 2012-06-09 MED ORDER — VANCOMYCIN HCL IN DEXTROSE 1-5 GM/200ML-% IV SOLN
1000.0000 mg | Freq: Once | INTRAVENOUS | Status: DC
  Filled 2012-06-09: qty 200

## 2012-06-09 MED ORDER — ALTEPLASE 2 MG IJ SOLR: 2.0000 mg | Freq: Once | INTRAMUSCULAR | Status: DC | PRN

## 2012-06-09 NOTE — Progress Notes (Addendum)
ANTIBIOTIC CONSULT NOTE - INITIAL  Pharmacy Consult for Ancef Indication: MSSA PNA  Allergies  Allergen Reactions  . Piperacillin Sod-Tazobactam So Hives    Patient Measurements: Height: 6' 0.05" (183 cm) Weight: 331 lb 9.2 oz (150.4 kg) IBW/kg (Calculated) : 77.71   Vital Signs: Temp: 97.5 F (36.4 C) (01/11 1126) Temp src: Oral (01/11 1126) BP: 140/71 mmHg (01/11 1100) Pulse Rate: 77  (01/11 1200) Intake/Output from previous day: 01/10 0701 - 01/11 0700 In: 950 [P.O.:120; I.V.:480; IV Piggyback:350] Out: 2435  Intake/Output from this shift: Total I/O In: 100 [I.V.:100] Out: -   Labs:  Basename 06/08/12 0725 06/08/12 0645 06/06/12 1626  WBC 13.1* -- --  HGB 8.5* -- --  PLT 138* -- --  LABCREA -- -- --  CREATININE -- 5.04* 4.06*   Estimated Creatinine Clearance: 28.3 ml/min (by C-G formula based on Cr of 5.04).  Basename 06/08/12 0715  VANCOTROUGH 44.4*  VANCOPEAK --  Drue Dun --  GENTTROUGH --  GENTPEAK --  GENTRANDOM --  TOBRATROUGH --  Nolen Mu --  TOBRARND --  AMIKACINPEAK --  AMIKACINTROU --  AMIKACIN --     Microbiology: Recent Results (from the past 720 hour(s))  CULTURE, BLOOD (ROUTINE X 2)     Status: Normal (Preliminary result)   Collection Time   06/04/12 11:58 PM      Component Value Range Status Comment   Specimen Description BLOOD RIGHT ARM   Final    Special Requests BOTTLES DRAWN AEROBIC AND ANAEROBIC 10CC EACH   Final    Culture  Setup Time 06/05/2012 04:09   Final    Culture     Final    Value:        BLOOD CULTURE RECEIVED NO GROWTH TO DATE CULTURE WILL BE HELD FOR 5 DAYS BEFORE ISSUING A FINAL NEGATIVE REPORT   Report Status PENDING   Incomplete   CULTURE, BLOOD (ROUTINE X 2)     Status: Normal (Preliminary result)   Collection Time   06/05/12 12:05 AM      Component Value Range Status Comment   Specimen Description BLOOD RIGHT HAND   Final    Special Requests BOTTLES DRAWN AEROBIC AND ANAEROBIC 10CC EACH   Final    Culture   Setup Time 06/05/2012 04:09   Final    Culture     Final    Value:        BLOOD CULTURE RECEIVED NO GROWTH TO DATE CULTURE WILL BE HELD FOR 5 DAYS BEFORE ISSUING A FINAL NEGATIVE REPORT   Report Status PENDING   Incomplete   MRSA PCR SCREENING     Status: Normal   Collection Time   06/05/12  6:53 AM      Component Value Range Status Comment   MRSA by PCR NEGATIVE  NEGATIVE Final   CULTURE, RESPIRATORY     Status: Normal   Collection Time   06/05/12  4:12 PM      Component Value Range Status Comment   Specimen Description TRACHEAL ASPIRATE   Final    Special Requests NONE   Final    Gram Stain     Final    Value: ABUNDANT WBC PRESENT, PREDOMINANTLY PMN     RARE SQUAMOUS EPITHELIAL CELLS PRESENT     FEW GRAM POSITIVE COCCI     IN PAIRS FEW GRAM POSITIVE RODS     FEW YEAST   Culture     Final    Value: ABUNDANT STAPHYLOCOCCUS AUREUS  Note: RIFAMPIN AND GENTAMICIN SHOULD NOT BE USED AS SINGLE DRUGS FOR TREATMENT OF STAPH INFECTIONS.   Report Status 06/09/2012 FINAL   Final    Organism ID, Bacteria STAPHYLOCOCCUS AUREUS   Final     Medical History: Past Medical History  Diagnosis Date  . Hypertension   . GERD (gastroesophageal reflux disease)   . DVT (deep venous thrombosis)   . Renal disorder   . Complication of anesthesia     " I am hard  to wake up "  . ESRD on hemodialysis     Dialysis at [facility] in [place], [place]. Started dialysis in 2010. ESRD due to DM/HTN.  . Diabetes mellitus     Medications:  Scheduled:     . albuterol  2 puff Inhalation Q6H  . darbepoetin (ARANESP) injection - DIALYSIS  150 mcg Intravenous Q Sat-HD  . escitalopram  20 mg Oral Daily  . feeding supplement (NEPRO CARB STEADY)  1,000 mL Per Tube Q24H  . feeding supplement  30 mL Per Tube 6 X Daily  . heparin  5,000 Units Subcutaneous Q8H  . midodrine  10 mg Oral TID WC  . multivitamin  1 tablet Oral QHS  . pantoprazole (PROTONIX) IV  40 mg Intravenous Q24H  . sevelamer  1,600 mg  Oral TID WC  . sodium chloride  3 mL Intravenous Q12H  . [DISCONTINUED] ciprofloxacin  400 mg Intravenous Q24H  . [DISCONTINUED] metronidazole  250 mg Intravenous Q8H  . [DISCONTINUED] vancomycin  1,000 mg Intravenous Once   Assessment: 47 yo male who presented to ED with impaired hearing and altered mental status. Patient is known to pharmacy from prior abx dosing. Pt is afebrile, WBC trending down, now 13.1, PCT 2.64. Pt has ESRD and is on intermittent HD TTS, noted plans for HD today.  vanc 1/7>>1/11 cipro 1/7>>1/11 Flagyl 1/7>>1/11 Ancef 1/11>>  1/6 blood cx>>ngtd 1/7 resp cx>>MSSA 1/7 urine cx>> (not done yet)  Plan:  - Ancef 1 gm IV q 24h, will start after HD today - Consider d/c ancef on 1/14 (completes 8-day course) - Will monitor cx/spec/sens, renal fn and clinical status daily.  Thanks, Trenell Moxey K. Allena Katz, PharmD, BCPS.  Clinical Pharmacist Pager [phone]. 06/09/2012 1:15 PM

## 2012-06-09 NOTE — Progress Notes (Signed)
[facility] ROUNDING NOTE   Subjective:   Interval History: extubated and feels improved  Objective:  Vital signs in last 24 hours:  Temp:  [97.5 F (36.4 C)-98.5 F (36.9 C)] 97.5 F (36.4 C) (01/11 1126) Pulse Rate:  [62-88] 77  (01/11 1100) Resp:  [13-22] 18  (01/11 1100) BP: (88-153)/(36-106) 140/71 mmHg (01/11 1100) SpO2:  [90 %-100 %] 94 % (01/11 1100) Weight:  [150.4 kg (331 lb 9.2 oz)] 150.4 kg (331 lb 9.2 oz) (01/11 0500)  Weight change: -0.9 kg (-1 lb 15.8 oz) Filed Weights   06/07/12 0500 06/08/12 0600 06/09/12 0500  Weight: 154 kg (339 lb 8.1 oz) 151.3 kg (333 lb 8.9 oz) 150.4 kg (331 lb 9.2 oz)    Intake/Output: I/O last 3 completed shifts: In: 1440 [P.O.:120; I.V.:670; IV Piggyback:650] Out: 2435 [Other:2435]   Intake/Output this shift:  Total I/O In: 80 [I.V.:80] Out: -   CVS- RRR RS- CTA right IJ ABD- BS present soft non-distended EXT- no edema   Basic Metabolic Panel:  Lab 06/08/12 5409 06/06/12 1626 06/06/12 0415 06/05/12 1648 06/05/12 0532  NA 135 136 137 139 138  K 3.9 3.7 3.9 3.9 4.0  CL 99 101 98 99 100  CO2 23 21 21 20 24   GLUCOSE 100* 124* 124* 150* 126*  BUN 50* 32* 36* 47* 48*  CREATININE 5.04* 4.06* 4.68* 6.03* 6.21*  CALCIUM 8.9 8.8 9.1 -- --  MG -- -- -- -- --  PHOS 4.7* 2.9 3.7 5.5* --    Liver Function Tests:  Lab 06/08/12 0645 06/06/12 1626 06/06/12 0415 06/05/12 1648 06/05/12 0532 06/04/12 2226  AST 61* -- 416* -- 1499* 2606*  ALT 501* -- 1154* -- 1435* 1654*  ALKPHOS 102 -- 122* -- 108 120*  BILITOT 0.3 -- 0.4 -- 0.3 0.3  PROT 6.6 -- 7.3 -- 6.8 7.7  ALBUMIN 2.4* 2.4* 2.8* 2.7* 2.7* --    Lab 06/05/12 0033  LIPASE 76*  AMYLASE --    Lab 06/05/12 0033  AMMONIA 68*    CBC:  Lab 06/08/12 0725 06/06/12 0415 06/05/12 0532 06/05/12 0033 06/04/12 2226  WBC 13.1* 17.6* 17.7* 22.1* 23.1*  NEUTROABS -- -- -- 20.1* 21.3*  HGB 8.5* 9.7* 9.5* 10.3* 10.6*  HCT 27.9* 31.0* 30.9* 33.5* 34.5*  MCV 98.6  97.5 100.0 99.7 100.0  PLT 138* 185 203 237 223    Cardiac Enzymes:  Lab 06/05/12 2124 06/05/12 1648 06/05/12 0830  CKTOTAL -- -- --  CKMB -- -- --  CKMBINDEX -- -- --  TROPONINI 0.91* 1.21* 1.44*    BNP: No components found with this basename: POCBNP:5  CBG:  Lab 06/09/12 0720 06/09/12 0606 06/08/12 2212 06/08/12 1520 06/08/12 1125  GLUCAP 85 93 78 93 88    Microbiology: Results for orders placed during the hospital encounter of 06/04/12  CULTURE, BLOOD (ROUTINE X 2)     Status: Normal (Preliminary result)   Collection Time   06/04/12 11:58 PM      Component Value Range Status Comment   Specimen Description BLOOD RIGHT ARM   Final    Special Requests BOTTLES DRAWN AEROBIC AND ANAEROBIC 10CC EACH   Final    Culture  Setup Time 06/05/2012 04:09   Final    Culture     Final    Value:        BLOOD CULTURE RECEIVED NO GROWTH TO DATE CULTURE WILL BE HELD FOR 5 DAYS BEFORE ISSUING A FINAL NEGATIVE REPORT   Report  Status PENDING   Incomplete   CULTURE, BLOOD (ROUTINE X 2)     Status: Normal (Preliminary result)   Collection Time   06/05/12 12:05 AM      Component Value Range Status Comment   Specimen Description BLOOD RIGHT HAND   Final    Special Requests BOTTLES DRAWN AEROBIC AND ANAEROBIC 10CC EACH   Final    Culture  Setup Time 06/05/2012 04:09   Final    Culture     Final    Value:        BLOOD CULTURE RECEIVED NO GROWTH TO DATE CULTURE WILL BE HELD FOR 5 DAYS BEFORE ISSUING A FINAL NEGATIVE REPORT   Report Status PENDING   Incomplete   MRSA PCR SCREENING     Status: Normal   Collection Time   06/05/12  6:53 AM      Component Value Range Status Comment   MRSA by PCR NEGATIVE  NEGATIVE Final   CULTURE, RESPIRATORY     Status: Normal   Collection Time   06/05/12  4:12 PM      Component Value Range Status Comment   Specimen Description TRACHEAL ASPIRATE   Final    Special Requests NONE   Final    Gram Stain     Final    Value: ABUNDANT WBC PRESENT, PREDOMINANTLY PMN      RARE SQUAMOUS EPITHELIAL CELLS PRESENT     FEW GRAM POSITIVE COCCI     IN PAIRS FEW GRAM POSITIVE RODS     FEW YEAST   Culture     Final    Value: ABUNDANT STAPHYLOCOCCUS AUREUS     Note: RIFAMPIN AND GENTAMICIN SHOULD NOT BE USED AS SINGLE DRUGS FOR TREATMENT OF STAPH INFECTIONS.   Report Status 06/09/2012 FINAL   Final    Organism ID, Bacteria STAPHYLOCOCCUS AUREUS   Final     Coagulation Studies: No results found for this basename: LABPROT:5,INR:5 in the last 72 hours  Urinalysis: No results found for this basename: COLORURINE:2,APPERANCEUR:2,LABSPEC:2,PHURINE:2,GLUCOSEU:2,HGBUR:2,BILIRUBINUR:2,KETONESUR:2,PROTEINUR:2,UROBILINOGEN:2,NITRITE:2,LEUKOCYTESUR:2 in the last 72 hours    Imaging: No results found.   Medications:      . sodium chloride 20 mL/hr at 06/09/12 1100      . albuterol  2 puff Inhalation Q6H  . ciprofloxacin  400 mg Intravenous Q24H  . darbepoetin (ARANESP) injection - DIALYSIS  150 mcg Intravenous Q Sat-HD  . escitalopram  20 mg Oral Daily  . feeding supplement (NEPRO CARB STEADY)  1,000 mL Per Tube Q24H  . feeding supplement  30 mL Per Tube 6 X Daily  . heparin  5,000 Units Subcutaneous Q8H  . metronidazole  250 mg Intravenous Q8H  . midodrine  10 mg Oral TID WC  . multivitamin  1 tablet Oral QHS  . pantoprazole (PROTONIX) IV  40 mg Intravenous Q24H  . sevelamer  1,600 mg Oral TID WC  . sodium chloride  3 mL Intravenous Q12H  . vancomycin  1,000 mg Intravenous Once   sodium chloride, sodium chloride, albuterol, feeding supplement (NEPRO CARB STEADY), feeding supplement (NEPRO CARB STEADY), fentaNYL, heparin, heparin, lidocaine, lidocaine-prilocaine, midazolam, ondansetron, pentafluoroprop-tetrafluoroeth, sevelamer  Assessment/ Plan:  ESRD TTS  Shock improved off pressors  Metabolic acidosis and electrolytes stable  Anemia hb dropped to 8's will give darbepoietin Plan HD today   LOS: 5 [name] W @TODAY @11 :59 AM

## 2012-06-09 NOTE — Progress Notes (Signed)
The patient complaining of anxiety;   On home Xanax;   Reinitiate at 0.5 mg twice daily

## 2012-06-09 NOTE — Progress Notes (Signed)
RT Note: Spoke with pt about CPAP order. Pt stated he will get on for home but does not want one of our units tonight. Pt also stated hes going home tonight. I told family at bedside if he changes his mind and will try our machine to let us know and we can get him setup. RT will continue to monitor

## 2012-06-09 NOTE — Progress Notes (Signed)
PULMONARY  / CRITICAL CARE MEDICINE  Name: [name] MRN: [id number] DOB: [birth year]    LOS: 5 days Date of admit: 06/04/2012  REFERRING MD :  [facility]  CHIEF COMPLAINT:  Altered mental status & deafness   HISTORY OF PRESENT ILLNESS:  23 /M morbidly obese, ESRD - on HD T/T/S BIBEMS for chief c/o acute onset decreased hearing and tinnitus.As per his wife he has been afebrile, not complaining of pain, not coughing. Labs showed leucocytosis & elevated liver enzymes with nml bili & INR. Of note nml LFTs in 11/13 with nml hep profile. Of note, patient in resp acidosis at admit and took more opiates than usual   Low BP recorded intermittently ? Cuff issues , 1.5 L fluid given, empiric abx & admitted to ICU for potential CVVH. Of note, head CT neg & US abdomen - Status post cholecystectomy and right nephrectomy.  Large fluid collection in the right renal fossa correlates with liquefaction of hematoma seen on prior CT scan.   PMH - adm 10/13 for retroperitoneal hematoma, has IVC filter with DVT  LEVEL OF CARE:  ICU PRIMARY SERVICE:  PCCM CONSULTANTS:  renal CODE STATUSfull DIET:  npo DVT Px:  Sq heaprin, IVC filter GI Px:  protonix     LINES / TUBES: ETT 06/05/12 >>06/07/12 Rt IJ HD trialysis cath 06/05/12>>  CULTURES: Blood 1/7 >>  Results for orders placed during the hospital encounter of 06/04/12  CULTURE, BLOOD (ROUTINE X 2)     Status: Normal (Preliminary result)   Collection Time   06/04/12 11:58 PM      Component Value Range Status Comment   Specimen Description BLOOD RIGHT ARM   Final    Special Requests BOTTLES DRAWN AEROBIC AND ANAEROBIC 10CC EACH   Final    Culture  Setup Time 06/05/2012 04:09   Final    Culture     Final    Value:        BLOOD CULTURE RECEIVED NO GROWTH TO DATE CULTURE WILL BE HELD FOR 5 DAYS BEFORE ISSUING A FINAL NEGATIVE REPORT   Report Status PENDING   Incomplete   CULTURE, BLOOD (ROUTINE X 2)     Status: Normal (Preliminary result)   Collection  Time   06/05/12 12:05 AM      Component Value Range Status Comment   Specimen Description BLOOD RIGHT HAND   Final    Special Requests BOTTLES DRAWN AEROBIC AND ANAEROBIC 10CC EACH   Final    Culture  Setup Time 06/05/2012 04:09   Final    Culture     Final    Value:        BLOOD CULTURE RECEIVED NO GROWTH TO DATE CULTURE WILL BE HELD FOR 5 DAYS BEFORE ISSUING A FINAL NEGATIVE REPORT   Report Status PENDING   Incomplete   MRSA PCR SCREENING     Status: Normal   Collection Time   06/05/12  6:53 AM      Component Value Range Status Comment   MRSA by PCR NEGATIVE  NEGATIVE Final   CULTURE, RESPIRATORY     Status: Normal   Collection Time   06/05/12  4:12 PM      Component Value Range Status Comment   Specimen Description TRACHEAL ASPIRATE   Final    Special Requests NONE   Final    Gram Stain     Final    Value: ABUNDANT WBC PRESENT, PREDOMINANTLY PMN     RARE SQUAMOUS EPITHELIAL CELLS PRESENT  FEW GRAM POSITIVE COCCI     IN PAIRS FEW GRAM POSITIVE RODS     FEW YEAST   Culture     Final    Value: ABUNDANT STAPHYLOCOCCUS AUREUS     Note: RIFAMPIN AND GENTAMICIN SHOULD NOT BE USED AS SINGLE DRUGS FOR TREATMENT OF STAPH INFECTIONS.   Report Status 06/09/2012 FINAL   Final    Organism ID, Bacteria STAPHYLOCOCCUS AUREUS   Final      ANTIBIOTICS: cipro 1/7 >>1//11 vanc 1/7 >>1/11 Flagyl 1/7 >>1/11 Ancef 1/11 >>  Anti-infectives     Start     Dose/Rate Route Frequency Ordered Stop   06/09/12 1200   vancomycin (VANCOCIN) IVPB 1000 mg/200 mL premix        1,000 mg 200 mL/hr over 60 Minutes Intravenous  Once 06/09/12 1126     06/09/12 0500   ciprofloxacin (CIPRO) IVPB 400 mg        400 mg 200 mL/hr over 60 Minutes Intravenous Every 24 hours 06/08/12 1038     06/06/12 0800   vancomycin (VANCOCIN) 1,500 mg in sodium chloride 0.9 % 250 mL IVPB  Status:  Discontinued        1,500 mg 125 mL/hr over 120 Minutes Intravenous Every 24 hours 06/05/12 0901 06/08/12 0856   06/06/12 0600    ciprofloxacin (CIPRO) IVPB 400 mg  Status:  Discontinued        400 mg 200 mL/hr over 60 Minutes Intravenous Every 24 hours 06/05/12 0638 06/05/12 0900   06/06/12 0000   ciprofloxacin (CIPRO) IVPB 400 mg  Status:  Discontinued        400 mg 200 mL/hr over 60 Minutes Intravenous Every 24 hours 06/05/12 0532 06/05/12 0638   06/05/12 1700   ciprofloxacin (CIPRO) IVPB 400 mg  Status:  Discontinued        400 mg 200 mL/hr over 60 Minutes Intravenous Every 12 hours 06/05/12 0901 06/08/12 1038   06/05/12 1400   metroNIDAZOLE (FLAGYL) IVPB 250 mg        250 mg 50 mL/hr over 60 Minutes Intravenous Every 8 hours 06/05/12 0532     06/05/12 0730   vancomycin (VANCOCIN) 1,500 mg in sodium chloride 0.9 % 500 mL IVPB        1,500 mg 250 mL/hr over 120 Minutes Intravenous  Once 06/05/12 0639 06/05/12 1041   06/05/12 0315   vancomycin (VANCOCIN) IVPB 1000 mg/200 mL premix        1,000 mg 200 mL/hr over 60 Minutes Intravenous  Once 06/05/12 0311 06/05/12 0501   06/05/12 0315   ciprofloxacin (CIPRO) IVPB 400 mg        400 mg 200 mL/hr over 60 Minutes Intravenous  Once 06/05/12 0311 06/05/12 0607   06/05/12 0315   metroNIDAZOLE (FLAGYL) IVPB 500 mg        500 mg 100 mL/hr over 60 Minutes Intravenous  Once 06/05/12 0311 06/05/12 0711           SIGNIFICANT EVENTS:  1/7 low BP- tr to 2300. Intubated due to resp acidosis. On CVVH. On pressors 06/06/12: Off pressors. Hearing is regained equally and bialterally. Normal WUA. Doing well on SBt and looked good for extubation but showed resp acidosis 06/07/12 - extubated 06/08/12: Got 60mg  oxycontin yesterday morning -> held last night due to drowsiness, given 60mg  again this morning -> drowsy  Again (d/w sister - baseline dose is the short acting oxycodone at half of what he got with [name] at 30mg  prn  which he normally needs bid). Sister  DPOA says he behaves likes this when he gets lot of opiates   SUBJECTIVE/OVERNIGHT/INTERVAL HX 06/09/2012: doing well.  Confusion largely resolved. Going for HD to HD floor. Wheeled around ICU in his wheel chair    VITAL SIGNS: Temp:  [97.5 F (36.4 C)-98.5 F (36.9 C)] 97.5 F (36.4 C) (01/11 1126) Pulse Rate:  [62-88] 77  (01/11 1200) Resp:  [13-22] 18  (01/11 1200) BP: (88-153)/(36-106) 140/71 mmHg (01/11 1100) SpO2:  [90 %-100 %] 92 % (01/11 1200) Weight:  [150.4 kg (331 lb 9.2 oz)] 150.4 kg (331 lb 9.2 oz) (01/11 0500) HEMODYNAMICS: CVP:  [13 mmHg] 13 mmHg VENTILATOR SETTINGS:   INTAKE / OUTPUT: Intake/Output      01/10 0701 - 01/11 0700 01/11 0701 - 01/12 0700   P.O. 120    I.V. (mL/kg) 480 (3.2) 100 (0.7)   NG/GT     IV Piggyback 350    Total Intake(mL/kg) 950 (6.3) 100 (0.7)   Other 2435    Total Output 2435    Net -1485 +100          PHYSICAL EXAMINATION: Gen. obese, in no distress, normal affect ENT - no lesions, no post nasal drip. NORMAL HEARING. TRACH SCAR + Neck: No JVD, no thyromegaly, no carotid bruits Lungs: no use of accessory muscles, no dullness to percussion, clear without rales or rhonchi  Cardiovascular: Rhythm regular, heart sounds  normal, no murmurs, no peripheral edema, no rub Abdomen: soft and non-tender, no hepatosplenomegaly, BS normal., scars midline & of GB drains Musculoskeletal: No deformities, no cyanosis or clubbing, LUE AVF- good thrill Neuro:  ASTERIXIS RESOLVED. RASS +1. CAM-ICU appears negative though sister thinks still mildly confused Skin:  Warm, no lesions/ rash   LABS: Cbc  Lab 06/08/12 0725 06/06/12 0415 06/05/12 0532  WBC 13.1* -- --  HGB 8.5* 9.7* 9.5*  HCT 27.9* 31.0* 30.9*  PLT 138* 185 203    Chemistry   Lab 06/08/12 0645 06/06/12 1626 06/06/12 0415  NA 135 136 137  K 3.9 3.7 3.9  CL 99 101 98  CO2 23 21 21   BUN 50* 32* 36*  CREATININE 5.04* 4.06* 4.68*  CALCIUM 8.9 8.8 9.1  MG -- -- --  PHOS 4.7* 2.9 3.7  GLUCOSE 100* 124* 124*    Liver fxn  Lab 06/08/12 0645 06/06/12 1626 06/06/12 0415 06/05/12 0532  AST 61*  -- 416* 1499*  ALT 501* -- 1154* 1435*  ALKPHOS 102 -- 122* 108  BILITOT 0.3 -- 0.4 0.3  PROT 6.6 -- 7.3 6.8  ALBUMIN 2.4* 2.4* 2.8* --   coags  Lab 06/05/12 0033  APTT --  INR 1.29   Sepsis markers  Lab 06/08/12 0645 06/07/12 0356 06/06/12 1115 06/05/12 0309  LATICACIDVEN -- -- -- 1.6  PROCALCITON 2.64 3.62 3.10 --   Cardiac markers  Lab 06/05/12 2124 06/05/12 1648 06/05/12 0830  CKTOTAL -- -- --  CKMB -- -- --  TROPONINI 0.91* 1.21* 1.44*   BNP No results found for this basename: PROBNP:3 in the last 168 hours ABG  Lab 06/08/12 0903 06/07/12 0959 06/07/12 0837  PHART 7.272* 7.294* 7.319*  PCO2ART 56.0* 51.0* 51.7*  PO2ART 65.9* 62.0* 78.0*  HCO3 25.0* 24.8* 26.6*  TCO2 26.8 26 28     CBG trend  Lab 06/09/12 0720 06/09/12 0606 06/08/12 2212 06/08/12 1520 06/08/12 1125  GLUCAP 85 93 78 93 88    No results found.   DIAGNOSES: Principal Problem:  *  Transaminitis Active Problems:  ESRD (end stage renal disease) on dialysis  Hearing loss  Septic shock(785.52)  Acute respiratory failure   ASSESSMENT / PLAN: Urosepsis, elevated liver enzymes ? Shock liver & acute onset deafness? Due to hypotension Hypercarbic resp failure  PULMONARY  ASSESSMENT:w/f edema/ fluid overload OSA Hypercarbic resp failure - could not extubate 06/07/11 due to resp acidosis during weaning. S/p extubation 06/08/11  On 06/09/2012: Maintaing airway  And enceph appears resolving/resolved  PLAN:  o2 Auto copap qhs empiric for OSA/OHV  (he is not on this at home)   CARDIOVASCULAR No results found for this basename: PROBNP:5 in the last 168 hours    Lab 06/05/12 2124 06/05/12 1648 06/05/12 0830  TROPONINI 0.91* 1.21* 1.44*     ASSESSMENT: hypotension needing midodrine and pressors. Ass mild trop leak at admi but echo 06/07/11 is normal On 06/09/2012: Off pressors since 06/06/12 pm. ECHO 06/07/11: Normal  PLAN:  Midodrine per renal   RENAL  ASSESSMENT:  ESRD On 06/09/2012: s/p  CVVH 06/05/12 to 06/06/12, Undergoing HD on 06/09/11 and again 06/09/12  PLAN:   Per renal  GASTROINTESTINAL  Lab 06/08/12 0645 06/06/12 1626 06/06/12 0415 06/05/12 1648 06/05/12 0532 06/05/12 0033 06/04/12 2226  AST 61* -- 416* -- 1499* -- 2606*  ALT 501* -- 1154* -- 1435* -- 1654*  ALKPHOS 102 -- 122* -- 108 -- 120*  BILITOT 0.3 -- 0.4 -- 0.3 -- 0.3  PROT 6.6 -- 7.3 -- 6.8 -- 7.7  ALBUMIN 2.4* 2.4* 2.8* 2.7* 2.7* -- --  INR -- -- -- -- -- 1.29 --      ASSESSMENT:  Elevated liver enzymes ? Medication induced, hep profile neg 11/13, neg tylenol - on 06/09/2012: Improving LFT   PLAN:   Supportive care   HEMATOLOGIC\  Lab 06/08/12 0725 06/06/12 0415 06/05/12 0532  HGB 8.5* 9.7* 9.5*  HCT 27.9* 31.0* 30.9*  WBC 13.1* 17.6* 17.7*  PLT 138* 185 203    Lab 06/05/12 0033  INR 1.29     ASSESSMENT:  DVT 10/13 s/p IVC filter, h/o retroperitoneal bleed PLAN:  Sq heaprin only - PRBC for hgb </= 6.9gm%    - exceptions are   -  if ACS susepcted/confirmed then transfuse for hgb </= 8.0gm%,  or    -  If septic shock first 24h and scvo2 < 70% then transfuse for hgb </= 9.0gm%   - active bleeding with hemodynamic instability, then transfuse regardless of hemoglobin value   At at all times try to transfuse 1 unit prbc as possible with exception of active hemorrhage    INFECTIOUS  Lab 06/08/12 0645 06/07/12 0356 06/06/12 1115  PROCALCITON 2.64 3.62 3.10     ASSESSMENT:  Diagnosed 06/09/12 as MSSA Pneumonia based on trach culture 06/05/12 and admit MRSA PCR negative  PLAN:   Change all abx to Ancef on 06/09/12; stop date per [facility]  ENDOCRINE  ASSESSMENT:  npo   PLAN:   CBG checks Diabetic diet  NEUROLOGIC  ASSESSMENT:  - Baselione chronic back pain on oxycodone 30mg  bid prn. Dr Andris Baumann [phone] -  Deafness, acute onset at admit 06/04/2012. REsolved 06/06/12. Nigel Bridgeman was due to hypotension - On 06/08/12 got encephalopathich afte receiving long acting opioid at double of short  acting home dose  On 06/09/2012: resolved enceophalopaty. Minimal pain  PLAN:   Monitor Avoid [place] opioids Use fent prn inpatient DC on oxycodone low dose prn Needs opd pain mgmt re-assessment (sister describes that xanax 'knocks him out')  GLOBAL  -  movet of 6700 unit. [facility] to assumer service 06/10/12. PCCM wil sign off. D/w J Mclung     Dr. Kalman Shan, M.D., F.C.C.P Pulmonary and Critical Care Medicine Staff Physician [facility] [place] [facility] Pager: [phone], If no answer or between  15:00h - 7:00h: call [phone]  06/09/2012 12:43 PM

## 2012-06-09 NOTE — Progress Notes (Signed)
Pt transferred to unit from 2900. Pt is alert and oriented to self and situation. slightly disoriented to time and place as per current baseline per report from previous nurse. Pt oriented to room staff and call bell.

## 2012-06-09 NOTE — Progress Notes (Signed)
Report given to Consuella Lose, RN in hemodialysis in preparation for this morning's dialysis treatment.

## 2012-06-09 NOTE — Progress Notes (Signed)
The patient can go for HD treatment to the HD unit.

## 2012-06-10 DIAGNOSIS — D72829 Elevated white blood cell count, unspecified: Secondary | ICD-10-CM

## 2012-06-10 MED ORDER — INSULIN ASPART 100 UNIT/ML ~~LOC~~ SOLN: 0.0000 [IU] | Freq: Three times a day (TID) | SUBCUTANEOUS | Status: DC

## 2012-06-10 MED ORDER — CIPROFLOXACIN HCL 0.3 % OP SOLN
1.0000 [drp] | OPHTHALMIC | Status: DC
  Administered 2012-06-10 – 2012-06-12 (×12): 1 [drp] via OPHTHALMIC
  Filled 2012-06-10: qty 2.5

## 2012-06-10 MED ORDER — TRAMADOL HCL 50 MG PO TABS
50.0000 mg | ORAL_TABLET | Freq: Three times a day (TID) | ORAL | Status: DC | PRN
  Administered 2012-06-10 – 2012-06-11 (×2): 50 mg via ORAL
  Filled 2012-06-10 (×2): qty 1

## 2012-06-10 NOTE — Progress Notes (Signed)
[facility] PROGRESS NOTE  [name] WUJ:[id number] DOB: 23-Feb-1966 DOA: 06/04/2012 PCP: Pcp Not In System  Assessment/Plan:  HISTORY OF PRESENT ILLNESS: 20 /M morbidly obese, ESRD - on HD T/T/S BIBEMS for chief c/o acute onset decreased hearing and tinnitus.As per his wife he has been afebrile, not complaining of pain, not coughing. Labs showed leucocytosis & elevated liver enzymes with nml bili & INR. Of note nml LFTs in 11/13 with nml hep profile. Of note, patient in resp acidosis at admit and took more opiates than usual.  Admitted to ICU for potential CVVH.  Of note, head CT neg & US abdomen - Status post cholecystectomy and right nephrectomy. Large fluid collection in the right renal fossa correlates with liquefaction of hematoma seen on prior CT scan.  PMH - adm 10/13 for retroperitoneal hematoma, has IVC filter with DVT  1-Hypercarbic resp failure : S/p extubation 06/08/11, in setting of respiratory acidosis, encephalopathy, opioid use.  2-Septic Shock: Probably secondary to PNA. Patient now off pressors. Cont with midodrine.   3-MSSA PNA: Based on trach culture 06/05/12 and admit MRSA PCR negative. Patient received 5 days of vancomycin and ciprofloxacin. Started on Ancef 01-11. Day 6 antibiotics. WBC stable at 13.   4-ESRD: T/Th/S: Continue with dialysis. Initially he was on CVVH.  5-Transaminases: Shock liver: hep profile neg 11/13, neg tylenol.  Improving LFT. Rep[eat LFT in am.  6-DVT 10/13 s/p IVC filter, h/o retroperitoneal bleed: On heparin per CCM.  7-Chronic Pain Syndrome:  chronic back pain on oxycodone 30mg  bid prn. Dr Andris Baumann [phone].  On 06/08/12 got encephalopathich after receiving long acting opioid. Continue with low dose IV fentanyl. Will add low dose tramadol. Will monitor for sedation.  8-Encephalopathy: Multifactorial. Secondary to opioid, infection. Improved.  9-Anemia of chronic diseases and acute illness. Started on ARANESP. 10.Diabetes ? : Check Hb-A1c.  SSI.  Code Status: Full. Family Communication: Care Discussed with Patient.  Disposition Plan: PT, OT consult. To be determine.   SIGNIFICANT EVENTS:  1/7 low BP- tr to 2300. Intubated due to resp acidosis. On CVVH. On pressors  06/06/12: Off pressors. Hearing is regained equally and bialterally. Normal WUA. Doing well on SBt and looked good for extubation but showed resp acidosis  06/07/12 - extubated  06/08/12: Got 60mg  oxycontin yesterday morning -> held last night due to drowsiness, given 60mg  again this morning -> drowsy Again (d/w sister - baseline dose is the short acting oxycodone at half of what he got with [name] at 30mg  prn which he normally needs bid). Sister DPOA says he behaves likes this when he gets lot of opiates   Consultants:  Dr. Hyman Hopes, renal  CCM    Procedures: ETT 06/05/12 >>06/07/12  Rt IJ HD trialysis cath 06/05/12>>   Antibiotics: cipro 1/7 >>1//11  vanc 1/7 >>1/11  Flagyl 1/7 >>1/11  Ancef 1/11 >>   HPI/Subjective: Wants his pain medications.  Wants to go home. No complaints.   Objective: Filed Vitals:   06/09/12 2103 06/10/12 0650 06/10/12 0818 06/10/12 1043  BP: 106/52 118/56  119/61  Pulse: 69 77  65  Temp: 98.8 F (37.1 C) 97.8 F (36.6 C)  98.5 F (36.9 C)  TempSrc: Oral Oral  Oral  Resp: 20 20  18   Height: 6' 0.05" (1.83 m)     Weight: 141.6 kg (312 lb 2.7 oz)     SpO2: 95% 93% 94% 94%    Intake/Output Summary (Last 24 hours) at 06/10/12 1242 Last data filed at 06/10/12 [phone]  Gross per 24 hour  Intake    170 ml  Output   1333 ml  Net  -1163 ml   Filed Weights   06/09/12 1640 06/09/12 1759 06/09/12 2103  Weight: 143 kg (315 lb 4.1 oz) 142 kg (313 lb 0.9 oz) 141.6 kg (312 lb 2.7 oz)    Exam:   General:  No distress.  Cardiovascular: S 1, S 2 RRR  Respiratory: CTA  Abdomen: BS present, soft.  Extremities; No edema.   Data Reviewed: Basic Metabolic Panel:  Lab 06/09/12 4782 06/08/12 0645 06/06/12 1626 06/06/12 0415 06/05/12  1648  NA 135 135 136 137 139  K 3.9 3.9 3.7 3.9 3.9  CL 97 99 101 98 99  CO2 21 23 21 21 20   GLUCOSE 111* 100* 124* 124* 150*  BUN 31* 50* 32* 36* 47*  CREATININE 4.09* 5.04* 4.06* 4.68* 6.03*  CALCIUM 9.0 8.9 8.8 9.1 8.5  MG -- -- -- -- --  PHOS 3.3 4.7* 2.9 3.7 5.5*   Liver Function Tests:  Lab 06/09/12 1339 06/08/12 0645 06/06/12 1626 06/06/12 0415 06/05/12 1648 06/05/12 0532 06/04/12 2226  AST -- 61* -- 416* -- 1499* 2606*  ALT -- 501* -- 1154* -- 1435* 1654*  ALKPHOS -- 102 -- 122* -- 108 120*  BILITOT -- 0.3 -- 0.4 -- 0.3 0.3  PROT -- 6.6 -- 7.3 -- 6.8 7.7  ALBUMIN 2.4* 2.4* 2.4* 2.8* 2.7* -- --    Lab 06/05/12 0033  LIPASE 76*  AMYLASE --    Lab 06/05/12 0033  AMMONIA 68*   CBC:  Lab 06/09/12 1339 06/08/12 0725 06/06/12 0415 06/05/12 0532 06/05/12 0033 06/04/12 2226  WBC 13.3* 13.1* 17.6* 17.7* 22.1* --  NEUTROABS -- -- -- -- 20.1* 21.3*  HGB 8.5* 8.5* 9.7* 9.5* 10.3* --  HCT 27.7* 27.9* 31.0* 30.9* 33.5* --  MCV 98.9 98.6 97.5 100.0 99.7 --  PLT 157 138* 185 203 237 --   Cardiac Enzymes:  Lab 06/05/12 2124 06/05/12 1648 06/05/12 0830  CKTOTAL -- -- --  CKMB -- -- --  CKMBINDEX -- -- --  TROPONINI 0.91* 1.21* 1.44*   BNP (last 3 results) No results found for this basename: PROBNP:3 in the last 8760 hours CBG:  Lab 06/10/12 0746 06/10/12 0113 06/09/12 2100 06/09/12 1735 06/09/12 0720  GLUCAP 99 103* 131* 119* 85    Recent Results (from the past 240 hour(s))  CULTURE, BLOOD (ROUTINE X 2)     Status: Normal (Preliminary result)   Collection Time   06/04/12 11:58 PM      Component Value Range Status Comment   Specimen Description BLOOD RIGHT ARM   Final    Special Requests BOTTLES DRAWN AEROBIC AND ANAEROBIC 10CC EACH   Final    Culture  Setup Time 06/05/2012 04:09   Final    Culture     Final    Value:        BLOOD CULTURE RECEIVED NO GROWTH TO DATE CULTURE WILL BE HELD FOR 5 DAYS BEFORE ISSUING A FINAL NEGATIVE REPORT   Report Status PENDING    Incomplete   CULTURE, BLOOD (ROUTINE X 2)     Status: Normal (Preliminary result)   Collection Time   06/05/12 12:05 AM      Component Value Range Status Comment   Specimen Description BLOOD RIGHT HAND   Final    Special Requests BOTTLES DRAWN AEROBIC AND ANAEROBIC 10CC EACH   Final    Culture  Setup Time 06/05/2012  04:09   Final    Culture     Final    Value:        BLOOD CULTURE RECEIVED NO GROWTH TO DATE CULTURE WILL BE HELD FOR 5 DAYS BEFORE ISSUING A FINAL NEGATIVE REPORT   Report Status PENDING   Incomplete   MRSA PCR SCREENING     Status: Normal   Collection Time   06/05/12  6:53 AM      Component Value Range Status Comment   MRSA by PCR NEGATIVE  NEGATIVE Final   CULTURE, RESPIRATORY     Status: Normal   Collection Time   06/05/12  4:12 PM      Component Value Range Status Comment   Specimen Description TRACHEAL ASPIRATE   Final    Special Requests NONE   Final    Gram Stain     Final    Value: ABUNDANT WBC PRESENT, PREDOMINANTLY PMN     RARE SQUAMOUS EPITHELIAL CELLS PRESENT     FEW GRAM POSITIVE COCCI     IN PAIRS FEW GRAM POSITIVE RODS     FEW YEAST   Culture     Final    Value: ABUNDANT STAPHYLOCOCCUS AUREUS     Note: RIFAMPIN AND GENTAMICIN SHOULD NOT BE USED AS SINGLE DRUGS FOR TREATMENT OF STAPH INFECTIONS.   Report Status 06/09/2012 FINAL   Final    Organism ID, Bacteria STAPHYLOCOCCUS AUREUS   Final      Studies: No results found.  Scheduled Meds:   . albuterol  2 puff Inhalation Q6H  .  ceFAZolin (ANCEF) IV  1 g Intravenous Q24H  . darbepoetin (ARANESP) injection - DIALYSIS  150 mcg Intravenous Q Sat-HD  . escitalopram  20 mg Oral Daily  . feeding supplement (NEPRO CARB STEADY)  1,000 mL Per Tube Q24H  . feeding supplement  30 mL Per Tube 6 X Daily  . heparin  5,000 Units Subcutaneous Q8H  . midodrine  10 mg Oral TID WC  . multivitamin  1 tablet Oral QHS  . pantoprazole (PROTONIX) IV  40 mg Intravenous Q24H  . sevelamer  1,600 mg Oral TID WC  . sodium  chloride  3 mL Intravenous Q12H   Continuous Infusions:   Principal Problem:  *Transaminitis Active Problems:  ESRD (end stage renal disease) on dialysis  Hearing loss  Septic shock(785.52)  Acute respiratory failure    Time spent: 35 minutes.     [name]  [facility] Pager [phone]. If 8PM-8AM, please contact night-coverage at [email or web address], password [facility] 06/10/2012, 12:42 PM  LOS: 6 days

## 2012-06-10 NOTE — Progress Notes (Signed)
[facility] ROUNDING NOTE   Subjective:   Interval History: no complaints  Objective:  Vital signs in last 24 hours:  Temp:  [97.5 F (36.4 C)-98.8 F (37.1 C)] 98.5 F (36.9 C) (01/12 1043) Pulse Rate:  [64-84] 65  (01/12 1043) Resp:  [13-20] 18  (01/12 1043) BP: (94-129)/(47-96) 119/61 mmHg (01/12 1043) SpO2:  [90 %-97 %] 94 % (01/12 1043) Weight:  [141.6 kg (312 lb 2.7 oz)-144.7 kg (319 lb 0.1 oz)] 141.6 kg (312 lb 2.7 oz) (01/11 2103)  Weight change: -5.7 kg (-12 lb 9.1 oz) Filed Weights   06/09/12 1640 06/09/12 1759 06/09/12 2103  Weight: 143 kg (315 lb 4.1 oz) 142 kg (313 lb 0.9 oz) 141.6 kg (312 lb 2.7 oz)    Intake/Output: I/O last 3 completed shifts: In: 810 [P.O.:120; I.V.:340; IV Piggyback:350] Out: 1333 [Other:1333]   Intake/Output this shift:  Total I/O In: 120 [P.O.:120] Out: -   CVS- RRR RS- CTA right IJ ABD- BS present soft non-distended EXT- no edema   Basic Metabolic Panel:  Lab 06/09/12 1610 06/08/12 0645 06/06/12 1626 06/06/12 0415 06/05/12 1648  NA 135 135 136 137 139  K 3.9 3.9 3.7 3.9 3.9  CL 97 99 101 98 99  CO2 21 23 21 21 20   GLUCOSE 111* 100* 124* 124* 150*  BUN 31* 50* 32* 36* 47*  CREATININE 4.09* 5.04* 4.06* 4.68* 6.03*  CALCIUM 9.0 8.9 8.8 -- --  MG -- -- -- -- --  PHOS 3.3 4.7* 2.9 3.7 5.5*    Liver Function Tests:  Lab 06/09/12 1339 06/08/12 0645 06/06/12 1626 06/06/12 0415 06/05/12 1648 06/05/12 0532 06/04/12 2226  AST -- 61* -- 416* -- 1499* 2606*  ALT -- 501* -- 1154* -- 1435* 1654*  ALKPHOS -- 102 -- 122* -- 108 120*  BILITOT -- 0.3 -- 0.4 -- 0.3 0.3  PROT -- 6.6 -- 7.3 -- 6.8 7.7  ALBUMIN 2.4* 2.4* 2.4* 2.8* 2.7* -- --    Lab 06/05/12 0033  LIPASE 76*  AMYLASE --    Lab 06/05/12 0033  AMMONIA 68*    CBC:  Lab 06/09/12 1339 06/08/12 0725 06/06/12 0415 06/05/12 0532 06/05/12 0033 06/04/12 2226  WBC 13.3* 13.1* 17.6* 17.7* 22.1* --  NEUTROABS -- -- -- -- 20.1* 21.3*  HGB 8.5* 8.5* 9.7*  9.5* 10.3* --  HCT 27.7* 27.9* 31.0* 30.9* 33.5* --  MCV 98.9 98.6 97.5 100.0 99.7 --  PLT 157 138* 185 203 237 --    Cardiac Enzymes:  Lab 06/05/12 2124 06/05/12 1648 06/05/12 0830  CKTOTAL -- -- --  CKMB -- -- --  CKMBINDEX -- -- --  TROPONINI 0.91* 1.21* 1.44*    BNP: No components found with this basename: POCBNP:5  CBG:  Lab 06/10/12 0746 06/10/12 0113 06/09/12 2100 06/09/12 1735 06/09/12 0720  GLUCAP 99 103* 131* 119* 85    Microbiology: Results for orders placed during the hospital encounter of 06/04/12  CULTURE, BLOOD (ROUTINE X 2)     Status: Normal (Preliminary result)   Collection Time   06/04/12 11:58 PM      Component Value Range Status Comment   Specimen Description BLOOD RIGHT ARM   Final    Special Requests BOTTLES DRAWN AEROBIC AND ANAEROBIC 10CC EACH   Final    Culture  Setup Time 06/05/2012 04:09   Final    Culture     Final    Value:        BLOOD CULTURE RECEIVED NO  GROWTH TO DATE CULTURE WILL BE HELD FOR 5 DAYS BEFORE ISSUING A FINAL NEGATIVE REPORT   Report Status PENDING   Incomplete   CULTURE, BLOOD (ROUTINE X 2)     Status: Normal (Preliminary result)   Collection Time   06/05/12 12:05 AM      Component Value Range Status Comment   Specimen Description BLOOD RIGHT HAND   Final    Special Requests BOTTLES DRAWN AEROBIC AND ANAEROBIC 10CC EACH   Final    Culture  Setup Time 06/05/2012 04:09   Final    Culture     Final    Value:        BLOOD CULTURE RECEIVED NO GROWTH TO DATE CULTURE WILL BE HELD FOR 5 DAYS BEFORE ISSUING A FINAL NEGATIVE REPORT   Report Status PENDING   Incomplete   MRSA PCR SCREENING     Status: Normal   Collection Time   06/05/12  6:53 AM      Component Value Range Status Comment   MRSA by PCR NEGATIVE  NEGATIVE Final   CULTURE, RESPIRATORY     Status: Normal   Collection Time   06/05/12  4:12 PM      Component Value Range Status Comment   Specimen Description TRACHEAL ASPIRATE   Final    Special Requests NONE   Final    Gram  Stain     Final    Value: ABUNDANT WBC PRESENT, PREDOMINANTLY PMN     RARE SQUAMOUS EPITHELIAL CELLS PRESENT     FEW GRAM POSITIVE COCCI     IN PAIRS FEW GRAM POSITIVE RODS     FEW YEAST   Culture     Final    Value: ABUNDANT STAPHYLOCOCCUS AUREUS     Note: RIFAMPIN AND GENTAMICIN SHOULD NOT BE USED AS SINGLE DRUGS FOR TREATMENT OF STAPH INFECTIONS.   Report Status 06/09/2012 FINAL   Final    Organism ID, Bacteria STAPHYLOCOCCUS AUREUS   Final     Coagulation Studies: No results found for this basename: LABPROT:5,INR:5 in the last 72 hours  Urinalysis: No results found for this basename: COLORURINE:2,APPERANCEUR:2,LABSPEC:2,PHURINE:2,GLUCOSEU:2,HGBUR:2,BILIRUBINUR:2,KETONESUR:2,PROTEINUR:2,UROBILINOGEN:2,NITRITE:2,LEUKOCYTESUR:2 in the last 72 hours    Imaging: No results found.   Medications:        . albuterol  2 puff Inhalation Q6H  .  ceFAZolin (ANCEF) IV  1 g Intravenous Q24H  . darbepoetin (ARANESP) injection - DIALYSIS  150 mcg Intravenous Q Sat-HD  . escitalopram  20 mg Oral Daily  . feeding supplement (NEPRO CARB STEADY)  1,000 mL Per Tube Q24H  . feeding supplement  30 mL Per Tube 6 X Daily  . heparin  5,000 Units Subcutaneous Q8H  . midodrine  10 mg Oral TID WC  . multivitamin  1 tablet Oral QHS  . pantoprazole (PROTONIX) IV  40 mg Intravenous Q24H  . sevelamer  1,600 mg Oral TID WC  . sodium chloride  3 mL Intravenous Q12H   albuterol, ALPRAZolam, fentaNYL, ondansetron, sevelamer, traMADol  Assessment/ Plan:  ESRD TTS  Shock improved off pressors  Metabolic acidosis and electrolytes stable  Anemia hb dropped to 8's will give darbepoietin   LOS: 6 [name] W @TODAY @11 :55 AM

## 2012-06-10 NOTE — Progress Notes (Signed)
PT Cancellation Note  Patient Details Name: [name] MRN: [id number] DOB: [birth year]   Cancelled Treatment:    Reason Eval/Treat Not Completed: Other (comment) (politely declining PT eval)  Will reattempt PT eval tomorrow;   Van Clines [facility] 06/10/2012, 10:08 AM

## 2012-06-11 DIAGNOSIS — K7200 Acute and subacute hepatic failure without coma: Secondary | ICD-10-CM

## 2012-06-11 LAB — CULTURE, BLOOD (ROUTINE X 2): Culture: NO GROWTH

## 2012-06-11 LAB — CBC
HCT: 28.6 % — ABNORMAL LOW (ref 39.0–52.0)
MCH: 30.1 pg (ref 26.0–34.0)
MCV: 97.9 fL (ref 78.0–100.0)
RDW: 16.4 % — ABNORMAL HIGH (ref 11.5–15.5)
WBC: 8.4 10*3/uL (ref 4.0–10.5)

## 2012-06-11 LAB — RENAL FUNCTION PANEL
Albumin: 2.1 g/dL — ABNORMAL LOW (ref 3.5–5.2)
BUN: 28 mg/dL — ABNORMAL HIGH (ref 6–23)
CO2: 29 mEq/L (ref 19–32)
Calcium: 8.7 mg/dL (ref 8.4–10.5)
Chloride: 100 mEq/L (ref 96–112)
Creatinine, Ser: 4.63 mg/dL — ABNORMAL HIGH (ref 0.50–1.35)

## 2012-06-11 LAB — GLUCOSE, CAPILLARY
Glucose-Capillary: 94 mg/dL (ref 70–99)
Glucose-Capillary: 100 mg/dL — ABNORMAL HIGH (ref 70–99)

## 2012-06-11 LAB — HEPATIC FUNCTION PANEL
AST: 12 U/L (ref 0–37)
Albumin: 2.2 g/dL — ABNORMAL LOW (ref 3.5–5.2)
Alkaline Phosphatase: 82 U/L (ref 39–117)
Total Bilirubin: 0.2 mg/dL — ABNORMAL LOW (ref 0.3–1.2)

## 2012-06-11 MED ORDER — BOOST / RESOURCE BREEZE PO LIQD
1.0000 | Freq: Two times a day (BID) | ORAL | Status: DC
  Administered 2012-06-11 – 2012-06-12 (×3): 1 via ORAL

## 2012-06-11 MED ORDER — OXYCODONE HCL 5 MG PO TABS
5.0000 mg | ORAL_TABLET | Freq: Three times a day (TID) | ORAL | Status: DC | PRN
  Administered 2012-06-11: 5 mg via ORAL
  Filled 2012-06-11: qty 1

## 2012-06-11 MED ORDER — SODIUM CHLORIDE 0.9 % IV SOLN
250.0000 mg | Freq: Every day | INTRAVENOUS | Status: DC
  Administered 2012-06-12: 250 mg via INTRAVENOUS
  Filled 2012-06-11 (×3): qty 20

## 2012-06-11 NOTE — Evaluation (Signed)
Occupational Therapy Evaluation Patient Details Name: Anthany Thornhill MRN: [id number] DOB: [birth year] Today's Date: 06/11/2012 Time: 7846-9629 OT Time Calculation (min): 59 min  OT Assessment / Plan / Recommendation Clinical Impression  This 47 yo male admitted with altered mental status & deafness and found to have Hypercarbic resp failure with VDRF presents to acute OT with problems below. Will benefit from acute OT with follow up HHOT.    OT Assessment  Patient needs continued OT Services    Follow Up Recommendations  Home health OT    Barriers to Discharge None    Equipment Recommendations  None recommended by OT       Frequency  Min 2X/week    Precautions / Restrictions Precautions Precautions: Fall Restrictions Weight Bearing Restrictions: No       ADL  Eating/Feeding: Simulated;Independent Where Assessed - Eating/Feeding: Edge of bed Grooming: Simulated;Supervision/safety;Set up Where Assessed - Grooming: Unsupported sitting Upper Body Bathing: Simulated;Set up;Supervision/safety Where Assessed - Upper Body Bathing: Unsupported sitting Lower Body Bathing: Simulated;+1 Total assistance Where Assessed - Lower Body Bathing: Supine, head of bed up;Supine, head of bed flat;Rolling right and/or left Upper Body Dressing: Simulated;Supervision/safety;Set up Where Assessed - Upper Body Dressing: Unsupported sitting Lower Body Dressing: Simulated;+1 Total assistance Where Assessed - Lower Body Dressing: Supine, head of bed up;Supine, head of bed flat;Rolling right and/or left Transfers/Ambulation Related to ADLs: Transfers NT today due to once we got be up to EOB he started coughing (even coughing up mucous) which was good, but he did this for about 15 minutes straight and totally wore himself out    OT Diagnosis: Generalized weakness  OT Problem List: Decreased strength;Decreased activity tolerance;Impaired balance (sitting and/or standing);Obesity OT Treatment  Interventions: Self-care/ADL training;Therapeutic activities;DME and/or AE instruction;Patient/family education;Balance training   OT Goals Acute Rehab OT Goals OT Goal Formulation: With patient Time For Goal Achievement: 06/18/12 Potential to Achieve Goals: Good Miscellaneous OT Goals Miscellaneous OT Goal #1: Pt will be able to come up to sit EOB with min guard A with HOB elevated in prep for transfers. OT Goal: Miscellaneous Goal #1 - Progress: Goal set today Miscellaneous OT Goal #2: Pt will be able to roll right and left with min A to A with  BADLs OT Goal: Miscellaneous Goal #2 - Progress: Goal set today Miscellaneous OT Goal #3: Pt will be able to transfer to recliner and/or drop arm BSC with transfer board with Min A OT Goal: Miscellaneous Goal #3 - Progress: Goal set today  Visit Information  Last OT Received On: 06/11/12 Assistance Needed: +2 PT/OT Co-Evaluation/Treatment: Yes    Subjective Data  Subjective: I'm not sure about getting into a chair today Patient Stated Goal: To go home   Prior Functioning     Home Living Lives With: Spouse Available Help at Discharge: Family (sister) Type of Home: House Home Access: Ramped entrance Home Layout: One level Bathroom Shower/Tub:  (Sponge baths at this point) Home Adaptive Equipment: Wheelchair - powered;Walker - rolling;Reacher (drop arm BSC,craftmatic bed, van with lift) Prior Function Level of Independence: Needs assistance Needs Assistance: Bathing;Dressing;Feeding;Grooming;Toileting;Meal Prep;Light Housekeeping;Transfers Bath: Moderate Dressing: Moderate Feeding: Supervision/set-up Grooming: Supervision/set-up Toileting: Moderate Meal Prep: Total Light Housekeeping: Total Able to Take Stairs?: No Driving: No Comments: Hs been functioning at wheelchair level, performing sliding board transfers with wife/sister assist Communication Communication: No difficulties Dominant Hand: Right              Cognition  Overall Cognitive Status: Appears within functional limits for tasks assessed/performed Arousal/Alertness: Awake/alert  Orientation Level: Appears intact for tasks assessed Behavior During Session: [facility] for tasks performed    Extremity/Trunk Assessment Right Upper Extremity Assessment RUE ROM/Strength/Tone: Within functional levels RUE ROM/Strength/Tone Deficits: Generally weak Left Upper Extremity Assessment LUE ROM/Strength/Tone: Within functional levels LUE ROM/Strength/Tone Deficits: Generally weak Right Lower Extremity Assessment RLE ROM/Strength/Tone: Deficits RLE ROM/Strength/Tone Deficits: Generalized weakness Left Lower Extremity Assessment LLE ROM/Strength/Tone: Deficits LLE ROM/Strength/Tone Deficits: Generalized weakness     Mobility Bed Mobility Bed Mobility: Supine to Sit;Sitting - Scoot to Edge of Bed Supine to Sit: 4: Min assist;With rails;HOB elevated ( 20 degrees, Legs over first then up with trunk) Sitting - Scoot to Edge of Bed: 4: Min assist Sit to Supine: 1: +2 Total assist;HOB flat Sit to Supine: Patient Percentage: 50% Details for Bed Mobility Assistance: Required physical assist to lift LEs onto bed with sit to supine Transfers Details for Transfer Assistance: Simulated lateral scoot at EOB with scooting towards HOB in prep for laying down; PT with difficulty shifting weight onto feet to unweigh hips for scooting; It is quite possible that in a situation that more approximates his typical transfer situation (sliding board transfer to power chair) he will be able to perform better           Balance Balance Balance Assessed: Yes Static Sitting Balance Static Sitting - Balance Support: Right upper extremity supported;Left upper extremity supported;Feet supported Static Sitting - Level of Assistance: 7: Independent Static Sitting - Comment/# of Minutes: 20 minutes   End of Session OT - End of Session Equipment Utilized During Treatment:   (None--only to EOB) Activity Tolerance: Patient limited by fatigue (limited by excessive coughing) Patient left: in bed;with call bell/phone within reach       Evette Georges [phone] 06/11/2012, 4:45 PM

## 2012-06-11 NOTE — Progress Notes (Signed)
[facility] PROGRESS NOTE  [name] JXB:[id number] DOB: 1966/04/25 DOA: 06/04/2012 PCP: Pcp Not In System  Assessment/Plan:  HISTORY OF PRESENT ILLNESS: 24 /M morbidly obese, ESRD - on HD T/T/S BIBEMS for chief c/o acute onset decreased hearing and tinnitus.As per his wife he has been afebrile, not complaining of pain, not coughing. Labs showed leucocytosis & elevated liver enzymes with nml bili & INR. Of note nml LFTs in 11/13 with nml hep profile. Of note, patient in resp acidosis at admit and took more opiates than usual.  Admitted to ICU for potential CVVH.  Of note, head CT neg & US abdomen - Status post cholecystectomy and right nephrectomy. Large fluid collection in the right renal fossa correlates with liquefaction of hematoma seen on prior CT scan.  PMH - adm 10/13 for retroperitoneal hematoma, has IVC filter with DVT  1-Hypercarbic resp failure : S/p extubation 06/08/11, in setting of respiratory acidosis, encephalopathy, opioid use.  Sat on 4 L. Was not on oxygen at home. will need evaluation for home oxygen use. Will reassess Oxygen saturation after dialysis.   2-Septic Shock: Probably secondary to PNA. Patient now off pressors. Cont with midodrine.   3-MSSA PNA: Based on trach culture 06/05/12 and admit MRSA PCR negative. Patient received 5 days of vancomycin and ciprofloxacin. Started on Ancef 01-11. Day 7 antibiotics. WBC decrease to 8.. Will consider Bactrim at time of discharge.   4-ESRD: T/Th/S: Continue with dialysis. Initially he was on CVVH. Dialysis tomorrow.  5-Transaminases: Shock liver: hep profile neg 11/13, neg tylenol.  Improving LFT. Repeat LFT today pending.  6-DVT 10/13 s/p IVC filter, h/o retroperitoneal bleed: Hold heparin due to prior retroperitoneal bleed.  7-Chronic Pain Syndrome:  chronic back pain on oxycodone 30mg  bid prn. Dr Andris Baumann [phone].  On 06/08/12 got encephalopathich after receiving long acting opioid. Continue with low dose IV fentanyl.  Pain no controlled. Will give very low dose oxycodone.  8-Encephalopathy: Multifactorial. Secondary to opioid, infection. Improved.  9-Anemia of chronic diseases and acute illness. Started on ARANESP. 10.Diabetes ? : Check Hb-A1c. SSI.  Code Status: Full. Family Communication: Care Discussed with Patient. I spoke with sister today, gave update.  Disposition Plan: PT, OT consult. Discharge in 1 or 2 days depending on PT recommendation, and oxygen saturation.   SIGNIFICANT EVENTS:  1/7 low BP- tr to 2300. Intubated due to resp acidosis. On CVVH. On pressors  06/06/12: Off pressors. Hearing is regained equally and bialterally. Normal WUA. Doing well on SBt and looked good for extubation but showed resp acidosis  06/07/12 - extubated  06/08/12: Got 60mg  oxycontin yesterday morning -> held last night due to drowsiness, given 60mg  again this morning -> drowsy Again (d/w sister - baseline dose is the short acting oxycodone at half of what he got with [name] at 30mg  prn which he normally needs bid). Sister DPOA says he behaves likes this when he gets lot of opiates   Consultants:  Dr. Hyman Hopes, renal  CCM    Procedures: ETT 06/05/12 >>06/07/12  Rt IJ HD trialysis cath 06/05/12>>   Antibiotics: cipro 1/7 >>1//11  vanc 1/7 >>1/11  Flagyl 1/7 >>1/11  Ancef 1/11 >>   HPI/Subjective: Feeling ok, no worsening SOB. Complaining of chronic pain.    Objective: Filed Vitals:   06/11/12 0607 06/11/12 0727 06/11/12 0924 06/11/12 1436  BP: 122/62  115/58 120/69  Pulse: 71  68 61  Temp: 98.5 F (36.9 C)  98.5 F (36.9 C) 98.4 F (36.9 C)  TempSrc: Oral  Oral Oral  Resp: 18  18 18   Height:      Weight:      SpO2: 95% 94% 92% 93%    Intake/Output Summary (Last 24 hours) at 06/11/12 1546 Last data filed at 06/11/12 1300  Gross per 24 hour  Intake    640 ml  Output      0 ml  Net    640 ml   Filed Weights   06/09/12 1759 06/09/12 2103 06/10/12 2005  Weight: 142 kg (313 lb 0.9 oz) 141.6 kg (312 lb  2.7 oz) 141.599 kg (312 lb 2.7 oz)    Exam:   General:  No distress.  Cardiovascular: S 1, S 2 RRR  Respiratory: Crackles bases  Abdomen: BS present, soft.  Extremities; No edema.   Data Reviewed: Basic Metabolic Panel:  Lab 06/09/12 4782 06/08/12 0645 06/06/12 1626 06/06/12 0415 06/05/12 1648  NA 135 135 136 137 139  K 3.9 3.9 3.7 3.9 3.9  CL 97 99 101 98 99  CO2 21 23 21 21 20   GLUCOSE 111* 100* 124* 124* 150*  BUN 31* 50* 32* 36* 47*  CREATININE 4.09* 5.04* 4.06* 4.68* 6.03*  CALCIUM 9.0 8.9 8.8 9.1 8.5  MG -- -- -- -- --  PHOS 3.3 4.7* 2.9 3.7 5.5*   Liver Function Tests:  Lab 06/09/12 1339 06/08/12 0645 06/06/12 1626 06/06/12 0415 06/05/12 1648 06/05/12 0532 06/04/12 2226  AST -- 61* -- 416* -- 1499* 2606*  ALT -- 501* -- 1154* -- 1435* 1654*  ALKPHOS -- 102 -- 122* -- 108 120*  BILITOT -- 0.3 -- 0.4 -- 0.3 0.3  PROT -- 6.6 -- 7.3 -- 6.8 7.7  ALBUMIN 2.4* 2.4* 2.4* 2.8* 2.7* -- --    Lab 06/05/12 0033  LIPASE 76*  AMYLASE --    Lab 06/05/12 0033  AMMONIA 68*   CBC:  Lab 06/09/12 1339 06/08/12 0725 06/06/12 0415 06/05/12 0532 06/05/12 0033 06/04/12 2226  WBC 13.3* 13.1* 17.6* 17.7* 22.1* --  NEUTROABS -- -- -- -- 20.1* 21.3*  HGB 8.5* 8.5* 9.7* 9.5* 10.3* --  HCT 27.7* 27.9* 31.0* 30.9* 33.5* --  MCV 98.9 98.6 97.5 100.0 99.7 --  PLT 157 138* 185 203 237 --   Cardiac Enzymes:  Lab 06/05/12 2124 06/05/12 1648 06/05/12 0830  CKTOTAL -- -- --  CKMB -- -- --  CKMBINDEX -- -- --  TROPONINI 0.91* 1.21* 1.44*   BNP (last 3 results) No results found for this basename: PROBNP:3 in the last 8760 hours CBG:  Lab 06/11/12 1130 06/11/12 0756 06/10/12 1718 06/10/12 0746 06/10/12 0113  GLUCAP 100* 94 125* 99 103*    Recent Results (from the past 240 hour(s))  CULTURE, BLOOD (ROUTINE X 2)     Status: Normal   Collection Time   06/04/12 11:58 PM      Component Value Range Status Comment   Specimen Description BLOOD RIGHT ARM   Final    Special  Requests BOTTLES DRAWN AEROBIC AND ANAEROBIC 10CC EACH   Final    Culture  Setup Time 06/05/2012 04:09   Final    Culture NO GROWTH 5 DAYS   Final    Report Status 06/11/2012 FINAL   Final   CULTURE, BLOOD (ROUTINE X 2)     Status: Normal   Collection Time   06/05/12 12:05 AM      Component Value Range Status Comment   Specimen Description BLOOD RIGHT HAND   Final    Special  Requests BOTTLES DRAWN AEROBIC AND ANAEROBIC 10CC EACH   Final    Culture  Setup Time 06/05/2012 04:09   Final    Culture NO GROWTH 5 DAYS   Final    Report Status 06/11/2012 FINAL   Final   MRSA PCR SCREENING     Status: Normal   Collection Time   06/05/12  6:53 AM      Component Value Range Status Comment   MRSA by PCR NEGATIVE  NEGATIVE Final   CULTURE, RESPIRATORY     Status: Normal   Collection Time   06/05/12  4:12 PM      Component Value Range Status Comment   Specimen Description TRACHEAL ASPIRATE   Final    Special Requests NONE   Final    Gram Stain     Final    Value: ABUNDANT WBC PRESENT, PREDOMINANTLY PMN     RARE SQUAMOUS EPITHELIAL CELLS PRESENT     FEW GRAM POSITIVE COCCI     IN PAIRS FEW GRAM POSITIVE RODS     FEW YEAST   Culture     Final    Value: ABUNDANT STAPHYLOCOCCUS AUREUS     Note: RIFAMPIN AND GENTAMICIN SHOULD NOT BE USED AS SINGLE DRUGS FOR TREATMENT OF STAPH INFECTIONS.   Report Status 06/09/2012 FINAL   Final    Organism ID, Bacteria STAPHYLOCOCCUS AUREUS   Final      Studies: No results found.  Scheduled Meds:    . albuterol  2 puff Inhalation Q6H  .  ceFAZolin (ANCEF) IV  1 g Intravenous Q24H  . ciprofloxacin  1 drop Left Eye Q2H while awake  . darbepoetin (ARANESP) injection - DIALYSIS  150 mcg Intravenous Q Sat-HD  . escitalopram  20 mg Oral Daily  . feeding supplement  1 Container Oral BID BM  . ferric gluconate (FERRLECIT/NULECIT) IV  250 mg Intravenous Daily  . insulin aspart  0-9 Units Subcutaneous TID WC  . midodrine  10 mg Oral TID WC  . multivitamin  1 tablet  Oral QHS  . pantoprazole (PROTONIX) IV  40 mg Intravenous Q24H  . sevelamer  1,600 mg Oral TID WC  . sodium chloride  3 mL Intravenous Q12H   Continuous Infusions:   Principal Problem:  *Transaminitis Active Problems:  ESRD (end stage renal disease) on dialysis  Hearing loss  Septic shock(785.52)  Acute respiratory failure    Time spent: 35 minutes.     Melo Stauber  [facility] Pager [phone]. If 8PM-8AM, please contact night-coverage at [email or web address], password [facility] 06/11/2012, 3:46 PM  LOS: 7 days

## 2012-06-11 NOTE — Progress Notes (Signed)
Physical Therapy Evaluation Patient Details Name: [name] MRN: [id number] DOB: 04/14/1966 Today's Date: 06/11/2012 Time: 4098-1191 PT Time Calculation (min): 24 min  PT Assessment / Plan / Recommendation Clinical Impression  47 yo male admitted with transaminitis among many comorbidities including ESRD, and hip fracture (in early 2013); Presents with decr functional mobility; Will benefit from PT to maximize mobility and transfers and facilitate safe return to PLOF and enable safe dc home  While scoot transfer was difficult today, it is likely pt will do better with setup more like his setup at home (bed to powerchair with sliding board    PT Assessment  Patient needs continued PT services    Follow Up Recommendations  Home health PT;Supervision/Assistance - 24 hour    Does the patient have the potential to tolerate intense rehabilitation      Barriers to Discharge        Equipment Recommendations  None recommended by PT (pretty well-equipped)    Recommendations for Other Services OT consult   Frequency Min 3X/week    Precautions / Restrictions Precautions Precautions: Fall Restrictions Weight Bearing Restrictions: No   Pertinent Vitals/Pain Session conducted on 3 L supplemental O2 via  O2 sats decr to as low as 86% (observed lowest) Encouraged cough and focused breathing O2 sats 91% end of session (pt resting supine)      Mobility  Bed Mobility Bed Mobility: Supine to Sit;Sitting - Scoot to Edge of Bed Supine to Sit: 4: Min assist;With rails;HOB elevated ( 20 degrees, Legs over first then up with trunk) Sitting - Scoot to Edge of Bed: 4: Min assist Sit to Supine: 1: +2 Total assist;HOB flat Sit to Supine: Patient Percentage: 50% Details for Bed Mobility Assistance: Required physical assist to lift LEs onto bed with sit to supine Transfers Transfers: Lateral/Scoot Transfers Lateral/Scoot Transfers: 1: +2 Total assist Lateral Transfers: Patient Percentage:  20% Details for Transfer Assistance: Simulated lateral scoot at EOB with scooting towards HOB in prep for laying down; PT with difficulty shifting weight onto feet to unweigh hips for scooting; It is quite possible that in a situation that more approximates his typical transfer situation (sliding board transfer to power chair) he will be able to perform better Ambulation/Gait Ambulation/Gait Assistance: Other (comment) (has been non-ambulatory since hip fx last late March)    Shoulder Instructions     Exercises     PT Diagnosis: Generalized weakness  PT Problem List: Decreased strength;Decreased range of motion;Decreased activity tolerance;Decreased balance;Decreased mobility;Decreased coordination;Cardiopulmonary status limiting activity;Pain;Obesity PT Treatment Interventions: DME instruction;Functional mobility training;Gait training;Therapeutic activities;Therapeutic exercise;Balance training;Patient/family education;Wheelchair mobility training   PT Goals Acute Rehab PT Goals PT Goal Formulation: With patient Time For Goal Achievement: 06/18/12 Potential to Achieve Goals: Good Pt will go Supine/Side to Sit: with supervision PT Goal: Supine/Side to Sit - Progress: Goal set today Pt will go Sit to Supine/Side: with supervision PT Goal: Sit to Supine/Side - Progress: Goal set today Pt will Transfer Bed to Chair/Chair to Bed: with supervision PT Transfer Goal: Bed to Chair/Chair to Bed - Progress: Goal set today  Visit Information  Last PT Received On: 06/11/12 Assistance Needed: +2    Subjective Data  Subjective: Agrees to PT/OT evals Patient Stated Goal: Home   Prior Functioning  Home Living Lives With: Spouse Available Help at Discharge: Family (sister) -- Pt reports wife recently fractured her foot Type of Home: House Home Access: Ramped entrance Home Layout: One level Bathroom Shower/Tub:  (Sponge baths at this point) Home Adaptive  Equipment: Wheelchair - powered;Walker  - rolling;Reacher (drop arm BSC,craftmatic bed, van with lift) Prior Function Level of Independence: Needs assistance Needs Assistance: Bathing;Dressing;Feeding;Grooming;Toileting;Meal Prep;Light Housekeeping;Transfers Bath: Moderate Dressing: Moderate Feeding: Supervision/set-up Grooming: Supervision/set-up Toileting: Moderate Meal Prep: Total Light Housekeeping: Total Able to Take Stairs?: No Driving: No Comments: Hs been functioning at wheelchair level, performing sliding board transfers with wife/sister assist Communication Communication: No difficulties Dominant Hand: Right    Cognition  Overall Cognitive Status: Appears within functional limits for tasks assessed/performed Arousal/Alertness: Awake/alert Orientation Level: Appears intact for tasks assessed Behavior During Session: [facility] for tasks performed    Extremity/Trunk Assessment Right Upper Extremity Assessment RUE ROM/Strength/Tone: Within functional levels RUE ROM/Strength/Tone Deficits: Generally weak Left Upper Extremity Assessment LUE ROM/Strength/Tone: Within functional levels LUE ROM/Strength/Tone Deficits: Generally weak Right Lower Extremity Assessment RLE ROM/Strength/Tone: Deficits RLE ROM/Strength/Tone Deficits: Generalized weakness Left Lower Extremity Assessment LLE ROM/Strength/Tone: Deficits LLE ROM/Strength/Tone Deficits: Generalized weakness   Balance Balance Balance Assessed: Yes Static Sitting Balance Static Sitting - Balance Support: Right upper extremity supported;Left upper extremity supported;Feet supported Static Sitting - Level of Assistance: 7: Independent Static Sitting - Comment/# of Minutes: 20 minutes  End of Session PT - End of Session Equipment Utilized During Treatment: Oxygen Activity Tolerance: Patient limited by fatigue Patient left: in bed;with call bell/phone within reach Nurse Communication: Mobility status  GP     Van Clines [facility] [place], [place]  [phone]  06/11/2012, 4:41 PM

## 2012-06-11 NOTE — Progress Notes (Signed)
Patient ID: [name], male   DOB: 07-15-1965, 47 y.o.   MRN: [id number]   [facility] Progress Note    Subjective:   No acute/emerging complaints-inquires about discharge    Objective:   BP 115/58  Pulse 68  Temp 98.5 F (36.9 C) (Oral)  Resp 18  Ht 6' 0.5" (1.842 m)  Wt 141.599 kg (312 lb 2.7 oz)  BMI 41.76 kg/m2  SpO2 92%  Physical Exam: Gen: Comfortably sleeping in bed, easy to awaken/arouse CVS: pulse regular in rate and rhythm, heart sounds S1 and S2 normal Resp: Coarse breath sounds bibasally, no rales/rhonchi Abd: Soft, obese, nontender and bowel sounds are normal Ext: Chronic lymphedema-no pitting edema  Labs: BMET  Lab 06/09/12 1339 06/08/12 0645 06/06/12 1626 06/06/12 0415 06/05/12 1648 06/05/12 0532 06/04/12 2226  NA 135 135 136 137 139 138 140  K 3.9 3.9 3.7 3.9 3.9 4.0 4.2  CL 97 99 101 98 99 100 100  CO2 21 23 21 21 20 24 25   GLUCOSE 111* 100* 124* 124* 150* 126* 143*  BUN 31* 50* 32* 36* 47* 48* 43*  CREATININE 4.09* 5.04* 4.06* 4.68* 6.03* 6.21* 6.19*  ALB -- -- -- -- -- -- --  CALCIUM 9.0 8.9 8.8 9.1 8.5 8.5 9.4  PHOS 3.3 4.7* 2.9 3.7 5.5* -- --   CBC  Lab 06/09/12 1339 06/08/12 0725 06/06/12 0415 06/05/12 0532 06/05/12 0033 06/04/12 2226  WBC 13.3* 13.1* 17.6* 17.7* -- --  NEUTROABS -- -- -- -- 20.1* 21.3*  HGB 8.5* 8.5* 9.7* 9.5* -- --  HCT 27.7* 27.9* 31.0* 30.9* -- --  MCV 98.9 98.6 97.5 100.0 -- --  PLT 157 138* 185 203 -- --    Medications:      . albuterol  2 puff Inhalation Q6H  .  ceFAZolin (ANCEF) IV  1 g Intravenous Q24H  . ciprofloxacin  1 drop Left Eye Q2H while awake  . darbepoetin (ARANESP) injection - DIALYSIS  150 mcg Intravenous Q Sat-HD  . escitalopram  20 mg Oral Daily  . feeding supplement  1 Container Oral BID BM  . ferric gluconate (FERRLECIT/NULECIT) IV  250 mg Intravenous Daily  . heparin  5,000 Units Subcutaneous Q8H  . insulin aspart  0-9 Units Subcutaneous TID WC  . midodrine  10 mg Oral  TID WC  . multivitamin  1 tablet Oral QHS  . pantoprazole (PROTONIX) IV  40 mg Intravenous Q24H  . sevelamer  1,600 mg Oral TID WC  . sodium chloride  3 mL Intravenous Q12H     Assessment/ Plan:   1. Hypercarbic respiratory failure with MSSA pneumonia: Appears to be recovering, doing well off of the ventilator. Suspected that hypercarbia was due to narcotic effect as well. Anticipated discharge relatively soon. He is on Ancef monotherapy at this time. 2.ESRD; continue on a Tuesday, Thursday, Saturday dialysis schedule per his outpatient prescription-no acute dialysis needs at this time. 3. 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 Hypotension: On midodrine for blood pressure support, continue to monitor during dialysis   Zetta Bills, MD 06/11/2012, 11:23 AM

## 2012-06-11 NOTE — Progress Notes (Signed)
NUTRITION FOLLOW UP  Intervention:   1. Resource Breeze po BID, each supplement provides 250 kcal and 9 grams of protein. 2. Recommend Carbohydrate Modified High diet to better meet calorie needs. 3. Discontinue Nepro and Prostat 4. RD to continue to follow nutrition care plan  Nutrition Dx:   Inadequate oral intake now related to poor appetite as evidenced by poor meal completion.  Goal:   Pt to meet >/= 90% of their estimated nutrition needs. Unmet.  Monitor:   weight trends, lab trends, I/O's, PO intake, supplement tolerance  Assessment:   Extubated 1/9. SLP evaluation on 1/9 recommending regular diet with thin liquids. Continues on TTS schedule for HD, metabolic acidosis and electrolytes stable.  TF discontinue with extubation, RD to discontinue orders for Prostat and Nepro Enteral Nutrition. Pt states that his intake is variable and he typically does not eat breakfast. Consumed 0% of breakfast. States he does better with lunch and dinner. Agreeable to trying [employer]. He notes that he has tried most other supplements (Beneprotein and Nepro) and doesn't care for them. He states that he is able to tolerate his HS snack of sandwiches and enjoys the [place] sandwiches best.  He also notes that he had Gastric Sleeve surgery 2 years ago and weighed approximately 800 lb prior to that.  Height: Ht Readings from Last 1 Encounters:  06/10/12 6' 0.5" (1.842 m)    Weight Status:   Wt Readings from Last 1 Encounters:  06/10/12 312 lb 2.7 oz (141.599 kg)  Wt was 339 lb on admission; significantly decreased 2/2 fluid removal.  Re-estimated needs:  Kcal: 2500 - 2700 kcal Protein: 150 - 175 grams Fluid: 1.2 liters daily  Skin: intact  Diet Order: Carb Control Medium (1600 - 2000)   Intake/Output Summary (Last 24 hours) at 06/11/12 0954 Last data filed at 06/11/12 0900  Gross per 24 hour  Intake    712 ml  Output      0 ml  Net    712 ml    Last BM:  unknown   Labs:   Lab 06/09/12 1339 06/08/12 0645 06/06/12 1626  NA 135 135 136  K 3.9 3.9 3.7  CL 97 99 101  CO2 21 23 21   BUN 31* 50* 32*  CREATININE 4.09* 5.04* 4.06*  CALCIUM 9.0 8.9 8.8  MG -- -- --  PHOS 3.3 4.7* 2.9  GLUCOSE 111* 100* 124*    CBG (last 3)   Basename 06/11/12 0756 06/10/12 1718 06/10/12 0746  GLUCAP 94 125* 99    Scheduled Meds:   . albuterol  2 puff Inhalation Q6H  .  ceFAZolin (ANCEF) IV  1 g Intravenous Q24H  . ciprofloxacin  1 drop Left Eye Q2H while awake  . darbepoetin (ARANESP) injection - DIALYSIS  150 mcg Intravenous Q Sat-HD  . escitalopram  20 mg Oral Daily  . feeding supplement (NEPRO CARB STEADY)  1,000 mL Per Tube Q24H  . feeding supplement  30 mL Per Tube 6 X Daily  . heparin  5,000 Units Subcutaneous Q8H  . insulin aspart  0-9 Units Subcutaneous TID WC  . midodrine  10 mg Oral TID WC  . multivitamin  1 tablet Oral QHS  . pantoprazole (PROTONIX) IV  40 mg Intravenous Q24H  . sevelamer  1,600 mg Oral TID WC  . sodium chloride  3 mL Intravenous Q12H    Continuous Infusions:   Jarold Motto MS, RD, LDN Pager: [phone] After-hours pager: [phone]

## 2012-06-11 NOTE — Progress Notes (Signed)
Spoke with pt about CPAP and he stated that he wanted to wear CPAP tonight. RT set up device and after putting it on patient he stated that he has changed his mind and did not want to wear. RT tried multiple times to readjust mask and and to make more comfortable for pt be he still said that he did not want to wear. Pt is maintaining well at this time. HR 86, RR 20, Sats 100 on 4L . RT told pt if he changed his mind to call.

## 2012-06-12 LAB — CBC
HCT: 27.3 % — ABNORMAL LOW (ref 39.0–52.0)
Hemoglobin: 8.4 g/dL — ABNORMAL LOW (ref 13.0–17.0)
MCV: 97.5 fL (ref 78.0–100.0)
RBC: 2.8 MIL/uL — ABNORMAL LOW (ref 4.22–5.81)
WBC: 9 10*3/uL (ref 4.0–10.5)

## 2012-06-12 LAB — RENAL FUNCTION PANEL
BUN: 33 mg/dL — ABNORMAL HIGH (ref 6–23)
CO2: 26 mEq/L (ref 19–32)
Chloride: 101 mEq/L (ref 96–112)
Creatinine, Ser: 5.16 mg/dL — ABNORMAL HIGH (ref 0.50–1.35)
GFR calc non Af Amer: 12 mL/min — ABNORMAL LOW (ref >90)
Glucose, Bld: 81 mg/dL (ref 70–99)

## 2012-06-12 LAB — GLUCOSE, CAPILLARY: Glucose-Capillary: 81 mg/dL (ref 70–99)

## 2012-06-12 MED ORDER — NEOMYCIN-BACITRACIN ZN-POLYMYX 5-400-10000 OP OINT
TOPICAL_OINTMENT | OPHTHALMIC | Status: DC
  Filled 2012-06-12: qty 3.5

## 2012-06-12 MED ORDER — MIDODRINE HCL 5 MG PO TABS
ORAL_TABLET | ORAL | Status: AC
  Administered 2012-06-12: 10 mg via ORAL
  Filled 2012-06-12: qty 2

## 2012-06-12 MED ORDER — BACITRACIN-POLYMYXIN B 500-10000 UNIT/GM OP OINT
TOPICAL_OINTMENT | OPHTHALMIC | Status: DC
  Filled 2012-06-12: qty 3.5

## 2012-06-12 MED ORDER — SULFAMETHOXAZOLE-TRIMETHOPRIM 800-160 MG PO TABS: 1.0000 | ORAL_TABLET | Freq: Two times a day (BID) | ORAL | Status: DC

## 2012-06-12 MED ORDER — CEFAZOLIN SODIUM-DEXTROSE 2-3 GM-% IV SOLR
2.0000 g | Freq: Once | INTRAVENOUS | Status: AC
  Administered 2012-06-12: 2 g via INTRAVENOUS
  Filled 2012-06-12: qty 50

## 2012-06-12 MED ORDER — CEFAZOLIN SODIUM-DEXTROSE 2-3 GM-% IV SOLR
2.0000 g | INTRAVENOUS | Status: DC
  Filled 2012-06-12: qty 50

## 2012-06-12 MED ORDER — ALPRAZOLAM 1 MG PO TABS: ORAL_TABLET | ORAL | Status: DC

## 2012-06-12 MED ORDER — OXYCODONE HCL 5 MG PO TABS: 5.0000 mg | ORAL_TABLET | Freq: Three times a day (TID) | ORAL | Status: DC | PRN

## 2012-06-12 MED ORDER — ALBUTEROL SULFATE HFA 108 (90 BASE) MCG/ACT IN AERS: 2.0000 | INHALATION_SPRAY | Freq: Four times a day (QID) | RESPIRATORY_TRACT | Status: DC

## 2012-06-12 MED ORDER — CEFAZOLIN SODIUM-DEXTROSE 2-3 GM-% IV SOLR: 2.0000 g | INTRAVENOUS | Status: DC

## 2012-06-12 NOTE — Procedures (Signed)
Patient seen on Hemodialysis. QB 400, UF goal 2.5L Treatment adjusted as needed.  Zetta Bills MD [facility] Office # [phone] Pager # [phone] 9:51 AM

## 2012-06-12 NOTE — Progress Notes (Signed)
Physical Therapy Treatment Patient Details Name: [name] MRN: [id number] DOB: [birth year] Today's Date: 06/12/2012 Time: 4098-1191 PT Time Calculation (min): 30 min  PT Assessment / Plan / Recommendation Comments on Treatment Session  Performed sliding board transfer adequately for dc home with plenty of assist; In fact, I posit that pt will perform mobility and transfers even better at his own home with his own equipment in his own environment; Discussed pt status with RN and MD    Follow Up Recommendations  Home health PT;Supervision/Assistance - 24 hour     Does the patient have the potential to tolerate intense rehabilitation     Barriers to Discharge        Equipment Recommendations  None recommended by PT    Recommendations for Other Services    Frequency Min 3X/week   Plan Discharge plan remains appropriate    Precautions / Restrictions Precautions Precautions: Fall   Pertinent Vitals/Pain Opted to conduct session with supplemental O2 (4L via [place]) as pt had desatted during yesterday's session with very brief bouts on just room air    Mobility  Bed Mobility Bed Mobility: Supine to Sit;Sitting - Scoot to Edge of Bed Supine to Sit: 4: Min assist;With rails;HOB elevated Sitting - Scoot to Edge of Bed: 4: Min guard;With rail Details for Bed Mobility Assistance: simply needed minimal handheld assist to pull up/elevate trunk off of bed; Good job moving feet to EOB Transfers Transfers: Lateral/Scoot Transfers Lateral/Scoot Transfers: 3: Mod assist Details for Transfer Assistance: Lateral scoot bed to drop-arm recliner towards pt's Right side using sliding board; Per pt, his sister hods his pants to assist him across sliding board, and we used drawsheet with mod assist; It is likely that pt will be able to perform transfer even better his his own equipment and his familiar situation at home    Exercises     PT Diagnosis:    PT Problem List:   PT Treatment  Interventions:     PT Goals Acute Rehab PT Goals Time For Goal Achievement: 06/18/12 Potential to Achieve Goals: Good Pt will go Supine/Side to Sit: with supervision PT Goal: Supine/Side to Sit - Progress: Progressing toward goal Pt will Transfer Bed to Chair/Chair to Bed: with supervision PT Transfer Goal: Bed to Chair/Chair to Bed - Progress: Progressing toward goal  Visit Information  Last PT Received On: 06/12/12 Assistance Needed: +2 (mostly for safety due to body habitus)    Subjective Data  Subjective: Agreeable to OOB; REALLY wanting to dc home Patient Stated Goal: Home   Cognition  Overall Cognitive Status: Appears within functional limits for tasks assessed/performed Arousal/Alertness: Awake/alert Orientation Level: Appears intact for tasks assessed Behavior During Session: [facility] for tasks performed    Balance  Static Sitting Balance Static Sitting - Balance Support: Right upper extremity supported;Left upper extremity supported;Feet supported Static Sitting - Level of Assistance: 7: Independent Static Sitting - Comment/# of Minutes: Better sitting tolerance today -- some coughing, not nearly as much or as taxing as yesterday, and pt was able to perform transfer after  End of Session PT - End of Session Equipment Utilized During Treatment: Oxygen (sliding board) Activity Tolerance: Patient tolerated treatment well Patient left: in chair;with call bell/phone within reach Nurse Communication: Mobility status   GP     Van Clines [facility] [place], [place] [phone]  06/12/2012, 4:46 PM

## 2012-06-12 NOTE — Care Management Note (Signed)
   CARE MANAGEMENT NOTE 06/12/2012  Patient:  [name], [name]   Account Number:  [id number]  Date Initiated:  06/11/2012  Documentation initiated by:  Maryl Blalock  Subjective/Objective Assessment:   CM following for progressiona dn d/c needs.     Action/Plan:   Met with pt re HH needs, pt has used [facility] previously and wishes to use that agency again. [facility] notified.   Anticipated DC Date:  06/12/2012   Anticipated DC Plan:  HOME W HOME HEALTH SERVICES         [facility]  HOME HEALTH   Choice offered to / List presented to:  C-1 Patient        HH arranged  HH-1 RN  HH-2 PT  HH-3 OT  HH-4 NURSE'S AIDE      HH agency  [facility]   Status of service:  Completed, signed off Medicare Important Message given?   (If response is "NO", the following Medicare IM given date fields will be blank) Date Medicare IM given:   Date Additional Medicare IM given:    Discharge Disposition:  HOME W HOME HEALTH SERVICES  Per UR Regulation:    If discussed at Long Length of Stay Meetings, dates discussed:    Comments:

## 2012-06-12 NOTE — Discharge Summary (Signed)
Physician Discharge Summary  [name] MVH:[id number] DOB: Mar 02, 1966 DOA: 06/04/2012  PCP: Pcp Not In System  Admit date: 06/04/2012 Discharge date: 06/12/2012  Time spent: 35 minutes  Recommendations for Outpatient Follow-up:  1. Need to followup with PCP to assess  resolution of pneumonia. He would need repeated liver function test. He will  need hemoglobin check.   Discharge Diagnoses:   Acute respiratory failure  Septic Shock,Probably secondary to PNA.   MSSA PNA  Encephalopathy: Multifactorial.  ESRD (end stage renal disease) on dialysis  Hearing loss, resolved.  DVT 10/13 s/p IVC filter, h/o retroperitoneal bleed  Anemia of chronic diseases and acute illness  Transaminitis, Shock liver  Chronic pain syndrome.  Discharge Condition: Stable  Diet recommendation: Renal diet.  Filed Weights   06/12/12 0523 06/12/12 0752 06/12/12 1224  Weight: 142.4 kg (313 lb 15 oz) 143.7 kg (316 lb 12.8 oz) 141 kg (310 lb 13.6 oz)    History of present illness:  47 /M morbidly obese, ESRD - on HD T/T/S BIBEMS for chief c/o acute onset decreased hearing and tinnitus.As per his wife he has been afebrile, not complaining of pain, not coughing. Labs showed leucocytosis & elevated liver enzymes with nml bili & INR. Of note nml LFTs in 11/13 with nml hep profile.  Low BP recorded intermittently ? Cuff issues , 1.5 L fluid given, empiric abx & admitted to ICU for potential CVVH.  Of note, head CT neg & US abdomen - Status post cholecystectomy and right nephrectomy. Large fluid collection in the right renal fossa correlates with liquefaction of hematoma seen on prior CT scan.  PMH - adm 10/13 for retroperitoneal hematoma, has IVC filter with DVT   Hospital Course:  1-Hypercarbic resp failure : S/p extubation 06/08/11, in setting of respiratory acidosis, encephalopathy, opioid use.  Sat on 4 L. Was not on oxygen at home. will need evaluation for home oxygen use. reassess Oxygen saturation after  dialysis. Patient only requiring 2 L of oxygen after dialysis. He wishes to go home.  2-Septic Shock: Probably secondary to PNA. Patient now off pressors. Cont with midodrine.  3-MSSA PNA: Based on trach culture 06/05/12 and admit MRSA PCR negative. Patient received 5 days of vancomycin and ciprofloxacin. Started on Ancef 01-11. Day 7 antibiotics. WBC decrease to 8.. patient will be discharged on Bactrim  for 7 more day .  4-ESRD: T/Th/S: Continue with dialysis. Initially he was on CVVH. Patient received dialysis the date of discharge. 5-Transaminases: Shock liver: hep profile neg 11/13, neg tylenol. Improving LFT. Liver function tests normalize AST at 12 ALT at 49 bilirubin 0.1. During this hospitalization ALT peaked to 501 AST 61. 6-DVT 10/13 s/p IVC filter, h/o retroperitoneal bleed: Hold heparin due to prior retroperitoneal bleed.  7-Chronic Pain Syndrome: chronic back pain on oxycodone 30mg  bid prn. Dr Andris Baumann [phone].  On 06/08/12 got encephalopathich after receiving long acting opioid. Patient was receiving  low dose IV fentanyl. Pain no controlled. He was started on low dose oxycodone. 5 mg every 8 hours when necessary for pain. No sedation was observed with this low dose. I will stop long acting opioid at discharge. He will need to followup with his pain clinic. 8-Encephalopathy: Multifactorial. Secondary to opioid, infection. Resolved. 9-Anemia of chronic diseases and acute illness. Started on ARANESP.  10.Diabetes ? . SSI.  SIGNIFICANT EVENTS:  1/7 low BP- tr to 2300. Intubated due to resp acidosis. On CVVH. On pressors  06/06/12: Off pressors. Hearing is regained equally and bialterally. Normal  WUA. Doing well on SBt and looked good for extubation but showed resp acidosis  06/07/12 - extubated  06/08/12: Got 60mg  oxycontin yesterday morning -> held last night due to drowsiness, given 60mg  again this morning -> drowsy Again (d/w sister - baseline dose is the short acting oxycodone at half of  what he got with [name] at 30mg  prn which he normally needs bid). Sister DPOA says he behaves likes this when he gets lot of opiates   Consultations: Dr. Hyman Hopes, renal  CCM  Procedures:  ETT 06/05/12 >>06/07/12  Rt IJ HD trialysis cath 06/05/12>>   Antibiotics:  cipro 1/7 >>1//11  vanc 1/7 >>1/11  Flagyl 1/7 >>1/11  Ancef 1/11 >>  Discharge Exam: Filed Vitals:   06/12/12 1129 06/12/12 1200 06/12/12 1224 06/12/12 1330  BP: 114/45 123/55 118/52 121/62  Pulse: 62 71 75 81  Temp:   98.2 F (36.8 C) 98.4 F (36.9 C)  TempSrc:   Oral Oral  Resp: 15 19 19 20   Height:      Weight:   141 kg (310 lb 13.6 oz)   SpO2:   92% 94%    General: No distress Cardiovascular: S1, S2 regular rhythm rate. Respiratory: Crackles at the bases.  Discharge Instructions  Discharge Orders    Future Orders Please Complete By Expires   Diet - low sodium heart healthy      Increase activity slowly          Medication List     As of 06/12/2012  3:16 PM    STOP taking these medications         carisoprodol 350 MG tablet   Commonly known as: SOMA      TAKE these medications         albuterol 108 (90 BASE) MCG/ACT inhaler   Commonly known as: PROVENTIL HFA;VENTOLIN HFA   Inhale 2 puffs into the lungs every 6 (six) hours.      ALPRAZolam 1 MG tablet   Commonly known as: XANAX   Anxiety, half tablet twice a day PRN.      epoetin alfa 16109 UNIT/ML injection   Commonly known as: EPOGEN,PROCRIT   Inject 6,500 Units into the skin 3 (three) times a week. Receives at [facility]. Last visit 08/23/11      escitalopram 20 MG tablet   Commonly known as: LEXAPRO   Take 20 mg by mouth daily.      midodrine 10 MG tablet   Commonly known as: PROAMATINE   Take 1 tablet (10 mg total) by mouth 3 (three) times daily with meals.      multivitamin Tabs tablet   Take 1 tablet by mouth daily.      omeprazole 20 MG capsule   Commonly known as: PRILOSEC   Take 20 mg by mouth 2 (two) times daily.       oxyCODONE 5 MG immediate release tablet   Commonly known as: Oxy IR/ROXICODONE   Take 1 tablet (5 mg total) by mouth every 8 (eight) hours as needed.      sevelamer 800 MG tablet   Commonly known as: RENVELA   Take 800-1,600 mg by mouth See admin instructions. Two capsules with each meal, and one capsule with each snack      sulfamethoxazole-trimethoprim 800-160 MG per tablet   Commonly known as: BACTRIM DS,SEPTRA DS   Take 1 tablet by mouth 2 (two) times daily.          The results of significant diagnostics  from this hospitalization (including imaging, microbiology, ancillary and laboratory) are listed below for reference.    Significant Diagnostic Studies: Dg Chest 2 View  06/04/2012  *RADIOLOGY REPORT*  Clinical Data: Rhonchi.  Altered level of consciousness.  CHEST - 2 VIEW  Comparison: 03/28/2012.  Findings: Cardiomegaly.  Bilateral right greater than left perihilar opacities, likely vascular.  Early congestive heart failure.  Bilateral perihilar infiltrates felt less likely. Negative osseous structures.  Worsening aeration compared with priors.  IMPRESSION: Cardiomegaly with probable early CHF.   Original Report Authenticated By: Davonna Belling, M.D.    Ct Head Wo Contrast  06/04/2012  *RADIOLOGY REPORT*  Clinical Data: Severe headache.  Altered mental status.  End-stage renal disease.  Morbid obesity.  CT HEAD WITHOUT CONTRAST  Technique:  Contiguous axial images were obtained from the base of the skull through the vertex without contrast.  Comparison: None.  Findings: The patient had difficulty remaining motionless for the study.  Images are suboptimal.  Small or subtle lesions could be overlooked. There is no evidence for acute infarction, intracranial hemorrhage, mass lesion, hydrocephalus, or extra-axial fluid.  Mild hyperostosis.  Slight atrophy.  No definite white matter disease. Clear sinuses and mastoids.  Negative orbits.  IMPRESSION: Motion degraded exam.  Slight atrophy is  suspected.  No acute intracranial findings.   Original Report Authenticated By: Davonna Belling, M.D.    US Abdomen Complete  06/05/2012  *RADIOLOGY REPORT*  Clinical Data:  Elevated liver function tests.  COMPLETE ABDOMINAL ULTRASOUND  Comparison:  CT abdomen and pelvis 04/05/2012 and 03/28/2012.  Findings:  Gallbladder:  Removed.  Common bile duct:  Measures 0.7 cm.  Liver:  No focal lesion identified.  Within normal limits in parenchymal echogenicity.  IVC:  Appears normal.  Pancreas:  Not well seen but no focal abnormality is identified.  Spleen:  Measures 14.1 cm.  No focal lesion.  Right Kidney:  Removed.  There is a large fluid collection in the right renal fossa measuring 14.7 x 10.2 x 13.5 cm.  The collection is predominantly hypoechoic and correlates with liquefaction of hematoma seen on prior CT scan.  Left Kidney:  Measures 12.3 cm and demonstrates cortical thinning. No stone or hydronephrosis.  Abdominal aorta:  No aneurysm identified.  IMPRESSION:  1.  No focal abnormality or abnormality to explain elevated liver function tests. 2.  Status post cholecystectomy and right nephrectomy. 3.  Large fluid collection in the right renal fossa correlates with liquefaction of hematoma seen on prior CT scan.   Original Report Authenticated By: Holley Dexter, M.D.    Dg Chest Port 1 View  06/07/2012  *RADIOLOGY REPORT*  Clinical Data: Checked ETT  PORTABLE CHEST - 1 VIEW  Comparison: 06/06/2012  Findings: Cardiomegaly with pulmonary vascular congestion and mild interstitial edema.  Bibasilar opacities, likely atelectasis.  No pneumothorax.  Endotracheal tube terminates 3.5 cm above the carina.  Stable right IJ venous catheter.  Enteric tube courses below the diaphragm.  IMPRESSION: Cardiomegaly with mild interstitial edema.  Stable support apparatus as above.   Original Report Authenticated By: Charline Bills, M.D.    Dg Chest Port 1 View  06/06/2012  *RADIOLOGY REPORT*  Clinical Data: Respiratory failure.   PORTABLE CHEST - 1 VIEW  Comparison: 06/05/2012.  Findings: Endotracheal tube is in satisfactory position. Nasogastric tube is followed into the stomach.  Right IJ dialysis catheter tip projects over the SVC.  Heart is enlarged, as before. Suspect pulmonary arterial hypertension.  Diffuse interstitial prominence and indistinctness with bibasilar atelectasis.  Probable left pleural effusion.  IMPRESSION: Congestive heart failure and bibasilar air space disease, stable.   Original Report Authenticated By: Leanna Battles, M.D.    Dg Chest Port 1 View  06/05/2012  *RADIOLOGY REPORT*  Clinical Data: 47 year old male with respiratory difficulty - intubation.  PORTABLE CHEST - 1 VIEW  Comparison: 06/05/2012 and prior chest radiographs  Findings: An endotracheal tube is now identified with tip 1.3 cm above the carina - consider 1 cm retraction. An NG tube is present entering the stomach with tip off the field of view. A right IJ central venous catheter is again noted with tip overlying the mid - lower SVC. Cardiomegaly and increasing bilateral lower lung atelectasis/consolidation noted. There is no evidence of pneumothorax. No other changes noted.  IMPRESSION: Support apparatus as described - consider slight retraction (1 cm) of endotracheal tube.  Increasing bilateral lower lung atelectasis/consolidation.   Original Report Authenticated By: Harmon Pier, M.D.    Dg Chest Port 1 View  06/05/2012  *RADIOLOGY REPORT*  Clinical Data: Central line placement  PORTABLE CHEST - 1 VIEW  Comparison: 06/04/2012  Findings: Stable cardiomegaly and prominent bilateral hila likely related to some degree of pulmonary artery enlargement bilaterally. This is similar to the prior study.  There is a new right central venous line via internal jugular approach.  Central line distally projects to the left of the spine.  The patient is somewhat rotated toward the left.  IMPRESSION: No pneumothorax.  Cannot verify appropriate venous positioning  of central line.  Based on central line contour, arterial positioning is not excluded.  This may be due to patient rotation.   Original Report Authenticated By: Esperanza Heir, M.D.     Microbiology: Recent Results (from the past 240 hour(s))  CULTURE, BLOOD (ROUTINE X 2)     Status: Normal   Collection Time   06/04/12 11:58 PM      Component Value Range Status Comment   Specimen Description BLOOD RIGHT ARM   Final    Special Requests BOTTLES DRAWN AEROBIC AND ANAEROBIC 10CC EACH   Final    Culture  Setup Time 06/05/2012 04:09   Final    Culture NO GROWTH 5 DAYS   Final    Report Status 06/11/2012 FINAL   Final   CULTURE, BLOOD (ROUTINE X 2)     Status: Normal   Collection Time   06/05/12 12:05 AM      Component Value Range Status Comment   Specimen Description BLOOD RIGHT HAND   Final    Special Requests BOTTLES DRAWN AEROBIC AND ANAEROBIC 10CC EACH   Final    Culture  Setup Time 06/05/2012 04:09   Final    Culture NO GROWTH 5 DAYS   Final    Report Status 06/11/2012 FINAL   Final   MRSA PCR SCREENING     Status: Normal   Collection Time   06/05/12  6:53 AM      Component Value Range Status Comment   MRSA by PCR NEGATIVE  NEGATIVE Final   CULTURE, RESPIRATORY     Status: Normal   Collection Time   06/05/12  4:12 PM      Component Value Range Status Comment   Specimen Description TRACHEAL ASPIRATE   Final    Special Requests NONE   Final    Gram Stain     Final    Value: ABUNDANT WBC PRESENT, PREDOMINANTLY PMN     RARE SQUAMOUS EPITHELIAL CELLS PRESENT     FEW  GRAM POSITIVE COCCI     IN PAIRS FEW GRAM POSITIVE RODS     FEW YEAST   Culture     Final    Value: ABUNDANT STAPHYLOCOCCUS AUREUS     Note: RIFAMPIN AND GENTAMICIN SHOULD NOT BE USED AS SINGLE DRUGS FOR TREATMENT OF STAPH INFECTIONS.   Report Status 06/09/2012 FINAL   Final    Organism ID, Bacteria STAPHYLOCOCCUS AUREUS   Final      Labs: Basic Metabolic Panel:  Lab 06/12/12 1610 06/11/12 1517 06/09/12 1339 06/08/12  0645 06/06/12 1626  NA 139 138 135 135 136  K 3.2* 3.9 3.9 3.9 3.7  CL 101 100 97 99 101  CO2 26 29 21 23 21   GLUCOSE 81 161* 111* 100* 124*  BUN 33* 28* 31* 50* 32*  CREATININE 5.16* 4.63* 4.09* 5.04* 4.06*  CALCIUM 8.7 8.7 9.0 8.9 8.8  MG -- -- -- -- --  PHOS 3.7 3.0 3.3 4.7* 2.9   Liver Function Tests:  Lab 06/12/12 0801 06/11/12 1517 06/09/12 1339 06/08/12 0645 06/06/12 1626 06/06/12 0415  AST -- 12 -- 61* -- 416*  ALT -- 49 -- 501* -- 1154*  ALKPHOS -- 82 -- 102 -- 122*  BILITOT -- 0.2* -- 0.3 -- 0.4  PROT -- 6.1 -- 6.6 -- 7.3  ALBUMIN 2.1* 2.1*2.2* 2.4* 2.4* 2.4* --   No results found for this basename: LIPASE:5,AMYLASE:5 in the last 168 hours No results found for this basename: AMMONIA:5 in the last 168 hours CBC:  Lab 06/12/12 0801 06/11/12 1517 06/09/12 1339 06/08/12 0725 06/06/12 0415  WBC 9.0 8.4 13.3* 13.1* 17.6*  NEUTROABS -- -- -- -- --  HGB 8.4* 8.8* 8.5* 8.5* 9.7*  HCT 27.3* 28.6* 27.7* 27.9* 31.0*  MCV 97.5 97.9 98.9 98.6 97.5  PLT 149* 152 157 138* 185   Cardiac Enzymes:  Lab 06/05/12 2124 06/05/12 1648  CKTOTAL -- --  CKMB -- --  CKMBINDEX -- --  TROPONINI 0.91* 1.21*   BNP: BNP (last 3 results) No results found for this basename: PROBNP:3 in the last 8760 hours CBG:  Lab 06/12/12 1348 06/12/12 0735 06/11/12 2345 06/11/12 1707 06/11/12 1130  GLUCAP 81 73 95 115* 100*       Signed:  REGALADO,BELKYS  [facility] 06/12/2012, 3:16 PM

## 2012-06-12 NOTE — Progress Notes (Signed)
Patient ID: [name], male   DOB: 11/30/1965, 47 y.o.   MRN: [id number]   [facility] Progress Note    Subjective:   Reports an uneventful night, denies any further shortness of breath. Assessed by physical therapy/occupational therapy yesterday-follow-up pending today.    Objective:   BP 120/52  Pulse 63  Temp 97.4 F (36.3 C) (Oral)  Resp 19  Ht 6' 0.05" (1.83 m)  Wt 143.7 kg (316 lb 12.8 oz)  BMI 42.91 kg/m2  SpO2 96%  Physical Exam: Gen: Comfortably resting in bed-on hemodialysis CVS: Pulse regular in rate and rhythm, heart sounds S1 and S2 are distant Resp: Coarse breath sounds bilaterally, no rales/rhonchi Abd: Soft, obese, nontender and bowel sounds are normal Ext: One plus lower extremity edema, chronic venous dermopathy  Labs: BMET  Lab 06/12/12 0801 06/11/12 1517 06/09/12 1339 06/08/12 0645 06/06/12 1626 06/06/12 0415 06/05/12 1648  NA 139 138 135 135 136 137 139  K 3.2* 3.9 3.9 3.9 3.7 3.9 3.9  CL 101 100 97 99 101 98 99  CO2 26 29 21 23 21 21 20   GLUCOSE 81 161* 111* 100* 124* 124* 150*  BUN 33* 28* 31* 50* 32* 36* 47*  CREATININE 5.16* 4.63* 4.09* 5.04* 4.06* 4.68* 6.03*  ALB -- -- -- -- -- -- --  CALCIUM 8.7 8.7 9.0 8.9 8.8 9.1 8.5  PHOS 3.7 3.0 3.3 4.7* 2.9 3.7 5.5*   CBC  Lab 06/12/12 0801 06/11/12 1517 06/09/12 1339 06/08/12 0725  WBC 9.0 8.4 13.3* 13.1*  NEUTROABS -- -- -- --  HGB 8.4* 8.8* 8.5* 8.5*  HCT 27.3* 28.6* 27.7* 27.9*  MCV 97.5 97.9 98.9 98.6  PLT 149* 152 157 138*    @IMGRELPRIORS @ Medications:      . albuterol  2 puff Inhalation Q6H  .  ceFAZolin (ANCEF) IV  2 g Intravenous Q T,Th,Sa-HD  .  ceFAZolin (ANCEF) IV  2 g Intravenous Once  . ciprofloxacin  1 drop Left Eye Q2H while awake  . darbepoetin (ARANESP) injection - DIALYSIS  150 mcg Intravenous Q Sat-HD  . escitalopram  20 mg Oral Daily  . feeding supplement  1 Container Oral BID BM  . ferric gluconate (FERRLECIT/NULECIT) IV  250 mg Intravenous Daily   . insulin aspart  0-9 Units Subcutaneous TID WC  . midodrine  10 mg Oral TID WC  . multivitamin  1 tablet Oral QHS  . pantoprazole (PROTONIX) IV  40 mg Intravenous Q24H  . sevelamer  1,600 mg Oral TID WC  . sodium chloride  3 mL Intravenous Q12H     Assessment/ Plan:   1. Hypercarbic respiratory failure with MSSA pneumonia: On Ancef monotherapy for MSSA. Respiratory status appears to be improving status post extubation-awaiting reassessment of oxygen requirements prior to discharge. 2.ESRD; continue on a Tuesday, Thursday, Saturday dialysis schedule per his outpatient prescription-currently on dialysis without any critical intradialytic hypotension. 3. 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 Hypotension: On midodrine for blood pressure support, continue to monitor during dialysis   Zetta Bills, MD 06/12/2012, 10:54 AM

## 2012-06-12 NOTE — Progress Notes (Addendum)
ANTIBIOTIC CONSULT NOTE - INITIAL  Pharmacy Consult for Ancef Indication: MSSA PNA  Allergies  Allergen Reactions  . Piperacillin Sod-Tazobactam So Hives    Patient Measurements: Height: 6' 0.05" (183 cm) Weight: 316 lb 12.8 oz (143.7 kg) IBW/kg (Calculated) : 77.72   Vital Signs: Temp: 97.4 F (36.3 C) (01/14 0523) Temp src: Oral (01/14 0752) BP: 118/49 mmHg (01/14 0859) Pulse Rate: 64  (01/14 0859) Intake/Output from previous day: 01/13 0701 - 01/14 0700 In: 760 [P.O.:750; IV Piggyback:10] Out: -  Intake/Output from this shift:    Labs:  [facility] 06/12/12 0801 06/11/12 1517 06/09/12 1339  WBC 9.0 8.4 13.3*  HGB 8.4* 8.8* 8.5*  PLT 149* 152 157  LABCREA -- -- --  CREATININE 5.16* 4.63* 4.09*   Estimated Creatinine Clearance: 26.9 ml/min (by C-G formula based on Cr of 5.16). No results found for this basename: VANCOTROUGH:2,VANCOPEAK:2,VANCORANDOM:2,GENTTROUGH:2,GENTPEAK:2,GENTRANDOM:2,TOBRATROUGH:2,TOBRAPEAK:2,TOBRARND:2,AMIKACINPEAK:2,AMIKACINTROU:2,AMIKACIN:2, in the last 72 hours   Microbiology: Recent Results (from the past 720 hour(s))  CULTURE, BLOOD (ROUTINE X 2)     Status: Normal   Collection Time   06/04/12 11:58 PM      Component Value Range Status Comment   Specimen Description BLOOD RIGHT ARM   Final    Special Requests BOTTLES DRAWN AEROBIC AND ANAEROBIC 10CC EACH   Final    Culture  Setup Time 06/05/2012 04:09   Final    Culture NO GROWTH 5 DAYS   Final    Report Status 06/11/2012 FINAL   Final   CULTURE, BLOOD (ROUTINE X 2)     Status: Normal   Collection Time   06/05/12 12:05 AM      Component Value Range Status Comment   Specimen Description BLOOD RIGHT HAND   Final    Special Requests BOTTLES DRAWN AEROBIC AND ANAEROBIC 10CC EACH   Final    Culture  Setup Time 06/05/2012 04:09   Final    Culture NO GROWTH 5 DAYS   Final    Report Status 06/11/2012 FINAL   Final   MRSA PCR SCREENING     Status: Normal   Collection Time   06/05/12  6:53 AM     Component Value Range Status Comment   MRSA by PCR NEGATIVE  NEGATIVE Final   CULTURE, RESPIRATORY     Status: Normal   Collection Time   06/05/12  4:12 PM      Component Value Range Status Comment   Specimen Description TRACHEAL ASPIRATE   Final    Special Requests NONE   Final    Gram Stain     Final    Value: ABUNDANT WBC PRESENT, PREDOMINANTLY PMN     RARE SQUAMOUS EPITHELIAL CELLS PRESENT     FEW GRAM POSITIVE COCCI     IN PAIRS FEW GRAM POSITIVE RODS     FEW YEAST   Culture     Final    Value: ABUNDANT STAPHYLOCOCCUS AUREUS     Note: RIFAMPIN AND GENTAMICIN SHOULD NOT BE USED AS SINGLE DRUGS FOR TREATMENT OF STAPH INFECTIONS.   Report Status 06/09/2012 FINAL   Final    Organism ID, Bacteria STAPHYLOCOCCUS AUREUS   Final     Medical History: Past Medical History  Diagnosis Date  . Hypertension   . GERD (gastroesophageal reflux disease)   . DVT (deep venous thrombosis)   . Renal disorder   . Complication of anesthesia     " I am hard  to wake up "  . ESRD on hemodialysis  Dialysis at [facility] in [place], [place]. Started dialysis in 2010. ESRD due to DM/HTN.  . Diabetes mellitus     Medications:  Scheduled:     . albuterol  2 puff Inhalation Q6H  .  ceFAZolin (ANCEF) IV  1 g Intravenous Q24H  . ciprofloxacin  1 drop Left Eye Q2H while awake  . darbepoetin (ARANESP) injection - DIALYSIS  150 mcg Intravenous Q Sat-HD  . escitalopram  20 mg Oral Daily  . feeding supplement  1 Container Oral BID BM  . ferric gluconate (FERRLECIT/NULECIT) IV  250 mg Intravenous Daily  . insulin aspart  0-9 Units Subcutaneous TID WC  . midodrine  10 mg Oral TID WC  . multivitamin  1 tablet Oral QHS  . pantoprazole (PROTONIX) IV  40 mg Intravenous Q24H  . sevelamer  1,600 mg Oral TID WC  . sodium chloride  3 mL Intravenous Q12H  . [DISCONTINUED] feeding supplement (NEPRO CARB STEADY)  1,000 mL Per Tube Q24H  . [DISCONTINUED] feeding supplement  30 mL Per Tube 6 X Daily    . [DISCONTINUED] heparin  5,000 Units Subcutaneous Q8H   Assessment: 47 yo male who presented to ED with impaired hearing and altered mental status. Cultures revealed MSSA PNA, on Ancef Day 4 (total antibiotic day 8). Pt is afebrile, WBC trending down, now 9. Pt has ESRD and is on intermittent HD TTS.  Currently on Ancef 1g q 24 hrs.  vanc 1/7>>1/11 cipro 1/7>>1/11 Flagyl 1/7>>1/11 Ancef 1/11>>  1/6 blood cx>>Negative 1/7 resp cx>>MSSA   Plan:  - Will change Ancef to 2g qHD for ease of dosing. - Consider d/c antibiotics soon? (today completes 8-day course) - Will monitor cx/spec/sens, renal fn and clinical status daily.  Tad Moore, BCPS  Clinical Pharmacist Pager [phone]  06/12/2012 9:22 AM

## 2012-10-22 ENCOUNTER — Emergency Department (HOSPITAL_BASED_OUTPATIENT_CLINIC_OR_DEPARTMENT_OTHER): Payer: Medicare Other

## 2012-10-22 ENCOUNTER — Emergency Department (HOSPITAL_BASED_OUTPATIENT_CLINIC_OR_DEPARTMENT_OTHER)
Admission: EM | Admit: 2012-10-22 | Discharge: 2012-10-22 | Disposition: A | Discharge status: Home / Self Care | Payer: Medicare Other | Attending: Emergency Medicine | Admitting: Emergency Medicine

## 2012-10-22 ENCOUNTER — Encounter (HOSPITAL_BASED_OUTPATIENT_CLINIC_OR_DEPARTMENT_OTHER): Payer: Self-pay | Admitting: *Deleted

## 2012-10-22 DIAGNOSIS — N186 End stage renal disease: Secondary | ICD-10-CM | POA: Insufficient documentation

## 2012-10-22 DIAGNOSIS — Y929 Unspecified place or not applicable: Secondary | ICD-10-CM | POA: Insufficient documentation

## 2012-10-22 DIAGNOSIS — N289 Disorder of kidney and ureter, unspecified: Secondary | ICD-10-CM | POA: Insufficient documentation

## 2012-10-22 DIAGNOSIS — K219 Gastro-esophageal reflux disease without esophagitis: Secondary | ICD-10-CM | POA: Insufficient documentation

## 2012-10-22 DIAGNOSIS — I82409 Acute embolism and thrombosis of unspecified deep veins of unspecified lower extremity: Secondary | ICD-10-CM | POA: Insufficient documentation

## 2012-10-22 DIAGNOSIS — Z992 Dependence on renal dialysis: Secondary | ICD-10-CM | POA: Insufficient documentation

## 2012-10-22 DIAGNOSIS — S93409A Sprain of unspecified ligament of unspecified ankle, initial encounter: Secondary | ICD-10-CM | POA: Insufficient documentation

## 2012-10-22 DIAGNOSIS — S93401A Sprain of unspecified ligament of right ankle, initial encounter: Secondary | ICD-10-CM

## 2012-10-22 DIAGNOSIS — E119 Type 2 diabetes mellitus without complications: Secondary | ICD-10-CM | POA: Insufficient documentation

## 2012-10-22 DIAGNOSIS — X500XXA Overexertion from strenuous movement or load, initial encounter: Secondary | ICD-10-CM | POA: Insufficient documentation

## 2012-10-22 DIAGNOSIS — I120 Hypertensive chronic kidney disease with stage 5 chronic kidney disease or end stage renal disease: Secondary | ICD-10-CM | POA: Insufficient documentation

## 2012-10-22 DIAGNOSIS — Y9389 Activity, other specified: Secondary | ICD-10-CM | POA: Insufficient documentation

## 2012-10-22 DIAGNOSIS — Z79899 Other long term (current) drug therapy: Secondary | ICD-10-CM | POA: Insufficient documentation

## 2012-10-22 DIAGNOSIS — F17200 Nicotine dependence, unspecified, uncomplicated: Secondary | ICD-10-CM | POA: Insufficient documentation

## 2012-10-22 MED ORDER — OXYCODONE-ACETAMINOPHEN 5-325 MG PO TABS
2.0000 | ORAL_TABLET | Freq: Once | ORAL | Status: DC
  Filled 2012-10-22 (×2): qty 2

## 2012-10-22 MED ORDER — HYDROMORPHONE HCL PF 1 MG/ML IJ SOLN
1.0000 mg | Freq: Once | INTRAMUSCULAR | Status: AC
  Administered 2012-10-22: 1 mg via INTRAMUSCULAR
  Filled 2012-10-22: qty 1

## 2012-10-22 NOTE — ED Notes (Signed)
Was getting in the pool and felt his right ankle pop.

## 2012-10-22 NOTE — ED Provider Notes (Signed)
History  This chart was scribed for Oddie Bottger B. Bernette Mayers, MD by Ardelia Mems, ED Scribe. This patient was seen in room MH09/MH09 and the patient's care was started at 5:07 PM.   CSN: [id number]  Arrival date & time 10/22/12  1640     Chief Complaint  Patient presents with  . Ankle Pain    The history is provided by the patient. No language interpreter was used.    HPI Comments: [name] is a 47 y.o. male with a h/o DM who presents to the Emergency Department complaining of constant, moderate right ankle pain. Pt states that he was getting off of his motorized wheelchair, taking his feet off of a platform on the wheelchair and into the pool earlier today when he felt his right ankle pop. Pt states that his left ankle is normally painful and immobile. Pt does not ambulate well. Pt states that he has sprained his ankle several times and that his current ankle pain is different and worse. Pt states that he takes 30 mg of oxycodone at home for pain associated with diabetic neuropathy. He requests medication for pain relief today. Pt denies alcohol use and is a current every day smoker.   Past Medical History  Diagnosis Date  . Hypertension   . GERD (gastroesophageal reflux disease)   . DVT (deep venous thrombosis)   . Renal disorder   . Complication of anesthesia     " I am hard  to wake up "  . ESRD on hemodialysis     Dialysis at [facility] in [place], [place]. Started dialysis in 2010. ESRD due to DM/HTN.  . Diabetes mellitus     Past Surgical History  Procedure Laterality Date  . Gastric sleeve    . Kidney stones    . Dialysis port    . Compression hip screw  08/26/2011    Procedure: COMPRESSION HIP;  Surgeon: Velna Ochs, MD;  Location: [facility] OR;  Service: Orthopedics;  Laterality: Left;  DYNAMIC HIP SCREW COMPRESSION  . Ivc filter    . Cholecystostomy tube      No family history on file.  History  Substance Use Topics  . Smoking status: Current Every Day  Smoker    Types: Cigarettes  . Smokeless tobacco: Never Used  . Alcohol Use: No      Review of Systems A complete 10 system review of systems was obtained and all systems are negative except as noted in the HPI and PMH.   Allergies  Piperacillin sod-tazobactam so  Home Medications   Current Outpatient Rx  Name  Route  Sig  Dispense  Refill  . albuterol (PROVENTIL HFA;VENTOLIN HFA) 108 (90 BASE) MCG/ACT inhaler   Inhalation   Inhale 2 puffs into the lungs every 6 (six) hours.   1 Inhaler   0   . ALPRAZolam (XANAX) 1 MG tablet      Anxiety, half tablet twice a day PRN.   30 tablet   0   . epoetin alfa (EPOGEN,PROCRIT) 78295 UNIT/ML injection   Subcutaneous   Inject 6,500 Units into the skin 3 (three) times a week. Receives at [facility]. Last visit 08/23/11         . escitalopram (LEXAPRO) 20 MG tablet   Oral   Take 20 mg by mouth daily.         . midodrine (PROAMATINE) 10 MG tablet   Oral   Take 1 tablet (10 mg total) by mouth 3 (three) times  daily with meals.   90 tablet   0   . multivitamin (RENA-VIT) TABS tablet   Oral   Take 1 tablet by mouth daily.         Marland Kitchen omeprazole (PRILOSEC) 20 MG capsule   Oral   Take 20 mg by mouth 2 (two) times daily.         Marland Kitchen oxyCODONE (OXY IR/ROXICODONE) 5 MG immediate release tablet   Oral   Take 1 tablet (5 mg total) by mouth every 8 (eight) hours as needed.   10 tablet   0   . sevelamer (RENVELA) 800 MG tablet   Oral   Take 800-1,600 mg by mouth See admin instructions. Two capsules with each meal, and one capsule with each snack         . sulfamethoxazole-trimethoprim (SEPTRA DS) 800-160 MG per tablet   Oral   Take 1 tablet by mouth 2 (two) times daily.   8 tablet   0     Triage Vitals: BP 127/50  Pulse 99  Temp(Src) 98.3 F (36.8 C) (Oral)  Resp 18  Ht 6\' 1"  (1.854 m)  Wt 317 lb (143.79 kg)  BMI 41.83 kg/m2  SpO2 98%  Physical Exam  Constitutional: He is oriented to person, place, and  time. He appears well-developed and well-nourished.  HENT:  Head: Normocephalic and atraumatic.  Neck: Neck supple.  Pulmonary/Chest: Effort normal.  Musculoskeletal:  Soft tissue tenderness right ankle. Decreased ROM due to pain. Neurovascularly intact.  Neurological: He is alert and oriented to person, place, and time. No cranial nerve deficit.  Psychiatric: He has a normal mood and affect. His behavior is normal.    ED Course  Procedures (including critical care time)  DIAGNOSTIC STUDIES: Oxygen Saturation is 98% on RA, normal by my interpretation.    COORDINATION OF CARE: 5:08 PM- Pt advised of plan for treatment and pt agrees.   Medications  HYDROmorphone (DILAUDID) injection 1 mg (not administered)     Labs Reviewed - No data to display Dg Ankle Complete Right  10/22/2012   *RADIOLOGY REPORT*  Clinical Data: Right ankle pain/injury  RIGHT ANKLE - COMPLETE 3+ VIEW  Comparison: None.  Findings: No fracture or dislocation is seen.  The ankle mortise is intact.  Mild degenerative changes of the tibiotalar joint.  Diffuse osteopenia, which limits sensitivity.  Visualized soft tissues are grossly unremarkable.  IMPRESSION: No fracture or dislocation is seen.  Mild degenerative changes with osteopenia.   Original Report Authenticated By: Charline Bills, M.D.     1. Ankle sprain, right, initial encounter       MDM  Xray neg. Pt offered PO pain medications here (he takes Oxycodone 30mg  at home) but he is insistent on 'a shot'. Placed in ASO. Advised RICE therapy at home. He has motorized wheelchair. Advised to avoid weight bearing until pain resolved. PCP follow up.         I personally performed the services described in this documentation, which was scribed in my presence. The recorded information has been reviewed and is accurate.     Murlene Revell B. Bernette Mayers, MD 10/22/12 1744

## 2012-11-15 ENCOUNTER — Emergency Department (HOSPITAL_BASED_OUTPATIENT_CLINIC_OR_DEPARTMENT_OTHER)
Admission: EM | Admit: 2012-11-15 | Discharge: 2012-11-15 | Disposition: A | Discharge status: Home / Self Care | Payer: Medicare Other | Attending: Emergency Medicine | Admitting: Emergency Medicine

## 2012-11-15 ENCOUNTER — Emergency Department (HOSPITAL_BASED_OUTPATIENT_CLINIC_OR_DEPARTMENT_OTHER): Payer: Medicare Other

## 2012-11-15 ENCOUNTER — Encounter (HOSPITAL_BASED_OUTPATIENT_CLINIC_OR_DEPARTMENT_OTHER): Payer: Self-pay | Admitting: *Deleted

## 2012-11-15 DIAGNOSIS — Z8679 Personal history of other diseases of the circulatory system: Secondary | ICD-10-CM | POA: Insufficient documentation

## 2012-11-15 DIAGNOSIS — S199XXA Unspecified injury of neck, initial encounter: Secondary | ICD-10-CM | POA: Insufficient documentation

## 2012-11-15 DIAGNOSIS — Z79899 Other long term (current) drug therapy: Secondary | ICD-10-CM | POA: Insufficient documentation

## 2012-11-15 DIAGNOSIS — K219 Gastro-esophageal reflux disease without esophagitis: Secondary | ICD-10-CM | POA: Insufficient documentation

## 2012-11-15 DIAGNOSIS — W07XXXA Fall from chair, initial encounter: Secondary | ICD-10-CM | POA: Insufficient documentation

## 2012-11-15 DIAGNOSIS — G8929 Other chronic pain: Secondary | ICD-10-CM | POA: Insufficient documentation

## 2012-11-15 DIAGNOSIS — W19XXXA Unspecified fall, initial encounter: Secondary | ICD-10-CM

## 2012-11-15 DIAGNOSIS — R05 Cough: Secondary | ICD-10-CM | POA: Insufficient documentation

## 2012-11-15 DIAGNOSIS — F17200 Nicotine dependence, unspecified, uncomplicated: Secondary | ICD-10-CM | POA: Insufficient documentation

## 2012-11-15 DIAGNOSIS — R5381 Other malaise: Secondary | ICD-10-CM | POA: Insufficient documentation

## 2012-11-15 DIAGNOSIS — S298XXA Other specified injuries of thorax, initial encounter: Secondary | ICD-10-CM | POA: Insufficient documentation

## 2012-11-15 DIAGNOSIS — E119 Type 2 diabetes mellitus without complications: Secondary | ICD-10-CM | POA: Insufficient documentation

## 2012-11-15 DIAGNOSIS — S0993XA Unspecified injury of face, initial encounter: Secondary | ICD-10-CM | POA: Insufficient documentation

## 2012-11-15 DIAGNOSIS — IMO0002 Reserved for concepts with insufficient information to code with codable children: Secondary | ICD-10-CM | POA: Insufficient documentation

## 2012-11-15 DIAGNOSIS — R059 Cough, unspecified: Secondary | ICD-10-CM | POA: Insufficient documentation

## 2012-11-15 DIAGNOSIS — Z992 Dependence on renal dialysis: Secondary | ICD-10-CM | POA: Insufficient documentation

## 2012-11-15 DIAGNOSIS — M5416 Radiculopathy, lumbar region: Secondary | ICD-10-CM

## 2012-11-15 DIAGNOSIS — N186 End stage renal disease: Secondary | ICD-10-CM | POA: Insufficient documentation

## 2012-11-15 DIAGNOSIS — Y9389 Activity, other specified: Secondary | ICD-10-CM | POA: Insufficient documentation

## 2012-11-15 DIAGNOSIS — Y92009 Unspecified place in unspecified non-institutional (private) residence as the place of occurrence of the external cause: Secondary | ICD-10-CM

## 2012-11-15 DIAGNOSIS — Z87448 Personal history of other diseases of urinary system: Secondary | ICD-10-CM | POA: Insufficient documentation

## 2012-11-15 DIAGNOSIS — R062 Wheezing: Secondary | ICD-10-CM | POA: Insufficient documentation

## 2012-11-15 DIAGNOSIS — I129 Hypertensive chronic kidney disease with stage 1 through stage 4 chronic kidney disease, or unspecified chronic kidney disease: Secondary | ICD-10-CM | POA: Insufficient documentation

## 2012-11-15 MED ORDER — DIAZEPAM 5 MG PO TABS
5.0000 mg | ORAL_TABLET | Freq: Once | ORAL | Status: AC
  Administered 2012-11-15: 5 mg via ORAL
  Filled 2012-11-15: qty 1

## 2012-11-15 MED ORDER — PREDNISONE 20 MG PO TABS: 40.0000 mg | ORAL_TABLET | Freq: Every day | ORAL | Status: DC

## 2012-11-15 MED ORDER — DIAZEPAM 10 MG PO TABS: 10.0000 mg | ORAL_TABLET | Freq: Three times a day (TID) | ORAL | Status: DC | PRN

## 2012-11-15 MED ORDER — OXYCODONE HCL 15 MG PO TABS: 15.0000 mg | ORAL_TABLET | Freq: Four times a day (QID) | ORAL | Status: DC | PRN

## 2012-11-15 MED ORDER — HYDROMORPHONE HCL PF 2 MG/ML IJ SOLN
4.0000 mg | Freq: Once | INTRAMUSCULAR | Status: AC
  Administered 2012-11-15: 4 mg via INTRAMUSCULAR
  Filled 2012-11-15: qty 2

## 2012-11-15 NOTE — ED Notes (Signed)
Fall. 3 days ago he rolled out of the bed. Hx of wheelchair dependent. Pain in his lower back and right ribs.

## 2012-11-15 NOTE — ED Notes (Signed)
Patient transported to X-ray 

## 2012-11-15 NOTE — ED Notes (Signed)
EDP made aware of pt pain level.
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
## 2013-03-23 ENCOUNTER — Emergency Department (HOSPITAL_COMMUNITY): Payer: Medicare Other

## 2013-03-23 ENCOUNTER — Inpatient Hospital Stay (HOSPITAL_COMMUNITY): Payer: Medicare Other

## 2013-03-23 ENCOUNTER — Encounter (HOSPITAL_COMMUNITY): Payer: Self-pay | Admitting: Emergency Medicine

## 2013-03-23 ENCOUNTER — Inpatient Hospital Stay (HOSPITAL_COMMUNITY)
Admission: EM | Admit: 2013-03-23 | Discharge: 2013-04-02 | DRG: 871 | Disposition: A | Discharge status: Home / Self Care | Payer: Medicare Other | Attending: Internal Medicine | Admitting: Internal Medicine

## 2013-03-23 DIAGNOSIS — N186 End stage renal disease: Secondary | ICD-10-CM | POA: Diagnosis present

## 2013-03-23 DIAGNOSIS — R578 Other shock: Secondary | ICD-10-CM

## 2013-03-23 DIAGNOSIS — Z9884 Bariatric surgery status: Secondary | ICD-10-CM

## 2013-03-23 DIAGNOSIS — R1310 Dysphagia, unspecified: Secondary | ICD-10-CM | POA: Diagnosis present

## 2013-03-23 DIAGNOSIS — G8929 Other chronic pain: Secondary | ICD-10-CM | POA: Diagnosis present

## 2013-03-23 DIAGNOSIS — J189 Pneumonia, unspecified organism: Secondary | ICD-10-CM | POA: Diagnosis present

## 2013-03-23 DIAGNOSIS — Z86718 Personal history of other venous thrombosis and embolism: Secondary | ICD-10-CM

## 2013-03-23 DIAGNOSIS — E1129 Type 2 diabetes mellitus with other diabetic kidney complication: Secondary | ICD-10-CM | POA: Diagnosis present

## 2013-03-23 DIAGNOSIS — A419 Sepsis, unspecified organism: Principal | ICD-10-CM | POA: Diagnosis present

## 2013-03-23 DIAGNOSIS — F22 Delusional disorders: Secondary | ICD-10-CM | POA: Diagnosis not present

## 2013-03-23 DIAGNOSIS — R58 Hemorrhage, not elsewhere classified: Secondary | ICD-10-CM

## 2013-03-23 DIAGNOSIS — E662 Morbid (severe) obesity with alveolar hypoventilation: Secondary | ICD-10-CM | POA: Diagnosis present

## 2013-03-23 DIAGNOSIS — D631 Anemia in chronic kidney disease: Secondary | ICD-10-CM | POA: Diagnosis present

## 2013-03-23 DIAGNOSIS — E871 Hypo-osmolality and hyponatremia: Secondary | ICD-10-CM | POA: Diagnosis present

## 2013-03-23 DIAGNOSIS — E2740 Unspecified adrenocortical insufficiency: Secondary | ICD-10-CM

## 2013-03-23 DIAGNOSIS — E872 Acidosis, unspecified: Secondary | ICD-10-CM | POA: Diagnosis present

## 2013-03-23 DIAGNOSIS — G253 Myoclonus: Secondary | ICD-10-CM | POA: Diagnosis present

## 2013-03-23 DIAGNOSIS — J9600 Acute respiratory failure, unspecified whether with hypoxia or hypercapnia: Secondary | ICD-10-CM | POA: Diagnosis present

## 2013-03-23 DIAGNOSIS — D689 Coagulation defect, unspecified: Secondary | ICD-10-CM

## 2013-03-23 DIAGNOSIS — Z79899 Other long term (current) drug therapy: Secondary | ICD-10-CM

## 2013-03-23 DIAGNOSIS — G4733 Obstructive sleep apnea (adult) (pediatric): Secondary | ICD-10-CM | POA: Diagnosis present

## 2013-03-23 DIAGNOSIS — F17200 Nicotine dependence, unspecified, uncomplicated: Secondary | ICD-10-CM | POA: Diagnosis present

## 2013-03-23 DIAGNOSIS — M216X9 Other acquired deformities of unspecified foot: Secondary | ICD-10-CM | POA: Diagnosis present

## 2013-03-23 DIAGNOSIS — D649 Anemia, unspecified: Secondary | ICD-10-CM

## 2013-03-23 DIAGNOSIS — Z880 Allergy status to penicillin: Secondary | ICD-10-CM

## 2013-03-23 DIAGNOSIS — Z6841 Body Mass Index (BMI) 40.0 and over, adult: Secondary | ICD-10-CM

## 2013-03-23 DIAGNOSIS — I959 Hypotension, unspecified: Secondary | ICD-10-CM

## 2013-03-23 DIAGNOSIS — Z992 Dependence on renal dialysis: Secondary | ICD-10-CM

## 2013-03-23 DIAGNOSIS — K219 Gastro-esophageal reflux disease without esophagitis: Secondary | ICD-10-CM | POA: Diagnosis present

## 2013-03-23 DIAGNOSIS — I120 Hypertensive chronic kidney disease with stage 5 chronic kidney disease or end stage renal disease: Secondary | ICD-10-CM | POA: Diagnosis present

## 2013-03-23 DIAGNOSIS — G9341 Metabolic encephalopathy: Secondary | ICD-10-CM | POA: Diagnosis present

## 2013-03-23 DIAGNOSIS — F29 Unspecified psychosis not due to a substance or known physiological condition: Secondary | ICD-10-CM | POA: Diagnosis not present

## 2013-03-23 DIAGNOSIS — G9340 Encephalopathy, unspecified: Secondary | ICD-10-CM

## 2013-03-23 DIAGNOSIS — R29898 Other symptoms and signs involving the musculoskeletal system: Secondary | ICD-10-CM | POA: Diagnosis present

## 2013-03-23 HISTORY — DX: Dependence on renal dialysis: Z99.2

## 2013-03-23 HISTORY — DX: End stage renal disease: N18.6

## 2013-03-23 LAB — POCT I-STAT 3, ART BLOOD GAS (G3+)
Acid-base deficit: 3 mmol/L — ABNORMAL HIGH (ref 0.0–2.0)
Bicarbonate: 22 mEq/L (ref 20.0–24.0)
O2 Saturation: 92 %
O2 Saturation: 96 %
Patient temperature: 98.7
Patient temperature: 98.6
TCO2: 25 mmol/L (ref 0–100)
TCO2: 23 mmol/L (ref 0–100)
pH, Arterial: 7.351 (ref 7.350–7.450)
pO2, Arterial: 86 mmHg (ref 80.0–100.0)

## 2013-03-23 LAB — CBC WITH DIFFERENTIAL/PLATELET
Basophils Absolute: 0 10*3/uL (ref 0.0–0.1)
Basophils Relative: 0 % (ref 0–1)
HCT: 38.6 % — ABNORMAL LOW (ref 39.0–52.0)
Hemoglobin: 12.5 g/dL — ABNORMAL LOW (ref 13.0–17.0)
Lymphocytes Relative: 2 % — ABNORMAL LOW (ref 12–46)
Monocytes Absolute: 0.8 10*3/uL (ref 0.1–1.0)
Monocytes Relative: 4 % (ref 3–12)
Neutro Abs: 20.2 10*3/uL — ABNORMAL HIGH (ref 1.7–7.7)
Neutrophils Relative %: 94 % — ABNORMAL HIGH (ref 43–77)
RBC: 3.85 MIL/uL — ABNORMAL LOW (ref 4.22–5.81)
WBC: 21.5 10*3/uL — ABNORMAL HIGH (ref 4.0–10.5)

## 2013-03-23 LAB — COMPREHENSIVE METABOLIC PANEL
AST: 18 U/L (ref 0–37)
Albumin: 3.5 g/dL (ref 3.5–5.2)
Alkaline Phosphatase: 279 U/L — ABNORMAL HIGH (ref 39–117)
BUN: 21 mg/dL (ref 6–23)
CO2: 25 mEq/L (ref 19–32)
Chloride: 92 mEq/L — ABNORMAL LOW (ref 96–112)
Creatinine, Ser: 2.94 mg/dL — ABNORMAL HIGH (ref 0.50–1.35)
GFR calc non Af Amer: 24 mL/min — ABNORMAL LOW (ref >90)
Potassium: 4.8 mEq/L (ref 3.5–5.1)
Total Bilirubin: 0.5 mg/dL (ref 0.3–1.2)

## 2013-03-23 LAB — CG4 I-STAT (LACTIC ACID): Lactic Acid, Venous: 2.78 mmol/L — ABNORMAL HIGH (ref 0.5–2.2)

## 2013-03-23 MED ORDER — PANTOPRAZOLE SODIUM 40 MG IV SOLR
40.0000 mg | INTRAVENOUS | Status: DC
  Filled 2013-03-23: qty 40

## 2013-03-23 MED ORDER — OSELTAMIVIR PHOSPHATE 75 MG PO CAPS
75.0000 mg | ORAL_CAPSULE | ORAL | Status: DC
  Filled 2013-03-23: qty 1

## 2013-03-23 MED ORDER — ALBUTEROL SULFATE (5 MG/ML) 0.5% IN NEBU
2.5000 mg | INHALATION_SOLUTION | RESPIRATORY_TRACT | Status: DC | PRN
  Administered 2013-03-30: 2.5 mg via RESPIRATORY_TRACT
  Filled 2013-03-23: qty 0.5

## 2013-03-23 MED ORDER — LEVOFLOXACIN IN D5W 750 MG/150ML IV SOLN
750.0000 mg | Freq: Once | INTRAVENOUS | Status: AC
  Administered 2013-03-23: 750 mg via INTRAVENOUS
  Filled 2013-03-23: qty 150

## 2013-03-23 MED ORDER — CHLORHEXIDINE GLUCONATE 0.12 % MT SOLN
15.0000 mL | Freq: Two times a day (BID) | OROMUCOSAL | Status: DC
  Administered 2013-03-23 – 2013-04-02 (×17): 15 mL via OROMUCOSAL
  Filled 2013-03-23 (×22): qty 15

## 2013-03-23 MED ORDER — VANCOMYCIN HCL 10 G IV SOLR
2500.0000 mg | Freq: Once | INTRAVENOUS | Status: AC
  Administered 2013-03-23: 2500 mg via INTRAVENOUS
  Filled 2013-03-23: qty 2500

## 2013-03-23 MED ORDER — DEXTROSE 5 % IV SOLN
2.0000 g | Freq: Once | INTRAVENOUS | Status: AC
  Administered 2013-03-23: 2 g via INTRAVENOUS
  Filled 2013-03-23: qty 2

## 2013-03-23 MED ORDER — SODIUM CHLORIDE 0.9 % IV SOLN
INTRAVENOUS | Status: DC
  Administered 2013-03-23: 19:00:00 via INTRAVENOUS

## 2013-03-23 MED ORDER — DEXTROSE 5 % IV SOLN
2.0000 ug/min | INTRAVENOUS | Status: DC
  Administered 2013-03-23 – 2013-03-24 (×2): 15 ug/min via INTRAVENOUS
  Filled 2013-03-23 (×2): qty 4

## 2013-03-23 MED ORDER — BIOTENE DRY MOUTH MT LIQD
15.0000 mL | Freq: Two times a day (BID) | OROMUCOSAL | Status: DC
  Administered 2013-03-24 – 2013-04-01 (×17): 15 mL via OROMUCOSAL

## 2013-03-23 MED ORDER — SODIUM CHLORIDE 0.9 % IV BOLUS (SEPSIS)
500.0000 mL | Freq: Once | INTRAVENOUS | Status: AC
  Administered 2013-03-23: 500 mL via INTRAVENOUS

## 2013-03-23 MED ORDER — VANCOMYCIN HCL 1000 MG IV SOLR
1500.0000 mg | INTRAVENOUS | Status: DC
  Filled 2013-03-23: qty 1500

## 2013-03-23 MED ORDER — HEPARIN SODIUM (PORCINE) 5000 UNIT/ML IJ SOLN
5000.0000 [IU] | Freq: Three times a day (TID) | INTRAMUSCULAR | Status: DC
  Administered 2013-03-23 – 2013-04-02 (×27): 5000 [IU] via SUBCUTANEOUS
  Filled 2013-03-23 (×32): qty 1

## 2013-03-23 MED ORDER — LEVOFLOXACIN IN D5W 500 MG/100ML IV SOLN
500.0000 mg | INTRAVENOUS | Status: AC
  Administered 2013-03-25 – 2013-03-29 (×3): 500 mg via INTRAVENOUS
  Filled 2013-03-23 (×3): qty 100

## 2013-03-23 MED ORDER — INSULIN ASPART 100 UNIT/ML ~~LOC~~ SOLN
0.0000 [IU] | SUBCUTANEOUS | Status: DC
  Administered 2013-03-23 – 2013-03-24 (×2): 2 [IU] via SUBCUTANEOUS
  Administered 2013-03-24 (×2): 1 [IU] via SUBCUTANEOUS
  Administered 2013-03-24: 3 [IU] via SUBCUTANEOUS
  Administered 2013-03-25: 1 [IU] via SUBCUTANEOUS
  Administered 2013-03-26: 2 [IU] via SUBCUTANEOUS
  Administered 2013-03-30: 1 [IU] via SUBCUTANEOUS
  Administered 2013-03-31: 2 [IU] via SUBCUTANEOUS
  Administered 2013-03-31: 1 [IU] via SUBCUTANEOUS
  Administered 2013-03-31 (×3): 2 [IU] via SUBCUTANEOUS
  Administered 2013-04-01 – 2013-04-02 (×5): 1 [IU] via SUBCUTANEOUS

## 2013-03-23 MED ORDER — OSELTAMIVIR PHOSPHATE 75 MG PO CAPS: 75.0000 mg | ORAL_CAPSULE | ORAL | Status: DC

## 2013-03-23 NOTE — Progress Notes (Signed)
Placed pt. On bipap as per MD order. Pt. Is tolerating well at this time. 

## 2013-03-23 NOTE — ED Notes (Signed)
Pt from dialysis.  EMS called out for AMS.  EMS reports upon arrival pt's O2 sats in the 80's.  Non rebreather applied and O2 sats up to 96%.  EMS reports that after O2 sats rose, pt became alert and oriented x 4.  Pt presents with cough which he reports is productive with yellowish color.

## 2013-03-23 NOTE — Progress Notes (Signed)
eLink Physician-Brief Progress Note Patient Name: [name] DOB: Oct 25, 1965 MRN: [id number]  Date of Service  03/23/2013   HPI/Events of Note   Assessed upon arrival to ICU Hypotensive / hypoxic Not on BiPAP   eICU Interventions   BiPAP restarted (settings corrected) Hydrocortisone started (h/o adrenal insufficiency) RT to place A-line Bedside MD to evaluate for possible intubation / CVL    Intervention Category Major Interventions: Respiratory failure - evaluation and management;Sepsis - evaluation and management  [name] 03/23/2013, 7:43 PM

## 2013-03-23 NOTE — ED Provider Notes (Signed)
CSN: [id number]     Arrival date & time 03/23/13  1305 History   First MD Initiated Contact with Patient 03/23/13 1316     Chief Complaint  Patient presents with  . Altered Mental Status  . Cough   (Consider location/radiation/quality/duration/timing/severity/associated sxs/prior Treatment) HPI Patient presents with concern of altered mental status.  History of present illness is per EMS, the patient's wife. Patient awakens with direct questioning, but otherwise is somnolent. Patient's wife states that over the past days he has had decreased interactivity, decreased appetite, increased somnolence. There is concurrent increased respiratory rate, with no new cough, no new fever. Patient has been going to dialysis more frequently, including today, yesterday, the day prior, with no appreciable change in his condition. Patient notes that although the HD staff is watching his fluid balance carefully, he continues to have increasing edema.  Past Medical History  Diagnosis Date  . Hypertension   . GERD (gastroesophageal reflux disease)   . DVT (deep venous thrombosis)   . Renal disorder   . Complication of anesthesia     " I am hard  to wake up "  . ESRD on hemodialysis     Dialysis at [facility] in [place], [place]. Started dialysis in 2010. ESRD due to DM/HTN.  . Diabetes mellitus    Past Surgical History  Procedure Laterality Date  . Gastric sleeve    . Kidney stones    . Dialysis port    . Compression hip screw  08/26/2011    Procedure: COMPRESSION HIP;  Surgeon: Velna Ochs, MD;  Location: [facility] OR;  Service: Orthopedics;  Laterality: Left;  DYNAMIC HIP SCREW COMPRESSION  . Ivc filter    . Cholecystostomy tube     No family history on file. History  Substance Use Topics  . Smoking status: Current Every Day Smoker    Types: Cigarettes  . Smokeless tobacco: Never Used  . Alcohol Use: No    Review of Systems  Constitutional:       Per HPI, otherwise negative   HENT:       Per HPI, otherwise negative  Respiratory:       Per HPI, otherwise negative  Cardiovascular:       Per HPI, otherwise negative  Gastrointestinal: Negative for vomiting.  Endocrine:       Negative aside from HPI  Genitourinary:       Neg aside from HPI   Musculoskeletal:       Per HPI, otherwise negative  Skin: Negative.   Neurological: Negative for syncope.    Allergies  Piperacillin sod-tazobactam so  Home Medications   Current Outpatient Rx  Name  Route  Sig  Dispense  Refill  . albuterol (PROVENTIL HFA;VENTOLIN HFA) 108 (90 BASE) MCG/ACT inhaler   Inhalation   Inhale 2 puffs into the lungs every 6 (six) hours as needed for wheezing.         Marland Kitchen epoetin alfa (EPOGEN,PROCRIT) 40981 UNIT/ML injection   Subcutaneous   Inject 6,500 Units into the skin 3 (three) times a week. Tuesday, Thursday, Saturday Dialysis schedule         . escitalopram (LEXAPRO) 20 MG tablet   Oral   Take 20 mg by mouth daily.         . midodrine (PROAMATINE) 10 MG tablet   Oral   Take 10 mg by mouth 3 (three) times daily.         . multivitamin (RENA-VIT) TABS tablet   Oral  Take 1 tablet by mouth daily.         Marland Kitchen omeprazole (PRILOSEC) 20 MG capsule   Oral   Take 20 mg by mouth 2 (two) times daily.         Marland Kitchen oxycodone (ROXICODONE) 30 MG immediate release tablet   Oral   Take 30 mg by mouth every 6 (six) hours as needed for pain.          BP 151/106  Pulse 127  Temp(Src) 98 F (36.7 C) (Oral)  Resp 26  SpO2 93% Physical Exam  Constitutional: He appears distressed.  Obese male, somnolent.  HENT:  Head: Normocephalic and atraumatic.  Eyes: Conjunctivae are normal. Right eye exhibits no discharge. Left eye exhibits no discharge.  Neck: No tracheal deviation present.  Cardiovascular: Regular rhythm and intact distal pulses.  Tachycardia present.   Pulmonary/Chest: Accessory muscle usage present. No stridor. Tachypnea noted. He is in respiratory distress.  He has decreased breath sounds.  Abdominal: Soft. There is no tenderness.  Musculoskeletal:  Diffuse peripheral edema, symmetric  Neurological: No cranial nerve deficit. He exhibits normal muscle tone. Coordination normal.  Somnolent, but when awakened, the patient is oriented x3.  Skin: He is diaphoretic.    ED Course  Procedures (including critical care time) Labs Review Labs Reviewed  CBC WITH DIFFERENTIAL - Abnormal; Notable for the following:    WBC 21.5 (*)    RBC 3.85 (*)    Hemoglobin 12.5 (*)    HCT 38.6 (*)    MCV 100.3 (*)    RDW 15.8 (*)    Neutrophils Relative % 94 (*)    Neutro Abs 20.2 (*)    Lymphocytes Relative 2 (*)    Lymphs Abs 0.5 (*)    All other components within normal limits  COMPREHENSIVE METABOLIC PANEL - Abnormal; Notable for the following:    Sodium 133 (*)    Chloride 92 (*)    Glucose, Bld 162 (*)    Creatinine, Ser 2.94 (*)    Total Protein 9.1 (*)    Alkaline Phosphatase 279 (*)    GFR calc non Af Amer 24 (*)    GFR calc Af Amer 28 (*)    All other components within normal limits  PRO B NATRIURETIC PEPTIDE - Abnormal; Notable for the following:    Pro B Natriuretic peptide (BNP) 7725.0 (*)    All other components within normal limits  CG4 I-STAT (LACTIC ACID) - Abnormal; Notable for the following:    Lactic Acid, Venous 2.78 (*)    All other components within normal limits   Imaging Review Dg Chest Port 1 View  03/23/2013   CLINICAL DATA:  Wheezing  EXAM: PORTABLE CHEST - 1 VIEW  COMPARISON:  November 15, 2012  FINDINGS: There is bibasilar consolidation. Lungs are otherwise clear. Heart is enlarged with normal pulmonary vascularity, stable. No adenopathy.  IMPRESSION: Bibasilar consolidation. Persistent cardiomegaly.   Electronically Signed   By: Bretta Bang M.D.   On: 03/23/2013 14:05    I reviewed the x-ray, and agree with abnormal interpretation  EKG Interpretation     Ventricular Rate:  121 PR Interval:  43 QRS  Duration: 87 QT Interval:  456 QTC Calculation: 647 R Axis:   151 Text Interpretation:  Sinus tachycardia Ventricular premature complex Right axis deviation Low voltage, precordial leads Abnormal R-wave progression, late transition Prolonged QT interval Abnormal ekg           During initial exam the patient has  hypotension, with blood pressure less than 70 systolic, map in the low 50s. Pulse oximetry is 99% with supplemental oxygen.  Patient is hypoxic en route here. Her rate is 120s, regular, abnormal  After the initial evaluation I reviewed the patient's chart.  With this notable hypotension, tachycardia, there was immediate suspicion for infection.  Given the patient's history of heart failure, fluid resuscitation will be difficult.  He was treated apparently with antibiotics for a likely pneumonia.  Update: On repeat exam the patient remained tachycardic, hypotensive.  Blood work is demonstrated lactic acidosis, leukocytosis.   Update: With x-ray suggestion of pneumonia, lactic acidosis, hypotension, the patient required ICU admission.  I discussed the patient's case with our critical care team.  MDM  No diagnosis found. This patient with multiple medical problems, including end-stage renal disease, congestive heart failure now presents dyspneic, tachypneic, tachycardic, diaphoretic, very unwell-appearing.  In addition to the patient is hypotensive.  With his complicated medical condition, fluid resuscitation is difficult, but the patient was apparently provided antibiotics.  Evaluation in the emergency department demonstrated presence of pneumonia, and with notably abnormal labs required ICU admission.  CRITICAL CARE Performed by: Gerhard Munch Total critical care time: 40 Critical care time was exclusive of separately billable procedures and treating other patients. Critical care was necessary to treat or prevent imminent or life-threatening deterioration. Critical care was  time spent personally by me on the following activities: development of treatment plan with patient and/or surrogate as well as nursing, discussions with consultants, evaluation of patient's response to treatment, examination of patient, obtaining history from patient or surrogate, ordering and performing treatments and interventions, ordering and review of laboratory studies, ordering and review of radiographic studies, pulse oximetry and re-evaluation of patient's condition.     Gerhard Munch, MD 03/23/13 [phone]

## 2013-03-23 NOTE — H&P (Signed)
PULMONARY  / CRITICAL CARE MEDICINE  Name: [name] MRN: [id number] DOB: May 23, 1966    ADMISSION DATE:  03/23/2013  REFERRING MD :  Gerhard Munch  CHIEF COMPLAINT:  Cant breath  BRIEF PATIENT DESCRIPTION:  47 yo male from dialysis center with dyspnea, hypoxia, and cough with yellow sputum.  Found to have PNA.  PCCM asked to admit to ICU.  SIGNIFICANT EVENTS: 10/25 Admit to ICU, renal consulted  STUDIES:    LINES / TUBES: PIV  CULTURES: Blood 10/25 >> Sputum 10/25 >> Respiratory viral panel 10/25 >>   ANTIBIOTICS: Azactam 10/25 >> Vancomycin 10/25 >> Levaquin 10/25 >> Tamiflu 10/25 >>   HISTORY OF PRESENT ILLNESS:   47 yo male with hx of ESRD went to HD earlier today.  He has been getting more sleepy, and having jerky movements per his wife.  He also has cough with yellow sputum.  They are not aware of fever or chills.  He completed his HD, but was still was feeling short of breath.  They are not sure how much fluid was removed with HD.  He was sent to ED.  He was noted to be hypoxic and placed on supplemental oxygen.  His chest xray showed basilar infiltrates with possible pneumonia.  He has hx of intolerance to zosyn >> chest pain, anxious.  He was given vancomycin, aztreonam.  He did get his flu shot this year.  He gets his dialysis with doctors in [place], but prefers to get hospital care at [facility].  PAST MEDICAL HISTORY :  Past Medical History  Diagnosis Date  . Hypertension   . GERD (gastroesophageal reflux disease)   . DVT (deep venous thrombosis)   . Renal disorder   . Complication of anesthesia     " I am hard  to wake up "  . ESRD on hemodialysis     Dialysis at [facility] in [place], [place]. Started dialysis in 2010. ESRD due to DM/HTN.  . Diabetes mellitus    Past Surgical History  Procedure Laterality Date  . Gastric sleeve    . Kidney stones    . Dialysis port    . Compression hip screw  08/26/2011    Procedure: COMPRESSION HIP;   Surgeon: Velna Ochs, MD;  Location: [facility] OR;  Service: Orthopedics;  Laterality: Left;  DYNAMIC HIP SCREW COMPRESSION  . Ivc filter    . Cholecystostomy tube     Prior to Admission medications   Medication Sig Start Date End Date Taking? Authorizing Provider  albuterol (PROVENTIL HFA;VENTOLIN HFA) 108 (90 BASE) MCG/ACT inhaler Inhale 2 puffs into the lungs every 6 (six) hours as needed for wheezing.   Yes Historical Provider, MD  epoetin alfa (EPOGEN,PROCRIT) 40981 UNIT/ML injection Inject 6,500 Units into the skin 3 (three) times a week. Tuesday, Thursday, Saturday Dialysis schedule   Yes Historical Provider, MD  escitalopram (LEXAPRO) 20 MG tablet Take 20 mg by mouth daily.   Yes Historical Provider, MD  midodrine (PROAMATINE) 10 MG tablet Take 10 mg by mouth 3 (three) times daily.   Yes Historical Provider, MD  multivitamin (RENA-VIT) TABS tablet Take 1 tablet by mouth daily.   Yes Historical Provider, MD  omeprazole (PRILOSEC) 20 MG capsule Take 20 mg by mouth 2 (two) times daily.   Yes Historical Provider, MD  oxycodone (ROXICODONE) 30 MG immediate release tablet Take 30 mg by mouth every 6 (six) hours as needed for pain.   Yes Historical Provider, MD   Allergies  Allergen Reactions  . Piperacillin Sod-Tazobactam So Hives    FAMILY HISTORY:  No family history on file. SOCIAL HISTORY:  reports that he has been smoking Cigarettes.  He has been smoking about 0.00 packs per day. He has never used smokeless tobacco. He reports that he does not drink alcohol or use illicit drugs.  REVIEW OF SYSTEMS:   Denies nausea, vomiting, diarrhea.  Denies recent sick exposures.    SUBJECTIVE:   VITAL SIGNS: Temp:  [98 F (36.7 C)] 98 F (36.7 C) (10/25 1307) Pulse Rate:  [127] 127 (10/25 1502) Resp:  [26-28] 26 (10/25 1502) BP: (93-151)/(55-106) 151/106 mmHg (10/25 1502) SpO2:  [79 %-96 %] 93 % (10/25 1502) NRB  INTAKE / OUTPUT: Intake/Output   None     PHYSICAL  EXAMINATION: General: Grabbing forehead Neuro:  Sleepy, wakes up easily, intermittent jerking movements, follows simple commands, moves all extremities HEENT:  Pupils reactive, no sinus tenderness Cardiovascular:  Regular, tachycardic, no murmur Lungs:  Decreased breath sounds, scattered rhonchi, no wheeze Abdomen:  Obese, soft, non tender Musculoskeletal:  1+ non pitting edema Skin:  No rashes  LABS:  CBC Recent Labs     03/23/13  1353  WBC  21.5*  HGB  12.5*  HCT  38.6*  PLT  230   Coag's No results found for this basename: APTT, INR,  in the last 72 hours  BMET Recent Labs     03/23/13  1353  NA  133*  K  4.8  CL  92*  CO2  25  BUN  21  CREATININE  2.94*  GLUCOSE  162*   Electrolytes Recent Labs     03/23/13  1353  CALCIUM  9.0   Sepsis Markers No results found for this basename: LACTICACIDVEN, PROCALCITON, O2SATVEN,  in the last 72 hours  ABG No results found for this basename: PHART, PCO2ART, PO2ART,  in the last 72 hours  Liver Enzymes Recent Labs     03/23/13  1353  AST  18  ALT  16  ALKPHOS  279*  BILITOT  0.5  ALBUMIN  3.5   Cardiac Enzymes Recent Labs     03/23/13  1353  PROBNP  7725.0*   Glucose No results found for this basename: GLUCAP,  in the last 72 hours  Imaging Dg Chest Port 1 View  03/23/2013   CLINICAL DATA:  Wheezing  EXAM: PORTABLE CHEST - 1 VIEW  COMPARISON:  November 15, 2012  FINDINGS: There is bibasilar consolidation. Lungs are otherwise clear. Heart is enlarged with normal pulmonary vascularity, stable. No adenopathy.  IMPRESSION: Bibasilar consolidation. Persistent cardiomegaly.   Electronically Signed   By: Bretta Bang M.D.   On: 03/23/2013 14:05    ASSESSMENT / PLAN:  PULMONARY A: Acute respiratory failure from volume overload and probable PNA. P:   -oxygen to keep SpO2 > 90% -BiPAP >> hopefully can avoid need for intubation -f/u CXR -f/u ABG -prn albuterol  CARDIOVASCULAR A:  SIRS/sepsis. Hx of  HTN. P:  -monitor hemodynamics -check lactic acid  RENAL A:   ESRD. P:   -consult renal -f/u renal panel  GASTROINTESTINAL A:   Morbid obesity >> wife reports he has lost almost 500 lbs since gastric sleeve in 2010. P:   -NPO until mental and respiratory status improved -protonix for SUP  HEMATOLOGIC A:   Leukocytosis. P:  -f/u CBC -SQ heparin for DVT prevention  INFECTIOUS A:   Possible PNA >> bacterial vs viral. P:   -Day one  vancomycin, aztreonam, levaquin, tamiflu  ENDOCRINE A:   Hyperglycemia.   P:   -SSI  NEUROLOGIC A:   Acute encephalopathy 2nd to respiratory failure. P:   -monitor mental status -hold outpt lexapro, roxicodone  Update wife at bedside.  CC time 40 minutes.  Coralyn Helling, MD [facility] Pulmonary/Critical Care 03/23/2013, 4:24 PM Pager:  [phone] After 3pm call: [phone]

## 2013-03-23 NOTE — Procedures (Signed)
Central Venous Catheter Insertion Procedure Note Montague Corella [id number] [birth year]  Procedure: Insertion of Central Venous Catheter Indications: Assessment of intravascular volume, Drug and/or fluid administration and Frequent blood sampling  Procedure Details Consent: Risks of procedure as well as the alternatives and risks of each were explained to the (patient/caregiver).  Consent for procedure obtained. Time Out: Verified patient identification, verified procedure, site/side was marked, verified correct patient position, special equipment/implants available, medications/allergies/relevent history reviewed, required imaging and test results available.  Performed  Maximum sterile technique was used including antiseptics, cap, gloves, gown, hand hygiene, mask and sheet. Skin prep: Chlorhexidine; local anesthetic administered A antimicrobial bonded/coated triple lumen catheter was placed in the right internal jugular vein using the Seldinger technique.  Evaluation Blood flow good Complications: No apparent complications Patient did tolerate procedure well. Chest X-ray ordered to verify placement.  CXR: pending  Ultrasound was used for this procedure.   Sandi Carne K. 03/23/2013, 9:21 PM

## 2013-03-23 NOTE — Progress Notes (Signed)
RT and charge RT attempted arterial line but was unsuccessful. MD was present and assisted with placing line successfully. No issues at this time.

## 2013-03-23 NOTE — Progress Notes (Addendum)
ANTIBIOTIC CONSULT NOTE - INITIAL  Pharmacy Consult for Vancomycin Indication: pneumonia  Allergies  Allergen Reactions  . Piperacillin Sod-Tazobactam So Hives    Patient Measurements:   Wt Readings from Last 3 Encounters:  11/15/12 317 lb (143.79 kg)  10/22/12 317 lb (143.79 kg)  06/12/12 310 lb 13.6 oz (141 kg)     Vital Signs: Temp: 98 F (36.7 C) (10/25 1307) Temp src: Oral (10/25 1307) BP: 93/55 mmHg (10/25 1307) Intake/Output from previous day:   Intake/Output from this shift:    Labs:  Recent Labs  03/23/13 1353  WBC 21.5*  HGB 12.5*  PLT 230   The CrCl is unknown because both a height and weight (above a minimum accepted value) are required for this calculation. No results found for this basename: VANCOTROUGH, VANCOPEAK, VANCORANDOM, GENTTROUGH, GENTPEAK, GENTRANDOM, TOBRATROUGH, TOBRAPEAK, TOBRARND, AMIKACINPEAK, AMIKACINTROU, AMIKACIN,  in the last 72 hours   Microbiology: No results found for this or any previous visit (from the past 720 hour(s)).  Medical History: Past Medical History  Diagnosis Date  . Hypertension   . GERD (gastroesophageal reflux disease)   . DVT (deep venous thrombosis)   . Renal disorder   . Complication of anesthesia     " I am hard  to wake up "  . ESRD on hemodialysis     Dialysis at [facility] in [place], [place]. Started dialysis in 2010. ESRD due to DM/HTN.  . Diabetes mellitus     Medications:  No antibiotics listed on his HD records that were sent with him today  Assessment: 47 year old male admitted with AMS and hypoxia from his HD session.  Orders to begin Vancomycin and give Aztreonam x 1 dose.  I do not see where he has received any Vancomycin this admission, or at his HD session.  Goal of Therapy:  preHD Vancomycin level 15-25 mcg/ml   Plan:  Vancomycin 2500mg  IV x 1 loading dose Vancomycin 1500mg  IV after each HD session Monitor for HD tolerance Follow available micro data  Estella Husk, Pharm.D., BCPS, AAHIVP Clinical Pharmacist Phone: [phone] or [phone] 03/23/2013, 2:45 PM    Addendum: Pharmacy also consulted to start Levaquin for PNA and will adjust Tamiflu for ESRD on HD TTS (already had HD session today).   Plan: 1. Levaquin 750mg  IV x 1, then 500mg  IV q48h 2. Adjust Tamiflu to 75mg  after each HD session x 5 days - first dose today 3. Follow-up renal plans, cultures, clinical course and adjust as indicated.  Wilfred Lacy, PharmD Clinical Pharmacist [phone] 03/23/2013, 3:51 PM

## 2013-03-24 ENCOUNTER — Inpatient Hospital Stay (HOSPITAL_COMMUNITY): Payer: Medicare Other

## 2013-03-24 ENCOUNTER — Encounter (HOSPITAL_COMMUNITY): Payer: Self-pay | Admitting: Nephrology

## 2013-03-24 DIAGNOSIS — A419 Sepsis, unspecified organism: Secondary | ICD-10-CM

## 2013-03-24 DIAGNOSIS — E2749 Other adrenocortical insufficiency: Secondary | ICD-10-CM

## 2013-03-24 LAB — BLOOD GAS, ARTERIAL
Acid-base deficit: 3.4 mmol/L — ABNORMAL HIGH (ref 0.0–2.0)
RATE: 10 resp/min
TCO2: 23.7 mmol/L (ref 0–100)
pCO2 arterial: 48.4 mmHg — ABNORMAL HIGH (ref 35.0–45.0)
pH, Arterial: 7.284 — ABNORMAL LOW (ref 7.350–7.450)
pO2, Arterial: 150 mmHg — ABNORMAL HIGH (ref 80.0–100.0)

## 2013-03-24 LAB — GLUCOSE, CAPILLARY
Glucose-Capillary: 226 mg/dL — ABNORMAL HIGH (ref 70–99)
Glucose-Capillary: 187 mg/dL — ABNORMAL HIGH (ref 70–99)
Glucose-Capillary: 98 mg/dL (ref 70–99)
Glucose-Capillary: 142 mg/dL — ABNORMAL HIGH (ref 70–99)
Glucose-Capillary: 115 mg/dL — ABNORMAL HIGH (ref 70–99)

## 2013-03-24 LAB — RENAL FUNCTION PANEL
CO2: 22 mEq/L (ref 19–32)
Chloride: 92 mEq/L — ABNORMAL LOW (ref 96–112)
GFR calc Af Amer: 19 mL/min — ABNORMAL LOW (ref >90)
GFR calc non Af Amer: 17 mL/min — ABNORMAL LOW (ref >90)
Glucose, Bld: 180 mg/dL — ABNORMAL HIGH (ref 70–99)
Potassium: 5 mEq/L (ref 3.5–5.1)
Sodium: 133 mEq/L — ABNORMAL LOW (ref 135–145)

## 2013-03-24 LAB — CBC
MCH: 31.3 pg (ref 26.0–34.0)
MCV: 96.6 fL (ref 78.0–100.0)
Platelets: 250 10*3/uL (ref 150–400)
RDW: 15.5 % (ref 11.5–15.5)

## 2013-03-24 LAB — INFLUENZA PANEL BY PCR (TYPE A & B): H1N1 flu by pcr: NOT DETECTED

## 2013-03-24 MED ORDER — PANTOPRAZOLE SODIUM 40 MG PO TBEC
40.0000 mg | DELAYED_RELEASE_TABLET | Freq: Every day | ORAL | Status: DC
  Administered 2013-03-24 – 2013-03-26 (×3): 40 mg via ORAL
  Filled 2013-03-24 (×3): qty 1

## 2013-03-24 MED ORDER — MIDODRINE HCL 5 MG PO TABS
10.0000 mg | ORAL_TABLET | Freq: Three times a day (TID) | ORAL | Status: DC
  Administered 2013-03-24 – 2013-04-02 (×22): 10 mg via ORAL
  Filled 2013-03-24 (×30): qty 2

## 2013-03-24 MED ORDER — DEXTROSE 5 % IV SOLN
1.0000 g | INTRAVENOUS | Status: DC
  Filled 2013-03-24: qty 1

## 2013-03-24 MED ORDER — DEXTROSE 5 % IV SOLN
500.0000 mg | Freq: Two times a day (BID) | INTRAVENOUS | Status: DC
  Administered 2013-03-24 – 2013-03-26 (×5): 500 mg via INTRAVENOUS
  Filled 2013-03-24 (×7): qty 0.5

## 2013-03-24 MED ORDER — CALCIUM ACETATE 667 MG PO CAPS
2668.0000 mg | ORAL_CAPSULE | Freq: Three times a day (TID) | ORAL | Status: DC
  Administered 2013-03-24 – 2013-04-02 (×17): 2668 mg via ORAL
  Filled 2013-03-24 (×30): qty 4

## 2013-03-24 MED ORDER — NOREPINEPHRINE BITARTRATE 1 MG/ML IJ SOLN
2.0000 ug/min | INTRAVENOUS | Status: DC
  Administered 2013-03-24: 12 ug/min via INTRAVENOUS
  Filled 2013-03-24: qty 16

## 2013-03-24 NOTE — Progress Notes (Addendum)
PULMONARY  / CRITICAL CARE MEDICINE  Name: [name] MRN: [id number] DOB: [birth year]    ADMISSION DATE:  03/23/2013  REFERRING MD :  Gerhard Munch  CHIEF COMPLAINT:  Cant breath  BRIEF PATIENT DESCRIPTION:  47 y/o male from dialysis center with dyspnea, hypoxia, and cough with yellow sputum.  Found to have PNA.  PCCM asked to admit to ICU.  SIGNIFICANT EVENTS: 10/25 - Admit to ICU, renal consulted 10/26 - levo gtt, bipap  STUDIES:    LINES / TUBES: Rt IJ CVL 10/26 >> Aline 10/26 >>  CULTURES: Blood 10/25 >> Sputum 10/25 >> Influenza 10/25 >> negative   ANTIBIOTICS: Azactam 10/25 >> Vancomycin 10/25 >> Levaquin 10/25 >> Tamiflu 10/25 >> 10/26   SUBJECTIVE: RN reports arterial BP in mid 80's systolic on 12 mcg of levo.  Dampened waveform from aline.  No acute distress.  Remains on BiPap 18/8 >> off in AM.  VITAL SIGNS: Temp:  [97.7 F (36.5 C)-98.2 F (36.8 C)] 97.8 F (36.6 C) (10/26 0752) Pulse Rate:  [93-129] 93 (10/26 0800) Resp:  [13-31] 19 (10/26 0800) BP: (48-151)/(18-115) 131/71 mmHg (10/26 0800) SpO2:  [79 %-100 %] 100 % (10/26 0800) Arterial Line BP: (75-274)/(46-204) 150/63 mmHg (10/26 0800) FiO2 (%):  [60 %-100 %] 60 % (10/26 0430) Weight:  [325 lb 13.4 oz (147.8 kg)] 325 lb 13.4 oz (147.8 kg) (10/26 0500) VM  INTAKE / OUTPUT: Intake/Output     10/25 0701 - 10/26 0700 10/26 0701 - 10/27 0700   I.V. (mL/kg) 723.6 (4.9)    Total Intake(mL/kg) 723.6 (4.9)    Net +723.6            PHYSICAL EXAMINATION: General: morbidly obese male lying in bed, NAD Neuro:  Awake, appropriate HEENT:  Pupils reactive, no sinus tenderness Cardiovascular:  Regular, tachycardic, no murmur Lungs:  Decreased breath sounds, scattered rhonchi, no wheeze Abdomen:  Obese, soft, non tender Musculoskeletal:  1+ non pitting edema Skin:  No rashes  LABS:  CBC Recent Labs     03/23/13  1353  03/24/13  0500  WBC  21.5*  36.3*  HGB  12.5*  11.0*  HCT  38.6*   34.0*  PLT  230  250   BMET Recent Labs     03/23/13  1353  03/24/13  0500  NA  133*  133*  K  4.8  5.0  CL  92*  92*  CO2  25  22  BUN  21  41*  CREATININE  2.94*  4.04*  GLUCOSE  162*  180*   Electrolytes Recent Labs     03/23/13  1353  03/24/13  0500  CALCIUM  9.0  9.6  PHOS   --   5.2*   ABG Recent Labs     03/23/13  1630  03/23/13  2051  03/24/13  0407  PHART  7.398  7.351  7.284*  PCO2ART  38.4  39.8  48.4*  PO2ART  65.0*  86.0  150.0*    Liver Enzymes Recent Labs     03/23/13  1353  03/24/13  0500  AST  18   --   ALT  16   --   ALKPHOS  279*   --   BILITOT  0.5   --   ALBUMIN  3.5  3.0*   Cardiac Enzymes Recent Labs     03/23/13  1353  PROBNP  7725.0*   Glucose Recent Labs     03/23/13  1901  03/24/13  0022  03/24/13  0352  03/24/13  0713  GLUCAP  175*  226*  187*  150*    Imaging Dg Chest Port 1 View  03/24/2013   CLINICAL DATA:  Pulmonary infiltrates  EXAM: PORTABLE CHEST - 1 VIEW  COMPARISON:  March 23, 2013  FINDINGS: Central catheter tip is at the cavoatrial junction. No pneumothorax. There is patchy consolidation in both lung bases. There is underlying emphysema. Heart is enlarged with pulmonary venous hypertension.  IMPRESSION: The appearance is consistent with a degree of congestive heart failure superimposed on emphysema. No new opacity seen. Central catheter tip at cavoatrial junction, without pneumothorax. There is no appreciable change overall compared to 1 day prior.   Electronically Signed   By: Bretta Bang M.D.   On: 03/24/2013 07:35   Dg Chest Port 1 View  03/23/2013   CLINICAL DATA:  Central line placement.  EXAM: PORTABLE CHEST - 1 VIEW  COMPARISON:  03/23/2013.  FINDINGS: There is a new right IJ central line. This crosses the midline but the position is probably within the superior vena cava. There is some leftward mediastinal shift and consolidation at the right lung base and in the retrocardiac region.  There is  no pneumothorax. Monitoring leads project over the chest. Notably, of the central line position follows previous Vas-Cath position 06/06/2012.  IMPRESSION: Uncomplicated right central line placement which crosses the midline, similar to prior vascular catheter position. The tip is in the lower SVC near the cavoatrial junction.   Electronically Signed   By: Andreas Newport M.D.   On: 03/23/2013 21:45   Dg Chest Port 1 View  03/23/2013   CLINICAL DATA:  Wheezing  EXAM: PORTABLE CHEST - 1 VIEW  COMPARISON:  November 15, 2012  FINDINGS: There is bibasilar consolidation. Lungs are otherwise clear. Heart is enlarged with normal pulmonary vascularity, stable. No adenopathy.  IMPRESSION: Bibasilar consolidation. Persistent cardiomegaly.   Electronically Signed   By: Bretta Bang M.D.   On: 03/23/2013 14:05    ASSESSMENT / PLAN:  PULMONARY A: Acute respiratory failure from volume overload and probable PNA. Respiratory Acidosis - hypercarbia P:   -oxygen to keep SpO2 > 90% -BiPAP prn -f/u CXR -prn albuterol  CARDIOVASCULAR A:  SIRS/sepsis - lactic acid clearing Hx of hypotension on midodrine as outpt P:  -monitor hemodynamics -levo for SBP of 90 (pt reports this as normal bp) -check cortisol -resume midodrine 10/26  RENAL A:   ESRD. Hyponatremia P:   -Nephrology Consult, appreciate input -f/u renal panel  GASTROINTESTINAL A:   Morbid obesity >> wife reports he has lost almost 500 lbs since gastric sleeve in 2010. P:   -renal diet -protonix for SUP  HEMATOLOGIC A:   Leukocytosis. P:  -f/u CBC -SQ heparin for DVT prevention  INFECTIOUS A:   Possible PNA >> bacterial vs viral. P:   -D3/x - vancomycin, aztreonam, levaquin -flu swab negative >> d/c tamiflu and droplet isolation 10/26  ENDOCRINE A:   Hyperglycemia.   P:   -SSI  NEUROLOGIC A:   Acute encephalopathy 2nd to respiratory failure. P:   -monitor mental status -hold outpt lexapro, roxicodone   Canary Brim, NP-C [facility] Pgr: [phone] or [phone]  Reviewed above, examined pt, and documentation changes made as needed.  Respiratory and mental status better.  Still needing pressors >> hopefully transition off once he gets restarted on midodrine.  Appreciate help from nephrology.  CC time 35 minutes.  Coralyn Helling, MD [facility] 03/24/2013, 11:39 AM Pager:  [phone] After 3pm call: [phone]

## 2013-03-24 NOTE — Progress Notes (Signed)
Levophed re-started.

## 2013-03-24 NOTE — Progress Notes (Signed)
Pt titrated from 100%NRB to 50%VM. Pt tolerating well at this time. RT will continue to monitor.

## 2013-03-24 NOTE — Consult Note (Signed)
Renal Service Consult Note [facility]  [name] 03/24/2013 [name] Requesting Physician: Dr Marchelle Gearing  Reason for Consult: ESRD pt with pna HPI: The patient is a 47 y.o. year-old with hx of esrd, obesity, dvt, gib when on coumadin (off now) and dm, hx of gastric sleeve lost 450 lbs (800 > 350) and has levelled off; admitted with sob , cough, hypoxemia and infiltrate right lower lobe, wbc is 26k.pt denies any chest pain or leg swelling, has not missed hd, gets hd in [place] w [facility]   ros  no skin rash or jt pain  no ha or confusion  no abd pain, n/v/Robles  Past Medical History  Past Medical History  Diagnosis Date  . Hypertension   . GERD (gastroesophageal reflux disease)   . DVT (deep venous thrombosis)   . Renal disorder   . Complication of anesthesia     " I am hard  to wake up "  . ESRD on hemodialysis     Dialysis at [facility] in [place], [place]. Started dialysis in 2010. ESRD due to DM/HTN.  . Diabetes mellitus    Past Surgical History  Past Surgical History  Procedure Laterality Date  . Gastric sleeve    . Kidney stones    . Dialysis port    . Compression hip screw  08/26/2011    Procedure: COMPRESSION HIP;  Surgeon: Velna Ochs, MD;  Location: [facility] OR;  Service: Orthopedics;  Laterality: Left;  DYNAMIC HIP SCREW COMPRESSION  . Ivc filter    . Cholecystostomy tube     Family History No family history on file. Social History  reports that he has been smoking Cigarettes.  He has been smoking about 0.00 packs per day. He has never used smokeless tobacco. He reports that he does not drink alcohol or use illicit drugs. Allergies  Allergies  Allergen Reactions  . Piperacillin Sod-Tazobactam So Hives   Home medications Prior to Admission medications   Medication Sig Start Date End Date Taking? Authorizing Provider  albuterol (PROVENTIL HFA;VENTOLIN HFA) 108 (90 BASE) MCG/ACT inhaler Inhale 2 puffs into the lungs  every 6 (six) hours as needed for wheezing.   Yes Historical Provider, MD  epoetin alfa (EPOGEN,PROCRIT) 40981 UNIT/ML injection Inject 6,500 Units into the skin 3 (three) times a week. Tuesday, Thursday, Saturday Dialysis schedule   Yes Historical Provider, MD  escitalopram (LEXAPRO) 20 MG tablet Take 20 mg by mouth daily.   Yes Historical Provider, MD  midodrine (PROAMATINE) 10 MG tablet Take 10 mg by mouth 3 (three) times daily.   Yes Historical Provider, MD  multivitamin (RENA-VIT) TABS tablet Take 1 tablet by mouth daily.   Yes Historical Provider, MD  omeprazole (PRILOSEC) 20 MG capsule Take 20 mg by mouth 2 (two) times daily.   Yes Historical Provider, MD  oxycodone (ROXICODONE) 30 MG immediate release tablet Take 30 mg by mouth every 6 (six) hours as needed for pain.   Yes Historical Provider, MD   Liver Function Tests  Recent Labs Lab 03/23/13 1353 03/24/13 0500  AST 18  --   ALT 16  --   ALKPHOS 279*  --   BILITOT 0.5  --   PROT 9.1*  --   ALBUMIN 3.5 3.0*   No results found for this basename: LIPASE, AMYLASE,  in the last 168 hours CBC  Recent Labs Lab 03/23/13 1353 03/24/13 0500  WBC 21.5* 36.3*  NEUTROABS 20.2*  --   HGB 12.5* 11.0*  HCT 38.6* 34.0*  MCV 100.3* 96.6  PLT 230 250   Basic Metabolic Panel  Recent Labs Lab 03/23/13 1353 03/24/13 0500  NA 133* 133*  K 4.8 5.0  CL 92* 92*  CO2 25 22  GLUCOSE 162* 180*  BUN 21 41*  CREATININE 2.94* 4.04*  CALCIUM 9.0 9.6  PHOS  --  5.2*     exam:  blood pressure 78/40, pulse 99, temperature 97.8 F (36.6 C), temperature source Oral, resp.  rate 15, weight 147.8 kg (325 lb 13.4 oz), SpO2 100.00%.  physical exam  @V @  gen: obese, alert, no distress  skin: no rash, cyanosis  heent: eomi, sclera anicteric, throat clear  neck: no jvd, neck veins flat  chest: clear bilat, no rales  cor: regular, distant hs, no obvious murmur, pedal pulses intact  abdomen: soft, obese, nontender, well healed scars   extremities: no leg or ue edema, no joint effusion, no gangrene/ulcers  neuro: alert, ox3, nf  access: patent left fa avf  outpatient hd: tts in [place] w [facility]  dry wt per pt is 322 lbs   4.5hrs   +heparin  hectorol     epo      venofer   assessment: 1 ^wbc / pna right lower lobe- abx per primary 2 esrd cont hd tts 3 anemia of ckd- hgb 11, get records in am 4 metabolic bone disease- ca and phos in range, cont phoslo 4ac, get records in am 5 chronichypotension/volume- looks euvolemic, just above dry wt 6 morbid obesity, s/p gastric sleeve   plan- will follow, plan next hd on tues, get records in am from [place] hd  Delano Metz md pager [phone]    cell [phone] @TD @, @NOW @

## 2013-03-24 NOTE — Progress Notes (Signed)
Pt was on BIPAP already, RT just changed machine out

## 2013-03-25 ENCOUNTER — Encounter (HOSPITAL_COMMUNITY): Payer: Self-pay

## 2013-03-25 ENCOUNTER — Inpatient Hospital Stay (HOSPITAL_COMMUNITY): Payer: Medicare Other

## 2013-03-25 DIAGNOSIS — D649 Anemia, unspecified: Secondary | ICD-10-CM

## 2013-03-25 LAB — BASIC METABOLIC PANEL
BUN: 61 mg/dL — ABNORMAL HIGH (ref 6–23)
Calcium: 9.2 mg/dL (ref 8.4–10.5)
Chloride: 92 mEq/L — ABNORMAL LOW (ref 96–112)
Creatinine, Ser: 5.16 mg/dL — ABNORMAL HIGH (ref 0.50–1.35)
GFR calc non Af Amer: 12 mL/min — ABNORMAL LOW (ref >90)
Glucose, Bld: 92 mg/dL (ref 70–99)
Sodium: 132 mEq/L — ABNORMAL LOW (ref 135–145)

## 2013-03-25 LAB — CBC
MCH: 31.1 pg (ref 26.0–34.0)
MCHC: 32.6 g/dL (ref 30.0–36.0)
Platelets: 195 10*3/uL (ref 150–400)
RBC: 2.99 MIL/uL — ABNORMAL LOW (ref 4.22–5.81)
RDW: 15.4 % (ref 11.5–15.5)

## 2013-03-25 LAB — GLUCOSE, CAPILLARY: Glucose-Capillary: 113 mg/dL — ABNORMAL HIGH (ref 70–99)

## 2013-03-25 MED ORDER — FENTANYL CITRATE 0.05 MG/ML IJ SOLN
50.0000 ug | INTRAMUSCULAR | Status: DC | PRN
  Administered 2013-03-25 – 2013-04-02 (×11): 50 ug via INTRAVENOUS
  Filled 2013-03-25 (×9): qty 2

## 2013-03-25 MED ORDER — ONDANSETRON HCL 4 MG/2ML IJ SOLN
4.0000 mg | Freq: Four times a day (QID) | INTRAMUSCULAR | Status: DC | PRN
  Administered 2013-03-25: 4 mg via INTRAMUSCULAR
  Filled 2013-03-25: qty 2

## 2013-03-25 MED ORDER — ACETAMINOPHEN 325 MG PO TABS: 650.0000 mg | ORAL_TABLET | Freq: Four times a day (QID) | ORAL | Status: DC | PRN

## 2013-03-25 MED ORDER — OXYCODONE HCL 5 MG PO TABS
5.0000 mg | ORAL_TABLET | Freq: Four times a day (QID) | ORAL | Status: DC | PRN
  Administered 2013-03-30 – 2013-04-02 (×5): 5 mg via ORAL
  Filled 2013-03-25 (×5): qty 1

## 2013-03-25 NOTE — Progress Notes (Signed)
PULMONARY  / CRITICAL CARE MEDICINE  Name: [name] MRN: [id number] DOB: 1966-04-16    ADMISSION DATE:  03/23/2013  REFERRING MD :  Gerhard Munch  CHIEF COMPLAINT:  Cant breath  BRIEF PATIENT DESCRIPTION:  47 y/o male from dialysis center with dyspnea, hypoxia, and cough with yellow sputum.  Found to have PNA.  PCCM asked to admit to ICU. He has ESRD and on daily midodrine  SIGNIFICANT EVENTS: 10/25 - Admit to ICU, renal consulted 10/26 - levo gtt, bipap  STUDIES:    LINES / TUBES: Rt IJ CVL 10/26 >> Aline 10/26 >>  CULTURES: Blood 10/25 >> not collected Sputum 10/25 >> not collected Influenza 10/25 >> negative  MRSA PCR 10/25>> negative  ANTIBIOTICS: Azactam 10/25 >> Vancomycin 10/25 >> Levaquin 10/25 >> Tamiflu 10/25 >> 10/26  SUBJECTIVE: Doing well on venti-mask, Levo nearly weaned.   VITAL SIGNS: Temp:  [97.7 F (36.5 C)-98 F (36.7 C)] 97.7 F (36.5 C) (10/27 0801) Pulse Rate:  [83-103] 91 (10/27 0645) Resp:  [13-22] 16 (10/27 0645) BP: (78-164)/(40-135) 112/53 mmHg (10/27 0600) SpO2:  [90 %-100 %] 98 % (10/27 0645) Arterial Line BP: (75-147)/(38-65) 92/60 mmHg (10/27 0645) FiO2 (%):  [50 %] 50 % (10/26 1300) VM  INTAKE / OUTPUT: Intake/Output     10/26 0701 - 10/27 0700 10/27 0701 - 10/28 0700   P.O. 500    I.V. (mL/kg) 534 (3.6)    IV Piggyback 100    Total Intake(mL/kg) 1134 (7.7)    Net +1134            PHYSICAL EXAMINATION: General: morbidly obese male lying in bed, NAD Neuro:  Awake, A &O X 3, no movement of L foot, otherwise non-focal exam HEENT:  Pupils reactive, no sinus tenderness Cardiovascular:  Regular, tachycardic, no murmur Lungs: Scattered rhonchi, decreased breath sounds,  no wheeze, mild increase WOB Abdomen:  Obese, soft, non tender Musculoskeletal:  trace edema Skin:  No rashes  LABS: PULMONARY  Recent Labs Lab 03/23/13 1630 03/23/13 2051 03/24/13 0407  PHART 7.398 7.351 7.284*  PCO2ART 38.4 39.8 48.4*   PO2ART 65.0* 86.0 150.0*  HCO3 23.7 22.0 22.2  TCO2 25 23 23.7  O2SAT 92.0 96.0 97.6    CBC  Recent Labs Lab 03/23/13 1353 03/24/13 0500 03/25/13 0400  HGB 12.5* 11.0* 9.3*  HCT 38.6* 34.0* 28.5*  WBC 21.5* 36.3* 20.0*  PLT 230 250 195    COAGULATION No results found for this basename: INR,  in the last 168 hours  CARDIAC  No results found for this basename: TROPONINI,  in the last 168 hours  Recent Labs Lab 03/23/13 1353  PROBNP 7725.0*     CHEMISTRY  Recent Labs Lab 03/23/13 1353 03/24/13 0500 03/25/13 0400  NA 133* 133* 132*  K 4.8 5.0 4.9  CL 92* 92* 92*  CO2 25 22 22   GLUCOSE 162* 180* 92  BUN 21 41* 61*  CREATININE 2.94* 4.04* 5.16*  CALCIUM 9.0 9.6 9.2  PHOS  --  5.2*  --    The CrCl is unknown because both a height and weight (above a minimum accepted value) are required for this calculation.   LIVER  Recent Labs Lab 03/23/13 1353 03/24/13 0500  AST 18  --   ALT 16  --   ALKPHOS 279*  --   BILITOT 0.5  --   PROT 9.1*  --   ALBUMIN 3.5 3.0*     INFECTIOUS  Recent Labs Lab 03/23/13 1406 03/23/13 1659  LATICACIDVEN 2.78* 1.70     ENDOCRINE CBG (last 3)   Recent Labs  03/24/13 2347 03/25/13 0340 03/25/13 0800  GLUCAP 112* 101* 113*         IMAGING x48h  Dg Chest Port 1 View  03/24/2013   CLINICAL DATA:  Pulmonary infiltrates  EXAM: PORTABLE CHEST - 1 VIEW  COMPARISON:  March 23, 2013  FINDINGS: Central catheter tip is at the cavoatrial junction. No pneumothorax. There is patchy consolidation in both lung bases. There is underlying emphysema. Heart is enlarged with pulmonary venous hypertension.  IMPRESSION: The appearance is consistent with a degree of congestive heart failure superimposed on emphysema. No new opacity seen. Central catheter tip at cavoatrial junction, without pneumothorax. There is no appreciable change overall compared to 1 day prior.   Electronically Signed   By: Bretta Bang M.D.   On:  03/24/2013 07:35   Dg Chest Port 1 View  03/23/2013   CLINICAL DATA:  Central line placement.  EXAM: PORTABLE CHEST - 1 VIEW  COMPARISON:  03/23/2013.  FINDINGS: There is a new right IJ central line. This crosses the midline but the position is probably within the superior vena cava. There is some leftward mediastinal shift and consolidation at the right lung base and in the retrocardiac region.  There is no pneumothorax. Monitoring leads project over the chest. Notably, of the central line position follows previous Vas-Cath position 06/06/2012.  IMPRESSION: Uncomplicated right central line placement which crosses the midline, similar to prior vascular catheter position. The tip is in the lower SVC near the cavoatrial junction.   Electronically Signed   By: Andreas Newport M.D.   On: 03/23/2013 21:45   Dg Chest Port 1 View  03/23/2013   CLINICAL DATA:  Wheezing  EXAM: PORTABLE CHEST - 1 VIEW  COMPARISON:  November 15, 2012  FINDINGS: There is bibasilar consolidation. Lungs are otherwise clear. Heart is enlarged with normal pulmonary vascularity, stable. No adenopathy.  IMPRESSION: Bibasilar consolidation. Persistent cardiomegaly.   Electronically Signed   By: Bretta Bang M.D.   On: 03/23/2013 14:05      ASSESSMENT / PLAN:  PULMONARY A: {Prior Chronic critical illness., trch x 2 in 2010 following abdominal sepsis Acute respiratory failure from volume overload and probable PNA LL PNA Respiratory Acidosis - hypercarbia At risk for OSA; not on CPAP  P:   CPAP QHs  oxygen to keep SpO2 > 90% BiPAP prn prn albuterol  CARDIOVASCULAR A:  SIRS/sepsis - lactic acid clearing Hx of hypotension on midodrine as outpt P:  monitor hemodynamics levo for SBP of 90 (pt reports this as normal bp) midodrine  RENAL A:   ESRD- TTS HD Hyponatremia P:   Nephrology Consult, appreciate input Daily renal panel  GASTROINTESTINAL A:   Morbid obesity >> wife reports he has lost almost 500 lbs since  gastric sleeve in 2010. P:   renal diet protonix for SUP  HEMATOLOGIC A:   Leukocytosis- trending down P:  SQ heparin for DVT prevention  INFECTIOUS A:    HCAP >> bacterial vs viral (flu negative). High risk for MDR P:    vancomycin, aztreonam, levaquin  ENDOCRINE A:   Hyperglycemia.   P:   SSI  NEUROLOGIC A:   Acute encephalopathy 2nd to respiratory failure- resolving P:   monitor mental status hold outpt lexapro, roxicodone   Murtis Sink, MD [facility] Family Medicine Resident, PGY-2 03/25/2013, 8:42 AM  GLOBAL 10/27: No family at bedside. Patient updated by resident and  staff with nurse at bedside   STAFF NOTE: I, Dr Lavinia Sharps have personally reviewed patient's available data, including medical history, events of note, physical examination and test results as part of my evaluation. I have discussed with resident/NP and other care providers such as pharmacist, RN and RRT.  In addition,  I personally evaluated patient and elicited key findings of acute respiratory failure and septic shock. Currently he is off pressors. His only on face mask oxygen. Chest x-ray still shows left lower lobe consolidation that is unchanged. He is at risk for multidrug resistance due to H. cap. He is improving. His diabetic coverage is broad and good. Will do CPAP QHS for presumed sleep apnea.  Rest per NP/medical resident whose note is outlined above and that I agree with  The patient is critically ill with multiple organ systems failure and requires high complexity decision making for assessment and support, frequent evaluation and titration of therapies, application of advanced monitoring technologies and extensive interpretation of multiple databases.   Critical Care Time devoted to patient care services described in this note is  35  Minutes.  Dr. Kalman Shan, M.D., [facility].C.P Pulmonary and Critical Care Medicine Staff Physician [facility]  [facility] Pager: [phone], If no answer or between  15:00h - 7:00h: call [phone]  03/25/2013 11:13 AM

## 2013-03-25 NOTE — Progress Notes (Signed)
Utilization Review Completed.[name] T10/27/2014  

## 2013-03-25 NOTE — Progress Notes (Signed)
assessment:  1 pna right lower lobe  2 esrd cont hd tts  3 anemia of ckd- hgb 1 4 metabolic bone disease- ca and phos in range, cont phoslo 4ac,  5 chronichypotension/volume- l 6 morbid obesity, s/p gastric sleeve  plan- will follow, plan next hd on tues, 4 hrs 30 min,   Subjective: Interval History: No complaints  Objective: Vital signs in last 24 hours: Temp:  [97 F (36.1 C)-98 F (36.7 C)] 97 F (36.1 C) (10/27 1152) Pulse Rate:  [83-99] 86 (10/27 1400) Resp:  [12-20] 15 (10/27 1400) BP: (101-157)/(33-135) 117/53 mmHg (10/27 1400) SpO2:  [90 %-100 %] 96 % (10/27 1400) Arterial Line BP: (63-147)/(38-67) 90/51 mmHg (10/27 1300) Weight change:   Intake/Output from previous day: 10/26 0701 - 10/27 0700 In: 1155.3 [P.O.:500; I.V.:555.3; IV Piggyback:100] Out: -  Intake/Output this shift: Total I/O In: 141.6 [I.V.:141.6] Out: -   General appearance: alert and cooperative Resp: normal percussion bilaterally GI: obese and nontendet Extremities: edema 2+ bilat Neck with right IJ catheter  Lab Results:  Recent Labs  03/24/13 0500 03/25/13 0400  WBC 36.3* 20.0*  HGB 11.0* 9.3*  HCT 34.0* 28.5*  PLT 250 195   BMET:  Recent Labs  03/24/13 0500 03/25/13 0400  NA 133* 132*  K 5.0 4.9  CL 92* 92*  CO2 22 22  GLUCOSE 180* 92  BUN 41* 61*  CREATININE 4.04* 5.16*  CALCIUM 9.6 9.2   No results found for this basename: PTH,  in the last 72 hours Iron Studies: No results found for this basename: IRON, TIBC, TRANSFERRIN, FERRITIN,  in the last 72 hours Studies/Results: Dg Chest Port 1 View  03/24/2013   CLINICAL DATA:  Pulmonary infiltrates  EXAM: PORTABLE CHEST - 1 VIEW  COMPARISON:  March 23, 2013  FINDINGS: Central catheter tip is at the cavoatrial junction. No pneumothorax. There is patchy consolidation in both lung bases. There is underlying emphysema. Heart is enlarged with pulmonary venous hypertension.  IMPRESSION: The appearance is consistent with a  degree of congestive heart failure superimposed on emphysema. No new opacity seen. Central catheter tip at cavoatrial junction, without pneumothorax. There is no appreciable change overall compared to 1 day prior.   Electronically Signed   By: Bretta Bang M.D.   On: 03/24/2013 07:35   Dg Chest Port 1 View  03/23/2013   CLINICAL DATA:  Central line placement.  EXAM: PORTABLE CHEST - 1 VIEW  COMPARISON:  03/23/2013.  FINDINGS: There is a new right IJ central line. This crosses the midline but the position is probably within the superior vena cava. There is some leftward mediastinal shift and consolidation at the right lung base and in the retrocardiac region.  There is no pneumothorax. Monitoring leads project over the chest. Notably, of the central line position follows previous Vas-Cath position 06/06/2012.  IMPRESSION: Uncomplicated right central line placement which crosses the midline, similar to prior vascular catheter position. The tip is in the lower SVC near the cavoatrial junction.   Electronically Signed   By: Andreas Newport M.D.   On: 03/23/2013 21:45    Scheduled: . antiseptic oral rinse  15 mL Mouth Rinse q12n4p  . aztreonam  500 mg Intravenous Q12H  . calcium acetate  2,668 mg Oral TID WC  . chlorhexidine  15 mL Mouth Rinse BID  . heparin subcutaneous  5,000 Units Subcutaneous Q8H  . insulin aspart  0-9 Units Subcutaneous Q4H  . levofloxacin (LEVAQUIN) IV  500 mg Intravenous Q48H  .  midodrine  10 mg Oral TID AC  . pantoprazole  40 mg Oral Q1200  . [START ON 03/26/2013] vancomycin  1,500 mg Intravenous Q T,Th,Sa-HD     LOS: 2 days   Danah Reinecke C 03/25/2013,3:04 PM

## 2013-03-25 NOTE — Significant Event (Signed)
Pt c/o back pain.  Will order prn tylenol, oxycodone, fentanyl depending on severity of pain.  Will order prn zofran for nausea.  Coralyn Helling, MD 03/25/2013, 9:53 PM

## 2013-03-26 ENCOUNTER — Inpatient Hospital Stay (HOSPITAL_COMMUNITY): Payer: Medicare Other

## 2013-03-26 LAB — CBC
Hemoglobin: 9.2 g/dL — ABNORMAL LOW (ref 13.0–17.0)
Platelets: 158 10*3/uL (ref 150–400)
RDW: 15.2 % (ref 11.5–15.5)
WBC: 14.8 10*3/uL — ABNORMAL HIGH (ref 4.0–10.5)

## 2013-03-26 LAB — RENAL FUNCTION PANEL
Albumin: 2.6 g/dL — ABNORMAL LOW (ref 3.5–5.2)
Creatinine, Ser: 6.24 mg/dL — ABNORMAL HIGH (ref 0.50–1.35)
GFR calc Af Amer: 11 mL/min — ABNORMAL LOW (ref >90)
Phosphorus: 6.6 mg/dL — ABNORMAL HIGH (ref 2.3–4.6)
Potassium: 5.4 mEq/L — ABNORMAL HIGH (ref 3.5–5.1)
Sodium: 130 mEq/L — ABNORMAL LOW (ref 135–145)

## 2013-03-26 LAB — GLUCOSE, CAPILLARY
Glucose-Capillary: 101 mg/dL — ABNORMAL HIGH (ref 70–99)
Glucose-Capillary: 184 mg/dL — ABNORMAL HIGH (ref 70–99)
Glucose-Capillary: 98 mg/dL (ref 70–99)

## 2013-03-26 MED ORDER — SODIUM CHLORIDE 0.9 % IV SOLN: 100.0000 mL | INTRAVENOUS | Status: DC | PRN

## 2013-03-26 MED ORDER — LIDOCAINE HCL (PF) 1 % IJ SOLN: 5.0000 mL | INTRAMUSCULAR | Status: DC | PRN

## 2013-03-26 MED ORDER — HEPARIN SODIUM (PORCINE) 1000 UNIT/ML DIALYSIS
1000.0000 [IU] | INTRAMUSCULAR | Status: DC | PRN
  Filled 2013-03-26: qty 1

## 2013-03-26 MED ORDER — NEPRO/CARBSTEADY PO LIQD
237.0000 mL | ORAL | Status: DC | PRN
  Administered 2013-03-29: 237 mL via ORAL

## 2013-03-26 MED ORDER — HEPARIN SODIUM (PORCINE) 1000 UNIT/ML DIALYSIS
20.0000 [IU]/kg | INTRAMUSCULAR | Status: DC | PRN
  Administered 2013-03-28: 3000 [IU] via INTRAVENOUS_CENTRAL
  Filled 2013-03-26: qty 3

## 2013-03-26 MED ORDER — LIDOCAINE-PRILOCAINE 2.5-2.5 % EX CREA: 1.0000 "application " | TOPICAL_CREAM | CUTANEOUS | Status: DC | PRN

## 2013-03-26 MED ORDER — PENTAFLUOROPROP-TETRAFLUOROETH EX AERO: 1.0000 "application " | INHALATION_SPRAY | CUTANEOUS | Status: DC | PRN

## 2013-03-26 MED ORDER — ALTEPLASE 2 MG IJ SOLR: 2.0000 mg | Freq: Once | INTRAMUSCULAR | Status: AC | PRN

## 2013-03-26 MED ORDER — ALBUMIN HUMAN 25 % IV SOLN
INTRAVENOUS | Status: AC
  Filled 2013-03-26: qty 100

## 2013-03-26 MED ORDER — ALBUMIN HUMAN 25 % IV SOLN
25.0000 g | Freq: Once | INTRAVENOUS | Status: AC
  Administered 2013-03-26: 25 g via INTRAVENOUS

## 2013-03-26 MED ORDER — SODIUM CHLORIDE 0.9 % IV BOLUS (SEPSIS)
500.0000 mL | Freq: Once | INTRAVENOUS | Status: AC
  Administered 2013-03-26: 500 mL via INTRAVENOUS

## 2013-03-26 NOTE — Progress Notes (Signed)
ANTIBIOTIC CONSULT NOTE - FOLLOW UP  Pharmacy Consult for Levaquin  Indication: pneumonia  Allergies  Allergen Reactions  . Piperacillin Sod-Tazobactam So Hives    Patient Measurements: Weight: 147.4 kg  Vital Signs: Temp: 97.5 F (36.4 C) (10/28 1154) Temp src: Oral (10/28 1154) BP: 114/62 mmHg (10/28 1203) Pulse Rate: 97 (10/28 1210) Intake/Output from previous day: 10/27 0701 - 10/28 0700 In: 521.6 [I.V.:421.6; IV Piggyback:100] Out: -  Intake/Output from this shift: Total I/O In: 50 [IV Piggyback:50] Out: -   Labs:  Recent Labs  03/24/13 0500 03/25/13 0400 03/26/13 0425  WBC 36.3* 20.0* 14.8*  HGB 11.0* 9.3* 9.2*  PLT 250 195 158  CREATININE 4.04* 5.16* 6.24*   The CrCl is unknown because both a height and weight (above a minimum accepted value) are required for this calculation. No results found for this basename: VANCOTROUGH, Leodis Binet, VANCORANDOM, GENTTROUGH, GENTPEAK, GENTRANDOM, TOBRATROUGH, TOBRAPEAK, TOBRARND, AMIKACINPEAK, AMIKACINTROU, AMIKACIN,  in the last 72 hours   Microbiology: Recent Results (from the past 720 hour(s))  MRSA PCR SCREENING     Status: None   Collection Time    03/23/13  7:13 PM      Result Value Range Status   MRSA by PCR NEGATIVE  NEGATIVE Final   Comment:            The GeneXpert MRSA Assay (FDA     approved for NASAL specimens     only), is one component of a     comprehensive MRSA colonization     surveillance program. It is not     intended to diagnose MRSA     infection nor to guide or     monitor treatment for     MRSA infections.    Anti-infectives   Start     Dose/Rate Route Frequency Ordered Stop   03/26/13 1800  oseltamivir (TAMIFLU) capsule 75 mg  Status:  Discontinued     75 mg Oral Every T-Th-Sa (1800) 03/23/13 1533 03/24/13 1134   03/26/13 1200  vancomycin (VANCOCIN) 1,500 mg in sodium chloride 0.9 % 250 mL IVPB  Status:  Discontinued     1,500 mg 250 mL/hr over 60 Minutes Intravenous Every T-Th-Sa  (Hemodialysis) 03/23/13 1445 03/26/13 1203   03/25/13 1600  levofloxacin (LEVAQUIN) IVPB 500 mg     500 mg 100 mL/hr over 60 Minutes Intravenous Every 48 hours 03/23/13 1553     03/24/13 2000  aztreonam (AZACTAM) 1 g in dextrose 5 % 50 mL IVPB  Status:  Discontinued     1 g 100 mL/hr over 30 Minutes Intravenous Every 24 hours 03/24/13 1146 03/24/13 1201   03/24/13 1215  aztreonam (AZACTAM) 500 mg in dextrose 5 % 50 mL IVPB  Status:  Discontinued     500 mg 100 mL/hr over 30 Minutes Intravenous Every 12 hours 03/24/13 1201 03/26/13 1203   03/23/13 1630  oseltamivir (TAMIFLU) capsule 75 mg  Status:  Discontinued     75 mg Oral To Emergency Dept 03/23/13 1548 03/24/13 1134   03/23/13 1600  levofloxacin (LEVAQUIN) IVPB 750 mg     750 mg 100 mL/hr over 90 Minutes Intravenous  Once 03/23/13 1552 03/23/13 2002   03/23/13 1530  vancomycin (VANCOCIN) 2,500 mg in sodium chloride 0.9 % 500 mL IVPB     2,500 mg 250 mL/hr over 120 Minutes Intravenous  Once 03/23/13 1445 03/23/13 1823   03/23/13 1500  aztreonam (AZACTAM) 2 g in dextrose 5 % 50 mL IVPB  2 g 100 mL/hr over 30 Minutes Intravenous  Once 03/23/13 1418 03/23/13 1550      Assessment: 47 year old male w/ESRD (HD TTS) admitted from his HD session with AMS and hypoxia. Patient is being treated for pneumonia.   Patient is afebrile and WBC is trending down (36.3>20>14.8). Respiratory viral panel and flu PCR on 10/25 were negative, but 10/25 blood and respiratory cultures were not collected.   Levaquin 10/25>> Azactam 10/25>>10/28 Vancomycin 10/25>>10/28 Tamiflu 10/25>>10/26   Goal of Therapy:  Eradication of infection   Plan:  1. Continue Levaquin 500mg  IV q48hr 2. Follow-up renal plans, cultures, and clinical course; adjust as indicated  Tomie China, PharmD Candidate  03/26/2013,12:35 PM  I have reviewed this patient and agree with the assessment and plan as described above.  Shelita Steptoe C. Khyler Urda, PharmD Clinical  Pharmacist-Resident Pager: [phone] 03/26/2013 3:27 PM

## 2013-03-26 NOTE — Progress Notes (Signed)
PULMONARY  / CRITICAL CARE MEDICINE  Name: [name] MRN: [id number] DOB: 12-06-1965    ADMISSION DATE:  03/23/2013  REFERRING MD :  Gerhard Munch  CHIEF COMPLAINT:  Cant breath  BRIEF PATIENT DESCRIPTION:  47 y/o male from dialysis center with dyspnea, hypoxia, and cough with yellow sputum.  Found to have PNA.  PCCM asked to admit to ICU. He has ESRD and on daily midodrine  SIGNIFICANT EVENTS: 10/25 - Admit to ICU, renal consulted 10/26 - levo gtt, bipap 10/27 - levo weaned, intermittent BiPAP + bipap qhs (desat on cpap)  STUDIES:    LINES / TUBES: Rt IJ CVL 10/26 >> Aline 10/26 >>  CULTURES: Blood 10/25 >> not collected Sputum 10/25 >> not collected Influenza 10/25 >> negative  MRSA PCR 10/25>> negative  ANTIBIOTICS: Azactam 10/25 >> Vancomycin 10/25 >> Levaquin 10/25 >> Tamiflu 10/25 >> 10/26  SUBJECTIVE: Levo weaned off on 10/27, no acute events overnight. Patient desatted on CPAP overnight but did well on venturi mask or BiPAP.   VITAL SIGNS: Temp:  [97 F (36.1 C)-97.8 F (36.6 C)] 97.8 F (36.6 C) (10/28 0409) Pulse Rate:  [81-96] 95 (10/28 0600) Resp:  [12-35] 13 (10/28 0600) BP: (96-130)/(33-88) 104/50 mmHg (10/28 0804) SpO2:  [91 %-100 %] 94 % (10/28 0600) Arterial Line BP: (63-116)/(51-67) 90/51 mmHg (10/27 1300) FiO2 (%):  [40 %-50 %] 40 % (10/28 0804) Weight:  [324 lb 15.3 oz (147.4 kg)] 324 lb 15.3 oz (147.4 kg) (10/28 0500) VM  INTAKE / OUTPUT: Intake/Output     10/27 0701 - 10/28 0700 10/28 0701 - 10/29 0700   P.O.     I.V. (mL/kg) 421.6 (2.9)    IV Piggyback 100    Total Intake(mL/kg) 521.6 (3.5)    Net +521.6            PHYSICAL EXAMINATION: General: morbidly obese male lying in bed, NAD Neuro:  Awake, A &O X 3, no movement of L foot, otherwise non-focal exam HEENT:  Pupils reactive, no sinus tenderness Cardiovascular:  Regular, tachycardic, no murmur Lungs: Scattered rhonchi, decreased breath sounds,  no wheeze, on  BiPAP Abdomen:  Obese, mild tenderness throughout to palpation Musculoskeletal:  trace to 1+ edema Skin:  No rashes  LABS: PULMONARY  Recent Labs Lab 03/23/13 1630 03/23/13 2051 03/24/13 0407  PHART 7.398 7.351 7.284*  PCO2ART 38.4 39.8 48.4*  PO2ART 65.0* 86.0 150.0*  HCO3 23.7 22.0 22.2  TCO2 25 23 23.7  O2SAT 92.0 96.0 97.6    CBC  Recent Labs Lab 03/24/13 0500 03/25/13 0400 03/26/13 0425  HGB 11.0* 9.3* 9.2*  HCT 34.0* 28.5* 28.5*  WBC 36.3* 20.0* 14.8*  PLT 250 195 158    COAGULATION No results found for this basename: INR,  in the last 168 hours  CARDIAC  No results found for this basename: TROPONINI,  in the last 168 hours  Recent Labs Lab 03/23/13 1353  PROBNP 7725.0*     CHEMISTRY  Recent Labs Lab 03/23/13 1353 03/24/13 0500 03/25/13 0400 03/26/13 0425  NA 133* 133* 132* 130*  K 4.8 5.0 4.9 5.4*  CL 92* 92* 92* 91*  CO2 25 22 22 22   GLUCOSE 162* 180* 92 103*  BUN 21 41* 61* 76*  CREATININE 2.94* 4.04* 5.16* 6.24*  CALCIUM 9.0 9.6 9.2 8.9  PHOS  --  5.2*  --  6.6*   The CrCl is unknown because both a height and weight (above a minimum accepted value) are required for this calculation.  LIVER  Recent Labs Lab 03/23/13 1353 03/24/13 0500 03/26/13 0425  AST 18  --   --   ALT 16  --   --   ALKPHOS 279*  --   --   BILITOT 0.5  --   --   PROT 9.1*  --   --   ALBUMIN 3.5 3.0* 2.6*     INFECTIOUS  Recent Labs Lab 03/23/13 1406 03/23/13 1659  LATICACIDVEN 2.78* 1.70     ENDOCRINE CBG (last 3)   Recent Labs  03/25/13 1940 03/25/13 2356 03/26/13 0408  GLUCAP 111* 184* 97    IMAGING x48h  No results found.  ASSESSMENT / PLAN:  PULMONARY A: Prior Chronic critical illness., trch x 2 in 2010 following abdominal sepsis Acute respiratory failure from volume overload and probable PNA LL PNA Respiratory Acidosis - hypercarbia At risk for OSA; not on CPAP Likely OHS  P:   BiPAP qHS and daytime  PRN oxygen to  keep SpO2 > 90% prn albuterol  CARDIOVASCULAR A:  SIRS/sepsis - lactic acid clearing Hx of hypotension on midodrine as outpt  10/28: 26ff levophed x 24h P:  monitor hemodynamics Midodrine for sbp > 90  RENAL A:   ESRD- TTS HD Hyponatremia P:   Nephrology Consult, appreciate input Daily renal panel  GASTROINTESTINAL A:   Morbid obesity >> wife reports he has lost almost 500 lbs since gastric sleeve in 2010. P:   renal diet protonix for SUP  HEMATOLOGIC A:   Leukocytosis- trending down Anemia of chronic and critical illness P:  PRBC for hgb < 7gm% only SQ heparin for DVT prevention  INFECTIOUS A:    HCAP >> bacterial vs viral (flu negative). High risk for MDR. Rx handicapped by fact no cutlures sent P:    dc vancomycin Dc aztreonam Continue levaquin; stop needs to be established  ENDOCRINE A:   Hyperglycemia.   P:   SSI  NEUROLOGIC A:   Acute encephalopathy secondary to respiratory failure- resolved P:   monitor mental status hold outpt lexapro, roxicodone  Today's Summary: 10/28: No family at bedside. Patient updated at bedside. Will continue to treat HCAP with IV antibiotics and fluid overload with intermittent HD per renal. Continue ICU status with PRN BiPAP needed, may be stable on only venti mask today and ready for floor tomorrow.   Murtis Sink, MD [facility] Resident, PGY-2 03/26/2013, 8:15 AM    Staff note  Patient move to sDU. To [facility] SDU    Dr. Kalman Shan, M.D., [facility].C.P Pulmonary and Critical Care Medicine Staff Physician [facility] [place] Pulmonary and Critical Care Pager: [phone], If no answer or between  15:00h - 7:00h: call [phone]  03/26/2013 12:03 PM

## 2013-03-26 NOTE — Progress Notes (Addendum)
Interval note  Called to bedside by RN for concern that he was having difficulty speaking and not able to use his L side. He has just returned from HD (-1.5L) and has had hypotension to 70s/40's.   PE MAP 57, HR 101  G: NAD, staring blankly, frequent jerking movements (has been observed yesterday and this am) Neuro: Alert and oriented to only person, Able to move BL hands and R foot spontaneously, L foot not moving (stable from previous exam) strength 3-4/5 throughout (except LLE), symmetric smile, speech is slow and difficult to understand.   A/P Given that there are no focal nero deficit and MAP is currently less than 60 I think a new stroke is not certain. Most likely this is due to hypotensive changes.   Will check CVP with goal 12-14 Bolus NS 500 mL If no resolution with correction of BP will consider imaging brain;  Will follow closely clinically  Murtis Sink, MD [facility] Resident, PGY-2 03/26/2013, 6:50 PM    Late entry  Re-evaluated patient around 10pm, At that time his exam was still non -focal and he was more alert than earlier. His pressures have fluctuated from MAP of mid 50s to 70s. His cuff is on his wrist and so seems untrustworthy. He is alert and oriented to person only for me but seems to be unwilling to interact rather than unable to at the moment. HE has been A&O X 3 for nursing in the interval.   Will continue to monitor and defer imaging or other workup for stroke/TIA as this seems to be not the etiology of his current condition.   Murtis Sink, MD [facility] Resident, PGY-2 03/26/2013, 11:36 PM

## 2013-03-26 NOTE — Progress Notes (Signed)
assessment:  1 pna right lower lobe  2 esrd cont hd tts  3 anemia of ckd-  4 metabolic bone disease- ca and phos in range, cont phoslo 4ac,  5 chronichypotension/volume- l  6 morbid obesity, s/p gastric sleeve  plan- will follow, plan next hd today, 4 hrs 30 min,   Subjective: Interval History: No change  Objective: Vital signs in last 24 hours: Temp:  [97 F (36.1 C)-97.8 F (36.6 C)] 97.8 F (36.6 C) (10/28 0409) Pulse Rate:  [81-96] 95 (10/28 0600) Resp:  [12-35] 13 (10/28 0600) BP: (96-130)/(33-88) 127/42 mmHg (10/28 0600) SpO2:  [91 %-100 %] 94 % (10/28 0600) Arterial Line BP: (63-116)/(51-67) 90/51 mmHg (10/27 1300) FiO2 (%):  [50 %] 50 % (10/28 0400) Weight:  [147.4 kg (324 lb 15.3 oz)] 147.4 kg (324 lb 15.3 oz) (10/28 0500) Weight change:   Intake/Output from previous day: 10/27 0701 - 10/28 0700 In: 521.6 [I.V.:421.6; IV Piggyback:100] Out: -  Intake/Output this shift:    General appearance: alert and cooperative Chest wall: no tenderness GI: markedly obese with scar luq Extremities: 2+ edema with LUE AVF  Lab Results:  Recent Labs  03/25/13 0400 03/26/13 0425  WBC 20.0* 14.8*  HGB 9.3* 9.2*  HCT 28.5* 28.5*  PLT 195 158   BMET:  Recent Labs  03/25/13 0400 03/26/13 0425  NA 132* 130*  K 4.9 5.4*  CL 92* 91*  CO2 22 22  GLUCOSE 92 103*  BUN 61* 76*  CREATININE 5.16* 6.24*  CALCIUM 9.2 8.9   No results found for this basename: PTH,  in the last 72 hours Iron Studies: No results found for this basename: IRON, TIBC, TRANSFERRIN, FERRITIN,  in the last 72 hours Studies/Results: No results found.  Scheduled: . antiseptic oral rinse  15 mL Mouth Rinse q12n4p  . aztreonam  500 mg Intravenous Q12H  . calcium acetate  2,668 mg Oral TID WC  . chlorhexidine  15 mL Mouth Rinse BID  . heparin subcutaneous  5,000 Units Subcutaneous Q8H  . insulin aspart  0-9 Units Subcutaneous Q4H  . levofloxacin (LEVAQUIN) IV  500 mg Intravenous Q48H  .  midodrine  10 mg Oral TID AC  . pantoprazole  40 mg Oral Q1200  . vancomycin  1,500 mg Intravenous Q T,Th,Sa-HD     LOS: 3 days   Spike Desilets C 03/26/2013,7:35 AM

## 2013-03-26 NOTE — Progress Notes (Signed)
Patient requested to stay on BiPAP a while longer today. RT will continue to monitor.

## 2013-03-27 ENCOUNTER — Inpatient Hospital Stay (HOSPITAL_COMMUNITY): Payer: Medicare Other

## 2013-03-27 DIAGNOSIS — G9340 Encephalopathy, unspecified: Secondary | ICD-10-CM

## 2013-03-27 LAB — CBC
HCT: 24.3 % — ABNORMAL LOW (ref 39.0–52.0)
Hemoglobin: 8.1 g/dL — ABNORMAL LOW (ref 13.0–17.0)
Hemoglobin: 7.7 g/dL — ABNORMAL LOW (ref 13.0–17.0)
MCH: 31.6 pg (ref 26.0–34.0)
MCH: 31 pg (ref 26.0–34.0)
MCV: 98 fL (ref 78.0–100.0)
Platelets: 151 10*3/uL (ref 150–400)
Platelets: 146 10*3/uL — ABNORMAL LOW (ref 150–400)
RBC: 2.56 MIL/uL — ABNORMAL LOW (ref 4.22–5.81)
RBC: 2.48 MIL/uL — ABNORMAL LOW (ref 4.22–5.81)
WBC: 9.7 10*3/uL (ref 4.0–10.5)

## 2013-03-27 LAB — LACTIC ACID, PLASMA: Lactic Acid, Venous: 0.6 mmol/L (ref 0.5–2.2)

## 2013-03-27 LAB — RENAL FUNCTION PANEL
Albumin: 2.7 g/dL — ABNORMAL LOW (ref 3.5–5.2)
BUN: 37 mg/dL — ABNORMAL HIGH (ref 6–23)
CO2: 21 mEq/L (ref 19–32)
Chloride: 96 mEq/L (ref 96–112)
GFR calc Af Amer: 21 mL/min — ABNORMAL LOW (ref >90)
Glucose, Bld: 87 mg/dL (ref 70–99)
Potassium: 4.6 mEq/L (ref 3.5–5.1)

## 2013-03-27 LAB — PROTIME-INR: INR: 1.12 (ref 0.00–1.49)

## 2013-03-27 LAB — POCT I-STAT 3, ART BLOOD GAS (G3+)
Acid-base deficit: 6 mmol/L — ABNORMAL HIGH (ref 0.0–2.0)
Bicarbonate: 21.2 mEq/L (ref 20.0–24.0)
Patient temperature: 98
pCO2 arterial: 48.4 mmHg — ABNORMAL HIGH (ref 35.0–45.0)
pH, Arterial: 7.247 — ABNORMAL LOW (ref 7.350–7.450)

## 2013-03-27 LAB — HEPATIC FUNCTION PANEL
ALT: 7 U/L (ref 0–53)
Alkaline Phosphatase: 181 U/L — ABNORMAL HIGH (ref 39–117)
Indirect Bilirubin: 0.2 mg/dL — ABNORMAL LOW (ref 0.3–0.9)
Total Bilirubin: 0.4 mg/dL (ref 0.3–1.2)
Total Protein: 7.4 g/dL (ref 6.0–8.3)

## 2013-03-27 LAB — GLUCOSE, CAPILLARY
Glucose-Capillary: 114 mg/dL — ABNORMAL HIGH (ref 70–99)
Glucose-Capillary: 81 mg/dL (ref 70–99)
Glucose-Capillary: 86 mg/dL (ref 70–99)
Glucose-Capillary: 98 mg/dL (ref 70–99)

## 2013-03-27 LAB — AMMONIA: Ammonia: 17 umol/L (ref 11–60)

## 2013-03-27 LAB — FOLATE: Folate: 16.8 ng/mL

## 2013-03-27 LAB — TROPONIN I
Troponin I: <0.3 ng/mL (ref <0.30)
Troponin I: <0.3 ng/mL (ref <0.30)

## 2013-03-27 LAB — PROCALCITONIN: Procalcitonin: 29.98 ng/mL

## 2013-03-27 LAB — TSH: TSH: 0.815 u[IU]/mL (ref 0.350–4.500)

## 2013-03-27 MED ORDER — PANTOPRAZOLE SODIUM 40 MG IV SOLR
40.0000 mg | Freq: Every day | INTRAVENOUS | Status: DC
  Administered 2013-03-27 – 2013-03-31 (×5): 40 mg via INTRAVENOUS
  Filled 2013-03-27 (×5): qty 40

## 2013-03-27 MED ORDER — DARBEPOETIN ALFA-POLYSORBATE 150 MCG/0.3ML IJ SOLN
150.0000 ug | INTRAMUSCULAR | Status: DC
  Administered 2013-03-28: 150 ug via INTRAVENOUS
  Filled 2013-03-27: qty 0.3

## 2013-03-27 MED ORDER — RENA-VITE PO TABS
1.0000 | ORAL_TABLET | Freq: Every day | ORAL | Status: DC
  Administered 2013-03-27 – 2013-04-01 (×5): 1 via ORAL
  Filled 2013-03-27 (×7): qty 1

## 2013-03-27 NOTE — Progress Notes (Signed)
Pt was for MRI but pt wt is over table limit, unable to scan

## 2013-03-27 NOTE — Progress Notes (Signed)
assessment:  1 pna right lower lobe  2 esrd cont hd tts  3 anemia of ckd-  4 metabolic bone disease- ca and phos in range, cont phoslo 4ac,  5 chronichypotension/volume- l  6 morbid obesity, s/p gastric sleeve  plan- will follow, plan next hd Thursday, 4 hrs 30 min,   Subjective: Interval History: MS deteriorated last PM and had increase myoclonic twiching.  Was on narcotics and SSRI at home.  Objective: Vital signs in last 24 hours: Temp:  [97.5 F (36.4 C)-99.5 F (37.5 C)] 99.5 F (37.5 C) (10/29 0359) Pulse Rate:  [31-129] 102 (10/29 0615) Resp:  [12-30] 17 (10/29 0615) BP: (63-180)/(30-135) 110/53 mmHg (10/29 0615) SpO2:  [70 %-96 %] 93 % (10/29 0615) FiO2 (%):  [40 %-50 %] 40 % (10/29 0425) Weight change:   Intake/Output from previous day: 10/28 0701 - 10/29 0700 In: 90 [I.V.:40; IV Piggyback:50] Out: 1563  Intake/Output this shift:    General appearance: agitated, inattentive Back: negative Resp: diminished breath sounds bilaterally Cardio: regular rate and rhythm, S1, S2 normal, no murmur, click, rub or gallop Extremities: edema 2+ Myoclonic jerks are present, improved from yesterday  Lab Results:  Recent Labs  03/26/13 0425 03/27/13 0500  WBC 14.8* 9.7  HGB 9.2* 8.1*  HCT 28.5* 25.1*  PLT 158 151   BMET:  Recent Labs  03/26/13 0425 03/27/13 0500  NA 130* 133*  K 5.4* 4.6  CL 91* 96  CO2 22 21  GLUCOSE 103* 87  BUN 76* 37*  CREATININE 6.24* 3.78*  CALCIUM 8.9 9.0   No results found for this basename: PTH,  in the last 72 hours Iron Studies: No results found for this basename: IRON, TIBC, TRANSFERRIN, FERRITIN,  in the last 72 hours Studies/Results: Dg Chest Port 1 View  03/26/2013   CLINICAL DATA:  Pneumonia versus pulmonary edema. Shortness of breath.  EXAM: PORTABLE CHEST - 1 VIEW  COMPARISON:  03/25/2013  FINDINGS: Right central line remains in place, unchanged. Cardiomegaly. Bilateral perihilar airspace opacities. Increasing opacity in  the left upper lobe and right lung base. Findings could represent asymmetric edema or infection. No visible effusions.  IMPRESSION: Bilateral airspace opacities, increasing since prior study, asymmetric edema versus infection.   Electronically Signed   By: Charlett Nose M.D.   On: 03/26/2013 09:16    LOS: 4 days   Lee-Anne Flicker C 03/27/2013,7:24 AM

## 2013-03-27 NOTE — Progress Notes (Signed)
PULMONARY  / CRITICAL CARE MEDICINE  Name: [name] MRN: [id number] DOB: September 11, 1965    ADMISSION DATE:  03/23/2013  REFERRING MD :  Gerhard Munch  CHIEF COMPLAINT:  Cant breath  BRIEF PATIENT DESCRIPTION:  47 y/o male from dialysis center with dyspnea, hypoxia, and cough with yellow sputum.  Found to have PNA.  PCCM asked to admit to ICU. He has ESRD and on daily midodrine  SIGNIFICANT EVENTS/Studies: 10/25 - Admit to ICU, renal consulted 10/26 - levo gtt, bipap 10/27 - levo weaned, intermittent BiPAP + bipap qhs (desat on cpap) 10/28 - episode of dysarthria and decreased alertness after HD that resolved with fluid bolus  LINES / TUBES: Rt IJ CVL 10/26 >> Aline 10/26 >>  CULTURES: Blood 10/25 >> not collected Sputum 10/25 >> not collected Influenza 10/25 >> negative  MRSA PCR 10/25>> negative ............ Blood culture 10/29 >.   ANTIBIOTICS: Azactam 10/25 >>10/28 Vancomycin 10/25 >>10/28 Levaquin 10/25 >> Tamiflu 10/25 >> 10/26  SUBJECTIVE: Short episode of dysarthria and decreased movement of L hand while hypotensive last night after HD. Improved rapidly during serial exams and after 500 mL bolus. MS continued to wax and wane throughout the, at times he was A&OX 3 and answering question appropriately and at times he was A&OX1.   VITAL SIGNS: Temp:  [97.5 F (36.4 C)-99.5 F (37.5 C)] 99.5 F (37.5 C) (10/29 0359) Pulse Rate:  [31-129] 102 (10/29 0615) Resp:  [12-30] 17 (10/29 0615) BP: (63-180)/(30-135) 110/53 mmHg (10/29 0615) SpO2:  [70 %-96 %] 93 % (10/29 0615) FiO2 (%):  [40 %-50 %] 40 % (10/29 0425) VM  INTAKE / OUTPUT: Intake/Output     10/28 0701 - 10/29 0700 10/29 0701 - 10/30 0700   I.V. (mL/kg) 40 (0.3)    IV Piggyback 50    Total Intake(mL/kg) 90 (0.6)    Other 1563    Total Output 1563     Net -1473            PHYSICAL EXAMINATION: General: morbidly obese male lying in bed, NAD Neuro:  Awake, A &O X 1, no movement of L foot,  otherwise non-focal exam HEENT:  Pupils reactive Cardiovascular:  Regular, tachycardic, no murmur Lungs: Scattered rhonchi, decreased breath sounds,  no wheeze, on BiPAP Abdomen:  Obese, mild tenderness throughout to palpation Musculoskeletal:  trace to 1+ edema Skin:  No rashes  LABS: PULMONARY  Recent Labs Lab 03/23/13 1630 03/23/13 2051 03/24/13 0407  PHART 7.398 7.351 7.284*  PCO2ART 38.4 39.8 48.4*  PO2ART 65.0* 86.0 150.0*  HCO3 23.7 22.0 22.2  TCO2 25 23 23.7  O2SAT 92.0 96.0 97.6    CBC  Recent Labs Lab 03/25/13 0400 03/26/13 0425 03/27/13 0500  HGB 9.3* 9.2* 8.1*  HCT 28.5* 28.5* 25.1*  WBC 20.0* 14.8* 9.7  PLT 195 158 151    COAGULATION No results found for this basename: INR,  in the last 168 hours  CARDIAC  No results found for this basename: TROPONINI,  in the last 168 hours  Recent Labs Lab 03/23/13 1353  PROBNP 7725.0*     CHEMISTRY  Recent Labs Lab 03/23/13 1353 03/24/13 0500 03/25/13 0400 03/26/13 0425 03/27/13 0500  NA 133* 133* 132* 130* 133*  K 4.8 5.0 4.9 5.4* 4.6  CL 92* 92* 92* 91* 96  CO2 25 22 22 22 21   GLUCOSE 162* 180* 92 103* 87  BUN 21 41* 61* 76* 37*  CREATININE 2.94* 4.04* 5.16* 6.24* 3.78*  CALCIUM 9.0 9.6  9.2 8.9 9.0  PHOS  --  5.2*  --  6.6* 4.7*   The CrCl is unknown because both a height and weight (above a minimum accepted value) are required for this calculation.   LIVER  Recent Labs Lab 03/23/13 1353 03/24/13 0500 03/26/13 0425 03/27/13 0500  AST 18  --   --   --   ALT 16  --   --   --   ALKPHOS 279*  --   --   --   BILITOT 0.5  --   --   --   PROT 9.1*  --   --   --   ALBUMIN 3.5 3.0* 2.6* 2.7*     INFECTIOUS  Recent Labs Lab 03/23/13 1406 03/23/13 1659  LATICACIDVEN 2.78* 1.70     ENDOCRINE CBG (last 3)   Recent Labs  03/26/13 1935 03/27/13 0002 03/27/13 0356  GLUCAP 101* 114* 98    IMAGING x48h  Dg Chest Port 1 View  03/26/2013   CLINICAL DATA:  Pneumonia versus  pulmonary edema. Shortness of breath.  EXAM: PORTABLE CHEST - 1 VIEW  COMPARISON:  03/25/2013  FINDINGS: Right central line remains in place, unchanged. Cardiomegaly. Bilateral perihilar airspace opacities. Increasing opacity in the left upper lobe and right lung base. Findings could represent asymmetric edema or infection. No visible effusions.  IMPRESSION: Bilateral airspace opacities, increasing since prior study, asymmetric edema versus infection.   Electronically Signed   By: Charlett Nose M.D.   On: 03/26/2013 09:16    ASSESSMENT / PLAN:  PULMONARY A: Acute respiratory failure from volume overload and probable PNA LL PNA Respiratory Acidosis - hypercarbia Prior Chronic critical illness., trch x 2 in 2010 following abdominal sepsis At risk for OSA; not on CPAP Likely OHS  P:   BiPAP qHS oxygen to keep SpO2 > 90% prn albuterol  CARDIOVASCULAR A:  SIRS/sepsis - lactic acid clearing Hx of hypotension on midodrine as outpt  10/29: transient hypotension after HD, likely fluid related P:  monitor hemodynamics Midodrine for sbp > 90  RENAL A:   ESRD- TTS HD Hyponatremia P:   Nephrology Consult, appreciate input Daily renal panel Replace lytes as indicated  GASTROINTESTINAL A:   Morbid obesity >> wife reports he has lost almost 500 lbs since gastric sleeve in 2010. At risk for NASH - Cirrhosis but no clinical stigmata of cirrhosis and not reported in Imaging 2012-2014 P:   Npo due to mental status Protonix for SUP  HEMATOLOGIC A:   Leukocytosis- trending down Anemia of chronic and critical illness P:  PRBC for hgb < 7gm% only SQ heparin for DVT prevention  INFECTIOUS A:    HCAP >> bacterial vs viral (flu negative). High risk for MDR. Rx handicapped by fact no cutlures sent P:   Continue Levaquin, plan 10 day course  ENDOCRINE A:   Hyperglycemia.   P:   SSI  NEUROLOGIC A:   Acute encephalopathy - multifactorial with hypotension and possible component of  ICU delirium with rapid waxing and waning Myoclonic jerking- possible RLS vs SSRI withdrawal vs medication effect  03/27/13 staff md note: Appears to have delirium that developed after dialysis yesterday. Only red herring it is transient left arm weakness associated with hypotension shortly after dialysis. Differential diagnosis currently for delirium includes quinolone, SSRI withdrawal, undiagnosed cirrhosis with hepatic encephalopathy [no evidence of cirrhosis and prior imaging], incipient sepsis, hypercarbia  P:   monitor mental status hold outpt lexapro, roxicodone for now until MS clears  Continue quinolone for now Check ABG, lactic acid, pro-calcitonin, blood culture, ammonia level Get neurology consultation  Today's Summary: 10/29: No family at bedside. Will continue to treat HCAP with IV antibiotics and fluid overload with intermittent HD per renal.    Murtis Sink, MD [facility] Resident, PGY-2 03/27/2013, 7:31 AM  STAFF NOTE: I, Dr Lavinia Sharps have personally reviewed patient's available data, including medical history, events of note, physical examination and test results as part of my evaluation. I have discussed with resident/NP and other care providers such as pharmacist, RN and RRT.  In addition,  I personally evaluated patient and elicited key findings of - ssee staff MD note*.  Rest per NP/medical resident whose note is outlined above and that I agree with  The patient is critically ill with multiple organ systems failure and requires high complexity decision making for assessment and support, frequent evaluation and titration of therapies, application of advanced monitoring technologies and extensive interpretation of multiple databases.   Critical Care Time devoted to patient care services described in this note is  35  Minutes.  Dr. Kalman Shan, M.D., [facility].C.P Pulmonary and Critical Care Medicine Staff Physician [facility] [place]  Pulmonary and Critical Care Pager: [phone], If no answer or between  15:00h - 7:00h: call [phone]  03/27/2013 9:15 AM          STAFF NOTE: I, Dr Lavinia Sharps have personally reviewed patient's available data, including medical history, events of note, physical examination and test results as part of my evaluation. I have discussed with resident/NP and other care providers such as pharmacist, RN and RRT.  In addition,  I personally evaluated patient and elicited key findings of see staff MD note.  Rest per NP/medical resident whose note is outlined above and that I agree with  The patient is critically ill with multiple organ systems failure and requires high complexity decision making for assessment and support, frequent evaluation and titration of therapies, application of advanced monitoring technologies and extensive interpretation of multiple databases.   Critical Care Time devoted to patient care services described in this note is  35  Minutes.  Dr. Kalman Shan, M.D., [facility].C.P Pulmonary and Critical Care Medicine Staff Physician [facility] [place] Pulmonary and Critical Care Pager: [phone], If no answer or between  15:00h - 7:00h: call [phone]  03/27/2013 9:14 AM

## 2013-03-27 NOTE — Progress Notes (Signed)
EEG completed; results pending.    

## 2013-03-27 NOTE — Consult Note (Signed)
NEURO HOSPITALIST CONSULT NOTE    Reason for Consult: transient left sided weakness in setting of MAP 50 during dialysis and waxing/waning mental status  HPI:                                                                                                                                          [name] is an 47 y.o. male who per notes has been getting more sleepy, and having jerky movements per his wife. He completed his HD, but was still was feeling short of breath on 03-23-13. They are not sure how much fluid was removed with HD. He was sent to ED. He was noted to be hypoxic and placed on supplemental oxygen. His chest xray showed basilar infiltrates with possible pneumonia.  He was given vancomycin, aztreonam. While on dialysis yesterday his BP dropped to 63/49 and he was noted to have transient slurred speech, left arm and leg weakness. By the time the resident was at bedside his neuro deficits had resolved.  Pateint was given fluids to improve his BP nut since even his mental status continues to wax and wane.  Currently patient is able to tell his name and follow simple commands but then will stare into space and not be involved in exam.  When asked to count fingers he states he cannot see the fingers but does blink to threat. He withdraws to pain briskly. He does have visible asterixis like movements and does not move his left leg as well as his right.  He has known left foot drop according to notes.    WBC has improved since admission from 36 to 9.0 Ammonia 17 AST/ALT normal NA 133 Bun 37 Cr 3.78 Ca 9  He has received no narcotics today  Past Medical History  Diagnosis Date  . Hypertension   . GERD (gastroesophageal reflux disease)   . DVT (deep venous thrombosis)   . Complication of anesthesia     " I am hard  to wake up "  . ESRD on hemodialysis     Dialysis at [facility] in [place], [place]. Started dialysis in 2010. ESRD due to DM/HTN.  .  Diabetes mellitus   . ESRD (end stage renal disease) on dialysis 08/24/2011    esrd gets hd in [place] with [facility] on tts schedule, started dialysis in 2010     Past Surgical History  Procedure Laterality Date  . Gastric sleeve    . Kidney stones    . Dialysis port    . Compression hip screw  08/26/2011    Procedure: COMPRESSION HIP;  Surgeon: Velna Ochs, MD;  Location: [facility] OR;  Service: Orthopedics;  Laterality: Left;  DYNAMIC HIP SCREW COMPRESSION  . Ivc filter    .  Cholecystostomy tube        Family History: Unable to obtain  Social History:  reports that he has been smoking Cigarettes.  He has been smoking about 0.00 packs per day. He has never used smokeless tobacco. He reports that he does not drink alcohol or use illicit drugs.  Allergies  Allergen Reactions  . Piperacillin Sod-Tazobactam So Hives    MEDICATIONS:                                                                                                                     Scheduled: . antiseptic oral rinse  15 mL Mouth Rinse q12n4p  . calcium acetate  2,668 mg Oral TID WC  . chlorhexidine  15 mL Mouth Rinse BID  . heparin subcutaneous  5,000 Units Subcutaneous Q8H  . insulin aspart  0-9 Units Subcutaneous Q4H  . levofloxacin (LEVAQUIN) IV  500 mg Intravenous Q48H  . midodrine  10 mg Oral TID AC  . pantoprazole (PROTONIX) IV  40 mg Intravenous QHS     ROS:                                                                                                                                       History obtained from unobtainable from patient due to mental status     Blood pressure 111/47, pulse 100, temperature 98 F (36.7 C), temperature source Oral, resp. rate 20, weight 147.4 kg (324 lb 15.3 oz), SpO2 94.00%.   Neurologic Examination:                                                                                                      Mental Status: Drowsy, awakens to his name called and  will participate in some of the exam but then will stop.  He does blink to threat but states he cannot see light or objects placed infront of him. Not oriented, thought content appropriate.  Speech fluent, but he does not speak much during my exam.  Cranial Nerves: II: Discs flat bilaterally; Visual fields as above, pupils equal, round, reactive to light and accommodation III,IV, VI: ptosis not present, extra-ocular motions intact bilaterally V,VII: smile symmetric, facial light touch sensation normal bilaterally VIII: hearing normal bilaterally IX,X: gag reflex present XI: bilateral shoulder shrug XII: midline tongue extension without atrophy or fasciculations Motor: Moves bilateral UE antigravity, right LE withdraws to pain briskly with good 4/5 strength, left LE withdraws to pain but with less strength and not off the bed.  His left ankle shows minimal movement but he is believed to have left foot drop at baseline. Asterixis is noted.  Sensory: Pinprick and light touch intact throughout, bilaterally with decreased sensation in his feet to pain Deep Tendon Reflexes: (difficult to obtain due to body habitus) Right: Upper Extremity   Left: Upper extremity   biceps (C-5 to C-6) 1/4   biceps (C-5 to C-6) 1/4 tricep (C7) 1/4    triceps (C7) 1/4 Brachioradialis (C6) 2/4  Brachioradialis (C6) 2/4  Lower Extremity Lower Extremity  quadriceps (L-2 to L-4) 1/4   quadriceps (L-2 to L-4) 1/4 Achilles (S1) 0/4   Achilles (S1) 0/4  Plantars: Right: downgoing   Left: mute Cerebellar: Unable to assess Gait: not tested CV: pulses palpable throughout    No results found for this basename: cbc, bmp, coags, chol, tri, ldl, hga1c    Results for orders placed during the hospital encounter of 03/23/13 (from the past 48 hour(s))  GLUCOSE, CAPILLARY     Status: Abnormal   Collection Time    03/25/13 11:39 AM      Result Value Range   Glucose-Capillary 130 (*) 70 - 99 mg/dL  GLUCOSE, CAPILLARY      Status: Abnormal   Collection Time    03/25/13  3:56 PM      Result Value Range   Glucose-Capillary 118 (*) 70 - 99 mg/dL  GLUCOSE, CAPILLARY     Status: Abnormal   Collection Time    03/25/13  7:40 PM      Result Value Range   Glucose-Capillary 111 (*) 70 - 99 mg/dL  GLUCOSE, CAPILLARY     Status: Abnormal   Collection Time    03/25/13 11:56 PM      Result Value Range   Glucose-Capillary 184 (*) 70 - 99 mg/dL   Comment 1 Documented in Chart     Comment 2 Notify RN    GLUCOSE, CAPILLARY     Status: None   Collection Time    03/26/13  4:08 AM      Result Value Range   Glucose-Capillary 97  70 - 99 mg/dL  RENAL FUNCTION PANEL     Status: Abnormal   Collection Time    03/26/13  4:25 AM      Result Value Range   Sodium 130 (*) 135 - 145 mEq/L   Potassium 5.4 (*) 3.5 - 5.1 mEq/L   Chloride 91 (*) 96 - 112 mEq/L   CO2 22  19 - 32 mEq/L   Glucose, Bld 103 (*) 70 - 99 mg/dL   BUN 76 (*) 6 - 23 mg/dL   Creatinine, Ser 1.61 (*) 0.50 - 1.35 mg/dL   Calcium 8.9  8.4 - 09.6 mg/dL   Phosphorus 6.6 (*) 2.3 - 4.6 mg/dL   Albumin 2.6 (*) 3.5 - 5.2 g/dL   GFR calc non Af Amer 10 (*) >90 mL/min   GFR calc Af Amer 11 (*) >90 mL/min   Comment: (NOTE)  The eGFR has been calculated using the CKD EPI equation.     This calculation has not been validated in all clinical situations.     eGFR's persistently <90 mL/min signify possible Chronic Kidney     Disease.  CBC     Status: Abnormal   Collection Time    03/26/13  4:25 AM      Result Value Range   WBC 14.8 (*) 4.0 - 10.5 K/uL   RBC 2.91 (*) 4.22 - 5.81 MIL/uL   Hemoglobin 9.2 (*) 13.0 - 17.0 g/dL   HCT 16.1 (*) 09.6 - 04.5 %   MCV 97.9  78.0 - 100.0 fL   MCH 31.6  26.0 - 34.0 pg   MCHC 32.3  30.0 - 36.0 g/dL   RDW 40.9  81.1 - 91.4 %   Platelets 158  150 - 400 K/uL  GLUCOSE, CAPILLARY     Status: Abnormal   Collection Time    03/26/13  7:49 AM      Result Value Range   Glucose-Capillary 110 (*) 70 - 99 mg/dL  GLUCOSE,  CAPILLARY     Status: None   Collection Time    03/26/13 11:43 AM      Result Value Range   Glucose-Capillary 98  70 - 99 mg/dL  HEPATITIS B SURFACE ANTIGEN     Status: None   Collection Time    03/26/13 12:20 PM      Result Value Range   Hepatitis B Surface Ag NEGATIVE  NEGATIVE   Comment: Performed at [employer] [facility]  GLUCOSE, CAPILLARY     Status: Abnormal   Collection Time    03/26/13  5:45 PM      Result Value Range   Glucose-Capillary 103 (*) 70 - 99 mg/dL  GLUCOSE, CAPILLARY     Status: Abnormal   Collection Time    03/26/13  7:35 PM      Result Value Range   Glucose-Capillary 101 (*) 70 - 99 mg/dL  GLUCOSE, CAPILLARY     Status: Abnormal   Collection Time    03/27/13 12:02 AM      Result Value Range   Glucose-Capillary 114 (*) 70 - 99 mg/dL   Comment 1 Documented in Chart     Comment 2 Notify RN    GLUCOSE, CAPILLARY     Status: None   Collection Time    03/27/13  3:56 AM      Result Value Range   Glucose-Capillary 98  70 - 99 mg/dL   Comment 1 Notify RN     Comment 2 Documented in Chart    RENAL FUNCTION PANEL     Status: Abnormal   Collection Time    03/27/13  5:00 AM      Result Value Range   Sodium 133 (*) 135 - 145 mEq/L   Potassium 4.6  3.5 - 5.1 mEq/L   Chloride 96  96 - 112 mEq/L   CO2 21  19 - 32 mEq/L   Glucose, Bld 87  70 - 99 mg/dL   BUN 37 (*) 6 - 23 mg/dL   Comment: DELTA CHECK NOTED   Creatinine, Ser 3.78 (*) 0.50 - 1.35 mg/dL   Comment: DELTA CHECK NOTED   Calcium 9.0  8.4 - 10.5 mg/dL   Phosphorus 4.7 (*) 2.3 - 4.6 mg/dL   Albumin 2.7 (*) 3.5 - 5.2 g/dL   GFR calc non Af Amer 18 (*) >90 mL/min   GFR calc Af [name]  21 (*) >90 mL/min   Comment: (NOTE)     The eGFR has been calculated using the CKD EPI equation.     This calculation has not been validated in all clinical situations.     eGFR's persistently <90 mL/min signify possible Chronic Kidney     Disease.  CBC     Status: Abnormal   Collection Time    03/27/13  5:00 AM       Result Value Range   WBC 9.7  4.0 - 10.5 K/uL   RBC 2.56 (*) 4.22 - 5.81 MIL/uL   Hemoglobin 8.1 (*) 13.0 - 17.0 g/dL   HCT 16.1 (*) 09.6 - 04.5 %   MCV 98.0  78.0 - 100.0 fL   MCH 31.6  26.0 - 34.0 pg   MCHC 32.3  30.0 - 36.0 g/dL   RDW 40.9  81.1 - 91.4 %   Platelets 151  150 - 400 K/uL  GLUCOSE, CAPILLARY     Status: None   Collection Time    03/27/13  7:51 AM      Result Value Range   Glucose-Capillary 86  70 - 99 mg/dL  TROPONIN I     Status: None   Collection Time    03/27/13  9:09 AM      Result Value Range   Troponin I <0.30  <0.30 ng/mL   Comment:            Due to the release kinetics of cTnI,     a negative result within the first hours     of the onset of symptoms does not rule out     myocardial infarction with certainty.     If myocardial infarction is still suspected,     repeat the test at appropriate intervals.  PROCALCITONIN     Status: None   Collection Time    03/27/13  9:09 AM      Result Value Range   Procalcitonin 29.98     Comment:            Interpretation:     PCT >= 10 ng/mL:     Important systemic inflammatory response,     almost exclusively due to severe bacterial     sepsis or septic shock.     (NOTE)             ICU PCT Algorithm               Non ICU PCT Algorithm        ----------------------------     ------------------------------             PCT < 0.25 ng/mL                 PCT < 0.1 ng/mL         Stopping of antibiotics            Stopping of antibiotics           strongly encouraged.               strongly encouraged.        ----------------------------     ------------------------------           PCT level decrease by               PCT < 0.25 ng/mL           >= 80% from peak PCT  OR PCT 0.25 - 0.5 ng/mL          Stopping of antibiotics                                                 encouraged.         Stopping of antibiotics               encouraged.        ----------------------------      ------------------------------           PCT level decrease by              PCT >= 0.25 ng/mL           < 80% from peak PCT            AND PCT >= 0.5 ng/mL            Continuing antibiotics                                                  encouraged.           Continuing antibiotics                encouraged.        ----------------------------     ------------------------------         PCT level increase compared          PCT > 0.5 ng/mL             with peak PCT AND              PCT >= 0.5 ng/mL             Escalation of antibiotics                                              strongly encouraged.          Escalation of antibiotics            strongly encouraged.  POCT I-STAT 3, BLOOD GAS (G3+)     Status: Abnormal   Collection Time    03/27/13  9:23 AM      Result Value Range   pH, Arterial 7.247 (*) 7.350 - 7.450   pCO2 arterial 48.4 (*) 35.0 - 45.0 mmHg   pO2, Arterial 88.0  80.0 - 100.0 mmHg   Bicarbonate 21.2  20.0 - 24.0 mEq/L   TCO2 23  0 - 100 mmol/L   O2 Saturation 95.0     Acid-base deficit 6.0 (*) 0.0 - 2.0 mmol/L   Patient temperature 98.0 F     Collection site RADIAL, ALLEN'S TEST ACCEPTABLE     Drawn by Operator     Sample type ARTERIAL    HEPATIC FUNCTION PANEL     Status: Abnormal   Collection Time    03/27/13  9:30 AM      Result Value Range   Total Protein 7.4  6.0 - 8.3 g/dL   Albumin 2.8 (*) 3.5 - 5.2 g/dL   AST 10  0 -  37 U/L   ALT 7  0 - 53 U/L   Alkaline Phosphatase 181 (*) 39 - 117 U/L   Total Bilirubin 0.4  0.3 - 1.2 mg/dL   Bilirubin, Direct 0.2  0.0 - 0.3 mg/dL   Indirect Bilirubin 0.2 (*) 0.3 - 0.9 mg/dL  AMMONIA     Status: None   Collection Time    03/27/13  9:30 AM      Result Value Range   Ammonia 17  11 - 60 umol/L  PROTIME-INR     Status: None   Collection Time    03/27/13  9:30 AM      Result Value Range   Prothrombin Time 14.2  11.6 - 15.2 seconds   INR 1.12  0.00 - 1.49  LACTIC ACID, PLASMA     Status: None   Collection Time     03/27/13  9:30 AM      Result Value Range   Lactic Acid, Venous 0.6  0.5 - 2.2 mmol/L    Dg Chest Port 1 View  03/26/2013   CLINICAL DATA:  Pneumonia versus pulmonary edema. Shortness of breath.  EXAM: PORTABLE CHEST - 1 VIEW  COMPARISON:  03/25/2013  FINDINGS: Right central line remains in place, unchanged. Cardiomegaly. Bilateral perihilar airspace opacities. Increasing opacity in the left upper lobe and right lung base. Findings could represent asymmetric edema or infection. No visible effusions.  IMPRESSION: Bilateral airspace opacities, increasing since prior study, asymmetric edema versus infection.   Electronically Signed   By: Charlett Nose M.D.   On: 03/26/2013 09:16    Assessment and plan discussed with with attending physician and they are in agreement.    Felicie Morn PA-C [facility] [phone]  03/27/2013, 11:02 AM   I have seen and evaluated the patient. I have reviewed the above note and made appropriate changes.    Assessment/Plan:  47 year old male with likely multifactorial encephalopathy. Given the profound hypotension associated with his event of unresponsiveness and left-sided weakness, I feel that this was likely the culprit. An MRI to evaluate for ischemic damage as well as to assess for other causes of mental status change would be helpful. Also, given that he is waxing and waning, I would favor getting an EEG as well.  If the MRI shows a clearly small vessel or embolic infarct, then further stroke workup can be done.   1) MRI brain 2) EEG 3) lipid panel 4) TSH  Ritta Slot, MD [facility] [phone]  If 7pm- 7am, please page neurology on call at [phone].

## 2013-03-27 NOTE — Procedures (Signed)
History: 47 yo M with altered mental status and transient left sided weakness in the setting of hypotension.  Background: The Background consists of intermixed generalized high voltage delta and theta activities that are fairly disorganized. There are frequent frontally predominant triphasic waves with a shifting predominance. There are multiple episodes of isolated muscle jerks recorded with no EEG correlate.   EEG Abnormalities: 1)Triphasic waves 2) Disorganized irregular slow activity  Clinical Interpretation: This EEG is consistent with a moderate non-specific generalized cerebral dysfunction(encephalopathy). There was no seizure or seizure predisposition recorded on this study.   Ritta Slot, MD [facility] [phone]  If 7pm- 7am, please page neurology on call at [phone].

## 2013-03-28 ENCOUNTER — Inpatient Hospital Stay (HOSPITAL_COMMUNITY): Payer: Medicare Other

## 2013-03-28 LAB — GLUCOSE, CAPILLARY
Glucose-Capillary: 106 mg/dL — ABNORMAL HIGH (ref 70–99)
Glucose-Capillary: 74 mg/dL (ref 70–99)
Glucose-Capillary: 87 mg/dL (ref 70–99)
Glucose-Capillary: 91 mg/dL (ref 70–99)
Glucose-Capillary: 97 mg/dL (ref 70–99)

## 2013-03-28 LAB — POCT I-STAT 3, ART BLOOD GAS (G3+)
Bicarbonate: 17.4 mEq/L — ABNORMAL LOW (ref 20.0–24.0)
O2 Saturation: 98 %
Patient temperature: 97.6
TCO2: 19 mmol/L (ref 0–100)
pCO2 arterial: 36.6 mmHg (ref 35.0–45.0)
pH, Arterial: 7.283 — ABNORMAL LOW (ref 7.350–7.450)

## 2013-03-28 LAB — RENAL FUNCTION PANEL
CO2: 18 mEq/L — ABNORMAL LOW (ref 19–32)
Chloride: 96 mEq/L (ref 96–112)
Creatinine, Ser: 5.23 mg/dL — ABNORMAL HIGH (ref 0.50–1.35)
GFR calc Af Amer: 14 mL/min — ABNORMAL LOW (ref >90)
GFR calc non Af Amer: 12 mL/min — ABNORMAL LOW (ref >90)
Potassium: 4.9 mEq/L (ref 3.5–5.1)

## 2013-03-28 LAB — LIPID PANEL
Cholesterol: 156 mg/dL (ref 0–200)
Triglycerides: 226 mg/dL — ABNORMAL HIGH (ref <150)

## 2013-03-28 LAB — CBC
Hemoglobin: 8 g/dL — ABNORMAL LOW (ref 13.0–17.0)
RBC: 2.58 MIL/uL — ABNORMAL LOW (ref 4.22–5.81)
RDW: 14.8 % (ref 11.5–15.5)

## 2013-03-28 LAB — PROCALCITONIN: Procalcitonin: 23.03 ng/mL

## 2013-03-28 MED ORDER — ALTEPLASE 2 MG IJ SOLR
2.0000 mg | Freq: Once | INTRAMUSCULAR | Status: AC | PRN
  Filled 2013-03-28: qty 2

## 2013-03-28 MED ORDER — NEPRO/CARBSTEADY PO LIQD: 237.0000 mL | ORAL | Status: DC | PRN

## 2013-03-28 MED ORDER — LIDOCAINE HCL (PF) 1 % IJ SOLN: 5.0000 mL | INTRAMUSCULAR | Status: DC | PRN

## 2013-03-28 MED ORDER — LIDOCAINE-PRILOCAINE 2.5-2.5 % EX CREA: 1.0000 "application " | TOPICAL_CREAM | CUTANEOUS | Status: DC | PRN

## 2013-03-28 MED ORDER — DARBEPOETIN ALFA-POLYSORBATE 150 MCG/0.3ML IJ SOLN
INTRAMUSCULAR | Status: AC
  Administered 2013-03-28: 150 ug via INTRAVENOUS
  Filled 2013-03-28: qty 0.3

## 2013-03-28 MED ORDER — PENTAFLUOROPROP-TETRAFLUOROETH EX AERO: 1.0000 "application " | INHALATION_SPRAY | CUTANEOUS | Status: DC | PRN

## 2013-03-28 MED ORDER — SODIUM CHLORIDE 0.9 % IV SOLN: 100.0000 mL | INTRAVENOUS | Status: DC | PRN

## 2013-03-28 MED ORDER — HEPARIN SODIUM (PORCINE) 1000 UNIT/ML DIALYSIS
1000.0000 [IU] | INTRAMUSCULAR | Status: DC | PRN
  Filled 2013-03-28: qty 1

## 2013-03-28 MED ORDER — HEPARIN SODIUM (PORCINE) 1000 UNIT/ML DIALYSIS
20.0000 [IU]/kg | INTRAMUSCULAR | Status: DC | PRN
  Filled 2013-03-28: qty 3

## 2013-03-28 NOTE — Progress Notes (Signed)
PULMONARY  / CRITICAL CARE MEDICINE  Name: [name] MRN: [id number] DOB: 26-Nov-1965    ADMISSION DATE:  03/23/2013  REFERRING MD :  Gerhard Munch  CHIEF COMPLAINT:  Cant breath  BRIEF PATIENT DESCRIPTION:  47 y/o male from dialysis center with dyspnea, hypoxia, and cough with yellow sputum.  Found to have PNA.  PCCM asked to admit to ICU. He has ESRD and on daily midodrine  SIGNIFICANT EVENTS/Studies: 10/25 - Admit to ICU, renal consulted 10/26 - levo gtt, bipap 10/27 - levo weaned, intermittent BiPAP + bipap qhs (desat on cpap) 10/28 - episode of dysarthria and decreased alertness after HD that resolved with fluid bolus 10/29 CT head>>>neg acute 10/29 eeg>>> disorganized tricphasic waves  LINES / TUBES: Rt IJ CVL 10/26 >> Aline 10/26 >>out  CULTURES: Blood 10/25 >> not collected Sputum 10/25 >> not collected Influenza 10/25 >> negative  MRSA PCR 10/25>> negative ............ Blood culture 10/29 >  ANTIBIOTICS: Azactam 10/25 >>10/28 Vancomycin 10/25 >>10/28 Levaquin 10/25 >> Tamiflu 10/25 >> 10/26  SUBJECTIVE: O x 1 remains, O2 needs high flow  VITAL SIGNS: Temp:  [97.6 F (36.4 C)-98.6 F (37 C)] 97.6 F (36.4 C) (10/30 0748) Pulse Rate:  [89-103] 96 (10/30 0800) Resp:  [12-19] 17 (10/30 0800) BP: (94-134)/(42-94) 125/54 mmHg (10/30 0800) SpO2:  [87 %-100 %] 87 % (10/30 0800) FiO2 (%):  [35 %-50 %] 35 % (10/30 0400) Weight:  [166.8 kg (367 lb 11.6 oz)] 166.8 kg (367 lb 11.6 oz) (10/30 0417) VM  INTAKE / OUTPUT: Intake/Output     10/29 0701 - 10/30 0700 10/30 0701 - 10/31 0700   I.V. (mL/kg)     IV Piggyback 100    Total Intake(mL/kg) 100 (0.6)    Other     Total Output       Net +100            PHYSICAL EXAMINATION: General: morbidly obese male lying in bed no distress, lethargic Neuro:  lethargic  Ox 1, left foot drop, otherwise moves ext  equally HEENT:  Pupils reactive Cardiovascular:  s1 s2 rrr Lungs: coarse Abdomen:  Obese, mild  tenderness throughout to palpation Musculoskeletal:  trace to 1+ edema Skin:  No rashes  LABS: PULMONARY  Recent Labs Lab 03/23/13 1630 03/23/13 2051 03/24/13 0407 03/27/13 0923  PHART 7.398 7.351 7.284* 7.247*  PCO2ART 38.4 39.8 48.4* 48.4*  PO2ART 65.0* 86.0 150.0* 88.0  HCO3 23.7 22.0 22.2 21.2  TCO2 25 23 23.7 23  O2SAT 92.0 96.0 97.6 95.0    CBC  Recent Labs Lab 03/27/13 0500 03/27/13 2154 03/28/13 0500  HGB 8.1* 7.7* 8.0*  HCT 25.1* 24.3* 25.1*  WBC 9.7 10.7* 12.6*  PLT 151 146* 147*    COAGULATION  Recent Labs Lab 03/27/13 0930  INR 1.12    CARDIAC    Recent Labs Lab 03/27/13 0909 03/27/13 1500 03/27/13 2109  TROPONINI <0.30 <0.30 <0.30    Recent Labs Lab 03/23/13 1353  PROBNP 7725.0*     CHEMISTRY  Recent Labs Lab 03/24/13 0500 03/25/13 0400 03/26/13 0425 03/27/13 0500 03/28/13 0500  NA 133* 132* 130* 133* 133*  K 5.0 4.9 5.4* 4.6 4.9  CL 92* 92* 91* 96 96  CO2 22 22 22 21  18*  GLUCOSE 180* 92 103* 87 88  BUN 41* 61* 76* 37* 59*  CREATININE 4.04* 5.16* 6.24* 3.78* 5.23*  CALCIUM 9.6 9.2 8.9 9.0 9.2  PHOS 5.2*  --  6.6* 4.7* 5.6*   The CrCl is  unknown because both a height and weight (above a minimum accepted value) are required for this calculation.   LIVER  Recent Labs Lab 03/23/13 1353 03/24/13 0500 03/26/13 0425 03/27/13 0500 03/27/13 0930 03/28/13 0500  AST 18  --   --   --  10  --   ALT 16  --   --   --  7  --   ALKPHOS 279*  --   --   --  181*  --   BILITOT 0.5  --   --   --  0.4  --   PROT 9.1*  --   --   --  7.4  --   ALBUMIN 3.5 3.0* 2.6* 2.7* 2.8* 2.7*  INR  --   --   --   --  1.12  --      INFECTIOUS  Recent Labs Lab 03/23/13 1406 03/23/13 1659 03/27/13 0909 03/27/13 0930 03/28/13 0500  LATICACIDVEN 2.78* 1.70  --  0.6  --   PROCALCITON  --   --  29.98  --  23.03     ENDOCRINE CBG (last 3)   Recent Labs  03/28/13 0003 03/28/13 0416 03/28/13 0747  GLUCAP 74 91 87    IMAGING  x48h  Ct Head Wo Contrast  03/27/2013   CLINICAL DATA:  Altered mental status, multiple sclerosis with anterior a shin. Mild chronic twitching.  EXAM: CT HEAD WITHOUT CONTRAST  TECHNIQUE: Contiguous axial images were obtained from the base of the skull through the vertex without intravenous contrast.  COMPARISON:  06/04/2012.  FINDINGS: No evidence of an acute infarct, acute hemorrhage, mass lesion, mass effect or hydrocephalus. Atrophy. Minimal periventricular low attenuation. Remote lacunar infarct or a dilated perivascular space in the left basal ganglia. Retention cysts or polyps in the maxillary sinuses. Scattered mucosal thickening in the paranasal sinuses. Mastoid air cells are clear.  IMPRESSION: 1. No acute intracranial abnormality. 2. Mild atrophy and minimal chronic microvascular white matter ischemic changes.   Electronically Signed   By: Leanna Battles M.D.   On: 03/27/2013 14:33   Dg Abd Portable 1v  03/27/2013   CLINICAL DATA:  Nasogastric tube placement.  EXAM: PORTABLE ABDOMEN - 1 VIEW  COMPARISON:  None.  FINDINGS: Portable motion degraded examination.  Tip of the nasogastric tube gastric fundus level. The side hole is at the level of the gastroesophageal junction. Recommend advancing the nasogastric tube 3-5 cm.  Left base consolidation.  Cardiomegaly.  Nonspecific bowel gas pattern.  The possibility of free intraperitoneal air cannot be assessed on a supine view. .  IMPRESSION: Tip of the nasogastric tube gastric fundus level. The side hole is at the level of the gastroesophageal junction. Recommend advancing the nasogastric tube 3-5 cm.  Please see above.  This is a call report.   Electronically Signed   By: Bridgett Larsson M.D.   On: 03/27/2013 11:33    ASSESSMENT / PLAN:  PULMONARY A: Acute respiratory failure from volume overload and probable PNA LL PNA Respiratory Acidosis - hypercarbia Prior Chronic critical illness., trch x 2 in 2010 following abdominal sepsis At risk for  OSA; not on CPAP Likely OHS  P:   BiPAP noted , now off, may limit use with neurostatus If neurostatus ok with NIMV will then schedule 4 hrs on 1 hr off Repeat abg May require ett then early trach? oxygen to keep SpO2 > 90% prn albuterol pcxr now, re eval infiltrates  CARDIOVASCULAR A:  SIRS/sepsis - lactic acid clearing  Hx of hypotension on midodrine as outpt P:  monitor hemodynamics with HD today planned Midodrine for sbp > 90 May need pressors during hd  RENAL A:   ESRD- TTS HD Hyponatremia P:   For hd today per renal Chem in am   GASTROINTESTINAL A:   Morbid obesity >> wife reports he has lost almost 500 lbs since gastric sleeve in 2010. At risk for NASH - Cirrhosis but no clinical stigmata of cirrhosis and not reported in Imaging 2012-2014 P:   Npo due to mental status Protonix for SUP May Tf started, pending resp status and BIPAP use?  HEMATOLOGIC A:   Leukocytosis- trending down Anemia of chronic and critical illness P:  PRBC for hgb < 7gm% only SQ heparin for DVT prevention  INFECTIOUS A:    HCAP >> bacterial vs viral (flu negative). High risk for MDR. Rx handicapped by fact no cutlures sent P:   Continue Levaquin for now pcxr now re eval infiltrates If ett would consider BAl bronch  ENDOCRINE A:   Hyperglycemia.   P:   SSI  NEUROLOGIC A:   Acute encephalopathy - multifactorial with hypotension and possible component of ICU delirium with rapid waxing and waning Myoclonic jerking- possible RLS vs SSRI withdrawal vs medication effect  P:   monitor mental status hold outpt lexapro, roxicodone for now until MS clears  Treat resp acidosis CT and eeg reviewed  Family updated  The patient is critically ill with multiple organ systems failure and requires high complexity decision making for assessment and support, frequent evaluation and titration of therapies, application of advanced monitoring technologies and extensive interpretation of  multiple databases.   Critical Care Time devoted to patient care services described in this note is  35  Minutes.  Mcarthur Rossetti. Tyson Alias, MD, FACP Pgr: [phone]

## 2013-03-28 NOTE — Progress Notes (Signed)
assessment:  1 pna right lower lobe  2 esrd cont hd tts  3 anemia of ckd-  4 encephalopathy 5 chronichypotension/volume-  6 morbid obesity, s/p gastric sleeve   plan- next hd today, 4 hrs 30 min,   Subjective: Interval History: Neuro consult & WU done  Objective: Vital signs in last 24 hours: Temp:  [97.6 F (36.4 C)-98.6 F (37 C)] 97.6 F (36.4 C) (10/30 0748) Pulse Rate:  [89-103] 96 (10/30 0800) Resp:  [12-20] 17 (10/30 0800) BP: (94-134)/(42-94) 125/54 mmHg (10/30 0800) SpO2:  [87 %-100 %] 87 % (10/30 0800) FiO2 (%):  [35 %-50 %] 35 % (10/30 0400) Weight:  [166.8 kg (367 lb 11.6 oz)] 166.8 kg (367 lb 11.6 oz) (10/30 0417) Weight change:   Intake/Output from previous day: 10/29 0701 - 10/30 0700 In: 100 [IV Piggyback:100] Out: -  Intake/Output this shift:    General appearance: cooperative GI: soft, non-tender; bowel sounds normal; no masses,  no organomegaly and protuberant and obese Extremities: edema confused, perseverating about [place], not [facility] or [place] minimal myoclonus today  Lab Results:  Recent Labs  03/27/13 2154 03/28/13 0500  WBC 10.7* 12.6*  HGB 7.7* 8.0*  HCT 24.3* 25.1*  PLT 146* 147*   BMET:  Recent Labs  03/27/13 0500 03/28/13 0500  NA 133* 133*  K 4.6 4.9  CL 96 96  CO2 21 18*  GLUCOSE 87 88  BUN 37* 59*  CREATININE 3.78* 5.23*  CALCIUM 9.0 9.2   No results found for this basename: PTH,  in the last 72 hours Iron Studies: No results found for this basename: IRON, TIBC, TRANSFERRIN, FERRITIN,  in the last 72 hours Studies/Results: Ct Head Wo Contrast  03/27/2013   CLINICAL DATA:  Altered mental status, multiple sclerosis with anterior a shin. Mild chronic twitching.  EXAM: CT HEAD WITHOUT CONTRAST  TECHNIQUE: Contiguous axial images were obtained from the base of the skull through the vertex without intravenous contrast.  COMPARISON:  06/04/2012.  FINDINGS: No evidence of an acute infarct, acute hemorrhage,  mass lesion, mass effect or hydrocephalus. Atrophy. Minimal periventricular low attenuation. Remote lacunar infarct or a dilated perivascular space in the left basal ganglia. Retention cysts or polyps in the maxillary sinuses. Scattered mucosal thickening in the paranasal sinuses. Mastoid air cells are clear.  IMPRESSION: 1. No acute intracranial abnormality. 2. Mild atrophy and minimal chronic microvascular white matter ischemic changes.   Electronically Signed   By: Leanna Battles M.D.   On: 03/27/2013 14:33   Dg Chest Port 1 View  03/26/2013   CLINICAL DATA:  Pneumonia versus pulmonary edema. Shortness of breath.  EXAM: PORTABLE CHEST - 1 VIEW  COMPARISON:  03/25/2013  FINDINGS: Right central line remains in place, unchanged. Cardiomegaly. Bilateral perihilar airspace opacities. Increasing opacity in the left upper lobe and right lung base. Findings could represent asymmetric edema or infection. No visible effusions.  IMPRESSION: Bilateral airspace opacities, increasing since prior study, asymmetric edema versus infection.   Electronically Signed   By: Charlett Nose M.D.   On: 03/26/2013 09:16   Dg Abd Portable 1v  03/27/2013   CLINICAL DATA:  Nasogastric tube placement.  EXAM: PORTABLE ABDOMEN - 1 VIEW  COMPARISON:  None.  FINDINGS: Portable motion degraded examination.  Tip of the nasogastric tube gastric fundus level. The side hole is at the level of the gastroesophageal junction. Recommend advancing the nasogastric tube 3-5 cm.  Left base consolidation.  Cardiomegaly.  Nonspecific bowel gas pattern.  The  possibility of free intraperitoneal air cannot be assessed on a supine view. .  IMPRESSION: Tip of the nasogastric tube gastric fundus level. The side hole is at the level of the gastroesophageal junction. Recommend advancing the nasogastric tube 3-5 cm.  Please see above.  This is a call report.   Electronically Signed   By: Bridgett Larsson M.D.   On: 03/27/2013 11:33   Scheduled: . antiseptic oral  rinse  15 mL Mouth Rinse q12n4p  . calcium acetate  2,668 mg Oral TID WC  . chlorhexidine  15 mL Mouth Rinse BID  . darbepoetin (ARANESP) injection - DIALYSIS  150 mcg Intravenous Q Thu-HD  . heparin subcutaneous  5,000 Units Subcutaneous Q8H  . insulin aspart  0-9 Units Subcutaneous Q4H  . levofloxacin (LEVAQUIN) IV  500 mg Intravenous Q48H  . midodrine  10 mg Oral TID AC  . multivitamin  1 tablet Oral QHS  . pantoprazole (PROTONIX) IV  40 mg Intravenous QHS     LOS: 5 days   [name] C 03/28/2013,8:40 AM

## 2013-03-28 NOTE — Progress Notes (Signed)
Subjective: Soemwhat improved overnight.   Exam: Filed Vitals:   03/28/13 1121  BP:   Pulse: 89  Temp:   Resp: 17   Gen: In bed, NAD MS: Awakens to voice, able to tell me hospital, but not which one. Able to identify sister.  AO:ZHYQM, VFF Motor: moves both arms well to command, right leg appears to hav egood strength, left leg with good proximal strength, but left foot drop(old).  Sensory:decreased over left foot   Impression: 47 year old male with multifactorial encephalopathy. Given the profound hypotension associated with his event of unresponsiveness and left-sided weakness, I feel that this was likely the culprit. The jerking movements were captured on EEG and are not epileptic. I suspect myoclonus secondary to metabolic multifactorial encephalopathy  MRI unable to be done due to habitus, but CT was negative.   Recommendations: 1) Will continue to follow.   Ritta Slot, MD [facility] [phone]  If 7pm- 7am, please page neurology on call at [phone].

## 2013-03-29 ENCOUNTER — Inpatient Hospital Stay (HOSPITAL_COMMUNITY): Payer: Medicare Other

## 2013-03-29 DIAGNOSIS — D689 Coagulation defect, unspecified: Secondary | ICD-10-CM

## 2013-03-29 LAB — COMPREHENSIVE METABOLIC PANEL
ALT: 7 U/L (ref 0–53)
AST: 9 U/L (ref 0–37)
Albumin: 2.7 g/dL — ABNORMAL LOW (ref 3.5–5.2)
BUN: 32 mg/dL — ABNORMAL HIGH (ref 6–23)
CO2: 23 mEq/L (ref 19–32)
Chloride: 97 mEq/L (ref 96–112)
Creatinine, Ser: 3.35 mg/dL — ABNORMAL HIGH (ref 0.50–1.35)
GFR calc non Af Amer: 21 mL/min — ABNORMAL LOW (ref >90)
Potassium: 3.8 mEq/L (ref 3.5–5.1)
Total Bilirubin: 0.4 mg/dL (ref 0.3–1.2)
Total Protein: 7.4 g/dL (ref 6.0–8.3)

## 2013-03-29 LAB — POCT I-STAT 3, ART BLOOD GAS (G3+)
Acid-base deficit: 2 mmol/L (ref 0.0–2.0)
Bicarbonate: 22.9 mEq/L (ref 20.0–24.0)
O2 Saturation: 97 %
pCO2 arterial: 40.5 mmHg (ref 35.0–45.0)
pH, Arterial: 7.36 (ref 7.350–7.450)
pO2, Arterial: 97 mmHg (ref 80.0–100.0)

## 2013-03-29 LAB — CBC WITH DIFFERENTIAL/PLATELET
Basophils Absolute: 0 10*3/uL (ref 0.0–0.1)
Eosinophils Absolute: 0.1 10*3/uL (ref 0.0–0.7)
Lymphocytes Relative: 10 % — ABNORMAL LOW (ref 12–46)
Lymphs Abs: 0.9 10*3/uL (ref 0.7–4.0)
MCH: 30.8 pg (ref 26.0–34.0)
Neutrophils Relative %: 80 % — ABNORMAL HIGH (ref 43–77)
Platelets: 153 10*3/uL (ref 150–400)
RBC: 2.63 MIL/uL — ABNORMAL LOW (ref 4.22–5.81)
RDW: 15 % (ref 11.5–15.5)
WBC: 9.4 10*3/uL (ref 4.0–10.5)

## 2013-03-29 LAB — GLUCOSE, CAPILLARY
Glucose-Capillary: 109 mg/dL — ABNORMAL HIGH (ref 70–99)
Glucose-Capillary: 114 mg/dL — ABNORMAL HIGH (ref 70–99)
Glucose-Capillary: 125 mg/dL — ABNORMAL HIGH (ref 70–99)
Glucose-Capillary: 114 mg/dL — ABNORMAL HIGH (ref 70–99)

## 2013-03-29 LAB — PROCALCITONIN: Procalcitonin: 18.01 ng/mL

## 2013-03-29 MED ORDER — NEPRO/CARBSTEADY PO LIQD
1000.0000 mL | ORAL | Status: DC
  Filled 2013-03-29 (×6): qty 1000

## 2013-03-29 MED ORDER — LIDOCAINE HCL (PF) 1 % IJ SOLN: 5.0000 mL | INTRAMUSCULAR | Status: DC | PRN

## 2013-03-29 MED ORDER — PENTAFLUOROPROP-TETRAFLUOROETH EX AERO: 1.0000 "application " | INHALATION_SPRAY | CUTANEOUS | Status: DC | PRN

## 2013-03-29 MED ORDER — HEPARIN SODIUM (PORCINE) 1000 UNIT/ML DIALYSIS
20.0000 [IU]/kg | INTRAMUSCULAR | Status: DC | PRN
  Filled 2013-03-29: qty 4

## 2013-03-29 MED ORDER — VITAL AF 1.2 CAL PO LIQD
1000.0000 mL | ORAL | Status: DC
  Filled 2013-03-29: qty 1000

## 2013-03-29 MED ORDER — SODIUM CHLORIDE 0.9 % IV SOLN: 100.0000 mL | INTRAVENOUS | Status: DC | PRN

## 2013-03-29 MED ORDER — NEPRO/CARBSTEADY PO LIQD: 237.0000 mL | ORAL | Status: DC | PRN

## 2013-03-29 MED ORDER — WHITE PETROLATUM GEL
Status: AC
  Administered 2013-03-29: 10:00:00
  Filled 2013-03-29: qty 5

## 2013-03-29 MED ORDER — ALTEPLASE 2 MG IJ SOLR
2.0000 mg | Freq: Once | INTRAMUSCULAR | Status: AC | PRN
  Filled 2013-03-29: qty 2

## 2013-03-29 MED ORDER — ESCITALOPRAM OXALATE 20 MG PO TABS
20.0000 mg | ORAL_TABLET | Freq: Every day | ORAL | Status: DC
  Administered 2013-03-29 – 2013-04-02 (×5): 20 mg via ORAL
  Filled 2013-03-29 (×6): qty 1

## 2013-03-29 MED ORDER — LIDOCAINE-PRILOCAINE 2.5-2.5 % EX CREA: 1.0000 "application " | TOPICAL_CREAM | CUTANEOUS | Status: DC | PRN

## 2013-03-29 MED ORDER — HEPARIN SODIUM (PORCINE) 1000 UNIT/ML DIALYSIS: 1000.0000 [IU] | INTRAMUSCULAR | Status: DC | PRN

## 2013-03-29 NOTE — Progress Notes (Signed)
ANTIBIOTIC CONSULT NOTE - FOLLOW UP  Pharmacy Consult for Levaquin  Indication: pneumonia  Allergies  Allergen Reactions  . Piperacillin Sod-Tazobactam So Hives    Patient Measurements: Weight: 147.4 kg  Vital Signs: Temp: 98 F (36.7 C) (10/31 0803) Temp src: Oral (10/31 0803) BP: 111/56 mmHg (10/31 0600) Pulse Rate: 92 (10/31 0820) Intake/Output from previous day: 10/30 0701 - 10/31 0700 In: 130 [NG/GT:130] Out: 1724  Intake/Output from this shift:    Labs:  Recent Labs  03/27/13 0500 03/27/13 2154 03/28/13 0500 03/29/13 0500  WBC 9.7 10.7* 12.6* 9.4  HGB 8.1* 7.7* 8.0* 8.1*  PLT 151 146* 147* 153  CREATININE 3.78*  --  5.23* 3.35*   The CrCl is unknown because both a height and weight (above a minimum accepted value) are required for this calculation. No results found for this basename: VANCOTROUGH, VANCOPEAK, VANCORANDOM, GENTTROUGH, GENTPEAK, GENTRANDOM, TOBRATROUGH, TOBRAPEAK, TOBRARND, AMIKACINPEAK, AMIKACINTROU, AMIKACIN,  in the last 72 hours     Assessment: 47 year old male w/ESRD (HD TTS) admitted from his HD session with AMS and hypoxia. Levaquin continues for PNA PNA.  Patient continues on HD with last HD session on 03/28/13.   Azactam 10/25 >> 10/28 Vancomycin 10/25 >> 10/28 Levaquin 10/25 >>  Tamiflu 10/25 >> 10/26  Blood x2 10/29 >> ngtd Flu PCR 10/25: neg MRSA PCR 10/25: neg  Plan:  - Continue Levaquin 500mg  IV q48hr -Will follow final cultures and patient progress  Harland German, Pharm D 03/29/2013 8:34 AM

## 2013-03-29 NOTE — Care Management Note (Signed)
    Page 1 of 2   04/02/2013     2:40:13 PM   CARE MANAGEMENT NOTE 04/02/2013  Patient:  [name], [name]   Account Number:  [id number]  Date Initiated:  03/27/2013  Documentation initiated by:  Avie Arenas  Subjective/Objective Assessment:   Resp failure requiring bipap and pressors.     DC Planning Services  CM consult      Choice offered to / List presented to:  C-1 Patient   DME arranged  BIPAP      DME agency  [facility]    HH arranged  HH-2 PT      [facility] agency  [facility]   Status of service:  Completed, signed off  Discharge Disposition:  HOME W HOME HEALTH SERVICES  Per UR Regulation:  Reviewed for med. necessity/level of care/duration of stay  If discussed at Long Length of Stay Meetings, dates discussed:   03/28/2013  04/02/2013   Comments:  ContactMalikiah, Debarr Spouse [phone]                 Ickes,Cammie Sister [phone]  04/02/13 1139 Kemoni Ortega RN MSN BSN CCM PT recommends home health therapy.  List of agencies provided, referral made per choice.  Pt to d/c on BiPAP @ night, sleep study arranged for 05/02/13.  04/01/13 1351 Christiano Blandon RN MSN BSN CCM Pt to d/c home with spouse, pt and spouse request home health PT for strengthening.  PT eval pending.  03-29-13 11:30am Avie Arenas, RNBSN [phone] patient more alert. Sister, Cammie at bedside.  Talked with patient and sister at bedside.  Patient adamently refused to go to either [facility].  Has had HH at home with [facility] and has BSC, WC - electric, Walker, shower seat.  Does not know of any other needs at home. physician would like patient to go to an [facility] also.  Discussed with patient and sister also and again patient does not want to go.  Physician would like to reeval on monday.  Plan for tx out to intermediate unit today. PT ordered.   03-28-13 11:10am Avie Arenas, RNBSN [phone] Patient has been at [facility] before - now back on bipap. Oriented to name  only. Not ready for [facility] tx but [facility] will take back.  CM will continue to follow.  has also been at [facility] and [facility] is offering a bed also.  03-27-13 10:30am Avie Arenas, RNBSN [phone] [facility] candidate.  Discussed with physician - referral made.

## 2013-03-29 NOTE — Progress Notes (Signed)
assessment:  1 pna right lower lobe  2 esrd cont hd tts  3 anemia of ckd-  4 encephalopathy, resolved  5 chronic hypotension/volume-xs  6 morbid obesity, s/p gastric sleeve  plan- next hd Saturday, 4 hrs 30 min,    Subjective: Interval History: Improved  Objective: Vital signs in last 24 hours: Temp:  [97.6 F (36.4 C)-98.7 F (37.1 C)] 98 F (36.7 C) (10/31 0803) Pulse Rate:  [85-111] 92 (10/31 0820) Resp:  [14-23] 17 (10/31 0820) BP: (55-123)/(23-73) 111/56 mmHg (10/31 0600) SpO2:  [89 %-100 %] 95 % (10/31 0820) FiO2 (%):  [40 %] 40 % (10/31 0820) Weight change:   Intake/Output from previous day: 10/30 0701 - 10/31 0700 In: 130 [NG/GT:130] Out: 1724  Intake/Output this shift:    General appearance: alert and cooperative GI: obese Extremities: edema 2+  Lab Results:  Recent Labs  03/28/13 0500 03/29/13 0500  WBC 12.6* 9.4  HGB 8.0* 8.1*  HCT 25.1* 25.6*  PLT 147* 153   BMET:  Recent Labs  03/28/13 0500 03/29/13 0500  NA 133* 139  K 4.9 3.8  CL 96 97  CO2 18* 23  GLUCOSE 88 104*  BUN 59* 32*  CREATININE 5.23* 3.35*  CALCIUM 9.2 9.5   No results found for this basename: PTH,  in the last 72 hours Iron Studies: No results found for this basename: IRON, TIBC, TRANSFERRIN, FERRITIN,  in the last 72 hours Studies/Results: Ct Head Wo Contrast  03/27/2013   CLINICAL DATA:  Altered mental status, multiple sclerosis with anterior a shin. Mild chronic twitching.  EXAM: CT HEAD WITHOUT CONTRAST  TECHNIQUE: Contiguous axial images were obtained from the base of the skull through the vertex without intravenous contrast.  COMPARISON:  06/04/2012.  FINDINGS: No evidence of an acute infarct, acute hemorrhage, mass lesion, mass effect or hydrocephalus. Atrophy. Minimal periventricular low attenuation. Remote lacunar infarct or a dilated perivascular space in the left basal ganglia. Retention cysts or polyps in the maxillary sinuses. Scattered mucosal thickening in  the paranasal sinuses. Mastoid air cells are clear.  IMPRESSION: 1. No acute intracranial abnormality. 2. Mild atrophy and minimal chronic microvascular white matter ischemic changes.   Electronically Signed   By: Leanna Battles M.D.   On: 03/27/2013 14:33   Dg Chest Port 1 View  03/29/2013   CLINICAL DATA:  Infiltrates.  EXAM: PORTABLE CHEST - 1 VIEW  COMPARISON:  One-view chest 03/28/2013.  FINDINGS: The NG tube has been advanced. The side port is now beyond the film. The right IJ line is stable. The patient is rotated to the left. The heart is enlarged. Bilateral lower lobe airspace disease is unchanged. Moderate pulmonary vascular congestion is again noted.  IMPRESSION: 1. Persistent bibasilar airspace disease, left greater than right. 2. Moderate pulmonary vascular congestion is unchanged. 3. The NG tube has been advanced. The side port is now below the inferior border of the film.   Electronically Signed   By: Gennette Pac M.D.   On: 03/29/2013 07:22   Dg Chest Port 1 View  03/28/2013   CLINICAL DATA:  assess infiltrates  EXAM: PORTABLE CHEST - 1 VIEW  COMPARISON:  DG CHEST 1V PORT dated 03/26/2013  FINDINGS: Interval placement of NG tube which follows the course of the esophagus with tip below the edge of the film. The side port is just above the GE junction. Stable enlarged cardiac silhouette. There is bibasilar airspace disease is slightly improved. No pneumothorax.  IMPRESSION: 1. NG tube within  the stomach. Side port is just above the GE junction. Consider advancement by 6-8 cm such that the side port is below the GE junction. 2. Improvement in pulmonary edema pattern.   Electronically Signed   By: Genevive Bi M.D.   On: 03/28/2013 10:08   Dg Abd Portable 1v  03/27/2013   CLINICAL DATA:  Nasogastric tube placement.  EXAM: PORTABLE ABDOMEN - 1 VIEW  COMPARISON:  None.  FINDINGS: Portable motion degraded examination.  Tip of the nasogastric tube gastric fundus level. The side hole is at  the level of the gastroesophageal junction. Recommend advancing the nasogastric tube 3-5 cm.  Left base consolidation.  Cardiomegaly.  Nonspecific bowel gas pattern.  The possibility of free intraperitoneal air cannot be assessed on a supine view. .  IMPRESSION: Tip of the nasogastric tube gastric fundus level. The side hole is at the level of the gastroesophageal junction. Recommend advancing the nasogastric tube 3-5 cm.  Please see above.  This is a call report.   Electronically Signed   By: Bridgett Larsson M.D.   On: 03/27/2013 11:33   Scheduled: . antiseptic oral rinse  15 mL Mouth Rinse q12n4p  . calcium acetate  2,668 mg Oral TID WC  . chlorhexidine  15 mL Mouth Rinse BID  . darbepoetin (ARANESP) injection - DIALYSIS  150 mcg Intravenous Q Thu-HD  . heparin subcutaneous  5,000 Units Subcutaneous Q8H  . insulin aspart  0-9 Units Subcutaneous Q4H  . levofloxacin (LEVAQUIN) IV  500 mg Intravenous Q48H  . midodrine  10 mg Oral TID AC  . multivitamin  1 tablet Oral QHS  . pantoprazole (PROTONIX) IV  40 mg Intravenous QHS     LOS: 6 days   Jaquesha Boroff C 03/29/2013,8:28 AM

## 2013-03-29 NOTE — Progress Notes (Signed)
INITIAL NUTRITION ASSESSMENT  DOCUMENTATION CODES Per approved criteria  -Morbid Obesity   INTERVENTION:  Initiate TF via NGT with Nepro at 20 ml/h, increase by 10 ml every 4 hours to goal rate of 55 ml/h to provide 2376 kcals, 107 gm protein, 960 ml free water daily.  NUTRITION DIAGNOSIS: Inadequate oral intake related to inability to eat as evidenced by NPO status.   Goal: Intake to meet >90% of estimated nutrition needs.  Monitor:  TF tolerance/adequacy, swallowing function and diet advancement, weight trend, labs, respiratory status.  Reason for Assessment: MD Consult for TF initiation and management.  47 y.o. male  Admitting Dx: PNA  ASSESSMENT: Patient admitted from dialysis center with dyspnea, hypoxia, and cough with yellow sputum. Found to have PNA. Has been on and off of BiPAP since admission. Patient has been NPO since admission due to difficulty breathing. Currently receiving oxygen via venti-mask. Plans for a swallow evaluation and initiation of TF today. Nutrition focused physical exam completed.  No muscle or subcutaneous fat depletion noticed.  Height: Ht Readings from Last 1 Encounters:  10/22/12 6\' 1"  (1.854 m)    Weight: Wt Readings from Last 1 Encounters:  03/28/13 367 lb 11.6 oz (166.8 kg)    Ideal Body Weight: 83.6 kg  % Ideal Body Weight: 200%  Wt Readings from Last 10 Encounters:  03/28/13 367 lb 11.6 oz (166.8 kg)  11/15/12 317 lb (143.79 kg)  10/22/12 317 lb (143.79 kg)  06/12/12 310 lb 13.6 oz (141 kg)  04/06/12 352 lb 8.3 oz (159.9 kg)  09/10/11 358 lb 7.5 oz (162.6 kg)  09/01/11 334 lb 10.5 oz (151.8 kg)  09/01/11 334 lb 10.5 oz (151.8 kg)    Usual Body Weight: 317 lb  % Usual Body Weight: 116% (likely up with fluid retention)  BMI:  Body mass index is 48.53 kg/(m^2). class 3, extreme/morbid obesity  Estimated Nutritional Needs: Kcal: 2200-2400 Protein: 90-120 gm Fluid: 1.2 L  Skin: stage 1 sacral pressure ulcer  Diet  Order: NPO  EDUCATION NEEDS: -Education not appropriate at this time   Intake/Output Summary (Last 24 hours) at 03/29/13 1132 Last data filed at 03/29/13 0900  Gross per 24 hour  Intake    130 ml  Output   1724 ml  Net  -1594 ml    Last BM: unknown   Labs:   Recent Labs Lab 03/26/13 0425 03/27/13 0500 03/28/13 0500 03/29/13 0500  NA 130* 133* 133* 139  K 5.4* 4.6 4.9 3.8  CL 91* 96 96 97  CO2 22 21 18* 23  BUN 76* 37* 59* 32*  CREATININE 6.24* 3.78* 5.23* 3.35*  CALCIUM 8.9 9.0 9.2 9.5  PHOS 6.6* 4.7* 5.6*  --   GLUCOSE 103* 87 88 104*    CBG (last 3)   Recent Labs  03/28/13 2325 03/29/13 0338 03/29/13 0757  GLUCAP 97 98 109*    Scheduled Meds: . antiseptic oral rinse  15 mL Mouth Rinse q12n4p  . calcium acetate  2,668 mg Oral TID WC  . chlorhexidine  15 mL Mouth Rinse BID  . darbepoetin (ARANESP) injection - DIALYSIS  150 mcg Intravenous Q Thu-HD  . heparin subcutaneous  5,000 Units Subcutaneous Q8H  . insulin aspart  0-9 Units Subcutaneous Q4H  . levofloxacin (LEVAQUIN) IV  500 mg Intravenous Q48H  . midodrine  10 mg Oral TID AC  . multivitamin  1 tablet Oral QHS  . pantoprazole (PROTONIX) IV  40 mg Intravenous QHS    Continuous  Infusions: . sodium chloride 20 mL/hr at 03/25/13 1800  . feeding supplement (VITAL AF 1.2 CAL)      Past Medical History  Diagnosis Date  . Hypertension   . GERD (gastroesophageal reflux disease)   . DVT (deep venous thrombosis)   . Complication of anesthesia     " I am hard  to wake up "  . ESRD on hemodialysis     Dialysis at [facility] in [place], [place]. Started dialysis in 2010. ESRD due to DM/HTN.  . Diabetes mellitus   . ESRD (end stage renal disease) on dialysis 08/24/2011    esrd gets hd in [place] with [facility] on tts schedule, started dialysis in 2010     Past Surgical History  Procedure Laterality Date  . Gastric sleeve    . Kidney stones    . Dialysis port    . Compression  hip screw  08/26/2011    Procedure: COMPRESSION HIP;  Surgeon: Velna Ochs, MD;  Location: [facility] OR;  Service: Orthopedics;  Laterality: Left;  DYNAMIC HIP SCREW COMPRESSION  . Ivc filter    . Cholecystostomy tube      Joaquin Courts, RD, LDN, CNSC Pager [phone] After Hours Pager [phone]

## 2013-03-29 NOTE — Progress Notes (Signed)
Subjective: Continues to improve in mental status  Exam: Filed Vitals:   03/29/13 0820  BP:   Pulse: 92  Temp:   Resp: 17   Gen: In bed, NAD MS: Awake, Alert, oriented to person, place, year(initially states 1914, but self corrects to 2014) and gives month as November ZO:[id number] with < 1mm assymetry, EOMI Motor: moves both arms well to command, right leg appears to hav egood strength, left leg with good proximal strength, but left foot drop(old).  Sensory:decreased over left foot   Impression: 47 year old male with multifactorial encephalopathy. Given the profound hypotension associated with his event of unresponsiveness and left-sided weakness, I feel that this was likely the culprit. The jerking movements were captured on EEG and are not epileptic. I suspect myoclonus secondary to metabolic multifactorial encephalopathy   MRI unable to be done due to habitus, but CT was negative.   With continued improvement, I think this also argues for a multifactorial encephalopathy.  Recommendations: 1)No further recommendations at this time, neurology will sign off. Please call if there remain continued questions or concerns.   Ritta Slot, MD [facility] [phone]  If 7pm- 7am, please page neurology on call at [phone].

## 2013-03-29 NOTE — Clinical Social Work Psychosocial (Signed)
Clinical Social Work Department BRIEF PSYCHOSOCIAL ASSESSMENT 03/29/2013  Patient:  [name], [name]     Account Number:  [id number]     Admit date:  03/23/2013  Clinical Social Worker:  Varney Biles  Date/Time:  03/29/2013 02:46 PM  Referred by:  Physician  Date Referred:  03/29/2013 Referred for  Other - See comment   Other Referral:   "General support"   Interview type:  Patient Other interview type:    PSYCHOSOCIAL DATA Living Status:  FAMILY Admitted from facility:   Level of care:   Primary support name:  [name] ([phone]) Primary support relationship to patient:  SPOUSE Degree of support available:   Good--pt was living at home with his wife [name] before hospital admission.    CURRENT CONCERNS Current Concerns  Post-Acute Placement   Other Concerns:    SOCIAL WORK ASSESSMENT / PLAN CSW was consulted to provide "general support." Upon reviewing notes in pt's chart, physician has recommended LTACH but pt refuses. CSW went to assess pt and determine what his plan would be when he discharges. Pt says he spent 3-4 months in an LTACH before, and he does not want to go to one again. Pt wishes to go home with [facility], as they have provided services to him before and he liked this arrangement. CSW has informed RNCM, who will set this up.   Assessment/plan status:  No Further Intervention Required Other assessment/ plan:   Information/referral to community resources:   Pt wishes to go home with home health. RNCM has been informed.    PATIENT'S/FAMILY'S RESPONSE TO PLAN OF CARE: Pt was receptive to CSW and engaged in friendly conversation with CSW. Pt is certain he does not want to go to [facility] and he wants to go home with home health.       Maryclare Labrador, MSW, [facility] Clinical Social Worker [phone]

## 2013-03-29 NOTE — Evaluation (Signed)
Clinical/Bedside Swallow Evaluation Patient Details  Name: [name] MRN: [id number] Date of Birth: [birth year]  Today's Date: 03/29/2013 Time: 1420-1450 SLP Time Calculation (min): 30 min  Past Medical History:  Past Medical History  Diagnosis Date  . Hypertension   . GERD (gastroesophageal reflux disease)   . DVT (deep venous thrombosis)   . Complication of anesthesia     " I am hard  to wake up "  . ESRD on hemodialysis     Dialysis at [facility] in [place], [place]. Started dialysis in 2010. ESRD due to DM/HTN.  . Diabetes mellitus   . ESRD (end stage renal disease) on dialysis 08/24/2011    esrd gets hd in [place] with [facility] on tts schedule, started dialysis in 2010    Past Surgical History:  Past Surgical History  Procedure Laterality Date  . Gastric sleeve    . Kidney stones    . Dialysis port    . Compression hip screw  08/26/2011    Procedure: COMPRESSION HIP;  Surgeon: Velna Ochs, MD;  Location: [facility] OR;  Service: Orthopedics;  Laterality: Left;  DYNAMIC HIP SCREW COMPRESSION  . Ivc filter    . Cholecystostomy tube     HPI:  48 y/o male from dialysis center with dyspnea, hypoxia, and cough with yellow sputum.  Found to have PNA.  Pt describes a h/o acute, reversible dysphagia requiring brief period with thickened liquids s/p intubation during a previous hospitalization, however denies any other swallowing difficulties.   Assessment / Plan / Recommendation Clinical Impression  Pt presents with adequate hyolaryngeal movement and what seems to be a timely swallow initiation, with no overt s/s of aspiration noted. Overall, swallow function appears to be [facility]. Pt does describe a h/o acute, reversible dysphagia s/p intubation in the past, however has not been on thickened liquids since that time. Recommend to initiate a trial of Dys 3 textures for energy conservation due to respiratory status, with thin liquids.     Aspiration Risk  Mild     Diet Recommendation Dysphagia 3 (Mechanical Soft);Thin liquid   Liquid Administration via: Cup;Straw;No straw Medication Administration: Whole meds with liquid Supervision: Patient able to self feed;Full supervision/cueing for compensatory strategies Compensations: Slow rate;Small sips/bites Postural Changes and/or Swallow Maneuvers: Seated upright 90 degrees;Upright 30-60 min after meal    Other  Recommendations Oral Care Recommendations: Oral care BID   Follow Up Recommendations  Other (comment) (TBD;  anticipate that no further f/u will be needed upon d/c)    Frequency and Duration min 2x/week  2 weeks   Pertinent Vitals/Pain N/A    SLP Swallow Goals  Goals set 10/31 - please refer to Care Plan   Swallow Study Prior Functional Status       General Date of Onset: 03/23/13 HPI: 47 y/o male from dialysis center with dyspnea, hypoxia, and cough with yellow sputum.  Found to have PNA.  Pt describes a h/o acute, reversible dysphagia requiring brief period with thickened liquids s/p intubation during a previous hospitalization, however denies any other swallowing difficulties. Type of Study: Bedside swallow evaluation Previous Swallow Assessment: pt reports FEES in the past after being intubated, required a brief period of thickened liquids Diet Prior to this Study: NPO;Panda Temperature Spikes Noted: No Respiratory Status: Nasal cannula History of Recent Intubation: No Behavior/Cognition: Alert;Cooperative;Pleasant mood Oral Cavity - Dentition: Adequate natural dentition Self-Feeding Abilities: Able to feed self Patient Positioning: Upright in bed Baseline Vocal Quality: Clear Volitional Cough: Strong;Congested (  mildly congested) Volitional Swallow: Able to elicit    Oral/Motor/Sensory Function Overall Oral Motor/Sensory Function: Appears within functional limits for tasks assessed   Ice Chips Ice chips: Within functional limits Presentation: Spoon   Thin Liquid Thin  Liquid: Within functional limits Presentation: Cup;Spoon;Straw;Self Fed    Nectar Thick Nectar Thick Liquid: Not tested   Honey Thick Honey Thick Liquid: Not tested   Puree Puree: Within functional limits Presentation: Spoon   Solid   GO    Solid: Within functional limits Presentation: Self [name] 03/29/2013,3:04 PM   Maxcine Ham, M.A. CCC-SLP [phone]

## 2013-03-29 NOTE — Progress Notes (Signed)
PULMONARY  / CRITICAL CARE MEDICINE  Name: [name] MRN: [id number] DOB: [birth year]    ADMISSION DATE:  03/23/2013  REFERRING MD :  Gerhard Munch  CHIEF COMPLAINT:  Cant breath  BRIEF PATIENT DESCRIPTION:  47 y/o male from dialysis center with dyspnea, hypoxia, and cough with yellow sputum.  Found to have PNA.  PCCM asked to admit to ICU. He has ESRD and on daily midodrine  SIGNIFICANT EVENTS/Studies: 10/25 - Admit to ICU, renal consulted 10/26 - levo gtt, bipap 10/27 - levo weaned, intermittent BiPAP + bipap qhs (desat on cpap) 10/28 - episode of dysarthria and decreased alertness after HD that resolved with fluid bolus 10/29 CT head>>>neg acute 10/29 eeg>>> disorganized tricphasic waves   LINES / TUBES: Rt IJ CVL 10/26 >> Aline 10/26 >>out  CULTURES: Blood 10/25 >> not collected Sputum 10/25 >> not collected Influenza 10/25 >> negative  MRSA PCR 10/25>> negative ............ Blood culture 10/29 >  ANTIBIOTICS: Azactam 10/25 >>10/28 Vancomycin 10/25 >>10/28 Levaquin 10/25 >> Tamiflu 10/25 >> 10/26  SUBJECTIVE: No acute events, Taking off BiPAP mask intermittently through the night.   VITAL SIGNS: Temp:  [97.6 F (36.4 C)-98.7 F (37.1 C)] 97.9 F (36.6 C) (10/31 0408) Pulse Rate:  [86-111] 89 (10/31 0500) Resp:  [14-23] 15 (10/31 0500) BP: (55-125)/(23-73) 109/58 mmHg (10/31 0500) SpO2:  [87 %-100 %] 99 % (10/31 0500) FiO2 (%):  [40 %] 40 % (10/30 1015) VM  INTAKE / OUTPUT: Intake/Output     10/30 0701 - 10/31 0700   NG/GT 130   Total Intake(mL/kg) 130 (0.8)   Other 1724   Total Output 1724   Net -1594         PHYSICAL EXAMINATION: General: morbidly obese male lying in bed no distress, lethargic Neuro:  alert, left foot drop, otherwise moves ext  Equally, following commands HEENT:  Pupils reactive, EOMI Cardiovascular:  s1 s2 rrr Lungs: coarse Abdomen:  Obese, mild tenderness throughout to palpation Musculoskeletal:  trace to 1+  edema Skin:  No rashes  LABS: PULMONARY  Recent Labs Lab 03/23/13 1630 03/23/13 2051 03/24/13 0407 03/27/13 0923 03/28/13 1030  PHART 7.398 7.351 7.284* 7.247* 7.283*  PCO2ART 38.4 39.8 48.4* 48.4* 36.6  PO2ART 65.0* 86.0 150.0* 88.0 125.0*  HCO3 23.7 22.0 22.2 21.2 17.4*  TCO2 25 23 23.7 23 19   O2SAT 92.0 96.0 97.6 95.0 98.0    CBC  Recent Labs Lab 03/27/13 0500 03/27/13 2154 03/28/13 0500  HGB 8.1* 7.7* 8.0*  HCT 25.1* 24.3* 25.1*  WBC 9.7 10.7* 12.6*  PLT 151 146* 147*    COAGULATION  Recent Labs Lab 03/27/13 0930  INR 1.12    CARDIAC    Recent Labs Lab 03/27/13 0909 03/27/13 1500 03/27/13 2109  TROPONINI <0.30 <0.30 <0.30    Recent Labs Lab 03/23/13 1353  PROBNP 7725.0*     CHEMISTRY  Recent Labs Lab 03/24/13 0500 03/25/13 0400 03/26/13 0425 03/27/13 0500 03/28/13 0500  NA 133* 132* 130* 133* 133*  K 5.0 4.9 5.4* 4.6 4.9  CL 92* 92* 91* 96 96  CO2 22 22 22 21  18*  GLUCOSE 180* 92 103* 87 88  BUN 41* 61* 76* 37* 59*  CREATININE 4.04* 5.16* 6.24* 3.78* 5.23*  CALCIUM 9.6 9.2 8.9 9.0 9.2  PHOS 5.2*  --  6.6* 4.7* 5.6*   The CrCl is unknown because both a height and weight (above a minimum accepted value) are required for this calculation.   LIVER  Recent Labs Lab 03/23/13  1353 03/24/13 0500 03/26/13 0425 03/27/13 0500 03/27/13 0930 03/28/13 0500  AST 18  --   --   --  10  --   ALT 16  --   --   --  7  --   ALKPHOS 279*  --   --   --  181*  --   BILITOT 0.5  --   --   --  0.4  --   PROT 9.1*  --   --   --  7.4  --   ALBUMIN 3.5 3.0* 2.6* 2.7* 2.8* 2.7*  INR  --   --   --   --  1.12  --      INFECTIOUS  Recent Labs Lab 03/23/13 1406 03/23/13 1659 03/27/13 0909 03/27/13 0930 03/28/13 0500  LATICACIDVEN 2.78* 1.70  --  0.6  --   PROCALCITON  --   --  29.98  --  23.03     ENDOCRINE CBG (last 3)   Recent Labs  03/28/13 2015 03/28/13 2325 03/29/13 0338  GLUCAP 106* 97 98    IMAGING x48h  Ct Head  Wo Contrast  03/27/2013   CLINICAL DATA:  Altered mental status, multiple sclerosis with anterior a shin. Mild chronic twitching.  EXAM: CT HEAD WITHOUT CONTRAST  TECHNIQUE: Contiguous axial images were obtained from the base of the skull through the vertex without intravenous contrast.  COMPARISON:  06/04/2012.  FINDINGS: No evidence of an acute infarct, acute hemorrhage, mass lesion, mass effect or hydrocephalus. Atrophy. Minimal periventricular low attenuation. Remote lacunar infarct or a dilated perivascular space in the left basal ganglia. Retention cysts or polyps in the maxillary sinuses. Scattered mucosal thickening in the paranasal sinuses. Mastoid air cells are clear.  IMPRESSION: 1. No acute intracranial abnormality. 2. Mild atrophy and minimal chronic microvascular white matter ischemic changes.   Electronically Signed   By: Leanna Battles M.D.   On: 03/27/2013 14:33   Dg Chest Port 1 View  03/28/2013   CLINICAL DATA:  assess infiltrates  EXAM: PORTABLE CHEST - 1 VIEW  COMPARISON:  DG CHEST 1V PORT dated 03/26/2013  FINDINGS: Interval placement of NG tube which follows the course of the esophagus with tip below the edge of the film. The side port is just above the GE junction. Stable enlarged cardiac silhouette. There is bibasilar airspace disease is slightly improved. No pneumothorax.  IMPRESSION: 1. NG tube within the stomach. Side port is just above the GE junction. Consider advancement by 6-8 cm such that the side port is below the GE junction. 2. Improvement in pulmonary edema pattern.   Electronically Signed   By: Genevive Bi M.D.   On: 03/28/2013 10:08   Dg Abd Portable 1v  03/27/2013   CLINICAL DATA:  Nasogastric tube placement.  EXAM: PORTABLE ABDOMEN - 1 VIEW  COMPARISON:  None.  FINDINGS: Portable motion degraded examination.  Tip of the nasogastric tube gastric fundus level. The side hole is at the level of the gastroesophageal junction. Recommend advancing the nasogastric  tube 3-5 cm.  Left base consolidation.  Cardiomegaly.  Nonspecific bowel gas pattern.  The possibility of free intraperitoneal air cannot be assessed on a supine view. .  IMPRESSION: Tip of the nasogastric tube gastric fundus level. The side hole is at the level of the gastroesophageal junction. Recommend advancing the nasogastric tube 3-5 cm.  Please see above.  This is a call report.   Electronically Signed   By: Bridgett Larsson M.D.   On:  03/27/2013 11:33   CXR 03/29/2013- Stable L lower lobe infiltrate and R sided patchy infiltrate, improved aeration of L lung, rotated, less edema  ASSESSMENT / PLAN:  PULMONARY A: Acute respiratory failure from volume overload and probable PNA LL PNA Respiratory Acidosis - hypercarbia Prior Chronic critical illness., trch x 2 in 2010 following abdominal sepsis At risk for OSA; not on CPAP Likely OHS  P:   BiPAP qHS, schedule 4 hrs on 2-4 hr off Repeat abg off 1 hr BIPAP oxygen to keep SpO2 > 90%  prn albuterol Seems to have improved with neg balance, cliniclly  CARDIOVASCULAR A:  SIRS/sepsis - lactic acid clearing Hx of hypotension on midodrine as outpt P:  Midodrine for sbp > 90 Tolerated HD   RENAL A:   ESRD- TTS HD Hyponatremia-resolved after neg balanace P:   Chem in am  midodine  GASTROINTESTINAL A:   Morbid obesity >> wife reports he has lost almost 500 lbs since gastric sleeve in 2010. At risk for NASH - Cirrhosis but no clinical stigmata of cirrhosis and not reported in Imaging 2012-2014 P:   Protonix for SUP May start TF today, start 10 cc/hr, strict aspiration precautions slp required  HEMATOLOGIC A:   Leukocytosis- trending down Anemia of chronic and critical illness P:  PRBC for hgb < 7gm% only Hd for volume removal SQ heparin for DVT prevention  INFECTIOUS A:    HCAP >> bacterial vs viral (flu negative). High risk for MDR. Rx handicapped by fact no cutlures sent P:   Continue Levaquin, add stop date total 7  days pcxr now re eval infiltrates, improved aeration left  ENDOCRINE A:   Hyperglycemia.   P:   SSI  NEUROLOGIC A:   Acute encephalopathy - multifactorial with hypotension and possible component of ICU delirium with rapid waxing and waning Myoclonic jerking- possible RLS vs SSRI withdrawal vs medication effect P:   monitor mental status hold outpt lexapro, roxicodone for now until MS clears  Treat resp acidosis  Today's Summary: Continue IV levaquin for HCAP add stop date, ABg off nimv Murtis Sink, MD [facility] Resident, PGY-2 03/29/2013, 7:00 AM  Ccm time 30 min   I have fully examined this patient and agree with above findings.    And edite d infull  Mcarthur Rossetti. Tyson Alias, MD, FACP Pgr: [phone] [facility]

## 2013-03-30 DIAGNOSIS — R0602 Shortness of breath: Secondary | ICD-10-CM

## 2013-03-30 LAB — RENAL FUNCTION PANEL
Albumin: 2.4 g/dL — ABNORMAL LOW (ref 3.5–5.2)
CO2: 26 mEq/L (ref 19–32)
Calcium: 9.2 mg/dL (ref 8.4–10.5)
Creatinine, Ser: 4.55 mg/dL — ABNORMAL HIGH (ref 0.50–1.35)
GFR calc Af Amer: 17 mL/min — ABNORMAL LOW (ref >90)
Glucose, Bld: 121 mg/dL — ABNORMAL HIGH (ref 70–99)
Phosphorus: 4.7 mg/dL — ABNORMAL HIGH (ref 2.3–4.6)
Sodium: 137 mEq/L (ref 135–145)

## 2013-03-30 LAB — CBC
HCT: 24 % — ABNORMAL LOW (ref 39.0–52.0)
Hemoglobin: 7.9 g/dL — ABNORMAL LOW (ref 13.0–17.0)
MCH: 31.6 pg (ref 26.0–34.0)
MCHC: 32.9 g/dL (ref 30.0–36.0)
RDW: 14.6 % (ref 11.5–15.5)
WBC: 8.6 10*3/uL (ref 4.0–10.5)

## 2013-03-30 LAB — GLUCOSE, CAPILLARY
Glucose-Capillary: 104 mg/dL — ABNORMAL HIGH (ref 70–99)
Glucose-Capillary: 104 mg/dL — ABNORMAL HIGH (ref 70–99)
Glucose-Capillary: 115 mg/dL — ABNORMAL HIGH (ref 70–99)
Glucose-Capillary: 126 mg/dL — ABNORMAL HIGH (ref 70–99)

## 2013-03-30 MED ORDER — LORAZEPAM 2 MG/ML IJ SOLN
INTRAMUSCULAR | Status: AC
  Filled 2013-03-30: qty 1

## 2013-03-30 MED ORDER — MIDODRINE HCL 5 MG PO TABS
ORAL_TABLET | ORAL | Status: AC
  Filled 2013-03-30: qty 2

## 2013-03-30 MED ORDER — LORAZEPAM 2 MG/ML IJ SOLN: 0.5000 mg | Freq: Once | INTRAMUSCULAR | Status: AC

## 2013-03-30 NOTE — Progress Notes (Signed)
Patient had 6 beat run nonsustained ventricular tachycardia returning to sinus rhythm.Asymptomatic blood pressure 99/39.Will continue to monitor strip posted on chart.

## 2013-03-30 NOTE — Progress Notes (Signed)
Pt requested to stop tx early stating that everything is moving very fast in his mind. Dr. Casimiro Needle paged and made aware' he ordered Ativan 0.5mg  which was administered to calm the pt and get him to run an additional hour. Pt still refuses to continue to finish tx; MD ordered to stop tx . Pt also refused to sign the Early Termination form despite requesting to end tx early. Pt has been confused to situation eversince he got on the HD unit.

## 2013-03-30 NOTE — Progress Notes (Signed)
Report called to this RN by HD RN saying treatment to the Pt was stopped per Pt's request, that Pt had become anxious and confused; the HD MD was informed. 1.2L of fluid was removed per report and vitals were documented at 1028. Pt arrived to the unit accompanied by 2 HD RN's, he appeared stable and on 6L O2 [place], tolerating well. He also appeared A/O X4 answering all questions appropriately and following commands with some confusion. MD paged and this RN was informed the MD was aware and the PA had conducted an assessment and evaluation of the PT.

## 2013-03-30 NOTE — Progress Notes (Signed)
Speech Language Pathology Treatment: Dysphagia  Patient Details Name: [name] MRN: [id number] DOB: Dec 17, 1965 Today's Date: 03/30/2013 Time: 2725-3664 SLP Time Calculation (min): 16 min  Assessment / Plan / Recommendation Clinical Impression  F/u after yesterday's clinical swallow eval: pt confabulatory, with word-finding deficits.  Consumed POs with intermittent coughing associated with thin liquids; however, pt coughing at baseline due to PNA.  RR compatible with adequate respiratory-swallow sequence.  Will follow briefly to ensure safe PO toleration.   HPI HPI: 47 y/o male from dialysis center with dyspnea, hypoxia, and cough with yellow sputum.  Found to have PNA.  Pt describes a h/o acute, reversible dysphagia requiring brief period with thickened liquids s/p intubation during a previous hospitalization, however denies any other swallowing difficulties.      SLP Plan  Continue with current plan of care    Recommendations Diet recommendations: Dysphagia 3 (mechanical soft);Thin liquid Liquids provided via: Cup Medication Administration: Whole meds with liquid Supervision: Patient able to self feed;Intermittent supervision to cue for compensatory strategies Compensations: Slow rate;Small sips/bites Postural Changes and/or Swallow Maneuvers: Seated upright 90 degrees;Upright 30-60 min after meal              Plan: Continue with current plan of care  Makesha Belitz L. Samson Frederic, [place] CCC/SLP Pager [phone]      Blenda Mounts Laurice 03/30/2013, 12:23 PM

## 2013-03-30 NOTE — Progress Notes (Signed)
Received phone call from patients sister Cammie, requesting consult with Dr Tyson Alias- per pts sister patient called her stating his mental status has changed and would like this episode to be followed by Dr Tyson Alias. Explained to patient sister Condition H. Dr Tyson Alias will be contacted and informed. Rapid response not available to evaluate at this time. Per Dr Tyson Alias, He will contact Katie, PA to go and evaluate.

## 2013-03-30 NOTE — Procedures (Signed)
Tolerating hemodialysis but has confusion and agitation.  He does not want to continue because of stuff "In my head".  Bizarre thoughts. 0.5mg  Ativan given without improvement.  We will terminate treatment 2 hours early per his request.  I think he needs psychiatry to see him. Sonoma Firkus C

## 2013-03-30 NOTE — Progress Notes (Signed)
PULMONARY  / CRITICAL CARE MEDICINE  Name: [name] MRN: [id number] DOB: 1965/11/01    ADMISSION DATE:  03/23/2013  REFERRING MD :  Gerhard Munch  CHIEF COMPLAINT:  Cant breath  BRIEF PATIENT DESCRIPTION:  47 y/o male from dialysis center with dyspnea, hypoxia, and cough with yellow sputum.  Found to have PNA.  PCCM asked to admit to ICU. He has ESRD and on daily midodrine  SIGNIFICANT EVENTS/Studies: 10/25 - Admit to ICU, renal consulted 10/26 - levo gtt, bipap 10/27 - levo weaned, intermittent BiPAP + bipap qhs (desat on cpap) 10/28 - episode of dysarthria and decreased alertness after HD that resolved with fluid bolus 10/29 CT head>>>neg acute 10/29 eeg>>> disorganized tricphasic waves   LINES / TUBES: Rt IJ CVL 10/26 >> Aline 10/26 >>out  CULTURES: Blood 10/25 >> not collected Sputum 10/25 >> not collected Influenza 10/25 >> negative  MRSA PCR 10/25>> negative ............ Blood culture 10/29 >  ANTIBIOTICS: Azactam 10/25 >>10/28 Vancomycin 10/25 >>10/28 Levaquin 10/25 >> Tamiflu 10/25 >> 10/26  SUBJECTIVE: Continues to be confused, oriented to person and place. Disorganized thinking. Hemodynamically stable, off BiPAP saturating fine.   VITAL SIGNS: Temp:  [97.4 F (36.3 C)-98.7 F (37.1 C)] 98 F (36.7 C) (11/01 1949) Pulse Rate:  [76-103] 83 (11/01 1949) Resp:  [10-22] 16 (11/01 1949) BP: (94-182)/(39-117) 134/59 mmHg (11/01 1949) SpO2:  [95 %-100 %] 97 % (11/01 1949) VM  INTAKE / OUTPUT: Intake/Output     11/01 0701 - 11/02 0700   P.O.    Other    NG/GT    Total Intake(mL/kg)    Other 1272   Total Output 1272   Net -1272         PHYSICAL EXAMINATION: General: morbidly obese male lying in bed no distress, confused. Neuro:  alert, left foot drop, otherwise moves ext  Equally, following commands HEENT:  Pupils reactive, EOMI Cardiovascular:  s1 s2 rrr Lungs: coarse breath sounds bilaterally. Abdomen:  Obese, mild tenderness  throughout to palpation Musculoskeletal:  trace to 1+ edema Skin:  No rashes  LABS: PULMONARY  Recent Labs Lab 03/24/13 0407 03/27/13 0923 03/28/13 1030 03/29/13 1129  PHART 7.284* 7.247* 7.283* 7.360  PCO2ART 48.4* 48.4* 36.6 40.5  PO2ART 150.0* 88.0 125.0* 97.0  HCO3 22.2 21.2 17.4* 22.9  TCO2 23.7 23 19 24   O2SAT 97.6 95.0 98.0 97.0    CBC  Recent Labs Lab 03/28/13 0500 03/29/13 0500 03/30/13 0325  HGB 8.0* 8.1* 7.9*  HCT 25.1* 25.6* 24.0*  WBC 12.6* 9.4 8.6  PLT 147* 153 166    COAGULATION  Recent Labs Lab 03/27/13 0930  INR 1.12    CARDIAC    Recent Labs Lab 03/27/13 0909 03/27/13 1500 03/27/13 2109  TROPONINI <0.30 <0.30 <0.30   No results found for this basename: PROBNP,  in the last 168 hours   CHEMISTRY  Recent Labs Lab 03/24/13 0500  03/26/13 0425 03/27/13 0500 03/28/13 0500 03/29/13 0500 03/30/13 0325  NA 133*  < > 130* 133* 133* 139 137  K 5.0  < > 5.4* 4.6 4.9 3.8 3.7  CL 92*  < > 91* 96 96 97 97  CO2 22  < > 22 21 18* 23 26  GLUCOSE 180*  < > 103* 87 88 104* 121*  BUN 41*  < > 76* 37* 59* 32* 47*  CREATININE 4.04*  < > 6.24* 3.78* 5.23* 3.35* 4.55*  CALCIUM 9.6  < > 8.9 9.0 9.2 9.5 9.2  PHOS 5.2*  --  6.6* 4.7* 5.6*  --  4.7*  < > = values in this interval not displayed. The CrCl is unknown because both a height and weight (above a minimum accepted value) are required for this calculation.   LIVER  Recent Labs Lab 03/27/13 0500 03/27/13 0930 03/28/13 0500 03/29/13 0500 03/30/13 0325  AST  --  10  --  9  --   ALT  --  7  --  7  --   ALKPHOS  --  181*  --  163*  --   BILITOT  --  0.4  --  0.4  --   PROT  --  7.4  --  7.4  --   ALBUMIN 2.7* 2.8* 2.7* 2.7* 2.4*  INR  --  1.12  --   --   --      INFECTIOUS  Recent Labs Lab 03/27/13 0909 03/27/13 0930 03/28/13 0500 03/29/13 0500  LATICACIDVEN  --  0.6  --   --   PROCALCITON 29.98  --  23.03 18.01     ENDOCRINE CBG (last 3)   Recent Labs   03/30/13 1212 03/30/13 1700 03/30/13 1947  GLUCAP 126* 104* 104*    IMAGING x48h  Dg Chest Port 1 View  03/29/2013   CLINICAL DATA:  Infiltrates.  EXAM: PORTABLE CHEST - 1 VIEW  COMPARISON:  One-view chest 03/28/2013.  FINDINGS: The NG tube has been advanced. The side port is now beyond the film. The right IJ line is stable. The patient is rotated to the left. The heart is enlarged. Bilateral lower lobe airspace disease is unchanged. Moderate pulmonary vascular congestion is again noted.  IMPRESSION: 1. Persistent bibasilar airspace disease, left greater than right. 2. Moderate pulmonary vascular congestion is unchanged. 3. The NG tube has been advanced. The side port is now below the inferior border of the film.   Electronically Signed   By: Gennette Pac M.D.   On: 03/29/2013 07:22   CXR 03/29/2013- Stable L lower lobe infiltrate and R sided patchy infiltrate, improved aeration of L lung, rotated, less edema  ASSESSMENT / PLAN:  PULMONARY A: Acute respiratory failure from volume overload and probable PNA LL PNA improved. Respiratory Acidosis improved  Prior Chronic critical illness., trch x 2 in 2010 following abdominal sepsis At risk for OSA; not on CPAP Likely OHS  P:   Continue BiPAP qHS, schedule 4 hrs on 2-4 hr off oxygen to keep SpO2 > 90%  prn albuterol   CARDIOVASCULAR A:  SIRS/sepsis - resolved Hx of hypotension on midodrine as outpt P:  Midodrine for sbp > 90 Tolerated HD   RENAL A:   ESRD- TTS HD Hyponatremia-resolved after neg balanace P:   Chem in am  midodine  GASTROINTESTINAL A:   Morbid obesity >> wife reports he has lost almost 500 lbs since gastric sleeve in 2010. At risk for NASH - Cirrhosis but no clinical stigmata of cirrhosis and not reported in Imaging 2012-2014 P:   Protonix for SUP May start TF today, start 10 cc/hr, strict aspiration precautions slp required  HEMATOLOGIC A:   Leukocytosis- resolved Anemia of chronic and critical  illness P:  PRBC for hgb < 7gm% only Hd for volume removal SQ heparin for DVT prevention  INFECTIOUS A:    HCAP >> bacterial vs viral (flu negative). High risk for MDR. Rx handicapped by fact no cutlures sent P:   Continue Levaquin, add stop date total 7 days  ENDOCRINE A:   Hyperglycemia.  P:   SSI  NEUROLOGIC A:   Acute encephalopathy - multifactorial with hypotension and possible component of ICU delirium with rapid waxing and waning Myoclonic jerking- possible RLS vs SSRI withdrawal vs medication effect P:   monitor mental status hold outpt lexapro, roxicodone for now until MS clears    Overton Mam, MD [facility]

## 2013-03-31 LAB — GLUCOSE, CAPILLARY
Glucose-Capillary: 141 mg/dL — ABNORMAL HIGH (ref 70–99)
Glucose-Capillary: 164 mg/dL — ABNORMAL HIGH (ref 70–99)
Glucose-Capillary: 88 mg/dL (ref 70–99)

## 2013-03-31 LAB — RENAL FUNCTION PANEL
CO2: 25 mEq/L (ref 19–32)
Calcium: 9.3 mg/dL (ref 8.4–10.5)
Chloride: 99 mEq/L (ref 96–112)
GFR calc Af Amer: 18 mL/min — ABNORMAL LOW (ref >90)
GFR calc non Af Amer: 16 mL/min — ABNORMAL LOW (ref >90)
Potassium: 4 mEq/L (ref 3.5–5.1)
Sodium: 138 mEq/L (ref 135–145)

## 2013-03-31 MED ORDER — HALOPERIDOL LACTATE 5 MG/ML IJ SOLN
INTRAMUSCULAR | Status: AC
  Filled 2013-03-31: qty 1

## 2013-03-31 MED ORDER — HALOPERIDOL LACTATE 5 MG/ML IJ SOLN
5.0000 mg | Freq: Once | INTRAMUSCULAR | Status: AC
  Administered 2013-03-31: 5 mg via INTRAVENOUS
  Filled 2013-03-31: qty 1

## 2013-03-31 MED ORDER — HALOPERIDOL LACTATE 5 MG/ML IJ SOLN
1.0000 mg | Freq: Four times a day (QID) | INTRAMUSCULAR | Status: DC | PRN
  Administered 2013-03-31: 3 mg via INTRAVENOUS
  Administered 2013-04-01: 4 mg via INTRAVENOUS
  Filled 2013-03-31 (×2): qty 1

## 2013-03-31 NOTE — Progress Notes (Signed)
eLink Physician-Brief Progress Note Patient Name: [name] DOB: 1966/04/29 MRN: [id number]  Date of Service  03/31/2013   HPI/Events of Note   Acute delirium  eICU Interventions   Haldol 5 mg IV x 1   Intervention Category Major Interventions: Delirium, psychosis, severe agitation - evaluation and management  Chaz Ronning 03/31/2013, 3:41 PM

## 2013-03-31 NOTE — Progress Notes (Signed)
Pt. Experiencing increased symptoms of delirium, including delusional thinking, hallucinations, and fragmented ideas.  Appears to escalate with presence of family members.  Discussed psych. Prob. With Dr. Lowell Guitar, see note.

## 2013-03-31 NOTE — Progress Notes (Signed)
Dr. Kendrick Fries in to see patient and family.  Patient much calmer, with noticeable decrease in excitability.  Appears drowsy.  Spoke with family, who seemed content with MD's interaction with them.  Will place patient on BiPap to address symptoms of apparent hypercapnea.  Will forgo ABG to avoid escalating patient.  Patient continues to be closely monitored.

## 2013-03-31 NOTE — Progress Notes (Signed)
Patient refusing BiPap at this time 

## 2013-03-31 NOTE — Progress Notes (Signed)
Patient having fragmented thoughts.  Hallucinating and is nonsensical.  (i.e. "Do you see that television?  What color is it?  It's black.  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  The TV is black and I was born white.")  Unable to re-orient or to calm patient, who feels panicky at this time.  States he knows he is dying and states his driver's license tells him he is white.  No connection between his flight of ideas exists.  Becomes increasingly upset when further engaged in conversation.  Verbal interactions need to be brief at this time as longer interactions appear to escalate his anxiety.

## 2013-03-31 NOTE — Progress Notes (Signed)
eLink Physician-Brief Progress Note Patient Name: [name] DOB: 1966-02-12 MRN: [id number]  Date of Service  03/31/2013   HPI/Events of Note   Family requesting conference to discuss neurological condition Camera is malfunctioning    eICU Interventions   Asked bedside MD to meet with family    Intervention Category Intermediate Interventions: Communication with other healthcare providers and/or family  Lonia Farber 03/31/2013, 4:08 PM

## 2013-03-31 NOTE — Progress Notes (Signed)
Nurse discussed plan of care for patient with delirium, including decreased stimulation, limiting number of visitors, and not attempting to re-orient aggressively, which only escalates the patient.  Family appear to be in agreement, but are demanding a conference via the cameral in the room with Dr. Marin Shutter, re: the patient's confusion.  They state to this nurse that "he is threatening to bite himself and kill himself".  It was observed that the noise level in the room was excessive and it was necessary to again discuss the importance of a calm environment to de-escalate the patient.  Dr. Marin Shutter notified.

## 2013-03-31 NOTE — Progress Notes (Signed)
PT Cancellation Note  Patient Details Name: [name] MRN: [id number] DOB: 1966/01/31   Cancelled Treatment:    Reason Eval/Treat Not Completed: Patient not medically ready.  Pt agitated and hallucinating therefore RN recommended to hold PT today.    Marye Eagen 03/31/2013, 1:26 PM Jake Shark, PT DPT [phone]

## 2013-03-31 NOTE — Progress Notes (Signed)
assessment:  1 PNA right lower lobe  2 ESRD cont hd tts ([facility]) 3 anemia of esrd 4 delirium and psychosis 5 chronic hypotension/volume-ok  6 morbid obesity, s/p gastric sleeve  plan- HD TTS, rec psychiatry eval  Subjective: Interval History: He signed off HD treatment early yesterday, confused and paranoid  Objective: Vital signs in last 24 hours: Temp:  [97.9 F (36.6 C)-98.7 F (37.1 C)] 97.9 F (36.6 C) (11/02 0743) Pulse Rate:  [78-97] 88 (11/02 0743) Resp:  [13-22] 14 (11/02 0445) BP: (97-182)/(48-88) 110/63 mmHg (11/02 0753) SpO2:  [96 %-100 %] 100 % (11/02 0743) Weight:  [159.9 kg (352 lb 8.3 oz)] 159.9 kg (352 lb 8.3 oz) (11/02 0445) Weight change:   Intake/Output from previous day: 11/01 0701 - 11/02 0700 In: -  Out: 1272  Intake/Output this shift:    General appearance: alert and talkative Extremities: extremities normal, atraumatic, no cyanosis or edema LUE AV acces Delusional and paranoid, flight of ideas  Lab Results:  Recent Labs  03/29/13 0500 03/30/13 0325  WBC 9.4 8.6  HGB 8.1* 7.9*  HCT 25.6* 24.0*  PLT 153 166   BMET:  Recent Labs  03/30/13 0325 03/31/13 0526  NA 137 138  K 3.7 4.0  CL 97 99  CO2 26 25  GLUCOSE 121* 193*  BUN 47* 36*  CREATININE 4.55* 4.27*  CALCIUM 9.2 9.3   No results found for this basename: PTH,  in the last 72 hours Iron Studies: No results found for this basename: IRON, TIBC, TRANSFERRIN, FERRITIN,  in the last 72 hours Studies/Results: No results found.  Scheduled: . antiseptic oral rinse  15 mL Mouth Rinse q12n4p  . calcium acetate  2,668 mg Oral TID WC  . chlorhexidine  15 mL Mouth Rinse BID  . darbepoetin (ARANESP) injection - DIALYSIS  150 mcg Intravenous Q Thu-HD  . escitalopram  20 mg Oral Daily  . heparin subcutaneous  5,000 Units Subcutaneous Q8H  . insulin aspart  0-9 Units Subcutaneous Q4H  . midodrine  10 mg Oral TID AC  . multivitamin  1 tablet Oral QHS  . pantoprazole  (PROTONIX) IV  40 mg Intravenous QHS    LOS: 8 days   Merin Borjon C 03/31/2013,11:00 AM

## 2013-03-31 NOTE — Progress Notes (Signed)
I received a phone call from the patient's HCPOA, his sister, who stated she needed a "note from the doctor who saw [name] today so I can access his bank accounts.  The note needs to say that he is mentally not able to make decisions for himself so I can inact my POA status."  I stated to her that I would leave the MD a note, but that such information could not be given to her in a note to be faxed to his bank, as it was an infringement on the Privacy Act to do so.  She seemed to be satisfied with this, but wants to pick up a note to do so.  As far as is known, she is health care POA only and financial POA has not been established.

## 2013-03-31 NOTE — Progress Notes (Signed)
[facility] PCCM  Intermittent delirium, now better with bipap Discussed with family at length, they feel he gets this way when he is hypercarbic and he has not slept or used bipap consistently in a few days  Need to re-establish sleep-wake cycle Now napping post haldol, will request that RT place on BIPAP now and continue to use QHS  Yolonda Kida PCCM Pager: [phone] Cell: [phone] If no response, call [phone]

## 2013-03-31 NOTE — Progress Notes (Signed)
Family demanding to see physician "now", or "we will call the owner of the hospital" regarding ongoing delirium.  Spoke with Dr. Marin Shutter, who ordered 5 mg. Of Haldol, IV.  QTc = 47 at this time.  Will administer to pt. And monitor closely.

## 2013-03-31 NOTE — Progress Notes (Signed)
PULMONARY  / CRITICAL CARE MEDICINE  Name: Gevork Ayyad MRN: [id number] DOB: [birth year]    ADMISSION DATE:  03/23/2013  REFERRING MD :  Gerhard Munch  CHIEF COMPLAINT:  Cant breath  BRIEF PATIENT DESCRIPTION:  47 y/o male from dialysis center with dyspnea, hypoxia, and cough with yellow sputum.  Found to have PNA.  PCCM asked to admit to ICU. He has ESRD and on daily midodrine  SIGNIFICANT EVENTS/Studies: 10/25 - Admit to ICU, renal consulted 10/26 - levo gtt, bipap 10/27 - levo weaned, intermittent BiPAP + bipap qhs (desat on cpap) 10/28 - episode of dysarthria and decreased alertness after HD that resolved with fluid bolus 10/29 CT head>>>neg acute 10/29 eeg>>> disorganized tricphasic waves   LINES / TUBES: Rt IJ CVL 10/26 >> Aline 10/26 >>out  CULTURES: Blood 10/25 >> not collected Sputum 10/25 >> not collected Influenza 10/25 >> negative  MRSA PCR 10/25>> negative ............ Blood culture 10/29 >  ANTIBIOTICS: Azactam 10/25 >>10/28 Vancomycin 10/25 >>10/28 Levaquin 10/25 >> Tamiflu 10/25 >> 10/26  SUBJECTIVE: Continues to be confused, oriented to person and place. Disorganized thinking. Hemodynamically stable, off BiPAP saturating fine.   VITAL SIGNS: Temp:  [97.9 F (36.6 C)-98.7 F (37.1 C)] 97.9 F (36.6 C) (11/02 0743) Pulse Rate:  [78-103] 88 (11/02 0743) Resp:  [13-22] 14 (11/02 0445) BP: (97-182)/(48-117) 110/63 mmHg (11/02 0753) SpO2:  [95 %-100 %] 100 % (11/02 0743) Weight:  [159.9 kg (352 lb 8.3 oz)] 159.9 kg (352 lb 8.3 oz) (11/02 0445) VM  INTAKE / OUTPUT: Intake/Output     11/01 0701 - 11/02 0700 11/02 0701 - 11/03 0700   P.O.     Other     NG/GT     Total Intake(mL/kg)     Other 1272    Total Output 1272     Net -1272          Stool Occurrence 1 x      PHYSICAL EXAMINATION: General: morbidly obese male lying in bed no distress, confused. Neuro:  alert, left foot drop, otherwise moves ext  Equally, following commands HEENT:   Pupils reactive, EOMI Cardiovascular:  s1 s2 rrr Lungs: coarse breath sounds bilaterally. Abdomen:  Obese, mild tenderness throughout to palpation Musculoskeletal:  trace to 1+ edema Skin:  No rashes  LABS: PULMONARY  Recent Labs Lab 03/27/13 0923 03/28/13 1030 03/29/13 1129  PHART 7.247* 7.283* 7.360  PCO2ART 48.4* 36.6 40.5  PO2ART 88.0 125.0* 97.0  HCO3 21.2 17.4* 22.9  TCO2 23 19 24   O2SAT 95.0 98.0 97.0    CBC  Recent Labs Lab 03/28/13 0500 03/29/13 0500 03/30/13 0325  HGB 8.0* 8.1* 7.9*  HCT 25.1* 25.6* 24.0*  WBC 12.6* 9.4 8.6  PLT 147* 153 166    COAGULATION  Recent Labs Lab 03/27/13 0930  INR 1.12    CARDIAC    Recent Labs Lab 03/27/13 0909 03/27/13 1500 03/27/13 2109  TROPONINI <0.30 <0.30 <0.30   No results found for this basename: PROBNP,  in the last 168 hours   CHEMISTRY  Recent Labs Lab 03/26/13 0425 03/27/13 0500 03/28/13 0500 03/29/13 0500 03/30/13 0325 03/31/13 0526  NA 130* 133* 133* 139 137 138  K 5.4* 4.6 4.9 3.8 3.7 4.0  CL 91* 96 96 97 97 99  CO2 22 21 18* 23 26 25   GLUCOSE 103* 87 88 104* 121* 193*  BUN 76* 37* 59* 32* 47* 36*  CREATININE 6.24* 3.78* 5.23* 3.35* 4.55* 4.27*  CALCIUM 8.9 9.0 9.2 9.5  9.2 9.3  PHOS 6.6* 4.7* 5.6*  --  4.7* 4.4   The CrCl is unknown because both a height and weight (above a minimum accepted value) are required for this calculation.   LIVER  Recent Labs Lab 03/27/13 0930 03/28/13 0500 03/29/13 0500 03/30/13 0325 03/31/13 0526  AST 10  --  9  --   --   ALT 7  --  7  --   --   ALKPHOS 181*  --  163*  --   --   BILITOT 0.4  --  0.4  --   --   PROT 7.4  --  7.4  --   --   ALBUMIN 2.8* 2.7* 2.7* 2.4* 2.8*  INR 1.12  --   --   --   --      INFECTIOUS  Recent Labs Lab 03/27/13 0909 03/27/13 0930 03/28/13 0500 03/29/13 0500  LATICACIDVEN  --  0.6  --   --   PROCALCITON 29.98  --  23.03 18.01     ENDOCRINE CBG (last 3)   Recent Labs  03/30/13 1947  03/31/13 0001 03/31/13 0443  GLUCAP 104* 88 183*    IMAGING x48h  No results found. CXR 03/29/2013- Stable L lower lobe infiltrate and R sided patchy infiltrate, improved aeration of L lung, rotated, less edema    ASSESSMENT / PLAN:  PULMONARY A: Acute respiratory failure from volume overload and probable PNA LL PNA improved. Respiratory Acidosis improved  Prior Chronic critical illness., trch x 2 in 2010 following abdominal sepsis At risk for OSA; not on CPAP Likely OHS P:   Continue BiPAP qHS, schedule 4 hrs on 2-4 hr off oxygen to keep SpO2 > 90%  prn albuterol   CARDIOVASCULAR A:  SIRS/sepsis - resolved Hx of hypotension on midodrine as outpt P:  Midodrine for sbp > 90 Tolerated HD    RENAL A:   ESRD- TTS HD Hyponatremia-resolved after neg balanace P:   Follow [place] midodine   GASTROINTESTINAL A:   Morbid obesity >> wife reports he has lost almost 500 lbs since gastric sleeve in 2010. At risk for NASH - Cirrhosis but no clinical stigmata of cirrhosis and not reported in Imaging 2012-2014 P:   Protonix for SUP May start TF today, start 10 cc/hr, strict aspiration precautions slp required   HEMATOLOGIC A:   Leukocytosis- resolved Anemia of chronic and critical illness P:  PRBC for hgb < 7gm% only Hd for volume removal SQ heparin for DVT prevention   INFECTIOUS A:    HCAP >> bacterial vs viral (flu negative). High risk for MDR. Rx handicapped by fact no cutlures sent P:   Continue Levaquin, add stop date total 7 days   ENDOCRINE A:   Hyperglycemia.   P:   SSI   NEUROLOGIC A:   Acute encephalopathy - multifactorial with hypotension and possible component of ICU delirium with rapid waxing and waning Myoclonic jerking- possible RLS vs SSRI withdrawal vs medication effect P:   monitor mental status hold outpt lexapro, roxicodone for now until MS clears    Kaleigha Chamberlin M. Kriste Basque, MD [facility]  03/31/13 @  9:01AM

## 2013-04-01 DIAGNOSIS — G4733 Obstructive sleep apnea (adult) (pediatric): Secondary | ICD-10-CM

## 2013-04-01 LAB — RENAL FUNCTION PANEL
Albumin: 2.9 g/dL — ABNORMAL LOW (ref 3.5–5.2)
BUN: 44 mg/dL — ABNORMAL HIGH (ref 6–23)
Calcium: 9.3 mg/dL (ref 8.4–10.5)
Creatinine, Ser: 5.65 mg/dL — ABNORMAL HIGH (ref 0.50–1.35)
GFR calc non Af Amer: 11 mL/min — ABNORMAL LOW (ref >90)
Glucose, Bld: 129 mg/dL — ABNORMAL HIGH (ref 70–99)
Phosphorus: 4.2 mg/dL (ref 2.3–4.6)
Potassium: 4.3 mEq/L (ref 3.5–5.1)

## 2013-04-01 LAB — GLUCOSE, CAPILLARY
Glucose-Capillary: 107 mg/dL — ABNORMAL HIGH (ref 70–99)
Glucose-Capillary: 129 mg/dL — ABNORMAL HIGH (ref 70–99)
Glucose-Capillary: 121 mg/dL — ABNORMAL HIGH (ref 70–99)
Glucose-Capillary: 112 mg/dL — ABNORMAL HIGH (ref 70–99)

## 2013-04-01 NOTE — Progress Notes (Signed)
Speech Language Pathology Treatment: Dysphagia  Patient Details Name: [name] MRN: [id number] DOB: [birth year] Today's Date: 04/01/2013 Time: 0347-4259 SLP Time Calculation (min): 27 min  Assessment / Plan / Recommendation Clinical Impression  Pt. Appears to be tolerating current diet well, and took sips of water via straw without overt s/s of aspiration.  Pt. And wife deny dysphagia.  Pt. Appears to have a timely swallow initiation, adequate laryngeal elevation upon palpatation, no cough or throat clearing, and voice is clear after swallows.   HPI HPI: 47 y/o male from dialysis center with dyspnea, hypoxia, and cough with yellow sputum.  Found to have PNA.  Pt describes a h/o acute, reversible dysphagia requiring brief period with thickened liquids s/p intubation during a previous hospitalization, however denies any other swallowing difficulties.   Pertinent Vitals Afebrile; BS diminished but clear.  SLP Plan  Discharge SLP treatment due to (comment);All goals met    Recommendations Diet recommendations: Regular;Thin liquid Liquids provided via: Straw;Cup Medication Administration: Whole meds with liquid Supervision: Patient able to self feed;Intermittent supervision to cue for compensatory strategies Compensations: Slow rate;Small sips/bites Postural Changes and/or Swallow Maneuvers: Seated upright 90 degrees;Upright 30-60 min after meal              Oral Care Recommendations: Oral care BID Follow up Recommendations: 24 hour supervision/assistance Plan: Discharge SLP treatment due to (comment);All goals met    GO     Maryjo Rochester T 04/01/2013, 2:57 PM

## 2013-04-01 NOTE — Progress Notes (Signed)
PULMONARY  / CRITICAL CARE MEDICINE  Name: [name] MRN: [id number] DOB: Mar 03, 1966    ADMISSION DATE:  03/23/2013  REFERRING MD :  Gerhard Munch  CHIEF COMPLAINT:  Cant breath  BRIEF PATIENT DESCRIPTION:  47 y/o male from dialysis center with dyspnea, hypoxia, and cough with yellow sputum.  Found to have PNA.  PCCM asked to admit to ICU. He has ESRD and on daily midodrine  SIGNIFICANT EVENTS/Studies: 10/25 - Admit to ICU, renal consulted 10/26 - levo gtt, bipap 10/27 - levo weaned, intermittent BiPAP + bipap qhs (desat on cpap) 10/28 - episode of dysarthria and decreased alertness after HD that resolved with fluid bolus 10/29 CT head>>>neg acute 10/29 eeg>>> disorganized tricphasic waves   LINES / TUBES: Rt IJ CVL 10/26 >> Aline 10/26 >>out  CULTURES: Influenza 10/25 >> negative  MRSA PCR 10/25>> negative ............ Blood culture 10/29 >ng  ANTIBIOTICS: Azactam 10/25 >>10/28 Vancomycin 10/25 >>10/28 Levaquin 10/25 >> Tamiflu 10/25 >> 10/26  SUBJECTIVE: Less confused, oriented to person and place. Disorganized thinking. Did nto use bipap overnight  VITAL SIGNS: Temp:  [97.1 F (36.2 C)-98.1 F (36.7 C)] 98.1 F (36.7 C) (11/03 1239) Pulse Rate:  [75-92] 91 (11/03 1239) Resp:  [17-23] 19 (11/03 1239) BP: (113-139)/(52-69) 129/52 mmHg (11/03 1239) SpO2:  [97 %-100 %] 100 % (11/03 1239) Weight:  [146.6 kg (323 lb 3.1 oz)] 146.6 kg (323 lb 3.1 oz) (11/03 0500) VM  INTAKE / OUTPUT: Intake/Output     11/02 0701 - 11/03 0700 11/03 0701 - 11/04 0700   P.O. 600    Total Intake(mL/kg) 600 (4.1)    Other     Total Output 0     Net +600            PHYSICAL EXAMINATION: General: morbidly obese male lying in bed no distress Neuro:  alert, left foot drop, otherwise moves ext  Equally, following commands HEENT:  Pupils reactive, EOMI Cardiovascular:  s1 s2 rrr Lungs: coarse breath sounds bilaterally. Abdomen:  Obese, mild tenderness throughout to  palpation Musculoskeletal:  trace to 1+ edema Skin:  No rashes  LABS: PULMONARY  Recent Labs Lab 03/27/13 0923 03/28/13 1030 03/29/13 1129  PHART 7.247* 7.283* 7.360  PCO2ART 48.4* 36.6 40.5  PO2ART 88.0 125.0* 97.0  HCO3 21.2 17.4* 22.9  TCO2 23 19 24   O2SAT 95.0 98.0 97.0    CBC  Recent Labs Lab 03/28/13 0500 03/29/13 0500 03/30/13 0325  HGB 8.0* 8.1* 7.9*  HCT 25.1* 25.6* 24.0*  WBC 12.6* 9.4 8.6  PLT 147* 153 166    COAGULATION  Recent Labs Lab 03/27/13 0930  INR 1.12    CARDIAC    Recent Labs Lab 03/27/13 0909 03/27/13 1500 03/27/13 2109  TROPONINI <0.30 <0.30 <0.30   No results found for this basename: PROBNP,  in the last 168 hours   CHEMISTRY  Recent Labs Lab 03/27/13 0500 03/28/13 0500 03/29/13 0500 03/30/13 0325 03/31/13 0526 04/01/13 0600  NA 133* 133* 139 137 138 139  K 4.6 4.9 3.8 3.7 4.0 4.3  CL 96 96 97 97 99 99  CO2 21 18* 23 26 25 25   GLUCOSE 87 88 104* 121* 193* 129*  BUN 37* 59* 32* 47* 36* 44*  CREATININE 3.78* 5.23* 3.35* 4.55* 4.27* 5.65*  CALCIUM 9.0 9.2 9.5 9.2 9.3 9.3  PHOS 4.7* 5.6*  --  4.7* 4.4 4.2   The CrCl is unknown because both a height and weight (above a minimum accepted value) are required  for this calculation.   LIVER  Recent Labs Lab 03/27/13 0930 03/28/13 0500 03/29/13 0500 03/30/13 0325 03/31/13 0526 04/01/13 0600  AST 10  --  9  --   --   --   ALT 7  --  7  --   --   --   ALKPHOS 181*  --  163*  --   --   --   BILITOT 0.4  --  0.4  --   --   --   PROT 7.4  --  7.4  --   --   --   ALBUMIN 2.8* 2.7* 2.7* 2.4* 2.8* 2.9*  INR 1.12  --   --   --   --   --      INFECTIOUS  Recent Labs Lab 03/27/13 0909 03/27/13 0930 03/28/13 0500 03/29/13 0500  LATICACIDVEN  --  0.6  --   --   PROCALCITON 29.98  --  23.03 18.01     ENDOCRINE CBG (last 3)   Recent Labs  04/01/13 0525 04/01/13 0738 04/01/13 1227  GLUCAP 129* 121* 120*    IMAGING x48h  No results found. CXR  03/29/2013- Stable L lower lobe infiltrate and R sided patchy infiltrate, improved aeration of L lung, rotated, less edema  ASSESSMENT / PLAN:  PULMONARY A: Acute respiratory failure from volume overload and probable PNA LL PNA improved. Respiratory Acidosis improved  Prior Chronic critical illness., trch x 2 in 2010 following abdominal sepsis At risk for OSA; not on CPAP Likely OHS  P:   Continue BiPAP qHS, he agrees to use -has CPAP at home oxygen to keep SpO2 > 90%  prn albuterol   CARDIOVASCULAR A:  SIRS/sepsis - resolved Hx of hypotension on midodrine as outpt P:  Midodrine for sbp > 90 Tolerated HD   RENAL A:   ESRD- TTS HD Hyponatremia-resolved after neg balanace P:   Chem in am  midodine  GASTROINTESTINAL A:   Morbid obesity >> wife reports he has lost almost 500 lbs since gastric sleeve in 2010. At risk for NASH - Cirrhosis but no clinical stigmata of cirrhosis and not reported in Imaging 2012-2014 P:   Protonix for SUP Advance PO  HEMATOLOGIC A:   Leukocytosis- resolved Anemia of chronic and critical illness P:  PRBC for hgb < 7gm% only SQ heparin for DVT prevention  INFECTIOUS A:    HCAP >> bacterial vs viral (flu negative). High risk for MDR. Rx handicapped by fact no cutlures sent P:   Continue Levaquin, add stop date total 7 days  ENDOCRINE A:   Hyperglycemia.   P:   SSI  NEUROLOGIC A:   Acute encephalopathy - multifactorial with hypotension and possible component of ICU delirium with rapid waxing and waning Myoclonic jerking- possible RLS vs SSRI withdrawal vs medication effect P:   monitor mental status hold outpt lexapro, roxicodone for now until MS clears    Hope for dc after HD tomorrow  Cyril Mourning MD. FCCP. [facility] Pager [phone] If no response call [phone]

## 2013-04-01 NOTE — Evaluation (Signed)
Physical Therapy Evaluation Patient Details Name: [name] MRN: [id number] DOB: 1966/05/25 Today's Date: 04/01/2013 Time: 4098-1191 PT Time Calculation (min): 32 min  PT Assessment / Plan / Recommendation History of Present Illness  47 y/o male from dialysis center with dyspnea, hypoxia, and cough with yellow sputum.  Found to have PNA.  PCCM asked to admit to ICU. He has ESRD and on daily midodrine  Clinical Impression  Pt admitted with pneumonia. Pt is able to return home with family's assist, however he is not fully back to baseline and would benefit from HHPT. (He has been unable to stand x 3 weeks and wants to resume standing and walking). Pt currently with functional limitations due to the deficits listed below (see PT Problem List).      PT Assessment  All further PT needs can be met in the next venue of care    Follow Up Recommendations  Home health PT;Supervision for mobility/OOB    Does the patient have the potential to tolerate intense rehabilitation      Barriers to Discharge        Equipment Recommendations  None recommended by PT    Recommendations for Other Services     Frequency      Precautions / Restrictions Precautions Precautions: Fall   Pertinent Vitals/Pain SaO2 97-98% on Kermit O2      Mobility  Bed Mobility Bed Mobility: Rolling Left;Left Sidelying to Sit;Sitting - Scoot to [place] of Bed;Sit to Sidelying Left Rolling Left: 5: Supervision Left Sidelying to Sit: 4: Min assist;With rails Sitting - Scoot to Edge of Bed: 5: Supervision Sit to Sidelying Left: 4: Min assist;With rail Details for Bed Mobility Assistance: assist to raise trunk to sit and assist to raise legs to supine Transfers Transfers: Sit to Stand Sit to Stand: Other (comment) (unable to clear hips off elevated bed with +1 x 3 attempts) Details for Transfer Assistance: pt had just transferred back to bed from scooter with wife's assist. Wife reported he slides in/out of scooter with  her assist and is at baseline today Wheelchair Mobility Wheelchair Mobility: No    Exercises General Exercises - Lower Extremity Long Arc Quad: AROM;Both;10 reps;Seated Straight Leg Raises: AROM;Both;10 reps;Supine Low Level/ICU Exercises Stabilized Bridging: AAROM;Both;10 reps;Supine (stabilized Lt foot to prevent sliding)   PT Diagnosis: Generalized weakness  PT Problem List: Decreased strength;Decreased mobility;Decreased knowledge of use of DME PT Treatment Interventions:       PT Goals(Current goals can be found in the care plan section) Acute Rehab PT Goals Patient Stated Goal: I want to walk again PT Goal Formulation: No goals set, d/c therapy  Visit Information  Last PT Received On: 04/01/13 Assistance Needed: +2 (to try to stand) History of Present Illness: 47 y/o male from dialysis center with dyspnea, hypoxia, and cough with yellow sputum.  Found to have PNA.  PCCM asked to admit to ICU. He has ESRD and on daily midodrine       Prior Functioning  Home Living Family/patient expects to be discharged to:: Private residence Living Arrangements: Spouse/significant other Available Help at Discharge: Family Type of Home: House Home Access: Ramped entrance Home Layout: One level Home Equipment: Environmental consultant - 2 wheels;Wheelchair - power;Adaptive equipment;Bedside commode;Other (comment) (drop arm BSC, craftmatic bed, van with lift, sliding board) Adaptive Equipment: Reacher Prior Function Level of Independence: Needs assistance Gait / Transfers Assistance Needed: does lateral scoot or sliding board transfer with wife's assist onto/out of electric scooter; pt reports it's been three weeks since  he stood with RW (states he has not stood b/c "I'm lazy"); has not walked "in months" ADL's / Homemaking Assistance Needed: wife assists with ADLs Communication Communication: No difficulties Dominant Hand: Right    Cognition  Cognition Arousal/Alertness: Awake/alert Behavior During  Therapy: WFL for tasks assessed/performed Overall Cognitive Status: Within Functional Limits for tasks assessed    Extremity/Trunk Assessment Upper Extremity Assessment Upper Extremity Assessment: Generalized weakness Lower Extremity Assessment Lower Extremity Assessment: RLE deficits/detail;LLE deficits/detail RLE Deficits / Details: AROM WFL; knee extension 3+ (sitting); hip extension 2- (supine) LLE Deficits / Details: AROM WFL except ankle no AROM (reports foot drop after he fell and broke his femur last year); knee extension 3- (sitting); hip extension 2- (supine) Cervical / Trunk Assessment Cervical / Trunk Assessment: Other exceptions Cervical / Trunk Exceptions: trunk weakness evident as needs assist with supine to sit   Balance Balance Balance Assessed: Yes Static Sitting Balance Static Sitting - Balance Support: No upper extremity supported;Feet supported Static Sitting - Level of Assistance: 7: Independent Dynamic Sitting Balance Dynamic Sitting - Balance Support: Bilateral upper extremity supported;Feet supported Dynamic Sitting - Level of Assistance: 6: Modified independent (Device/Increase time) Dynamic Sitting - Comments: while doing LE exercises in sitting EOB  End of Session PT - End of Session Equipment Utilized During Treatment: Oxygen Activity Tolerance: Patient tolerated treatment well Patient left: in bed;with call bell/phone within reach;with nursing/sitter in room  GP     Estephany Perot 04/01/2013, 4:16 PM Pager [phone]

## 2013-04-01 NOTE — Progress Notes (Signed)
S: Says he is eating. Has a banana at bedside. O:BP 137/69  Pulse 91  Temp(Src) 98 F (36.7 C) (Oral)  Resp 18  Wt 146.6 kg (323 lb 3.1 oz)  SpO2 100%  Intake/Output Summary (Last 24 hours) at 04/01/13 0850 Last data filed at 04/01/13 0600  Gross per 24 hour  Intake    270 ml  Output      0 ml  Net    270 ml   Weight change: -13.3 kg (-29 lb 5.1 oz) WUJ:WJXBJ and alert CVS:RRR Resp: decreased BS bases YNW:GNFAO, + BS NTND Ext: + edema  LLA AVF + bruit NEURO:Ox3 no asterixis   . antiseptic oral rinse  15 mL Mouth Rinse q12n4p  . calcium acetate  2,668 mg Oral TID WC  . chlorhexidine  15 mL Mouth Rinse BID  . darbepoetin (ARANESP) injection - DIALYSIS  150 mcg Intravenous Q Thu-HD  . escitalopram  20 mg Oral Daily  . heparin subcutaneous  5,000 Units Subcutaneous Q8H  . insulin aspart  0-9 Units Subcutaneous Q4H  . midodrine  10 mg Oral TID AC  . multivitamin  1 tablet Oral QHS  . pantoprazole (PROTONIX) IV  40 mg Intravenous QHS   No results found. BMET    Component Value Date/Time   NA 139 04/01/2013 0600   K 4.3 04/01/2013 0600   CL 99 04/01/2013 0600   CO2 25 04/01/2013 0600   GLUCOSE 129* 04/01/2013 0600   BUN 44* 04/01/2013 0600   CREATININE 5.65* 04/01/2013 0600   CALCIUM 9.3 04/01/2013 0600   GFRNONAA 11* 04/01/2013 0600   GFRAA 13* 04/01/2013 0600   CBC    Component Value Date/Time   WBC 8.6 03/30/2013 0325   RBC 2.50* 03/30/2013 0325   HGB 7.9* 03/30/2013 0325   HCT 24.0* 03/30/2013 0325   PLT 166 03/30/2013 0325   MCV 96.0 03/30/2013 0325   MCH 31.6 03/30/2013 0325   MCHC 32.9 03/30/2013 0325   RDW 14.6 03/30/2013 0325   LYMPHSABS 0.9 03/29/2013 0500   MONOABS 0.8 03/29/2013 0500   EOSABS 0.1 03/29/2013 0500   BASOSABS 0.0 03/29/2013 0500     Assessment:  1. RLL PNA 2. ESRD 3. Anemia on aranesp 4.encephalopathy, improved   Plan: 1. HD in AM 2. Renal diet 3. Dose not need daily renal profile 4  Check CBC at HD in AM   [name] T

## 2013-04-01 NOTE — Progress Notes (Signed)
Patient requesting glasses. Glasses at bedside broken and patient states that he broke them

## 2013-04-01 NOTE — Progress Notes (Signed)
PT Cancellation Note  Patient Details Name: [name] MRN: [id number] DOB: 18-Nov-1965   Cancelled Treatment:    Reason Eval/Treat Not Completed: Pt on bedrest. Await MD clarification if OK to proceed with activity as tolerated.   Marvena Tally 04/01/2013, 8:32 AM Pager [phone]

## 2013-04-02 ENCOUNTER — Inpatient Hospital Stay (HOSPITAL_COMMUNITY): Payer: Medicare Other

## 2013-04-02 ENCOUNTER — Other Ambulatory Visit: Payer: Self-pay | Admitting: Pulmonary Disease

## 2013-04-02 DIAGNOSIS — E662 Morbid (severe) obesity with alveolar hypoventilation: Secondary | ICD-10-CM

## 2013-04-02 LAB — CBC
Hemoglobin: 9.1 g/dL — ABNORMAL LOW (ref 13.0–17.0)
MCH: 31 pg (ref 26.0–34.0)
Platelets: 240 10*3/uL (ref 150–400)
RBC: 2.94 MIL/uL — ABNORMAL LOW (ref 4.22–5.81)
RDW: 15.2 % (ref 11.5–15.5)
WBC: 9.7 10*3/uL (ref 4.0–10.5)

## 2013-04-02 LAB — GLUCOSE, CAPILLARY
Glucose-Capillary: 124 mg/dL — ABNORMAL HIGH (ref 70–99)
Glucose-Capillary: 90 mg/dL (ref 70–99)

## 2013-04-02 LAB — CULTURE, BLOOD (ROUTINE X 2): Culture: NO GROWTH

## 2013-04-02 LAB — IRON AND TIBC
Saturation Ratios: 21 % (ref 20–55)
TIBC: 237 ug/dL (ref 215–435)
UIBC: 187 ug/dL (ref 125–400)

## 2013-04-02 LAB — FERRITIN: Ferritin: 512 ng/mL — ABNORMAL HIGH (ref 22–322)

## 2013-04-02 MED ORDER — OXYCODONE HCL 5 MG PO TABS: 5.0000 mg | ORAL_TABLET | Freq: Four times a day (QID) | ORAL | Status: DC | PRN

## 2013-04-02 MED ORDER — MIDODRINE HCL 5 MG PO TABS
ORAL_TABLET | ORAL | Status: AC
  Administered 2013-04-02: 10 mg
  Filled 2013-04-02: qty 2

## 2013-04-02 MED ORDER — CALCIUM ACETATE 667 MG PO CAPS: 2668.0000 mg | ORAL_CAPSULE | Freq: Three times a day (TID) | ORAL | Status: DC

## 2013-04-02 MED ORDER — FENTANYL CITRATE 0.05 MG/ML IJ SOLN
INTRAMUSCULAR | Status: AC
  Filled 2013-04-02: qty 2

## 2013-04-02 MED ORDER — FENTANYL CITRATE 0.05 MG/ML IJ SOLN
INTRAMUSCULAR | Status: AC
  Administered 2013-04-02: 50 ug via INTRAVENOUS
  Filled 2013-04-02: qty 2

## 2013-04-02 NOTE — Discharge Summary (Signed)
Physician Discharge Summary  Patient ID: [name] MRN: [id number] DOB/AGE: September 07, 1965 47 y.o.  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 Vassie Loll)  Sepsis Chronic Hypotension  Discharge Plan: -midodrine BID  -minimize narcotics as able - note reduced dose below  ESRD  Hyponatremia  Discharge Plan: -follow up with HD in [place] as previously arranged on 11/6 -last HD at [facility] on 11/4 -continue phoslo  Morbid Obesity s/p Gastric Sleeve  Dysphagia  Discharge Plan: -Renal Diet as tolerated -Regular diet, thin liquids  Anemia  Discharge Plan: -follow up H/H with Nephrology at HD  Hyperglycemia - Resolved.    Acute Encephalopathy Delirium  Myoclonic Jerking Deconditioning Chronic Pain  Discharge Plan: -home health PT arranged -reduced dose of oxycodone IR at time of discharge                  DISCHARGE SUMMARY   [name] is a 47 y.o. y/o male with a PMH of DM, Former HTN -now chronic hypotension on midodrine, DVT, ESRD on HD who presented to [facility] 10/25 from the dialysis center with dyspnea, hypoxia, and cough with yellow sputum.  Prior to admit, patient and family noted he had been getting more sleepy with jerky movements.   vasopressors & BiPAP.  Patient was treated with IV antibiotics and anti-virals.  Pan  cultures to date are negative.  Nephrology was consulted in the setting of chronic renal disease.  He remained on intermittent BiPAP and vasopressors until 10/28.  Post HD on 10/28, he was noted to have an episode of dysarthria and decreased alertness that resolved with fluid bolus.  CT of head was negative for acute process and EEG demonstrated disorganized triphasic waves. Hospital course complicated by acute delirium in the setting of critical illness.   Delirium resolved prior to discharge.      SIGNIFICANT EVENTS/Studies:  10/25 - Admit to ICU, renal consulted  10/26 - levo gtt, bipap  10/27 - levo weaned, intermittent BiPAP + bipap qhs (desat on cpap)  10/28 - episode of dysarthria and decreased alertness after HD that resolved with fluid bolus  10/29 - CT head>>>neg acute  10/29 - EEG>>> disorganized tricphasic waves   LINES / TUBES:  Rt IJ CVL 10/26 >>>11/4 Aline 10/26 >>out   CULTURES:  Influenza 10/25 >> negative  MRSA PCR 10/25>> negative  ................................................. Blood culture 10/29 >ng   ANTIBIOTICS:  Azactam 10/25 >>10/28  Vancomycin 10/25 >>10/28  Levaquin 10/25 >>  Tamiflu 10/25 >> 10/26   Discharge Exam: General: morbidly obese male lying in bed no distress  Neuro: alert, left foot drop, otherwise moves ext  Equally, following commands  HEENT: Pupils reactive, EOMI  Cardiovascular: s1 s2 rrr  Lungs: coarse breath sounds bilaterally.  Abdomen: Obese, mild tenderness throughout to palpation  Musculoskeletal: trace to 1+ edema  Skin: No rashes   Filed Vitals:   04/02/13 0449 04/02/13 0500 04/02/13 0736 04/02/13 0756  BP: 113/50   124/59  Pulse: 77  74 78  Temp: 97.4 F (36.3 C)   98.1 F (36.7 C)  TempSrc: Oral   Oral  Resp: 19  16 16   Height:      Weight:  318 lb 9 oz (144.5 kg)    SpO2: 92%  98% 99%    Discharge Labs  BMET  Recent Labs Lab 03/27/13 0500 03/28/13 0500 03/29/13 0500 03/30/13 0325 03/31/13 0526  04/01/13 0600  NA 133* 133* 139 137 138 139  K 4.6 4.9 3.8 3.7 4.0 4.3  CL 96 96 97 97 99 99  CO2 21 18* 23 26 25 25   GLUCOSE 87 88 104* 121* 193* 129*  BUN 37* 59* 32* 47* 36* 44*  CREATININE 3.78* 5.23* 3.35* 4.55* 4.27* 5.65*  CALCIUM 9.0 9.2 9.5 9.2 9.3 9.3  PHOS 4.7* 5.6*  --  4.7* 4.4 4.2   CBC  Recent Labs Lab 03/28/13 0500 03/29/13 0500 03/30/13 0325  HGB 8.0* 8.1* 7.9*  HCT 25.1* 25.6* 24.0*  WBC 12.6* 9.4 8.6  PLT 147* 153 166   Anti-Coagulation  Recent Labs Lab 03/27/13 0930  INR 1.12      Medication List         albuterol 108 (90 BASE) MCG/ACT inhaler  Commonly known as:  PROVENTIL HFA;VENTOLIN HFA  Inhale 2 puffs into the lungs every 6 (six) hours as needed for wheezing.     calcium acetate 667 MG capsule  Commonly known as:  PHOSLO  Take 4 capsules (2,668 mg total) by mouth 3 (three) times daily with meals.     epoetin alfa 95621 UNIT/ML injection  Commonly known as:  EPOGEN,PROCRIT  Inject 6,500 Units into the skin 3 (three) times a week. Tuesday, Thursday, Saturday Dialysis schedule     escitalopram 20 MG tablet  Commonly known as:  LEXAPRO  Take 20 mg by mouth daily.     midodrine 10 MG tablet  Commonly known as:  PROAMATINE  Take 10 mg by mouth 3 (three) times daily.     multivitamin Tabs tablet  Take 1 tablet by mouth daily.     omeprazole 20 MG capsule  Commonly known as:  PRILOSEC  Take 20 mg by mouth 2 (two) times daily.     oxyCODONE 5 MG immediate release tablet  Commonly known as:  Oxy IR/ROXICODONE  Take 1 tablet (5 mg total) by mouth every 6 (six) hours as needed for moderate pain.        Follow-up Information   Follow up with Oretha Milch., MD On 04/23/2013. (Appt at 3:30 - for sleep apnea follow up)    Specialty:  Pulmonary Disease   Contact information:   [address] [place] [address] [phone]       Follow up with Dr. Cyndie Chime. Schedule an appointment as  soon as possible for a visit in 1 week. (call to be seen in 1-2 weeks)       Follow up with Hemodialysis On 04/04/2013. (Follow up as previously arranged)       Follow up with [facility] On 05/02/2013. (Appt at  8 PM.  Arrive at 7:45 PM for check in )    Contact information:   [address] [place] [address] [phone]      Disposition: Home with home health PT.  Hemodialysis as previously arranged.   Discharged Condition: [name] has met maximum benefit of inpatient care and is medically stable and cleared for discharge.  Patient is pending follow up as above.      Time spent on disposition:  Greater than 35 minutes.   Signed: Canary Brim, NP-C [facility] Pgr: [phone] Office: [phone]   Independently examined pt, evaluated data & formulated above care plan with NP who scribed this note & edited by me.  Salim Forero V.

## 2013-04-02 NOTE — Progress Notes (Signed)
Rec'd pt on , no distress noted, no increased WOB noted.  Sats 98-99%.

## 2013-04-02 NOTE — Procedures (Signed)
Pt seen on HD.  Ap 150  Vp 160  SBP 113.  Note plans for DC after HD.

## 2013-04-02 NOTE — Progress Notes (Signed)
Discharge instructions reviewed with patient. Patient verbalized understanding. 

## 2013-04-04 ENCOUNTER — Telehealth: Payer: Self-pay | Admitting: Pulmonary Disease

## 2013-04-04 NOTE — Telephone Encounter (Signed)
Will sign off message. This is not office setting pt

## 2013-04-08 ENCOUNTER — Telehealth: Payer: Self-pay | Admitting: Pulmonary Disease

## 2013-04-08 NOTE — Telephone Encounter (Signed)
Will forward to RA as an FYI 

## 2013-04-23 ENCOUNTER — Encounter: Payer: Self-pay | Admitting: Pulmonary Disease

## 2013-04-23 ENCOUNTER — Ambulatory Visit (INDEPENDENT_AMBULATORY_CARE_PROVIDER_SITE_OTHER): Payer: Medicare Other | Admitting: Pulmonary Disease

## 2013-04-23 VITALS — BP 142/84 | HR 110 | Temp 98.3°F | Ht 72.0 in | Wt 324.0 lb

## 2013-04-23 DIAGNOSIS — G4733 Obstructive sleep apnea (adult) (pediatric): Secondary | ICD-10-CM

## 2013-04-23 DIAGNOSIS — J209 Acute bronchitis, unspecified: Secondary | ICD-10-CM

## 2013-04-23 MED ORDER — ALBUTEROL SULFATE HFA 108 (90 BASE) MCG/ACT IN AERS: 2.0000 | INHALATION_SPRAY | Freq: Four times a day (QID) | RESPIRATORY_TRACT | Status: AC | PRN

## 2013-04-23 MED ORDER — AZITHROMYCIN 250 MG PO TABS: ORAL_TABLET | ORAL | Status: AC

## 2013-04-23 NOTE — Assessment & Plan Note (Signed)
Trial of nasal mask We will make changes to your bipap after the sleep study PSG scheduled for dec 4  Weight loss encouraged, compliance with goal of at least 4-6 hrs every night is the expectation. Advised against medications with sedative side effects Cautioned against driving when sleepy - understanding that sleepiness will vary on a day to day basis

## 2013-04-23 NOTE — Assessment & Plan Note (Signed)
Zpak Albuterol [facility] Rx will be called in - use 2 puffs every 6h as needed Call if no better in 5 days -try to hold off prednisone for now Spirometry in the future -unclear if he has underlying COPD

## 2013-04-23 NOTE — Progress Notes (Signed)
  Subjective:    Patient ID: [name], male    DOB: [birth year], 47 y.o.   MRN: [id number]  HPI 47 y.o. y/o morbidly obese man with a PMH of DM, Former HTN -now chronic hypotension on midodrine, DVT, ESRD on HD who presented to [facility] 03/23/13 from the dialysis center with dyspnea, hypoxia, and cough with yellow sputum. Prior to admit, patient and family noted he had been getting more sleepy with jerky movements.   He remained on intermittent BiPAP and vasopressors until 10/28. Post HD on 10/28, he was noted to have an episode of dysarthria and decreased alertness that resolved with fluid bolus. CT of head was negative for acute process and EEG demonstrated disorganized triphasic waves. Hospital course complicated by acute delirium in the setting of critical illness. Discharged on   -BiPAP 10/5  -patient failed CPAP while inpatient (had episodes of desaturations)  -outpatient sleep study arranged for dec 4th  He reports trouble with FF mask & prefers nasal Reports compliance C/o cough with green phlegm & some wheezing- smoked 20pYrs before he quit in 2010 He used to weigh 800 lbs & was placed on CPAP - lost wt after gastric sleeve & self dc'd CPAP ABGs show mild hypercarbia -compensated  Bedtime 11p, latency , oob 5am on HD days & 10 am other days There is no history suggestive of cataplexy, sleep paralysis or parasomnias   Past Medical History  Diagnosis Date  . Hypertension   . GERD (gastroesophageal reflux disease)   . DVT (deep venous thrombosis)   . Complication of anesthesia     " I am hard  to wake up "  . ESRD on hemodialysis     Dialysis at [facility] in [place], [place]. Started dialysis in 2010. ESRD due to DM/HTN.  . Diabetes mellitus   . ESRD (end stage renal disease) on dialysis 08/24/2011    esrd gets hd in [place] with [facility] on tts schedule, started dialysis in 2010       Review of Systems neg for any significant sore throat,  dysphagia, itching, sneezing, nasal congestion or excess/ purulent secretions, fever, chills, sweats, unintended wt loss, pleuritic or exertional cp, hempoptysis, orthopnea pnd or change in chronic leg swelling. Also denies presyncope, palpitations, heartburn, abdominal pain, nausea, vomiting, diarrhea or change in bowel or urinary habits, dysuria,hematuria, rash, arthralgias, visual complaints, headache, numbness weakness or ataxia.     Objective:   Physical Exam  Gen. Pleasant, obese, in no distress, normal affect ENT - no lesions, no post nasal drip, class 2-3 airway Neck: No JVD, no thyromegaly, no carotid bruits Lungs: no use of accessory muscles, no dullness to percussion, BL faint rhonchi  Cardiovascular: Rhythm regular, heart sounds  normal, no murmurs or gallops, no peripheral edema Abdomen: soft and non-tender, no hepatosplenomegaly, BS normal. Musculoskeletal: No deformities, no cyanosis or clubbing Neuro:  alert, non focal, no tremors       Assessment & Plan:

## 2013-04-23 NOTE — Patient Instructions (Signed)
Zpak Albuterol MDI Rx will be called in - use 2 puffs every 6h as needed Call if no better in 5 days Trial of nasal mask We will make changes to your bipap after the sleep study

## 2013-04-23 NOTE — Addendum Note (Signed)
Addended by: Velvet Bathe on: 04/23/2013 04:30 PM   Modules accepted: Orders

## 2013-05-02 ENCOUNTER — Telehealth: Payer: Self-pay | Admitting: Pulmonary Disease

## 2013-05-02 ENCOUNTER — Ambulatory Visit (HOSPITAL_BASED_OUTPATIENT_CLINIC_OR_DEPARTMENT_OTHER): Payer: Medicare Other

## 2013-05-02 NOTE — Telephone Encounter (Signed)
i spoke with Dr. Vassie Loll. He reports the type of study pt is having done can't be done at home. That is their choice if they can do the study in the lab.   I called and spoke with spouse and is aware. She reports she does not think she will take pt bc she knows how he is and won't be able to do it. She will see. Nothing furhter needed

## 2013-05-17 ENCOUNTER — Telehealth: Payer: Self-pay | Admitting: Pulmonary Disease

## 2013-05-17 DIAGNOSIS — N186 End stage renal disease: Secondary | ICD-10-CM

## 2013-05-17 NOTE — Addendum Note (Signed)
Addended by: Tommie Sams on: 05/17/2013 05:19 PM   Modules accepted: Orders

## 2013-05-17 NOTE — Telephone Encounter (Signed)
Dc home therapy

## 2013-05-17 NOTE — Telephone Encounter (Signed)
Order placed

## 2013-05-17 NOTE — Telephone Encounter (Signed)
Will forward to RA as an FYI 

## 2013-05-20 ENCOUNTER — Telehealth: Payer: Self-pay | Admitting: Pulmonary Disease

## 2013-05-20 NOTE — Telephone Encounter (Signed)
See phone note 05/17/13 reason for d/c order  LMTCB x1 for [facility]

## 2013-05-21 NOTE — Telephone Encounter (Signed)
Cala Bradford returned call.  Antionette Fairy

## 2013-05-21 NOTE — Telephone Encounter (Signed)
I called and spoke with [name]. Made her aware of prior phone note. Nothing further needed

## 2013-07-03 ENCOUNTER — Telehealth: Payer: Self-pay | Admitting: Pulmonary Disease

## 2013-07-03 NOTE — Telephone Encounter (Signed)
[facility] Advised her have not seen this order and will re fax this over. Nothing further needed

## 2013-09-26 ENCOUNTER — Encounter (HOSPITAL_BASED_OUTPATIENT_CLINIC_OR_DEPARTMENT_OTHER): Payer: Self-pay | Admitting: Emergency Medicine

## 2013-09-26 ENCOUNTER — Emergency Department (HOSPITAL_BASED_OUTPATIENT_CLINIC_OR_DEPARTMENT_OTHER)
Admission: EM | Admit: 2013-09-26 | Discharge: 2013-09-26 | Disposition: A | Discharge status: Home / Self Care | Payer: Medicare Other | Attending: Emergency Medicine | Admitting: Emergency Medicine

## 2013-09-26 DIAGNOSIS — K219 Gastro-esophageal reflux disease without esophagitis: Secondary | ICD-10-CM | POA: Insufficient documentation

## 2013-09-26 DIAGNOSIS — Z79899 Other long term (current) drug therapy: Secondary | ICD-10-CM | POA: Insufficient documentation

## 2013-09-26 DIAGNOSIS — Z86718 Personal history of other venous thrombosis and embolism: Secondary | ICD-10-CM | POA: Insufficient documentation

## 2013-09-26 DIAGNOSIS — E119 Type 2 diabetes mellitus without complications: Secondary | ICD-10-CM | POA: Insufficient documentation

## 2013-09-26 DIAGNOSIS — G8929 Other chronic pain: Secondary | ICD-10-CM | POA: Insufficient documentation

## 2013-09-26 DIAGNOSIS — M545 Low back pain, unspecified: Secondary | ICD-10-CM | POA: Insufficient documentation

## 2013-09-26 DIAGNOSIS — N186 End stage renal disease: Secondary | ICD-10-CM | POA: Insufficient documentation

## 2013-09-26 DIAGNOSIS — Z992 Dependence on renal dialysis: Secondary | ICD-10-CM | POA: Insufficient documentation

## 2013-09-26 DIAGNOSIS — F17200 Nicotine dependence, unspecified, uncomplicated: Secondary | ICD-10-CM | POA: Insufficient documentation

## 2013-09-26 MED ORDER — OXYCODONE-ACETAMINOPHEN 5-325 MG PO TABS: 1.0000 | ORAL_TABLET | ORAL | Status: DC | PRN

## 2013-09-26 MED ORDER — METHYLPREDNISOLONE SODIUM SUCC 125 MG IJ SOLR
125.0000 mg | Freq: Once | INTRAMUSCULAR | Status: AC
  Administered 2013-09-26: 125 mg via INTRAMUSCULAR
  Filled 2013-09-26: qty 2

## 2013-09-26 MED ORDER — HYDROMORPHONE HCL PF 2 MG/ML IJ SOLN
2.0000 mg | Freq: Once | INTRAMUSCULAR | Status: AC
  Administered 2013-09-26: 2 mg via INTRAMUSCULAR
  Filled 2013-09-26: qty 1

## 2013-09-26 NOTE — ED Notes (Signed)
C/o lower back pain-worse after being in hosp-d/c 3 days-pt is w/c bound due to left foot drop

## 2013-09-26 NOTE — ED Notes (Signed)
Pt and wife have both checked in as patients to ed, with c/o pain. Both are requesting narcotic pain medications. Pt and wife informed that they must have a ride home in order to both receive pain meds. Pt wife calls daughter, Morrie Sheldonashley, who states she will come to ed right away and drive pt and his wife home. Pt verbalizes understanding that he cannot receive narcotics until his daughter gets here, and is able to drive them both home.

## 2013-09-26 NOTE — Discharge Instructions (Signed)
Percocet as needed for pain.  Followup with your primary Dr. for future refills of your pain medication.   Back Pain, Adult Low back pain is very common. About 1 in 5 people have back pain.The cause of low back pain is rarely dangerous. The pain often gets better over time.About half of people with a sudden onset of back pain feel better in just 2 weeks. About 8 in 10 people feel better by 6 weeks.  CAUSES Some common causes of back pain include:  Strain of the muscles or ligaments supporting the spine.  Wear and tear (degeneration) of the spinal discs.  Arthritis.  Direct injury to the back. DIAGNOSIS Most of the time, the direct cause of low back pain is not known.However, back pain can be treated effectively even when the exact cause of the pain is unknown.Answering your caregiver's questions about your overall health and symptoms is one of the most accurate ways to make sure the cause of your pain is not dangerous. If your caregiver needs more information, he or she may order lab work or imaging tests (X-rays or MRIs).However, even if imaging tests show changes in your back, this usually does not require surgery. HOME CARE INSTRUCTIONS For many people, back pain returns.Since low back pain is rarely dangerous, it is often a condition that people can learn to [facility] their own.   Remain active. It is stressful on the back to sit or stand in one place. Do not sit, drive, or stand in one place for more than 30 minutes at a time. Take short walks on level surfaces as soon as pain allows.Try to increase the length of time you walk each day.  Do not stay in bed.Resting more than 1 or 2 days can delay your recovery.  Do not avoid exercise or work.Your body is made to move.It is not dangerous to be active, even though your back may hurt.Your back will likely heal faster if you return to being active before your pain is gone.  Pay attention to your body when you bend and lift.  Many people have less discomfortwhen lifting if they bend their knees, keep the load close to their bodies,and avoid twisting. Often, the most comfortable positions are those that put less stress on your recovering back.  Find a comfortable position to sleep. Use a firm mattress and lie on your side with your knees slightly bent. If you lie on your back, put a pillow under your knees.  Only take over-the-counter or prescription medicines as directed by your caregiver. Over-the-counter medicines to reduce pain and inflammation are often the most helpful.Your caregiver may prescribe muscle relaxant drugs.These medicines help dull your pain so you can more quickly return to your normal activities and healthy exercise.  Put ice on the injured area.  Put ice in a plastic bag.  Place a towel between your skin and the bag.  Leave the ice on for 15-20 minutes, 03-04 times a day for the first 2 to 3 days. After that, ice and heat may be alternated to reduce pain and spasms.  Ask your caregiver about trying back exercises and gentle massage. This may be of some benefit.  Avoid feeling anxious or stressed.Stress increases muscle tension and can worsen back pain.It is important to recognize when you are anxious or stressed and learn ways to manage it.Exercise is a great option. SEEK MEDICAL CARE IF:  You have pain that is not relieved with rest or medicine.  You have pain  that does not improve in 1 week.  You have new symptoms.  You are generally not feeling well. SEEK IMMEDIATE MEDICAL CARE IF:   You have pain that radiates from your back into your legs.  You develop new bowel or bladder control problems.  You have unusual weakness or numbness in your arms or legs.  You develop nausea or vomiting.  You develop abdominal pain.  You feel faint. Document Released: 05/16/2005 Document Revised: 11/15/2011 Document Reviewed: 10/04/2010 [facility] Patient Information 2014 [place],  [place].

## 2013-09-26 NOTE — ED Provider Notes (Signed)
CSN: [id number]     Arrival date & time 09/26/13  1717 History   First MD Initiated Contact with Patient 09/26/13 1742     Chief Complaint  Patient presents with  . Back Pain     (Consider location/radiation/quality/duration/timing/severity/associated sxs/prior Treatment) HPI Comments: Patient is a 48 year old male with history of morbid obesity, end-stage renal disease on hemodialysis, chronic low back pain. He presents here today with complaints of worsening low back pain in the absence of any injury or trauma. He was recently admitted at [facility] over concerns for possible leakage from his gastric sleeve. He states that he was in a hard bed there for several days and believes this may have exacerbated his pain. He denies any bowel or bladder complaints. He denies any fevers or chills.  He is here with his wife who is also being evaluated for low back pain.  Patient is a 48 y.o. male presenting with back pain. The history is provided by the patient.  Back Pain Location:  Lumbar spine Quality:  Stabbing Radiates to:  Does not radiate Pain severity:  Severe   Past Medical History  Diagnosis Date  . GERD (gastroesophageal reflux disease)   . DVT (deep venous thrombosis)   . Complication of anesthesia     " I am hard  to wake up "  . ESRD on hemodialysis     Dialysis at [facility] in [place], [place]. Started dialysis in 2010. ESRD due to DM/HTN.  . Diabetes mellitus   . ESRD (end stage renal disease) on dialysis 08/24/2011    esrd gets hd in [place] with [facility] on tts schedule, started dialysis in 2010    Past Surgical History  Procedure Laterality Date  . Gastric sleeve    . Kidney stones    . Dialysis port    . Compression hip screw  08/26/2011    Procedure: COMPRESSION HIP;  Surgeon: Velna OchsPeter G Dalldorf, MD;  Location: [facility] OR;  Service: Orthopedics;  Laterality: Left;  DYNAMIC HIP SCREW COMPRESSION  . Ivc filter    . Cholecystostomy tube     No family history  on file. History  Substance Use Topics  . Smoking status: Current Every Day Smoker -- 1.00 packs/day for 20 years    Types: Cigarettes  . Smokeless tobacco: Never Used  . Alcohol Use: No    Review of Systems  Musculoskeletal: Positive for back pain.  All other systems reviewed and are negative.     Allergies  Piperacillin sod-tazobactam so  Home Medications   Prior to Admission medications   Medication Sig Start Date End Date Taking? Authorizing Provider  albuterol (PROVENTIL HFA;VENTOLIN HFA) 108 (90 BASE) MCG/ACT inhaler Inhale 2 puffs into the lungs every 6 (six) hours as needed for wheezing or shortness of breath. 04/23/13   Oretha Milchakesh V Alva, MD  calcium acetate (PHOSLO) 667 MG capsule Take 4 capsules (2,668 mg total) by mouth 3 (three) times daily with meals. 04/02/13   Jeanella CrazeBrandi L Ollis, NP  carisoprodol-aspirin (SOMA COMPOUND) 200-325 MG per tablet Take 1 tablet by mouth 3 (three) times daily as needed for muscle spasms.    Historical Provider, MD  escitalopram (LEXAPRO) 20 MG tablet Take 20 mg by mouth daily.    Historical Provider, MD  midodrine (PROAMATINE) 10 MG tablet Take 10 mg by mouth 2 (two) times daily.     Historical Provider, MD  multivitamin (RENA-VIT) TABS tablet Take 1 tablet by mouth daily.    Historical  Provider, MD  omeprazole (PRILOSEC) 20 MG capsule Take 20 mg by mouth 2 (two) times daily.    Historical Provider, MD  oxyCODONE (OXY IR/ROXICODONE) 5 MG immediate release tablet Take 1 tablet (5 mg total) by mouth every 6 (six) hours as needed for moderate pain. 04/02/13   Jeanella CrazeBrandi L Ollis, NP   BP 121/66  Pulse 94  Temp(Src) 98.1 F (36.7 C) (Oral)  Resp 16  Ht 6\' 1"  (1.854 m)  Wt 340 lb (154.223 kg)  BMI 44.87 kg/m2  SpO2 97% Physical Exam  Nursing note and vitals reviewed. Constitutional: He is oriented to person, place, and time. He appears well-developed and well-nourished. No distress.  HENT:  Head: Normocephalic and atraumatic.  Neck: Normal range of  motion. Neck supple.  Musculoskeletal: Normal range of motion.  There is tenderness to palpation in the lumbar region. The exam is limited due to degree of obesity.  Neurological: He is alert and oriented to person, place, and time.  Strength is 5 out of 5 in the right lower extremity. The patient has a known foot drop on the left and his plantar flexion and dorsiflexion are minimal. I am unable to elicit reflexes bilaterally, this could be related to obesity.  Skin: Skin is warm and dry. He is not diaphoretic.    ED Course  Procedures (including critical care time) Labs Review Labs Reviewed - No data to display  Imaging Review No results found.   EKG Interpretation None      MDM   Final diagnoses:  None    Patient presents here with complaints of a flare up of back pain he believes is related to sleeping on a hard mattress while hospitalized at [facility]. He has a history of chronic low back pain and apparently broke his pain medication contract. He has not taken any pain medications since February. There appears to be nothing acute. He will be given IM pain medication and discharged with Percocet.    Geoffery Lyonsouglas Jarman Litton, MD 09/26/13 [phone]

## 2013-10-06 ENCOUNTER — Emergency Department (HOSPITAL_COMMUNITY)
Admission: EM | Admit: 2013-10-06 | Discharge: 2013-10-07 | Disposition: A | Discharge status: Home / Self Care | Payer: Medicare Other | Attending: Emergency Medicine | Admitting: Emergency Medicine

## 2013-10-06 ENCOUNTER — Emergency Department (HOSPITAL_COMMUNITY): Payer: Medicare Other

## 2013-10-06 DIAGNOSIS — E119 Type 2 diabetes mellitus without complications: Secondary | ICD-10-CM | POA: Insufficient documentation

## 2013-10-06 DIAGNOSIS — N186 End stage renal disease: Secondary | ICD-10-CM | POA: Insufficient documentation

## 2013-10-06 DIAGNOSIS — F17200 Nicotine dependence, unspecified, uncomplicated: Secondary | ICD-10-CM | POA: Insufficient documentation

## 2013-10-06 DIAGNOSIS — R1012 Left upper quadrant pain: Secondary | ICD-10-CM | POA: Insufficient documentation

## 2013-10-06 DIAGNOSIS — R109 Unspecified abdominal pain: Secondary | ICD-10-CM

## 2013-10-06 DIAGNOSIS — Z79899 Other long term (current) drug therapy: Secondary | ICD-10-CM | POA: Insufficient documentation

## 2013-10-06 DIAGNOSIS — Z9884 Bariatric surgery status: Secondary | ICD-10-CM | POA: Insufficient documentation

## 2013-10-06 DIAGNOSIS — Z86718 Personal history of other venous thrombosis and embolism: Secondary | ICD-10-CM | POA: Insufficient documentation

## 2013-10-06 DIAGNOSIS — Z992 Dependence on renal dialysis: Secondary | ICD-10-CM | POA: Insufficient documentation

## 2013-10-06 DIAGNOSIS — E875 Hyperkalemia: Secondary | ICD-10-CM

## 2013-10-06 DIAGNOSIS — K219 Gastro-esophageal reflux disease without esophagitis: Secondary | ICD-10-CM | POA: Insufficient documentation

## 2013-10-06 LAB — COMPREHENSIVE METABOLIC PANEL
ALBUMIN: 3.2 g/dL — AB (ref 3.5–5.2)
ALT: 12 U/L (ref 0–53)
AST: 12 U/L (ref 0–37)
Alkaline Phosphatase: 272 U/L — ABNORMAL HIGH (ref 39–117)
BUN: 94 mg/dL — ABNORMAL HIGH (ref 6–23)
CO2: 21 mEq/L (ref 19–32)
Calcium: 8 mg/dL — ABNORMAL LOW (ref 8.4–10.5)
Chloride: 95 mEq/L — ABNORMAL LOW (ref 96–112)
Creatinine, Ser: 7.06 mg/dL — ABNORMAL HIGH (ref 0.50–1.35)
GFR calc Af Amer: 10 mL/min — ABNORMAL LOW (ref >90)
GFR calc non Af Amer: 8 mL/min — ABNORMAL LOW (ref >90)
Glucose, Bld: 105 mg/dL — ABNORMAL HIGH (ref 70–99)
POTASSIUM: 6 meq/L — AB (ref 3.7–5.3)
SODIUM: 137 meq/L (ref 137–147)
TOTAL PROTEIN: 7.5 g/dL (ref 6.0–8.3)
Total Bilirubin: 0.3 mg/dL (ref 0.3–1.2)

## 2013-10-06 LAB — CBC
HCT: 31.3 % — ABNORMAL LOW (ref 39.0–52.0)
Hemoglobin: 9.9 g/dL — ABNORMAL LOW (ref 13.0–17.0)
MCH: 31.6 pg (ref 26.0–34.0)
MCHC: 31.6 g/dL (ref 30.0–36.0)
MCV: 100 fL (ref 78.0–100.0)
PLATELETS: 169 10*3/uL (ref 150–400)
RBC: 3.13 MIL/uL — AB (ref 4.22–5.81)
RDW: 15.6 % — ABNORMAL HIGH (ref 11.5–15.5)
WBC: 12 10*3/uL — ABNORMAL HIGH (ref 4.0–10.5)

## 2013-10-06 LAB — I-STAT TROPONIN, ED: TROPONIN I, POC: 0 ng/mL (ref 0.00–0.08)

## 2013-10-06 LAB — LIPASE, BLOOD: Lipase: 49 U/L (ref 11–59)

## 2013-10-06 MED ORDER — DEXTROSE 50 % IV SOLN
25.0000 mL | Freq: Once | INTRAVENOUS | Status: AC
  Administered 2013-10-07: 25 mL via INTRAVENOUS
  Filled 2013-10-06: qty 50

## 2013-10-06 MED ORDER — SODIUM CHLORIDE 0.9 % IV SOLN
1.0000 g | Freq: Once | INTRAVENOUS | Status: AC
  Administered 2013-10-07: 1 g via INTRAVENOUS
  Filled 2013-10-06 (×2): qty 10

## 2013-10-06 MED ORDER — IOHEXOL 300 MG/ML  SOLN
20.0000 mL | INTRAMUSCULAR | Status: AC
  Administered 2013-10-06: 25 mL via ORAL

## 2013-10-06 MED ORDER — SODIUM CHLORIDE 0.9 % IV BOLUS (SEPSIS)
1000.0000 mL | Freq: Once | INTRAVENOUS | Status: AC
  Administered 2013-10-06: 1000 mL via INTRAVENOUS

## 2013-10-06 MED ORDER — HYDROMORPHONE HCL PF 1 MG/ML IJ SOLN
1.0000 mg | Freq: Once | INTRAMUSCULAR | Status: AC
  Administered 2013-10-06: 1 mg via INTRAVENOUS
  Filled 2013-10-06: qty 1

## 2013-10-06 MED ORDER — INSULIN ASPART 100 UNIT/ML ~~LOC~~ SOLN
5.0000 [IU] | Freq: Once | SUBCUTANEOUS | Status: AC
  Administered 2013-10-07: 5 [IU] via INTRAVENOUS
  Filled 2013-10-06: qty 1

## 2013-10-06 MED ORDER — ONDANSETRON HCL 4 MG/2ML IJ SOLN: 4.0000 mg | Freq: Once | INTRAMUSCULAR | Status: DC

## 2013-10-06 MED ORDER — PROMETHAZINE HCL 50 MG PO TABS
50.0000 mg | ORAL_TABLET | Freq: Once | ORAL | Status: AC
  Administered 2013-10-06: 50 mg via ORAL
  Filled 2013-10-06: qty 1

## 2013-10-06 MED ORDER — INSULIN ASPART 100 UNIT/ML IV SOLN: 5.0000 [IU] | Freq: Once | INTRAVENOUS | Status: DC

## 2013-10-06 MED ORDER — HYDROMORPHONE HCL PF 1 MG/ML IJ SOLN
2.0000 mg | Freq: Once | INTRAMUSCULAR | Status: AC
  Administered 2013-10-06: 2 mg via INTRAVENOUS
  Filled 2013-10-06: qty 2

## 2013-10-06 NOTE — ED Notes (Signed)
C/o pain and nausea continues. Tolerating PO contrast. C/o taste of contrast. Wants IV phenergan and more pain medicine. [facility].

## 2013-10-06 NOTE — ED Notes (Signed)
Pt to CT. Updated. Pain unchanged. Pending meds from pharmacy.

## 2013-10-06 NOTE — ED Notes (Signed)
Pt alert, NAD, calm, interactive, skin W&D, resps e/u, speaking in clear complete sentences, hoarse voice, c/o LUQ pain, nvd, pain onset 4d ago, h/o similar, h/o abd surgeries with complications, denies vomiting today, "diarrhea was recent, but had a normal BM today", "just pain and nausea now", (denies: fever, sob or dizziness).

## 2013-10-06 NOTE — ED Provider Notes (Signed)
CSN: [id number]     Arrival date & time 10/06/13  2015 History   First MD Initiated Contact with Patient 10/06/13 2018     Chief Complaint  Patient presents with  . Abdominal Pain     (Consider location/radiation/quality/duration/timing/severity/associated sxs/prior Treatment) Patient is a 48 y.o. male presenting with abdominal pain. The history is provided by the patient.  Abdominal Pain Pain location:  LUQ Pain quality: aching and sharp   Pain radiates to:  Does not radiate Pain severity:  Severe Onset quality:  Gradual Duration:  3 days Timing:  Constant Progression:  Worsening Chronicity:  New Context: not eating and not sick contacts   Relieved by:  Nothing Worsened by:  Nothing tried Associated symptoms: anorexia and nausea   Associated symptoms: no chills, no cough, no fever, no shortness of breath and no vomiting     Past Medical History  Diagnosis Date  . GERD (gastroesophageal reflux disease)   . DVT (deep venous thrombosis)   . Complication of anesthesia     " I am hard  to wake up "  . ESRD on hemodialysis     Dialysis at [facility] in [place], [place]. Started dialysis in 2010. ESRD due to DM/HTN.  . Diabetes mellitus   . ESRD (end stage renal disease) on dialysis 08/24/2011    esrd gets hd in [place] with [facility] on tts schedule, started dialysis in 2010    Past Surgical History  Procedure Laterality Date  . Gastric sleeve    . Kidney stones    . Dialysis port    . Compression hip screw  08/26/2011    Procedure: COMPRESSION HIP;  Surgeon: Velna Ochs, MD;  Location: [facility] OR;  Service: Orthopedics;  Laterality: Left;  DYNAMIC HIP SCREW COMPRESSION  . Ivc filter    . Cholecystostomy tube     No family history on file. History  Substance Use Topics  . Smoking status: Current Every Day Smoker -- 1.00 packs/day for 20 years    Types: Cigarettes  . Smokeless tobacco: Never Used  . Alcohol Use: No    Review of Systems   Constitutional: Negative for fever and chills.  Respiratory: Negative for cough and shortness of breath.   Gastrointestinal: Positive for nausea, abdominal pain and anorexia. Negative for vomiting.  All other systems reviewed and are negative.     Allergies  Piperacillin sod-tazobactam so  Home Medications   Prior to Admission medications   Medication Sig Start Date End Date Taking? Authorizing Provider  albuterol (PROVENTIL HFA;VENTOLIN HFA) 108 (90 BASE) MCG/ACT inhaler Inhale 2 puffs into the lungs every 6 (six) hours as needed for wheezing or shortness of breath. 04/23/13   Oretha Milch, MD  calcium acetate (PHOSLO) 667 MG capsule Take 4 capsules (2,668 mg total) by mouth 3 (three) times daily with meals. 04/02/13   Jeanella Craze, NP  carisoprodol-aspirin (SOMA COMPOUND) 200-325 MG per tablet Take 1 tablet by mouth 3 (three) times daily as needed for muscle spasms.    Historical Provider, MD  escitalopram (LEXAPRO) 20 MG tablet Take 20 mg by mouth daily.    Historical Provider, MD  midodrine (PROAMATINE) 10 MG tablet Take 10 mg by mouth 2 (two) times daily.     Historical Provider, MD  multivitamin (RENA-VIT) TABS tablet Take 1 tablet by mouth daily.    Historical Provider, MD  omeprazole (PRILOSEC) 20 MG capsule Take 20 mg by mouth 2 (two) times daily.  Historical Provider, MD  oxyCODONE (OXY IR/ROXICODONE) 5 MG immediate release tablet Take 1 tablet (5 mg total) by mouth every 6 (six) hours as needed for moderate pain. 04/02/13   Jeanella CrazeBrandi L Ollis, NP  oxyCODONE-acetaminophen (PERCOCET) 5-325 MG per tablet Take 1-2 tablets by mouth every 4 (four) hours as needed. 09/26/13   Geoffery Lyonsouglas Delo, MD   SpO2 100% Physical Exam  Constitutional: He is oriented to person, place, and time. He appears well-developed and well-nourished. No distress.  HENT:  Head: Normocephalic and atraumatic.  Mouth/Throat: No oropharyngeal exudate.  Eyes: EOM are normal. Pupils are equal, round, and reactive to  light.  Neck: Normal range of motion. Neck supple.  Cardiovascular: Normal rate and regular rhythm.  Exam reveals no friction rub.   No murmur heard. Pulmonary/Chest: Effort normal and breath sounds normal. No respiratory distress. He has no wheezes. He has no rales.  Abdominal: He exhibits no distension. There is tenderness (LUQ). There is no rebound. A hernia (central, at inferior central scar, reducible) is present.  Musculoskeletal: Normal range of motion. He exhibits no edema.  Neurological: He is alert and oriented to person, place, and time.  Skin: He is not diaphoretic.    ED Course  Procedures (including critical care time) Labs Review Labs Reviewed  CBC - Abnormal; Notable for the following:    WBC 12.0 (*)    RBC 3.13 (*)    Hemoglobin 9.9 (*)    HCT 31.3 (*)    RDW 15.6 (*)    All other components within normal limits  COMPREHENSIVE METABOLIC PANEL - Abnormal; Notable for the following:    Potassium 6.0 (*)    Chloride 95 (*)    Glucose, Bld 105 (*)    BUN 94 (*)    Creatinine, Ser 7.06 (*)    Calcium 8.0 (*)    Albumin 3.2 (*)    Alkaline Phosphatase 272 (*)    GFR calc non Af Amer 8 (*)    GFR calc Af Amer 10 (*)    All other components within normal limits  LIPASE, BLOOD  URINALYSIS, ROUTINE W REFLEX MICROSCOPIC  I-STAT TROPOININ, ED    Imaging Review Ct Abdomen Pelvis Wo Contrast  10/07/2013   ADDENDUM REPORT: 10/07/2013 00:38  ADDENDUM: There postsurgical changes in the stomach identified. They are stable from the prior exam.   Electronically Signed   By: Alcide CleverMark  Lukens M.D.   On: 10/07/2013 00:38   10/07/2013   CLINICAL DATA:  Left upper quadrant pain  EXAM: CT ABDOMEN AND PELVIS WITHOUT CONTRAST  TECHNIQUE: Multidetector CT imaging of the abdomen and pelvis was performed following the standard protocol without IV contrast.  COMPARISON:  04/05/2012  FINDINGS: Lung bases demonstrate scarring on the left as sequelae of previous infiltrate.  The liver is within  normal limits. Gallbladder is not visualized on this exam. The spleen, adrenal glands and pancreas are unremarkable. Left kidney demonstrates no evidence renal calculi or urinary tract obstructive changes. The right kidney is significantly deformed and shows perinephric fluid consistent with sequelae from prior perinephric hematoma. An IVC filter is noted in place. Scattered diverticular change is noted without evidence of diverticulitis. The bladder is decompressed. No pelvic mass lesion is seen. No gross surrounding soft tissue abnormality is noted. The bony structures are unremarkable. Postsurgical changes are noted in the left hip. Left anterior abdominal wall hernia is with loops of colon within although no incarceration is noted.  IMPRESSION: Sequelae from prior right perinephric hematoma. No  obstructive changes are noted bilaterally.  Chronic scarring and left lung base.  Few small hernias are noted in the left anterior abdominal wall. Loops of colon extend into these hernias but do not appear to be incarcerated.  Electronically Signed: By: Alcide CleverMark  Lukens M.D. On: 10/07/2013 00:12     EKG Interpretation   Date/Time:  Monday Oct 07 2013 00:31:11 EDT Ventricular Rate:  83 PR Interval:  196 QRS Duration: 92 QT Interval:  412 QTC Calculation: 484 R Axis:   84 Text Interpretation:  Age not entered, assumed to be  48 years old for  purpose of ECG interpretation Sinus rhythm Low voltage, precordial leads  Abnormal R-wave progression, late transition ST elev, probable normal  early repol pattern Borderline prolonged QT interval Similar to prior  Confirmed by [facility]  MD, Theran Vandergrift ([id number]) on 10/07/2013 12:36:39 AM      MDM   Final diagnoses:  Abdominal wall pain  Hyperkalemia    78M with hx of morbid obesity, ESRD presents with abdominal pain. LUQ pain, nausea, anorexia for past few days. Hx of gastric sleeve procedure at [facility]. Denies fevers, SOB, CP. Has been completing dialysis  treatments as prescribed.  On exam, morbidly obese, soft central hernia, reducible. LUQ pain with firm abdomen in that quadrant. Due to obesity, exam is somewhat unreliable, will CT scan patient. Labs drawn, pain and nausea meds given.  CT scan normal, I spoke with the radiologist who noted nothing wrong with the gastric sleeve. On re-exam, L anterior abdominal wall tender. Small hernias noted on CT, no incarceration. These areas on exam are easily reducible. Likely having some pain from hernias. Will give Percocet. Has dialsysi tmw. Had elevated K here, temporized. No EKG changes. Has dialysis tomorrow in the morning, no need for urgent dialysis now. Patient stable for discharge, no acute findings to warrant admission. Patient given pain meds to go home. On re-exam, patient was straining and felt the pain. He feels this pain with straining with bowel movements sometimes. Will give percocet to go home. Stable for discharge. Kayexalate given for his mildly high potassium.   I have reviewed all labs and imaging and considered them in my medical decision making.   Dagmar HaitWilliam Nora Sabey, MD 10/07/13 [phone]

## 2013-10-06 NOTE — ED Notes (Signed)
Pt to ED via [facility] with c/o abd pain with nausea.  Pt was given Zofran 8mg  PTA

## 2013-10-06 NOTE — ED Notes (Signed)
Pharmacy called for calcium gluconate. Pt to CT at this time.

## 2013-10-07 MED ORDER — OXYCODONE-ACETAMINOPHEN 5-325 MG PO TABS: 1.0000 | ORAL_TABLET | Freq: Four times a day (QID) | ORAL | Status: DC | PRN

## 2013-10-07 MED ORDER — SODIUM POLYSTYRENE SULFONATE 15 GM/60ML PO SUSP
30.0000 g | Freq: Once | ORAL | Status: AC
  Administered 2013-10-07: 30 g via ORAL
  Filled 2013-10-07: qty 120

## 2013-10-07 MED ORDER — HYDROMORPHONE HCL PF 1 MG/ML IJ SOLN
1.0000 mg | Freq: Once | INTRAMUSCULAR | Status: AC
  Administered 2013-10-07: 1 mg via INTRAVENOUS
  Filled 2013-10-07: qty 1

## 2013-10-07 MED ORDER — OXYCODONE-ACETAMINOPHEN 5-325 MG PO TABS
2.0000 | ORAL_TABLET | Freq: Once | ORAL | Status: AC
  Administered 2013-10-07: 2 via ORAL
  Filled 2013-10-07: qty 2

## 2013-10-07 NOTE — ED Notes (Signed)
Alert, NAD, calm, interactive, speech clear, out with wife.

## 2013-10-07 NOTE — ED Notes (Signed)
Dr. Gwendolyn GrantWalden in to see, assess and update pt.

## 2013-10-07 NOTE — Discharge Instructions (Signed)
Abdominal Pain, Adult °Many things can cause abdominal pain. Usually, abdominal pain is not caused by a disease and will improve without treatment. It can often be observed and treated at home. Your health care provider will do a physical exam and possibly order blood tests and X-rays to help determine the seriousness of your pain. However, in many cases, more time must pass before a clear cause of the pain can be found. Before that point, your health care provider may not know if you need more testing or further treatment. °HOME CARE INSTRUCTIONS  °Monitor your abdominal pain for any changes. The following actions may help to alleviate any discomfort you are experiencing: °· Only take over-the-counter or prescription medicines as directed by your health care provider. °· Do not take laxatives unless directed to do so by your health care provider. °· Try a clear liquid diet (broth, tea, or water) as directed by your health care provider. Slowly move to a bland diet as tolerated. °SEEK MEDICAL CARE IF: °· You have unexplained abdominal pain. °· You have abdominal pain associated with nausea or diarrhea. °· You have pain when you urinate or have a bowel movement. °· You experience abdominal pain that wakes you in the night. °· You have abdominal pain that is worsened or improved by eating food. °· You have abdominal pain that is worsened with eating fatty foods. °SEEK IMMEDIATE MEDICAL CARE IF:  °· Your pain does not go away within 2 hours. °· You have a fever. °· You keep throwing up (vomiting). °· Your pain is felt only in portions of the abdomen, such as the right side or the left lower portion of the abdomen. °· You pass bloody or black tarry stools. °MAKE SURE YOU: °· Understand these instructions.   °· Will watch your condition.   °· Will get help right away if you are not doing well or get worse.   °Document Released: 02/23/2005 Document Revised: 03/06/2013 Document Reviewed: 01/23/2013 °ExitCare® Patient  Information ©2014 [employer]. ° °

## 2013-10-13 ENCOUNTER — Emergency Department (HOSPITAL_COMMUNITY): Payer: Medicare Other

## 2013-10-13 ENCOUNTER — Emergency Department (HOSPITAL_COMMUNITY)
Admission: EM | Admit: 2013-10-13 | Discharge: 2013-10-14 | Disposition: A | Discharge status: Home / Self Care | Payer: Medicare Other | Attending: Emergency Medicine | Admitting: Emergency Medicine

## 2013-10-13 ENCOUNTER — Encounter (HOSPITAL_COMMUNITY): Payer: Self-pay | Admitting: Emergency Medicine

## 2013-10-13 DIAGNOSIS — E875 Hyperkalemia: Secondary | ICD-10-CM

## 2013-10-13 DIAGNOSIS — Z79899 Other long term (current) drug therapy: Secondary | ICD-10-CM | POA: Insufficient documentation

## 2013-10-13 DIAGNOSIS — F17200 Nicotine dependence, unspecified, uncomplicated: Secondary | ICD-10-CM | POA: Insufficient documentation

## 2013-10-13 DIAGNOSIS — Z992 Dependence on renal dialysis: Secondary | ICD-10-CM | POA: Insufficient documentation

## 2013-10-13 DIAGNOSIS — M545 Low back pain, unspecified: Secondary | ICD-10-CM | POA: Insufficient documentation

## 2013-10-13 DIAGNOSIS — R109 Unspecified abdominal pain: Secondary | ICD-10-CM

## 2013-10-13 DIAGNOSIS — E119 Type 2 diabetes mellitus without complications: Secondary | ICD-10-CM | POA: Insufficient documentation

## 2013-10-13 DIAGNOSIS — Z86718 Personal history of other venous thrombosis and embolism: Secondary | ICD-10-CM | POA: Insufficient documentation

## 2013-10-13 DIAGNOSIS — N186 End stage renal disease: Secondary | ICD-10-CM

## 2013-10-13 DIAGNOSIS — R1012 Left upper quadrant pain: Secondary | ICD-10-CM | POA: Insufficient documentation

## 2013-10-13 DIAGNOSIS — Z9884 Bariatric surgery status: Secondary | ICD-10-CM | POA: Insufficient documentation

## 2013-10-13 NOTE — ED Notes (Signed)
Pt to ED via [facility].  Pt is scheduled to have dialysis tomorrow (MWF patient)- pt is requesting to have dialysis done tonight because the chair at dialysis is uncomfortable due to his chronic back pain.  Pt also c/o of weeping wound behind left thigh.  Alert and oriented X 4.

## 2013-10-14 LAB — I-STAT CHEM 8, ED
BUN: 118 mg/dL — ABNORMAL HIGH (ref 6–23)
Calcium, Ion: 0.99 mmol/L — ABNORMAL LOW (ref 1.12–1.23)
Chloride: 99 mEq/L (ref 96–112)
Creatinine, Ser: 8.2 mg/dL — ABNORMAL HIGH (ref 0.50–1.35)
Glucose, Bld: 120 mg/dL — ABNORMAL HIGH (ref 70–99)
HEMATOCRIT: 32 % — AB (ref 39.0–52.0)
HEMOGLOBIN: 10.9 g/dL — AB (ref 13.0–17.0)
POTASSIUM: 5.8 meq/L — AB (ref 3.7–5.3)
Sodium: 132 mEq/L — ABNORMAL LOW (ref 137–147)
TCO2: 24 mmol/L (ref 0–100)

## 2013-10-14 LAB — CBC
HEMATOCRIT: 31.4 % — AB (ref 39.0–52.0)
Hemoglobin: 9.9 g/dL — ABNORMAL LOW (ref 13.0–17.0)
MCH: 31.2 pg (ref 26.0–34.0)
MCHC: 31.5 g/dL (ref 30.0–36.0)
MCV: 99.1 fL (ref 78.0–100.0)
Platelets: 143 10*3/uL — ABNORMAL LOW (ref 150–400)
RBC: 3.17 MIL/uL — ABNORMAL LOW (ref 4.22–5.81)
RDW: 15.3 % (ref 11.5–15.5)
WBC: 10.2 10*3/uL (ref 4.0–10.5)

## 2013-10-14 MED ORDER — OXYCODONE-ACETAMINOPHEN 5-325 MG PO TABS
2.0000 | ORAL_TABLET | Freq: Once | ORAL | Status: AC
  Administered 2013-10-14: 2 via ORAL
  Filled 2013-10-14: qty 2

## 2013-10-14 MED ORDER — SODIUM POLYSTYRENE SULFONATE 15 GM/60ML PO SUSP: 30.0000 g | Freq: Once | ORAL | Status: DC

## 2013-10-14 NOTE — ED Notes (Signed)
Patient states that he has an open wound on his left upper thigh.

## 2013-10-14 NOTE — ED Notes (Signed)
C/o lower back pain, and thinks his back hurts so bad he might not make it to dialysis. States he just needs pain meds for his back and then maybe he can go.

## 2013-10-14 NOTE — Discharge Instructions (Signed)
Dialysis Dialysis is a procedure that replaces some of the work healthy kidneys do. It is done when you lose about 85 90% of your kidney function. It may also be done earlier if your symptoms may be improved by dialysis. During dialysis, wastes, salt, and extra water are removed from the blood, and the levels of certain chemicals in the blood (such as potassium) are maintained. Dialysis is done in sessions. Dialysis sessions are continued until the kidneys get better. If the kidneys cannot get better, such as in end-stage kidney disease, dialysis is continued for life or until you receive a new kidney (kidney transplant). There are two types of dialysis: hemodialysis and peritoneal dialysis. HEMODIALYSIS  Hemodialysis is a type of dialysis in which a machine called a dialyzer is used to filter the blood. Before beginning hemodialysis, you will have surgery to create a site where blood can be removed from the body and returned to the body (vascular access). There are three types of vascular accesses:  Arteriovenous fistula. To create this type of access, an artery is connected to a vein (usually in the arm). A fistula takes 1 6 months to develop after surgery. If it develops properly, it usually lasts longer than the other types of vascular accesses. It is also less likely to become infected and cause blood clots.  Arteriovenous graft. To create this type of access, an artery and a vein in the arm are connected with a tube. A graft may be used within 2 3 weeks of surgery.  A venous catheter. To create this type of access, a thin, flexible tube (catheter) is placed in a large vein in your neck, chest, or groin. A catheter may be used right away. It is usually used as a temporary access when dialysis needs to begin immediately. During hemodialysis, blood leaves the body through your access. It travels through a tube to the dialyzer, where it is filtered. The blood then returns to your body through another  tube. Hemodialysis is usually performed by a caregiver at a hospital or dialysis center three times a week. Visits last about 3 4 hours. It may also be performed with the help of another person at home with training.  PERITONEAL DIALYSIS Peritoneal dialysis is a type of dialysis in which the thin lining of the abdomen (peritoneum) is used as a filter. Before beginning peritoneal dialysis, you will have surgery to place a catheter in your abdomen. The catheter will be used to transfer a fluid called dialysate to and from your abdomen. At the start of a session, your abdomen is filled with dialysate. During the session, wastes, salt, and extra water in the blood pass through the peritoneum and into the dialysate. The dialysate is drained from the body at the end of the session. The process of filling and draining the dialysate is called an exchange. Exchanges are repeated until you have used up all the dialysate for the day. Peritoneal dialysis may be performed by you at home or at almost any other location. It is done every day. You may need up to five exchanges a day. The amount of time the dialysate is in your body between exchanges is called a dwell. The dwell depends on the number of exchanges needed and the characteristics of the peritoneum. It usually varies from 1.5 3 hours. You may go about your day normally between exchanges. Alternately, the exchanges may be done at night while you sleep using a machine called a cycler. CHOOSING HEMODIALYSIS OR  PERITONEAL DIALYSIS  Both hemodialysis and peritoneal dialysis have advantages and disadvantages. Talk to your caregiver about which type of dialysis would be best for you. Your lifestyle and preferences should be considered along with your medical condition. In some cases, only one type of dialysis may be an option.  Advantages of hemodialysis  It is done less often than peritoneal dialysis.  Someone else can do the dialysis for you.  If you go to a  dialysis center, your caregiver will be able to recognize any problems right away.  If you go to a dialysis center, you can interact with others who are having dialysis. This can provide you with emotional support. Disadvantages of hemodialysis  Hemodialysis may cause cramps and low blood pressure. It may leave you feeling tired on the days you have the treatment.  If you go to a dialysis center, you will need to make weekly appointments and work around the center's schedule.  You will need to take extra care when traveling. If you go to a dialysis center, you will need to make special arrangements to visit a dialysis center near your destination. If you are having treatments at home, you will need to take the dialyzer with you to your destination.  You will need to avoid more foods than you would need to avoid on peritoneal dialysis. Advantages of peritoneal dialysis  It is less likely than hemodialysis to cause cramps and low blood pressure.  You may do exchanges on your own wherever you are, including when you travel.  You do not need to avoid as many foods as you do on hemodialysis. Disadvantages of Peritoneal Dialysis  It is done more often than hemodialysis.  Performing peritoneal dialysis requires you to have dexterity of the hands. You must also be able to lift bags.  You will have to learn sterilization techniques. You will need to practice them every day to reduce the risk of infection. DIALYSIS DIET Both hemodialysis and peritoneal dialysis require you to make some changes to your diet. For example, you will need to limit your intake of foods high in the minerals phosphorus and potassium. You will also need to limit your fluid intake. Your dietitian can help you plan meals. A good meal plan can improve your dialysis and your health.  WHAT TO EXPECT  Adjusting to the dialysis treatment, schedule, and diet can take some time. You may need to stop working and may not be able to  do some of the things you normally do. You may feel anxious or depressed when beginning dialysis. Eventually, many people feel better overall because of dialysis. Some people are able to return to work after making some changes, such as reducing work intensity. FOR MORE INFORMATION  National Kidney Foundation: www.kidney.org American Association of Kidney Patients: ResidentialShow.is  American Kidney Fund: www.kidneyfund.org Document Released: 08/06/2002 Document Revised: 01/16/2013 Document Reviewed: 07/10/2012 [facility] Patient Information 2014 [place], [place].  End-Stage Kidney Disease The kidneys are two organs that lie on either side of the spine between the middle of the back and the front of the abdomen. The kidneys:   Remove wastes and extra water from the blood.   Produce important hormones. These help keep bones strong, regulate blood pressure, and help create red blood cells.   Balance the fluids and chemicals in the blood and tissues. End-stage kidney disease occurs when the kidneys are so damaged that they cannot do their job. When the kidneys cannot do their job, life-threatening problems occur. The body  cannot stay clean and strong without the help of the kidneys. In end-stage kidney disease, the kidneys cannot get better.You need a new kidney or treatments to do some of the work healthy kidneys do in order to stay alive. CAUSES  End-stage kidney disease usually occurs when a long-lasting (chronic) kidney disease gets worse. It may also occur after the kidneys are suddenly damaged (acute kidney injury).  SYMPTOMS   Swelling (edema) of the legs, ankles, or feet.   Tiredness (lethargy).   Nausea or vomiting.   Confusion.   Problems with urination, such as:   Decreased urine production.   Frequent urination, especially at night.   Frequent accidents in children who are potty trained.   Muscle twitches and cramps.   Persistent itchiness.   Loss of appetite.    Headaches.   Abnormally dark or light skin.   Numbness in the hands or feet.   Easy bruising.   Frequent hiccups.   Menstruation stops. DIAGNOSIS  Your caregiver will measure your blood pressure and take some tests. These may include:   Urine tests.   Blood tests.   Imaging tests, such as:   An ultrasound exam.   Computed tomography (CT).  A kidney biopsy. TREATMENT  There are two treatments for end-stage kidney disease:   A procedure that removes toxic wastes from the body (dialysis).   Receiving a new kidney (kidney transplant). Both of these treatments have serious risks and consequences. Your caregiver will help you determine which treatment is best for you based on your health, age, and other factors. In addition to having dialysis or a kidney transplant, you may need to take medicines to control high blood pressure (hypertension) and cholesterol and to decrease phosphorus levels in your blood.  HOME CARE INSTRUCTIONS  Follow your prescribed diet.   Only take over-the-counter or prescription medicines as directed by your caregiver.   Do not take any new medicines (prescription, over-the-counter, or nutritional supplements) unless approved by your caregiver. Many medicines can worsen your kidney damage or need to have the dose adjusted.   Keep all follow-up appointments. MAKE SURE YOU:  Understand these instructions.  Will watch your condition.  Will get help right away if you are not doing well or get worse Document Released: 08/06/2003 Document Revised: 05/02/2012 Document Reviewed: 01/13/2012 [facility] Patient Information 2014 [place], [place].  Abdominal Pain, Adult Many things can cause belly (abdominal) pain. Most times, the belly pain is not dangerous. Many cases of belly pain can be watched and treated at home. HOME CARE   Do not take medicines that help you go poop (laxatives) unless told to by your doctor.  Only take medicine as  told by your doctor.  Eat or drink as told by your doctor. Your doctor will tell you if you should be on a special diet. GET HELP IF:  You do not know what is causing your belly pain.  You have belly pain while you are sick to your stomach (nauseous) or have runny poop (diarrhea).  You have pain while you pee or poop.  Your belly pain wakes you up at night.  You have belly pain that gets worse or better when you eat.  You have belly pain that gets worse when you eat fatty foods. GET HELP RIGHT AWAY IF:   The pain does not go away within 2 hours.  You have a fever.  You keep throwing up (vomiting).  The pain changes and is only in the right or  left part of the belly.  You have bloody or tarry looking poop. MAKE SURE YOU:   Understand these instructions.  Will watch your condition.  Will get help right away if you are not doing well or get worse. Document Released: 11/02/2007 Document Revised: 03/06/2013 Document Reviewed: 01/23/2013 [facility] Patient Information 2014 [place], [place].  Abdominal Pain, Adult Many things can cause belly (abdominal) pain. Most times, the belly pain is not dangerous. Many cases of belly pain can be watched and treated at home. HOME CARE   Do not take medicines that help you go poop (laxatives) unless told to by your doctor.  Only take medicine as told by your doctor.  Eat or drink as told by your doctor. Your doctor will tell you if you should be on a special diet. GET HELP IF:  You do not know what is causing your belly pain.  You have belly pain while you are sick to your stomach (nauseous) or have runny poop (diarrhea).  You have pain while you pee or poop.  Your belly pain wakes you up at night.  You have belly pain that gets worse or better when you eat.  You have belly pain that gets worse when you eat fatty foods. GET HELP RIGHT AWAY IF:   The pain does not go away within 2 hours.  You have a fever.  You keep throwing  up (vomiting).  The pain changes and is only in the right or left part of the belly.  You have bloody or tarry looking poop. MAKE SURE YOU:   Understand these instructions.  Will watch your condition.  Will get help right away if you are not doing well or get worse. Document Released: 11/02/2007 Document Revised: 03/06/2013 Document Reviewed: 01/23/2013 [facility] Patient Information 2014 [place], [place].

## 2013-10-14 NOTE — ED Notes (Signed)
ptar called 

## 2013-10-14 NOTE — ED Provider Notes (Signed)
CSN: [id number]     Arrival date & time 10/13/13  2335 History   First MD Initiated Contact with Patient 10/13/13 2344     Chief Complaint  Patient presents with  . Requesting Dialysis      (Consider location/radiation/quality/duration/timing/severity/associated sxs/prior Treatment) HPI History provided by patient. End stage renal disease with dialysis Monday Wednesday Friday. last dialysis on Friday as scheduled. [place] [facility]. Patient has been having left upper quadrant pain for the last few weeks. He was evaluated at 10/06/2013 for this and had a CT scan which patient states was normal. He continues to have persistent pain despite taking Percocet at home. Patient states she's also been having ongoing low back pain it hurts to sit in a dialysis chair for any extended period of time. He called EMS tonight to be brought in the hospital for ongoing abdominal pain and is here requesting to have dialysis in the hospital, so that he does not have dialysis in a chair that causes him back pain. With his low back pain, no weakness or numbness. No radiating pain. No F/C. No trauma or fall.   Past Medical History  Diagnosis Date  . GERD (gastroesophageal reflux disease)   . DVT (deep venous thrombosis)   . Complication of anesthesia     " I am hard  to wake up "  . ESRD on hemodialysis     [place] [facility] [place] dialysis in [place]. ESRD due to DM/HTN.  . Diabetes mellitus   . ESRD (end stage renal disease) on dialysis 08/24/2011    esrd gets hd in [place] with [facility] on tts schedule, started dialysis in 2010    Past Surgical History  Procedure Laterality Date  . Gastric sleeve    . Kidney stones    . Dialysis port    . Compression hip screw  08/26/2011    Procedure: COMPRESSION HIP;  Surgeon: Velna OchsPeter G Dalldorf, MD;  Location: [facility] OR;  Service: Orthopedics;  Laterality: Left;  DYNAMIC HIP SCREW  COMPRESSION  . Ivc filter    . Cholecystostomy tube     No family history on file. History  Substance Use Topics  . Smoking status: Current Every Day Smoker -- 1.00 packs/day for 20 years    Types: Cigarettes  . Smokeless tobacco: Never Used  . Alcohol Use: No    Review of Systems  Constitutional: Negative for fever and chills.  Eyes: Negative for visual disturbance.  Respiratory: Negative for shortness of breath.   Cardiovascular: Negative for chest pain.  Gastrointestinal: Positive for abdominal pain. Negative for nausea, vomiting and constipation.  Genitourinary: Negative for flank pain.  Musculoskeletal: Positive for back pain. Negative for neck pain and neck stiffness.  Skin: Negative for rash.  Neurological: Negative for headaches.  All other systems reviewed and are negative.     Allergies  Piperacillin sod-tazobactam so  Home Medications   Prior to Admission medications   Medication Sig Start Date End Date Taking? Authorizing Provider  albuterol (PROVENTIL HFA;VENTOLIN HFA) 108 (90 BASE) MCG/ACT inhaler Inhale 2 puffs into the lungs every 6 (six) hours as needed for wheezing or shortness of breath. 04/23/13   Oretha Milchakesh V Alva, MD  ALPRAZolam Prudy Feeler(XANAX) 1 MG tablet Take 1 mg by mouth 3 (three) times daily as needed for anxiety.    Historical Provider, MD  calcium acetate (PHOSLO) 667 MG capsule Take 4 capsules (2,668 mg total)  by mouth 3 (three) times daily with meals. 04/02/13   Jeanella CrazeBrandi L Ollis, NP  cinacalcet (SENSIPAR) 30 MG tablet Take 30 mg by mouth daily.    Historical Provider, MD  escitalopram (LEXAPRO) 20 MG tablet Take 10 mg by mouth daily.     Historical Provider, MD  midodrine (PROAMATINE) 10 MG tablet Take 10 mg by mouth 2 (two) times daily.     Historical Provider, MD  multivitamin (RENA-VIT) TABS tablet Take 1 tablet by mouth daily.    Historical Provider, MD  omeprazole (PRILOSEC) 20 MG capsule Take 20 mg by mouth 2 (two) times daily.    Historical Provider,  MD  oxyCODONE-acetaminophen (PERCOCET) 5-325 MG per tablet Take 1 tablet by mouth every 6 (six) hours as needed for moderate pain. 10/07/13   Dagmar HaitWilliam Blair Walden, MD  RENVELA 800 MG tablet Take 2,400 mg by mouth 3 (three) times daily. 10/02/13   Historical Provider, MD   BP 121/55  Pulse 90  Temp(Src) 97.8 F (36.6 C) (Oral)  Resp 18  Ht 6\' 1"  (1.854 m)  Wt 340 lb (154.223 kg)  BMI 44.87 kg/m2  SpO2 98% Physical Exam  Constitutional: He is oriented to person, place, and time. He appears well-developed and well-nourished.  HENT:  Head: Atraumatic.  Eyes: EOM are normal. Pupils are equal, round, and reactive to light.  Neck: Neck supple.  Cardiovascular: Normal rate, regular rhythm and intact distal pulses.   Pulmonary/Chest: Effort normal and breath sounds normal. No respiratory distress. He exhibits no tenderness.  Abdominal: Soft. Bowel sounds are normal. He exhibits no distension. There is no tenderness. There is no rebound and no guarding.  Morbidly obese with limited exam. No ABD tenderness with deep palpation throughout  Musculoskeletal: Normal range of motion.  Neurological: He is alert and oriented to person, place, and time.  Skin: Skin is warm and dry. No rash noted.    ED Course  Procedures (including critical care time) Labs Review Labs Reviewed  CBC - Abnormal; Notable for the following:    RBC 3.17 (*)    Hemoglobin 9.9 (*)    HCT 31.4 (*)    Platelets 143 (*)    All other components within normal limits  I-STAT CHEM 8, ED - Abnormal; Notable for the following:    Sodium 132 (*)    Potassium 5.8 (*)    BUN 118 (*)    Creatinine, Ser 8.20 (*)    Glucose, Bld 120 (*)    Calcium, Ion 0.99 (*)    Hemoglobin 10.9 (*)    HCT 32.0 (*)    All other components within normal limits   Room air pulse ox 98% is adequate  Imaging Review Dg Chest Portable 1 View  10/14/2013   CLINICAL DATA:  esrd  EXAM: PORTABLE CHEST - 1 VIEW  COMPARISON:  DG CHEST 1V PORT dated  04/02/2013  FINDINGS: Low lung volumes. Cardiac silhouette is enlarged. There is prominence of interstitial markings and peribronchial cuffing. Linear areas increased density within the lung bases. No acute osseous abnormalities.  IMPRESSION: Stable cardiomegaly  Scarring versus atelectasis in lung bases  Pulmonary edema, mild go   Electronically Signed   By: Salome HolmesHector  Cooper M.D.   On: 10/14/2013 01:01     Date: 10/14/2013  Rate: 87  Rhythm: normal sinus rhythm  QRS Axis: normal  Intervals: normal and QT prolonged  ST/T Wave abnormalities: nonspecific ST changes  Conduction Disutrbances:none  Narrative Interpretation:   Old EKG Reviewed: unchanged  Percocet provided for low back pain Kayexalate for K 5.8, plan dialysis this am 1:54 AM d/w Dr Marisue Humble as above, on call for Renal.  He recommends have PT get to scheduled dialysis today - does not require emergency dialysis.   Plan d/c home, f/u in am for scheduled dialysis at [facility] [place]. PT agrees to return precautions and f/u instructions  MDM   Dx: ESRD, hyperkalemia  Presents requesting dialysis. Also 2 weeks of ongoing LUQ pain with recent CT scan as below.  Pain medication provided - symptoms improved. No hypoxia or resp distress.  VS, old records and nurses notes reviewed   10/07/2013 CLINICAL DATA: Left upper quadrant pain EXAM: CT ABDOMEN AND PELVIS WITHOUT CONTRAST TECHNIQUE: Multidetector CT imaging of the abdomen and pelvis was performed following the standard protocol without IV contrast. COMPARISON: 04/05/2012 FINDINGS: Lung bases demonstrate scarring on the left as sequelae of previous infiltrate. The liver is within normal limits. Gallbladder is not visualized on this exam. The spleen, adrenal glands and pancreas are unremarkable. Left kidney demonstrates no evidence renal calculi or urinary tract obstructive changes. The right kidney is significantly deformed and shows perinephric fluid consistent with sequelae from prior  perinephric hematoma. An IVC filter is noted in place. Scattered diverticular change is noted without evidence of diverticulitis. The bladder is decompressed. No pelvic mass lesion is seen. No gross surrounding soft tissue abnormality is noted. The bony structures are unremarkable. Postsurgical changes are noted in the left hip. Left anterior abdominal wall hernia is with loops of colon within although no incarceration is noted. IMPRESSION: Sequelae from prior right perinephric hematoma. No obstructive changes are noted bilaterally. Chronic scarring and left lung base. Few small hernias are noted in the left anterior abdominal wall. Loops of colon extend into these hernias but do not appear to be incarcerated. Electronically Signed: By: Alcide Clever M.D. On: 10/07/2013 00:12    Sunnie Nielsen, MD 10/14/13 [phone]

## 2013-10-14 NOTE — ED Notes (Signed)
Patient is resting comfortably. 

## 2013-10-24 ENCOUNTER — Encounter (HOSPITAL_COMMUNITY): Payer: Self-pay | Admitting: Emergency Medicine

## 2013-10-24 ENCOUNTER — Emergency Department (HOSPITAL_COMMUNITY): Payer: Medicare Other

## 2013-10-24 ENCOUNTER — Inpatient Hospital Stay (HOSPITAL_COMMUNITY)
Admission: EM | Admit: 2013-10-24 | Discharge: 2013-10-24 | DRG: 314 | Discharge status: Against Medical Advice | Payer: Medicare Other | Attending: Internal Medicine | Admitting: Internal Medicine

## 2013-10-24 DIAGNOSIS — G8929 Other chronic pain: Secondary | ICD-10-CM | POA: Diagnosis present

## 2013-10-24 DIAGNOSIS — Z888 Allergy status to other drugs, medicaments and biological substances status: Secondary | ICD-10-CM

## 2013-10-24 DIAGNOSIS — M216X9 Other acquired deformities of unspecified foot: Secondary | ICD-10-CM | POA: Diagnosis present

## 2013-10-24 DIAGNOSIS — E1129 Type 2 diabetes mellitus with other diabetic kidney complication: Secondary | ICD-10-CM | POA: Diagnosis present

## 2013-10-24 DIAGNOSIS — Z86718 Personal history of other venous thrombosis and embolism: Secondary | ICD-10-CM

## 2013-10-24 DIAGNOSIS — I959 Hypotension, unspecified: Secondary | ICD-10-CM | POA: Diagnosis present

## 2013-10-24 DIAGNOSIS — D638 Anemia in other chronic diseases classified elsewhere: Secondary | ICD-10-CM | POA: Diagnosis present

## 2013-10-24 DIAGNOSIS — Z9119 Patient's noncompliance with other medical treatment and regimen: Secondary | ICD-10-CM

## 2013-10-24 DIAGNOSIS — Z91199 Patient's noncompliance with other medical treatment and regimen due to unspecified reason: Secondary | ICD-10-CM

## 2013-10-24 DIAGNOSIS — G4733 Obstructive sleep apnea (adult) (pediatric): Secondary | ICD-10-CM | POA: Diagnosis present

## 2013-10-24 DIAGNOSIS — I9589 Other hypotension: Principal | ICD-10-CM | POA: Diagnosis present

## 2013-10-24 DIAGNOSIS — I120 Hypertensive chronic kidney disease with stage 5 chronic kidney disease or end stage renal disease: Secondary | ICD-10-CM | POA: Diagnosis present

## 2013-10-24 DIAGNOSIS — F329 Major depressive disorder, single episode, unspecified: Secondary | ICD-10-CM

## 2013-10-24 DIAGNOSIS — Z9884 Bariatric surgery status: Secondary | ICD-10-CM

## 2013-10-24 DIAGNOSIS — K219 Gastro-esophageal reflux disease without esophagitis: Secondary | ICD-10-CM | POA: Diagnosis present

## 2013-10-24 DIAGNOSIS — L98499 Non-pressure chronic ulcer of skin of other sites with unspecified severity: Secondary | ICD-10-CM | POA: Diagnosis present

## 2013-10-24 DIAGNOSIS — Z992 Dependence on renal dialysis: Secondary | ICD-10-CM | POA: Diagnosis present

## 2013-10-24 DIAGNOSIS — D649 Anemia, unspecified: Secondary | ICD-10-CM

## 2013-10-24 DIAGNOSIS — N2581 Secondary hyperparathyroidism of renal origin: Secondary | ICD-10-CM | POA: Diagnosis present

## 2013-10-24 DIAGNOSIS — N186 End stage renal disease: Secondary | ICD-10-CM | POA: Diagnosis present

## 2013-10-24 DIAGNOSIS — M545 Low back pain, unspecified: Secondary | ICD-10-CM | POA: Diagnosis present

## 2013-10-24 DIAGNOSIS — Z87442 Personal history of urinary calculi: Secondary | ICD-10-CM

## 2013-10-24 DIAGNOSIS — F17200 Nicotine dependence, unspecified, uncomplicated: Secondary | ICD-10-CM | POA: Diagnosis present

## 2013-10-24 DIAGNOSIS — F3289 Other specified depressive episodes: Secondary | ICD-10-CM | POA: Diagnosis present

## 2013-10-24 LAB — BASIC METABOLIC PANEL
BUN: 43 mg/dL — ABNORMAL HIGH (ref 6–23)
CALCIUM: 8.6 mg/dL (ref 8.4–10.5)
CHLORIDE: 96 meq/L (ref 96–112)
CO2: 25 mEq/L (ref 19–32)
Creatinine, Ser: 4.75 mg/dL — ABNORMAL HIGH (ref 0.50–1.35)
GFR calc non Af Amer: 14 mL/min — ABNORMAL LOW (ref >90)
GFR, EST AFRICAN AMERICAN: 16 mL/min — AB (ref >90)
Glucose, Bld: 101 mg/dL — ABNORMAL HIGH (ref 70–99)
Potassium: 5.2 mEq/L (ref 3.7–5.3)
Sodium: 137 mEq/L (ref 137–147)

## 2013-10-24 LAB — COMPREHENSIVE METABOLIC PANEL
ALBUMIN: 3.1 g/dL — AB (ref 3.5–5.2)
ALK PHOS: 272 U/L — AB (ref 39–117)
ALT: 9 U/L (ref 0–53)
AST: 11 U/L (ref 0–37)
BILIRUBIN TOTAL: 0.5 mg/dL (ref 0.3–1.2)
BUN: 42 mg/dL — ABNORMAL HIGH (ref 6–23)
CHLORIDE: 96 meq/L (ref 96–112)
CO2: 23 mEq/L (ref 19–32)
Calcium: 8.5 mg/dL (ref 8.4–10.5)
Creatinine, Ser: 4.7 mg/dL — ABNORMAL HIGH (ref 0.50–1.35)
GFR calc Af Amer: 16 mL/min — ABNORMAL LOW (ref >90)
GFR calc non Af Amer: 14 mL/min — ABNORMAL LOW (ref >90)
Glucose, Bld: 99 mg/dL (ref 70–99)
POTASSIUM: 5.2 meq/L (ref 3.7–5.3)
Sodium: 138 mEq/L (ref 137–147)
Total Protein: 7.3 g/dL (ref 6.0–8.3)

## 2013-10-24 LAB — CBC
HEMATOCRIT: 32.7 % — AB (ref 39.0–52.0)
Hemoglobin: 10.1 g/dL — ABNORMAL LOW (ref 13.0–17.0)
MCH: 31.4 pg (ref 26.0–34.0)
MCHC: 30.9 g/dL (ref 30.0–36.0)
MCV: 101.6 fL — ABNORMAL HIGH (ref 78.0–100.0)
PLATELETS: 211 10*3/uL (ref 150–400)
RBC: 3.22 MIL/uL — ABNORMAL LOW (ref 4.22–5.81)
RDW: 16.5 % — AB (ref 11.5–15.5)
WBC: 11.1 10*3/uL — AB (ref 4.0–10.5)

## 2013-10-24 LAB — LACTIC ACID, PLASMA: Lactic Acid, Venous: 1.2 mmol/L (ref 0.5–2.2)

## 2013-10-24 MED ORDER — HYDROCORTISONE NA SUCCINATE PF 100 MG IJ SOLR
50.0000 mg | Freq: Four times a day (QID) | INTRAMUSCULAR | Status: DC
  Administered 2013-10-24: 50 mg via INTRAVENOUS
  Filled 2013-10-24: qty 1
  Filled 2013-10-24: qty 2
  Filled 2013-10-24 (×2): qty 1

## 2013-10-24 MED ORDER — FLUDROCORTISONE ACETATE 0.1 MG PO TABS
0.1000 mg | ORAL_TABLET | Freq: Two times a day (BID) | ORAL | Status: DC
  Filled 2013-10-24: qty 1

## 2013-10-24 MED ORDER — RENA-VITE PO TABS: 1.0000 | ORAL_TABLET | Freq: Every day | ORAL | Status: DC

## 2013-10-24 MED ORDER — PHENYLEPHRINE HCL 10 MG/ML IJ SOLN: 0.1000 mg | Freq: Once | INTRAMUSCULAR | Status: DC

## 2013-10-24 MED ORDER — OXYCODONE-ACETAMINOPHEN 5-325 MG PO TABS
1.0000 | ORAL_TABLET | Freq: Three times a day (TID) | ORAL | Status: DC | PRN
  Filled 2013-10-24: qty 1

## 2013-10-24 MED ORDER — HYDROMORPHONE HCL PF 1 MG/ML IJ SOLN
0.5000 mg | Freq: Once | INTRAMUSCULAR | Status: DC
  Filled 2013-10-24: qty 1

## 2013-10-24 MED ORDER — PHENYLEPHRINE HCL 10 MG/ML IJ SOLN: 0.1000 mg | Freq: Once | INTRAMUSCULAR | Status: DC | PRN

## 2013-10-24 MED ORDER — MIDODRINE HCL 5 MG PO TABS
10.0000 mg | ORAL_TABLET | Freq: Once | ORAL | Status: AC
  Administered 2013-10-24: 10 mg via ORAL
  Filled 2013-10-24: qty 2

## 2013-10-24 MED ORDER — PHENYLEPHRINE HCL 10 MG/ML IJ SOLN
INTRAVENOUS | Status: DC | PRN
  Administered 2013-10-24: 15:00:00 via INTRAVENOUS
  Filled 2013-10-24: qty 100

## 2013-10-24 MED ORDER — HYDROCORTISONE NICU INJ SYRINGE 50 MG/ML: 50.0000 mg | Freq: Four times a day (QID) | INTRAVENOUS | Status: DC

## 2013-10-24 MED ORDER — PHENYLEPHRINE HCL 10 MG/ML IJ SOLN
INTRAMUSCULAR | Status: DC | PRN
  Administered 2013-10-24: 13:00:00 via INTRAVENOUS
  Filled 2013-10-24: qty 100

## 2013-10-24 MED ORDER — HYDROMORPHONE HCL PF 1 MG/ML IJ SOLN
0.5000 mg | Freq: Once | INTRAMUSCULAR | Status: AC
  Administered 2013-10-24: 0.5 mg via INTRAVENOUS

## 2013-10-24 NOTE — ED Notes (Signed)
Leaving AMA explained to the pt and informed that his medical condition at this time is near critical and that place him at very hight risk if he to leave the hospital now. Pt convinced to stay. Pt taken to the floor. Dr. Dalphine Handing paged.

## 2013-10-24 NOTE — Consult Note (Signed)
[facility] Renal Consultation Note  Indication for Consultation:  Management of ESRD/hemodialysis; anemia, hypertension/volume and secondary hyperparathyroidism  HPI: Nell Schrack is a 48 y.o. male with ESRD reported secondary to HTN/DM followed by [place] [facility] at [facility] MWF/ Morbid Obesity"850Lbs pre Gastric Sleeve surgery" now "[id number]"([id number]) went /" called at home " to go attend op hd for extra tx for volume removal today and sent to [facility] (not [facility] " My Wife is pt here")secondary to Hypotension in 61s' prehd.He reports being dizzy at the center ,But drove to op hd in his [place] Lei. Admitted by teaching service for evaluation / stabilization. He denies fevers, chills, sob, chest pain,n/v/d,  Does report some left foot pain and hip pain with hx hip fx from fall 07/2011.Also, reports being on Midodrine 61m bid and recently increased to TID. Takes some pain meds but denies before hd.  Past Medical History  Diagnosis Date  . GERD (gastroesophageal reflux disease)   . DVT (deep venous thrombosis)   . Complication of anesthesia     " I am hard  to wake up "  . ESRD on hemodialysis     Dialysis at [facility] [place] Started dialysis in 2010. ESRD due to DM/HTN.  . Diabetes mellitus   . ESRD (end stage renal disease) on dialysis 08/24/2011    esrd gets hd in [place] with [facility] on tts schedule, started dialysis in 2010     Past Surgical History  Procedure Laterality Date  . Gastric sleeve    . Kidney stones    . Dialysis port    . Compression hip screw  08/26/2011    Procedure: COMPRESSION HIP;  Surgeon: PHessie Dibble MD;  [place]  Service: Orthopedics;  Laterality: Left;  DYNAMIC HIP SCREW COMPRESSION  . Ivc filter    . Cholecystostomy tube       social ' married lives with wife , have 4 children, sister is poa.and    reports that he has been smoking Cigarettes.  He has a 20 pack-year smoking history. He  has never used smokeless tobacco. He reports that he does not drink alcohol or use illicit drugs.   Allergies  Allergen Reactions  . Piperacillin Sod-Tazobactam So Hives    Prior to Admission medications   Medication Sig Start Date End Date Taking? Authorizing Provider  albuterol (PROVENTIL HFA;VENTOLIN HFA) 108 (90 BASE) MCG/ACT inhaler Inhale 2 puffs into the lungs every 6 (six) hours as needed for wheezing or shortness of breath. 04/23/13  Yes RRigoberto Noel MD  calcium acetate (PHOSLO) 667 MG capsule Take 1,334-2,001 mg by mouth See admin instructions. Three tablets with all meals and two tablets with snacks   Yes Historical Provider, MD  cephALEXin (KEFLEX) 250 MG capsule Take 250 mg by mouth 2 (two) times daily. For 10 days 10/22/13  Yes Historical Provider, MD  cinacalcet (SENSIPAR) 30 MG tablet Take 30 mg by mouth daily.   Yes Historical Provider, MD  clonazePAM (KLONOPIN) 0.5 MG tablet Take 0.5 mg by mouth 2 (two) times daily as needed for anxiety.  10/10/13  Yes Historical Provider, MD  escitalopram (LEXAPRO) 20 MG tablet Take 10 mg by mouth daily.    Yes Historical Provider, MD  folic acid (FOLVITE) 1 MG tablet Take 1 mg by mouth daily.   Yes Historical Provider, MD  gabapentin (NEURONTIN) 100 MG capsule Take 100 mg by mouth 2 (two) times  daily.    Yes Historical Provider, MD  midodrine (PROAMATINE) 10 MG tablet Take 10 mg by mouth 3 (three) times daily.    Yes Historical Provider, MD  multivitamin (RENA-VIT) TABS tablet Take 1 tablet by mouth daily.   Yes Historical Provider, MD  omeprazole (PRILOSEC) 20 MG capsule Take 20 mg by mouth 2 (two) times daily.   Yes Historical Provider, MD  promethazine (PHENERGAN) 25 MG tablet Take 25 mg by mouth every 8 (eight) hours as needed for nausea.  08/23/13  Yes Historical Provider, MD  RENVELA 800 MG tablet Take 2,400 mg by mouth 3 (three) times daily. 10/02/13  Yes Historical Provider, MD  senna (SENOKOT) 8.6 MG TABS tablet Take 1 tablet by mouth  daily.   Yes Historical Provider, MD  traZODone (DESYREL) 50 MG tablet Take 50 mg by mouth at bedtime. 10/10/13  Yes Historical Provider, MD    [id number]  Results for orders placed during the hospital encounter of 10/24/13 (from the past 48 hour(s))  BASIC METABOLIC PANEL     Status: Abnormal   Collection Time    10/24/13  9:50 AM      Result Value Ref Range   Sodium 137  137 - 147 mEq/L   Potassium 5.2  3.7 - 5.3 mEq/L   Chloride 96  96 - 112 mEq/L   CO2 25  19 - 32 mEq/L   Glucose, Bld 101 (*) 70 - 99 mg/dL   BUN 43 (*) 6 - 23 mg/dL   Creatinine, Ser 4.75 (*) 0.50 - 1.35 mg/dL   Calcium 8.6  8.4 - 10.5 mg/dL   GFR calc non Af Amer 14 (*) >90 mL/min   GFR calc Af Amer 16 (*) >90 mL/min   Comment: (NOTE)     The eGFR has been calculated using the CKD EPI equation.     This calculation has not been validated in all clinical situations.     eGFR's persistently <90 mL/min signify possible Chronic Kidney     Disease.  CBC     Status: Abnormal   Collection Time    10/24/13  9:50 AM      Result Value Ref Range   WBC 11.1 (*) 4.0 - 10.5 K/uL   RBC 3.22 (*) 4.22 - 5.81 MIL/uL   Hemoglobin 10.1 (*) 13.0 - 17.0 g/dL   HCT 32.7 (*) 39.0 - 52.0 %   MCV 101.6 (*) 78.0 - 100.0 fL   MCH 31.4  26.0 - 34.0 pg   MCHC 30.9  30.0 - 36.0 g/dL   RDW 16.5 (*) 11.5 - 15.5 %   Platelets 211  150 - 400 K/uL  COMPREHENSIVE METABOLIC PANEL     Status: Abnormal   Collection Time    10/24/13  9:50 AM      Result Value Ref Range   Sodium 138  137 - 147 mEq/L   Potassium 5.2  3.7 - 5.3 mEq/L   Chloride 96  96 - 112 mEq/L   CO2 23  19 - 32 mEq/L   Glucose, Bld 99  70 - 99 mg/dL   BUN 42 (*) 6 - 23 mg/dL   Creatinine, Ser 4.70 (*) 0.50 - 1.35 mg/dL   Calcium 8.5  8.4 - 10.5 mg/dL   Total Protein 7.3  6.0 - 8.3 g/dL   Albumin 3.1 (*) 3.5 - 5.2 g/dL   AST 11  0 - 37 U/L   ALT 9  0 - 53 U/L   Alkaline  Phosphatase 272 (*) 39 - 117 U/L   Total Bilirubin 0.5  0.3 - 1.2 mg/dL   GFR  calc non Af Amer 14 (*) >90 mL/min   GFR calc Af Amer 16 (*) >90 mL/min   Comment: (NOTE)     The eGFR has been calculated using the CKD EPI equation.     This calculation has not been validated in all clinical situations.     eGFR's persistently <90 mL/min signify possible Chronic Kidney     Disease.  LACTIC ACID, PLASMA     Status: None   Collection Time    10/24/13  1:30 PM      Result Value Ref Range   Lactic Acid, Venous 1.2  0.5 - 2.2 mmol/L     ROS:  See hpi   Physical Exam: Filed Vitals:   10/24/13 1645  BP: 107/47  Pulse: 77  Temp:   Resp: 20     General:In er morbidly Obese wm. Nad, alert talkative HEENT: Sharon, MMM, Eyes: nonicteric Neck: no jvd Heart: RRR, no mur, rub ,or gallop Lungs:  CTA bilat. With distant sounds with morbid obesity Abdomen: Morbid obesity, bs pos. Nontender, nondistended Extremities: bipedal 2 +edema Skin: healed surgical scars abd ,avf,/ no overt rash Neuro: L foot drop, moves all extrem . OX4 Dialysis Access:L FA avf pos. bruit  Dialysis Orders: Center: [facility] on MWF . EDW 344lbs =156.5 kg  HD Bath   Time 4hrs 7mn Heparin . Access L fa avf BFR   DFR      [employer]   mcg IV/HD Epogen     Units IV/HD  Venofer     Other    Assessment/Plan 1. Hypotension- etiology wu with Admit team 2. ESRD -  MWF HD( [facility] follows) folllow up labs pre// 5.2 K 3. Hypertension/volume  - Vol up by exam but not sob, or hypoxic/ attempt hd uf in am  As tolerated after bp tx 4. Anemia  - HGb 10.1 Check his op unit for esa dosing 5. Metabolic bone disease -  Check his op center for vit d records/ binderas needed wityj food / ca/phos fu  6. Morbid Obesity Sp gastric Sleeve surgery 7. Depression - Meds per admit reports "[facility]" 8. OSA- CPAP per admit  DErnest Haber PA-C [id number]/28/2015, 4:58 PM

## 2013-10-24 NOTE — H&P (Signed)
Date: 10/24/2013               Patient Name:  [name] MRN: [id number]  DOB: 1966/03/12 Age / Sex: 48 y.o., male   PCP: Pcp Not In System         Medical Service: Internal Medicine Teaching Service         Attending Physician: Dr. Pennie Banter    First Contact: Dr. Johna Roles Pager: [phone]  Second Contact: Dr. Shirlee Latch Pager: [phone]       After Hours (After 5p/  First Contact Pager: [phone]  weekends / holidays): Second Contact Pager: [phone]   Chief Complaint: Hypotension   History of Present Illness:   48 year old man with a past medical history of end-stage renal disease on HD (M./W./F.), s/p of gastric sleeve surgery with complications, left foot drop, chronic left hip pain, possible AI, who presents with hypotension.   Recently, the patient did have HD regularly His last dialysis was on Friday, which was prematurely discontinued after 3 hours due to low blood pressure. His body weight increased by more than 20 pounds per patient. He went to the [place] dialysis center for trying to remove some fluid this morning. During the process of preparation for dialysis, patient was found to have low blood pressure. His blood pressure drop to about SBP of 70 mmHg. HD could not be done. He was transferred to [facility] hospital emergency room for further evaluation and treatment. When the patient was evaluated in the ED, he has mild dizziness and mild nausea. Patient does not have confusion and answered all questions appropriately. He denies chest pain, shortness of breath, vomiting and diarrhea. Patient was supposed to take Midodrine 10 milligram 3 times a day at home, but the patient is not very sure whether he has been taking his medications.   Of note, he had a history of gastric sleeve surgery with a lot of complications, including gallstone, possible cholecystitis, and Bear trap placement, but he does not have abdominal pain today. No fever or chills.    Current  Facility-Administered Medications  Medication Dose Route Frequency Provider Last Rate Last Dose  . fludrocortisone (FLORINEF) tablet 0.1 mg  0.1 mg Oral BID Lorretta Harp, MD      . hydrocortisone sodium succinate (SOLU-CORTEF) 100 MG injection 50 mg  50 mg Intravenous Q6H Nelia Shi, MD   50 mg at 10/24/13 1439  . phenylephrine (NEO-SYNEPHRINE) injection 0.1 mg  0.1 mg Intravenous Once Lorretta Harp, MD       Current Outpatient Prescriptions  Medication Sig Dispense Refill  . albuterol (PROVENTIL HFA;VENTOLIN HFA) 108 (90 BASE) MCG/ACT inhaler Inhale 2 puffs into the lungs every 6 (six) hours as needed for wheezing or shortness of breath.  1 Inhaler  6  . calcium acetate (PHOSLO) 667 MG capsule Take 1,334-2,001 mg by mouth See admin instructions. Three tablets with all meals and two tablets with snacks      . cephALEXin (KEFLEX) 250 MG capsule Take 250 mg by mouth 2 (two) times daily. For 10 days      . cinacalcet (SENSIPAR) 30 MG tablet Take 30 mg by mouth daily.      . clonazePAM (KLONOPIN) 0.5 MG tablet Take 0.5 mg by mouth 2 (two) times daily as needed for anxiety.       Marland Kitchen escitalopram (LEXAPRO) 20 MG tablet Take 10 mg by mouth daily.       . folic acid (FOLVITE) 1 MG tablet Take 1 mg  by mouth daily.      Marland Kitchen gabapentin (NEURONTIN) 100 MG capsule Take 100 mg by mouth 2 (two) times daily.       . midodrine (PROAMATINE) 10 MG tablet Take 10 mg by mouth 3 (three) times daily.       . multivitamin (RENA-VIT) TABS tablet Take 1 tablet by mouth daily.      Marland Kitchen omeprazole (PRILOSEC) 20 MG capsule Take 20 mg by mouth 2 (two) times daily.      . promethazine (PHENERGAN) 25 MG tablet Take 25 mg by mouth every 8 (eight) hours as needed for nausea.       Marland Kitchen RENVELA 800 MG tablet Take 2,400 mg by mouth 3 (three) times daily.      Marland Kitchen senna (SENOKOT) 8.6 MG TABS tablet Take 1 tablet by mouth daily.      . traZODone (DESYREL) 50 MG tablet Take 50 mg by mouth at bedtime.        Allergies: Allergies as of  10/24/2013 - Review Complete 10/24/2013  Allergen Reaction Noted  . Piperacillin sod-tazobactam so Hives 08/24/2011   Past Medical History  Diagnosis Date  . GERD (gastroesophageal reflux disease)   . DVT (deep venous thrombosis)   . Complication of anesthesia     " I am hard  to wake up "  . ESRD on hemodialysis     Dialysis at [facility] in [place], [place]. Started dialysis in 2010. ESRD due to DM/HTN.  . Diabetes mellitus   . ESRD (end stage renal disease) on dialysis 08/24/2011    esrd gets hd in [place] with [facility] on tts schedule, started dialysis in 2010    Past Surgical History  Procedure Laterality Date  . Gastric sleeve    . Kidney stones    . Dialysis port    . Compression hip screw  08/26/2011    Procedure: COMPRESSION HIP;  Surgeon: Velna Ochs, MD;  Location: [facility] OR;  Service: Orthopedics;  Laterality: Left;  DYNAMIC HIP SCREW COMPRESSION  . Ivc filter    . Cholecystostomy tube     No family history on file. History   Social History  . Marital Status: Married    Spouse Name: N/A    Number of Children: N/A  . Years of Education: N/A   Occupational History  . Not on file.   Social History Main Topics  . Smoking status: Current Every Day Smoker -- 1.00 packs/day for 20 years    Types: Cigarettes  . Smokeless tobacco: Never Used  . Alcohol Use: No  . Drug Use: No  . Sexual Activity: Not on file   Other Topics Concern  . Not on file   Social History Narrative  . No narrative on file    Review of Systems: Full 14-point review of systems otherwise negative except as noted above in HPI.   Physical Exam: Blood pressure 79/24, pulse 76, temperature 98 F (36.7 C), temperature source Oral, resp. rate 17, SpO2 96.00%.  General: Not in acute distress.  HEENT: PERRL, EOMI, no scleral icterus, No JVD or bruit  Cardiac: S1/S2, RRR, No murmurs, gallops or rubs  Pulm: Good air movement bilaterally. Clear to auscultation bilaterally.  No rales, wheezing, rhonchi or rubs.  Abd: Morbidly obese with limited exam. No ABD tenderness, no rebound pain, no organomegaly, BS present. Has three surgical scars on the abdominal wall. There is small (~5 cm) hernia just below the largest surgical scar which is reproducible  without tenderness.  Ext: No edema. 2+DP/PT pulse bilaterally  Musculoskeletal: has left foot drop and lower back pain.  Skin: No rashes.  Neuro: Alert and oriented X3, cranial nerves II-XII grossly intact, there is left leg weakness and left foot drop, muscle strength normal in other extremities, Sensation to light touch intact. Brachial reflex 2+ bilaterally.  Psych: Patient is not psychotic, no suicidal or hemocidal ideation.   Lab results: Basic Metabolic Panel:  Recent Labs  16/02/9604/28/15 0950  NA 137  138  K 5.2  5.2  CL 96  96  CO2 25  23  GLUCOSE 101*  99  BUN 43*  42*  CREATININE 4.75*  4.70*  CALCIUM 8.6  8.5   Liver Function Tests:  Recent Labs  10/24/13 0950  AST 11  ALT 9  ALKPHOS 272*  BILITOT 0.5  PROT 7.3  ALBUMIN 3.1*   CBC:  Recent Labs  10/24/13 0950  WBC 11.1*  HGB 10.1*  HCT 32.7*  MCV 101.6*  PLT 211    Imaging results:  Dg Chest Portable 1 View  10/24/2013   CLINICAL DATA:  Hypertension, pain.  EXAM: PORTABLE CHEST - 1 VIEW  COMPARISON:  10/14/2013  FINDINGS: Cardiomegaly. Continued density at the left base/lingula most compatible with scarring or atelectasis. Right lung is clear. Mild vascular congestion. This is improved slightly since prior study. No visible effusions. No acute bony abnormality.  IMPRESSION: Stable density at the left lung base, likely scarring or atelectasis.  Cardiomegaly, improving vascular congestion   Electronically Signed   By: Charlett NoseKevin  Dover M.D.   On: 10/24/2013 10:28    Other results: EKG: None available   Assessment & Plan by Problem:  Acute on Chronic Hypotension - Pt with recent volume overload and to have extra HD session today  [facility] but was not done due to hypotension. BP on admission to ED of BP 89/40 with low of 59/38. Pt with chronic hypotension with baseline blood pressure 90/40's. Pt's home midodrine was recently increased which he is unsure if he was taking properly. No concern for septic, cardiogenic, or hypovolemic shock. Unclear if pt with history of adrenal insufficiency. Pt with history of retroperitoneal bleed. Pt currently without tachycardia and mild symptoms of lightheadedness. Pt received in ED solucortef 50 mg injection x 1, midodrine 10 mg x 1, and phenylephrine 0.1 mg x 2.  -Pt did not meet criteria for ICU admission -Monitor on telemetry and obtain 12-lead EKG -Continue home midodrine 10 mg TID  -Start fludrocortisone 0.1 mg BID  -Start hydrocortisone 50 mg q 6hr  -Phenylephrine 0.1 mg PRN -Obtain cortisol levels and monitor lactic acid level       ESRD on HD - [facility] sleeve procedure in 2010.    -Consult nephrology -HD per nephrology -Continue home phoslo 1334-2001 mg TID with meals -Continue home Renelva 2400 mg TID  -Continue home sensipar 20 mg daily   Normocytic Anemia - Pt with Hg near baseline of 10 with no active bleeding. Last anemia panel on 04/02/13 with ferritin 512 (H), iron 50, TIBC 237, Iron Sat 215.  -Monitor for bleeding -Monitor CBC, transfuse if Hg<7  Left Sacral Wound Ulcer - Unable to assess.  Pt reports recently prescribed keflex but has not taken it yet. -Consult wound care -Resume home keflex 250 mg BID x 10 days if indicated   Depression - Currently with stable mood -Continue home  lexapro 10 mg daily -Continue home trazodone 50 mg daily  -Continue home gabapentin 100 mg BID  GERD - Currently without acid reflux symptoms. Pt at home on prilosec 20 mg BID   -Protonix 40 mg daily  Diet: Renal with fluid restriction DVT Ppx: SQ heparin  Code: Full   Dispo: Disposition is deferred at  this time, awaiting improvement of current medical problems.  The patient does have a current PCP (Pcp Not In System) and does need an [facility] hospital follow-up appointment after discharge.  The patient does not have transportation limitations that hinder transportation to clinic appointments.  Signed: Otis Brace, MD 10/24/2013, 2:53 PM

## 2013-10-24 NOTE — ED Notes (Signed)
To ED via [facility] from Dialysis center in [facility]-- after having an episode of hypotension, received 500cc bolus at center- without response. Dialysis M, W, F-- was there today to take "off 10 pounds" -- dry weight =344#, weight today 364#. Pt non ambulatory, drove self to dialysis center-- transfers self with slide board,

## 2013-10-24 NOTE — ED Notes (Signed)
Report attempted x 1

## 2013-10-24 NOTE — ED Notes (Addendum)
Pt states he wants to leave AMA because Dr. Dalphine Handing will not prescribe IV dilaudid. Pt informed that his BP is low and IV dilaudid will drop it down further. Pt was given 0.5 mg dilaudid and BP-77/31. Dr. Dalphine Handing paged.

## 2013-10-24 NOTE — ED Notes (Addendum)
Spoke with admitting Dr. Dalphine Handing to notified that pt needs pain med and he wants to be placed on C-PAP now.  Dr. Dalphine Handing states he will placed orders and pt can go on C-PAP now as he requested.

## 2013-10-24 NOTE — Consult Note (Signed)
I have seen and examined this patient and agree with the plan of care. Plan dialysis in AM  Garnetta Buddy 10/24/2013, 8:13 PM

## 2013-10-25 NOTE — H&P (Signed)
This patient left AMA before I could see him.

## 2013-10-26 NOTE — Discharge Summary (Signed)
PATIENT LEFT AGAINST MEDICAL ADVICE  Name: [name] MRN: [id number] DOB: 1966/03/28 48 y.o. PCP: Pcp Not In System  Date of Admission: 10/24/2013  9:33 AM Date of Discharge: 10/24/2013 Attending Physician: Aletta Edouard, MD  Diagnosis:  Primary: Acute on Chronic Hypotension   ESRD on HD  Chronic Low Back Pain Normocytic Anemia  Left Sacral Wound Ulcer Depression  OSA   Morbid Obesity  GERD DVT Prophylaxis    Discharge Medications: Pt left AMA   Pt left AMA Disposition and follow-up:   [name] left AMA  Follow-up Appointments: Pt left AMA.  Discharge Instructions: Pt left AMA.   Consultations: Treatment Team:  Garnetta Buddy, MD  Procedures Performed:   Dg Chest Portable 1 View  10/24/2013   CLINICAL DATA:  Hypertension, pain.  EXAM: PORTABLE CHEST - 1 VIEW  COMPARISON:  10/14/2013  FINDINGS: Cardiomegaly. Continued density at the left base/lingula most compatible with scarring or atelectasis. Right lung is clear. Mild vascular congestion. This is improved slightly since prior study. No visible effusions. No acute bony abnormality.  IMPRESSION: Stable density at the left lung base, likely scarring or atelectasis.  Cardiomegaly, improving vascular congestion   Electronically Signed   By: Charlett Nose M.D.   On: 10/24/2013 10:28    2D Echo: None  Cardiac Cath: None  Admission HPI:  Original Author Otis Brace, MD  48 year old man with a past medical history of end-stage renal disease on HD (M./W./F.), s/p of gastric sleeve surgery with complications, left foot drop, chronic left hip pain, possible AI, who presents with hypotension.   Recently, the patient did have HD regularly His last dialysis was on Friday, which was prematurely discontinued after 3 hours due to low blood pressure. His body weight increased by more than 20 pounds per patient. He went to the [place] [facility] for trying to  remove some fluid this morning. During the process of preparation for dialysis, patient was found to have low blood pressure. His blood pressure drop to about SBP of 70 mmHg. HD could not be done. He was transferred to [facility] hospital emergency room for further evaluation and treatment. When the patient was evaluated in the ED, he has mild dizziness and mild nausea. Patient does not have confusion and answered all questions appropriately. He denies chest pain, shortness of breath, vomiting and diarrhea. Patient was supposed to take Midodrine 10 milligram 3 times a day at home, but the patient is not very sure whether he has been taking his medications.   Of note, he had a history of gastric sleeve surgery with a lot of complications, including gallstone, possible cholecystitis, and Bear trap placement, but he does not have abdominal pain today. No fever or chills.   Hospital Course by problem list:    Acute on Chronic Hypotension - Pt with recent volume overload requiring extra outpatient HD session on day of admission that could not be performed due to hypotension. BP on admission to ED was 89/40 with low of 59/38. Pt with chronic hypotension with baseline blood pressure 90/40's. Pt's home midodrine was recently increased to 10 mg TID which he was unsure if he was taking properly. Etiology most likely due to  noncompliance with home midodrine, as there was no concern for septic, cardiogenic, or hypovolemic shock. Lactic acid and WBC were within normal limits. Pt with unclear history of adrenal insufficiency with no hyponatremia, hyperkalemia, or hypercalcemia on admission and last cortisol level on 03/24/13 that was normal (24.1). Pt reported mild lightheadedness on admission without recent syncope. Pt received IV solucortef 50 mg x 1, PO midodrine 10 mg x 1, and IV phenylephrine 10 mg x 3. Pt was monitored on telemetry and 12-lead EKG did not reveal ischemic changes. Pt's hypotension improved and was 98/46  when pt left against medical advice.   ESRD on HD - Pt on MWFschedule since 2010 with recent volume overload requiring extra outpatient HD session on day of admission that could not be performed due to hypotension. CXR on 5/28 revealed improving vascular congestion compared to CXR on 5/17. Pt at home on phoslo 1334-2001 mg TID with meals, renelva 2400 mg TID, and sensipar 20 mg daily which were to be continued during hospitalization but pt left against medical advice before they could be administered. Pt was seen by nephrology who was to perform HD on following day but pt left against medical advice.    Chronic Low Back Pain - Pt with chronic low back pain at baseline and requested IV dilaudid. Pt received 0.5 IV dilaudid x 1 dose during hospitalization.     Normocytic Anemia - Pt with Hg near baseline of 10 with no active bleeding. Last anemia panel on 04/02/13 with ferritin 512 (H), iron 50, TIBC 237, and iron sat 215 consistent with anemia of chronic disease (ESRD).    Left Sacral Wound Ulcer - Pt with reported left sacral wound ulcer without purulent drainage. Due to pt's morbid obesity, wound could not be assessed. Pt reported recently prescribed keflex but had not taken it. Pt was to be seen by wound care but left against medical advice.   Depression - Pt with stable mood without suicide ideation during hospitalization. Pt at home on lexapro 10 mg daily and trazodone 50 mg daily which were to be continued during hospitalization but pt left against medical advice before they could be administered.   OSA - Pt reported compliance with CPAP at home. Pt was to receive CPAP while hospitalized but left against medical advice.   Morbid Obesity - Pt is status post gastric sleeve surgery in 2010 with multiple complications with weight on admission of 340 lb.   GERD - Pt with no acid reflux symptoms during hospitalizaiton. Pt at home on prilosec 20 mg BID. Pt was to receive protonix 40 mg daily but pt left  against medical advice before it could be administered.   DVT Prophylaxis - Pt with prior history of DVT and IVC filter to receive SQ heparin during hospitalization but pt left against medical advice before it could be administered.    Discharge Vitals:   BP 98/46  Pulse 74  Temp(Src) 98 F (36.7 C) (Oral)  Resp 20  SpO2 98%  Discharge Labs:  No results found for this or any previous visit (from the past 24 hour(s)).  Signed: Otis BraceMarjan Arneda Sappington, MD 10/26/2013, 3:15 PM   Time Spent on Discharge:  Pt left AMA Services Ordered on Discharge: Pt left AMA   Equipment Ordered on Discharge: Pt left AMA

## 2013-10-28 NOTE — Discharge Summary (Signed)
Reviewed. Agree with documentation. Patient left AMA, I never saw the patient.

## 2013-10-28 NOTE — ED Provider Notes (Signed)
CSN: [id number]     Arrival date & time 10/24/13  [id number] History   First MD Initiated Contact with Patient 10/24/13 [phone]     Chief Complaint  Patient presents with  . Hypotension  . Pain      HPI To ED via [facility] from D[facility] [facility]-- after having an episode of hypotension, received 500cc bolus at center- without response. Dialysis M, W, F-- was there today to take "off 10 pounds" -- dry weight =344#, weight today 364#.  Past Medical History  Diagnosis Date  . GERD (gastroesophageal reflux disease)   . DVT (deep venous thrombosis)   . Complication of anesthesia     " I am hard  to wake up "  . ESRD on hemodialysis     Dialysis at [facility] in [place], [place]. Started dialysis in 2010. ESRD due to DM/HTN.  . Diabetes mellitus   . ESRD (end stage renal disease) on dialysis 08/24/2011    esrd gets hd in [place] with [facility] on tts schedule, started dialysis in 2010    Past Surgical History  Procedure Laterality Date  . Gastric sleeve    . Kidney stones    . Dialysis port    . Compression hip screw  08/26/2011    Procedure: COMPRESSION HIP;  Surgeon: Velna OchsPeter G Dalldorf, MD;  Location: [facility] OR;  Service: Orthopedics;  Laterality: Left;  DYNAMIC HIP SCREW COMPRESSION  . Ivc filter    . Cholecystostomy tube     No family history on file. History  Substance Use Topics  . Smoking status: Current Every Day Smoker -- 1.00 packs/day for 20 years    Types: Cigarettes  . Smokeless tobacco: Never Used  . Alcohol Use: No    Review of Systems  All other systems reviewed and are negative  Allergies  Piperacillin sod-tazobactam so  Home Medications   Prior to Admission medications   Medication Sig Start Date End Date Taking? Authorizing Provider  albuterol (PROVENTIL HFA;VENTOLIN HFA) 108 (90 BASE) MCG/ACT inhaler Inhale 2 puffs into the lungs every 6 (six) hours as needed for wheezing or shortness of breath. 04/23/13  Yes Oretha Milchakesh Alva V, MD  calcium  acetate (PHOSLO) 667 MG capsule Take 1,334-2,001 mg by mouth See admin instructions. Three tablets with all meals and two tablets with snacks   Yes Historical Provider, MD  cephALEXin (KEFLEX) 250 MG capsule Take 250 mg by mouth 2 (two) times daily. For 10 days 10/22/13  Yes Historical Provider, MD  cinacalcet (SENSIPAR) 30 MG tablet Take 30 mg by mouth daily.   Yes Historical Provider, MD  clonazePAM (KLONOPIN) 0.5 MG tablet Take 0.5 mg by mouth 2 (two) times daily as needed for anxiety.  10/10/13  Yes Historical Provider, MD  escitalopram (LEXAPRO) 20 MG tablet Take 10 mg by mouth daily.    Yes Historical Provider, MD  folic acid (FOLVITE) 1 MG tablet Take 1 mg by mouth daily.   Yes Historical Provider, MD  gabapentin (NEURONTIN) 100 MG capsule Take 100 mg by mouth 2 (two) times daily.    Yes Historical Provider, MD  midodrine (PROAMATINE) 10 MG tablet Take 10 mg by mouth 3 (three) times daily.    Yes Historical Provider, MD  multivitamin (RENA-VIT) TABS tablet Take 1 tablet by mouth daily.   Yes Historical Provider, MD  omeprazole (PRILOSEC) 20 MG capsule Take 20 mg by mouth 2 (two) times daily.   Yes Historical Provider, MD  promethazine (PHENERGAN)  25 MG tablet Take 25 mg by mouth every 8 (eight) hours as needed for nausea.  08/23/13  Yes Historical Provider, MD  RENVELA 800 MG tablet Take 2,400 mg by mouth 3 (three) times daily. 10/02/13  Yes Historical Provider, MD  senna (SENOKOT) 8.6 MG TABS tablet Take 1 tablet by mouth daily.   Yes Historical Provider, MD  traZODone (DESYREL) 50 MG tablet Take 50 mg by mouth at bedtime. 10/10/13  Yes Historical Provider, MD   BP 98/46  Pulse 74  Temp(Src) 98 F (36.7 C) (Oral)  Resp 20  SpO2 98% Physical Exam  Nursing note and vitals reviewed. Constitutional: He is oriented to person, place, and time. He appears well-developed and well-nourished. No distress.  HENT:  Head: Normocephalic and atraumatic.  Eyes: Pupils are equal, round, and reactive to  light.  Neck: Normal range of motion.  Cardiovascular: Normal rate and intact distal pulses.   Pulmonary/Chest: No respiratory distress.  Abdominal: Normal appearance. He exhibits no distension.  Musculoskeletal: Normal range of motion.  Neurological: He is alert and oriented to person, place, and time. No cranial nerve deficit.  Skin: Skin is warm and dry. No rash noted.  Psychiatric: He has a normal mood and affect. His behavior is normal.    ED Course  Procedures (including critical care time) Labs Review Labs Reviewed  BASIC METABOLIC PANEL - Abnormal; Notable for the following:    Glucose, Bld 101 (*)    BUN 43 (*)    Creatinine, Ser 4.75 (*)    GFR calc non Af Amer 14 (*)    GFR calc Af Amer 16 (*)    All other components within normal limits  CBC - Abnormal; Notable for the following:    WBC 11.1 (*)    RBC 3.22 (*)    Hemoglobin 10.1 (*)    HCT 32.7 (*)    MCV 101.6 (*)    RDW 16.5 (*)    All other components within normal limits  COMPREHENSIVE METABOLIC PANEL - Abnormal; Notable for the following:    BUN 42 (*)    Creatinine, Ser 4.70 (*)    Albumin 3.1 (*)    Alkaline Phosphatase 272 (*)    GFR calc non Af Amer 14 (*)    GFR calc Af Amer 16 (*)    All other components within normal limits  LACTIC ACID, PLASMA    Imaging Review IMPRESSION: Stable density at the left lung base, likely scarring or atelectasis.  Cardiomegaly, improving vascular congestion    EKG Interpretation   Date/Time:  Thursday Oct 24 2013 09:53:21 EDT Ventricular Rate:  86 PR Interval:  180 QRS Duration: 89 QT Interval:  394 QTC Calculation: 471 R Axis:   73 Text Interpretation:  Sinus rhythm Low voltage, precordial leads Probable  lateral infarct, age indeterminate Abnrm T, consider ischemia,  anterolateral lds ED PHYSICIAN INTERPRETATION AVAILABLE IN [facility]  Confirmed by TEST, Record ([id number]) on 10/26/2013 10:47:24 AM     Plan on admitting the patient to the  hospital for stabilization but he apparently left AMA after he was not given pain medicine.  Patient during my exam had mental capacity to make decisions.  Patient had no homicidal or suicidal thoughts or ideations. MDM   Final diagnoses:  Hypotension  ESRD (end stage renal disease)        Nelia Shi, MD 10/28/13 [phone]

## 2014-01-28 DEATH — deceased
